# Patient Record
Sex: Male | Born: 1955 | Race: White | Hispanic: No | Smoking: Current every day smoker
Health system: Southern US, Community
[De-identification: ages and names within clinical notes are randomized; demographics above are authoritative.]

## PROBLEM LIST (undated history)

## (undated) DIAGNOSIS — E7800 Pure hypercholesterolemia, unspecified: Secondary | ICD-10-CM

## (undated) DIAGNOSIS — R768 Other specified abnormal immunological findings in serum: Secondary | ICD-10-CM

## (undated) DIAGNOSIS — I480 Paroxysmal atrial fibrillation: Secondary | ICD-10-CM

## (undated) DIAGNOSIS — I2510 Atherosclerotic heart disease of native coronary artery without angina pectoris: Secondary | ICD-10-CM

## (undated) DIAGNOSIS — F1010 Alcohol abuse, uncomplicated: Secondary | ICD-10-CM

## (undated) DIAGNOSIS — R0603 Acute respiratory distress: Secondary | ICD-10-CM

## (undated) DIAGNOSIS — F1410 Cocaine abuse, uncomplicated: Secondary | ICD-10-CM

## (undated) DIAGNOSIS — Z720 Tobacco use: Secondary | ICD-10-CM

## (undated) DIAGNOSIS — I4891 Unspecified atrial fibrillation: Secondary | ICD-10-CM

## (undated) DIAGNOSIS — I10 Essential (primary) hypertension: Secondary | ICD-10-CM

## (undated) DIAGNOSIS — E119 Type 2 diabetes mellitus without complications: Secondary | ICD-10-CM

## (undated) HISTORY — DX: Atherosclerotic heart disease of native coronary artery without angina pectoris: I25.10

## (undated) HISTORY — PX: APPENDECTOMY: SHX54

## (undated) HISTORY — DX: Pure hypercholesterolemia, unspecified: E78.00

## (undated) HISTORY — DX: Paroxysmal atrial fibrillation: I48.0

## (undated) HISTORY — DX: Type 2 diabetes mellitus without complications: E11.9

## (undated) HISTORY — DX: Essential (primary) hypertension: I10

## (undated) HISTORY — PX: FOOT SURGERY: SHX648

---

## 1998-11-18 ENCOUNTER — Emergency Department (HOSPITAL_COMMUNITY): Admission: EM | Admit: 1998-11-18 | Discharge: 1998-11-18 | Payer: Self-pay | Admitting: Emergency Medicine

## 1998-11-18 ENCOUNTER — Encounter: Payer: Self-pay | Admitting: Emergency Medicine

## 1998-11-28 ENCOUNTER — Emergency Department (HOSPITAL_COMMUNITY): Admission: EM | Admit: 1998-11-28 | Discharge: 1998-11-28 | Payer: Self-pay | Admitting: Emergency Medicine

## 2002-08-26 ENCOUNTER — Emergency Department (HOSPITAL_COMMUNITY): Admission: EM | Admit: 2002-08-26 | Discharge: 2002-08-26 | Payer: Self-pay | Admitting: Emergency Medicine

## 2002-08-26 ENCOUNTER — Encounter: Payer: Self-pay | Admitting: Emergency Medicine

## 2004-05-20 ENCOUNTER — Emergency Department (HOSPITAL_COMMUNITY): Admission: EM | Admit: 2004-05-20 | Discharge: 2004-05-20 | Payer: Self-pay | Admitting: Emergency Medicine

## 2004-11-29 ENCOUNTER — Emergency Department (HOSPITAL_COMMUNITY): Admission: EM | Admit: 2004-11-29 | Discharge: 2004-11-29 | Payer: Self-pay | Admitting: Emergency Medicine

## 2006-01-12 ENCOUNTER — Inpatient Hospital Stay (HOSPITAL_COMMUNITY): Admission: AC | Admit: 2006-01-12 | Discharge: 2006-01-15 | Payer: Self-pay

## 2006-11-09 ENCOUNTER — Emergency Department (HOSPITAL_COMMUNITY): Admission: EM | Admit: 2006-11-09 | Discharge: 2006-11-09 | Payer: Self-pay | Admitting: Emergency Medicine

## 2007-01-17 ENCOUNTER — Inpatient Hospital Stay (HOSPITAL_COMMUNITY): Admission: EM | Admit: 2007-01-17 | Discharge: 2007-01-19 | Payer: Self-pay | Admitting: Emergency Medicine

## 2007-01-17 ENCOUNTER — Ambulatory Visit: Payer: Self-pay | Admitting: *Deleted

## 2007-07-20 ENCOUNTER — Emergency Department (HOSPITAL_COMMUNITY): Admission: EM | Admit: 2007-07-20 | Discharge: 2007-07-20 | Payer: Self-pay | Admitting: Emergency Medicine

## 2007-08-28 ENCOUNTER — Emergency Department (HOSPITAL_COMMUNITY): Admission: EM | Admit: 2007-08-28 | Discharge: 2007-08-28 | Payer: Self-pay | Admitting: Emergency Medicine

## 2008-07-27 ENCOUNTER — Emergency Department (HOSPITAL_COMMUNITY): Admission: EM | Admit: 2008-07-27 | Discharge: 2008-07-27 | Payer: Self-pay | Admitting: Emergency Medicine

## 2008-08-29 ENCOUNTER — Encounter: Payer: Self-pay | Admitting: Cardiology

## 2008-10-12 ENCOUNTER — Emergency Department (HOSPITAL_COMMUNITY): Admission: EM | Admit: 2008-10-12 | Discharge: 2008-10-13 | Payer: Self-pay | Admitting: *Deleted

## 2008-10-14 ENCOUNTER — Ambulatory Visit (HOSPITAL_COMMUNITY): Admission: RE | Admit: 2008-10-14 | Discharge: 2008-10-14 | Payer: Self-pay | Admitting: Orthopedic Surgery

## 2008-12-02 ENCOUNTER — Observation Stay (HOSPITAL_COMMUNITY): Admission: EM | Admit: 2008-12-02 | Discharge: 2008-12-03 | Payer: Self-pay | Admitting: Emergency Medicine

## 2008-12-02 ENCOUNTER — Encounter (INDEPENDENT_AMBULATORY_CARE_PROVIDER_SITE_OTHER): Payer: Self-pay | Admitting: Internal Medicine

## 2010-07-07 ENCOUNTER — Emergency Department (HOSPITAL_COMMUNITY): Payer: Medicaid Other

## 2010-07-07 ENCOUNTER — Emergency Department (HOSPITAL_COMMUNITY)
Admission: EM | Admit: 2010-07-07 | Discharge: 2010-07-07 | Disposition: A | Discharge status: Home / Self Care | Payer: Medicaid Other | Attending: Emergency Medicine | Admitting: Emergency Medicine

## 2010-07-07 DIAGNOSIS — F1410 Cocaine abuse, uncomplicated: Secondary | ICD-10-CM | POA: Insufficient documentation

## 2010-07-07 DIAGNOSIS — R059 Cough, unspecified: Secondary | ICD-10-CM | POA: Insufficient documentation

## 2010-07-07 DIAGNOSIS — I10 Essential (primary) hypertension: Secondary | ICD-10-CM | POA: Insufficient documentation

## 2010-07-07 DIAGNOSIS — E7800 Pure hypercholesterolemia, unspecified: Secondary | ICD-10-CM | POA: Insufficient documentation

## 2010-07-07 DIAGNOSIS — I2510 Atherosclerotic heart disease of native coronary artery without angina pectoris: Secondary | ICD-10-CM | POA: Insufficient documentation

## 2010-07-07 DIAGNOSIS — R079 Chest pain, unspecified: Secondary | ICD-10-CM | POA: Insufficient documentation

## 2010-07-07 DIAGNOSIS — F1010 Alcohol abuse, uncomplicated: Secondary | ICD-10-CM | POA: Insufficient documentation

## 2010-07-07 DIAGNOSIS — R05 Cough: Secondary | ICD-10-CM | POA: Insufficient documentation

## 2010-07-07 DIAGNOSIS — F411 Generalized anxiety disorder: Secondary | ICD-10-CM | POA: Insufficient documentation

## 2010-07-07 DIAGNOSIS — F17200 Nicotine dependence, unspecified, uncomplicated: Secondary | ICD-10-CM | POA: Insufficient documentation

## 2010-07-07 LAB — CK TOTAL AND CKMB (NOT AT ARMC): Total CK: 78 U/L (ref 7–232)

## 2010-07-07 LAB — CBC
HCT: 44.8 % (ref 39.0–52.0)
MCH: 33.6 pg (ref 26.0–34.0)
MCHC: 34.2 g/dL (ref 30.0–36.0)
MCV: 98.2 fL (ref 78.0–100.0)
Platelets: 175 10*3/uL (ref 150–400)
RDW: 13.5 % (ref 11.5–15.5)

## 2010-07-07 LAB — COMPREHENSIVE METABOLIC PANEL
ALT: 216 U/L — ABNORMAL HIGH (ref 0–53)
AST: 166 U/L — ABNORMAL HIGH (ref 0–37)
CO2: 23 mEq/L (ref 19–32)
Calcium: 8.4 mg/dL (ref 8.4–10.5)
Creatinine, Ser: 0.9 mg/dL (ref 0.4–1.5)
GFR calc non Af Amer: >60 mL/min (ref >60)
Glucose, Bld: 91 mg/dL (ref 70–99)
Sodium: 133 mEq/L — ABNORMAL LOW (ref 135–145)
Total Protein: 7.6 g/dL (ref 6.0–8.3)

## 2010-07-07 LAB — RAPID URINE DRUG SCREEN, HOSP PERFORMED

## 2010-07-07 LAB — ETHANOL

## 2010-07-28 LAB — BASIC METABOLIC PANEL
BUN: 6 mg/dL (ref 6–23)
Calcium: 8.7 mg/dL (ref 8.4–10.5)
Chloride: 101 mEq/L (ref 96–112)
Chloride: 109 mEq/L (ref 96–112)
Creatinine, Ser: 0.74 mg/dL (ref 0.4–1.5)
GFR calc Af Amer: >60 mL/min (ref >60)
GFR calc non Af Amer: >60 mL/min (ref >60)
GFR calc non Af Amer: >60 mL/min (ref >60)
Potassium: 3.3 mEq/L — ABNORMAL LOW (ref 3.5–5.1)
Sodium: 133 mEq/L — ABNORMAL LOW (ref 135–145)

## 2010-07-28 LAB — DIFFERENTIAL
Eosinophils Relative: 2 % (ref 0–5)
Lymphocytes Relative: 55 % — ABNORMAL HIGH (ref 12–46)
Lymphs Abs: 3.7 10*3/uL (ref 0.7–4.0)
Monocytes Absolute: 0.7 10*3/uL (ref 0.1–1.0)
Monocytes Relative: 11 % (ref 3–12)
Neutro Abs: 2.2 10*3/uL (ref 1.7–7.7)

## 2010-07-28 LAB — HEPATIC FUNCTION PANEL
AST: 190 U/L — ABNORMAL HIGH (ref 0–37)
Albumin: 3.3 g/dL — ABNORMAL LOW (ref 3.5–5.2)
Alkaline Phosphatase: 61 U/L (ref 39–117)
Total Bilirubin: 0.5 mg/dL (ref 0.3–1.2)

## 2010-07-28 LAB — CARDIAC PANEL(CRET KIN+CKTOT+MB+TROPI)
CK, MB: 0.7 ng/mL (ref 0.3–4.0)
CK, MB: 1 ng/mL (ref 0.3–4.0)
Relative Index: INVALID (ref 0.0–2.5)
Total CK: 54 U/L (ref 7–232)
Total CK: 68 U/L (ref 7–232)
Troponin I: 0.01 ng/mL (ref 0.00–0.06)

## 2010-07-28 LAB — LIPID PANEL
HDL: 24 mg/dL — ABNORMAL LOW (ref >39)
Triglycerides: 70 mg/dL (ref <150)
VLDL: 14 mg/dL (ref 0–40)

## 2010-07-28 LAB — HEPATITIS A ANTIBODY, IGM: Hep A IgM: NEGATIVE

## 2010-07-28 LAB — POCT CARDIAC MARKERS: Troponin i, poc: <0.05 ng/mL (ref 0.00–0.09)

## 2010-07-28 LAB — CBC
HCT: 38.3 % — ABNORMAL LOW (ref 39.0–52.0)
Hemoglobin: 13 g/dL (ref 13.0–17.0)
RBC: 3.81 MIL/uL — ABNORMAL LOW (ref 4.22–5.81)

## 2010-07-28 LAB — TSH: TSH: 1.834 u[IU]/mL (ref 0.350–4.500)

## 2010-07-28 LAB — RAPID URINE DRUG SCREEN, HOSP PERFORMED: Cocaine: NOT DETECTED

## 2010-07-28 LAB — HEPATITIS B SURFACE ANTIBODY,QUALITATIVE: Hep B S Ab: NEGATIVE

## 2010-07-30 LAB — BASIC METABOLIC PANEL
BUN: 11 mg/dL (ref 6–23)
CO2: 23 mEq/L (ref 19–32)
Calcium: 9.2 mg/dL (ref 8.4–10.5)
Chloride: 110 mEq/L (ref 96–112)
Creatinine, Ser: 0.7 mg/dL (ref 0.4–1.5)
GFR calc Af Amer: >60 mL/min (ref >60)
GFR calc non Af Amer: >60 mL/min (ref >60)
Glucose, Bld: 110 mg/dL — ABNORMAL HIGH (ref 70–99)
Potassium: 4.2 mEq/L (ref 3.5–5.1)
Sodium: 139 mEq/L (ref 135–145)

## 2010-07-30 LAB — CBC
HCT: 40.1 % (ref 39.0–52.0)
Hemoglobin: 13.6 g/dL (ref 13.0–17.0)
MCHC: 34 g/dL (ref 30.0–36.0)
MCV: 99.5 fL (ref 78.0–100.0)
Platelets: 144 10*3/uL — ABNORMAL LOW (ref 150–400)
RBC: 4.03 MIL/uL — ABNORMAL LOW (ref 4.22–5.81)
RDW: 12.5 % (ref 11.5–15.5)
WBC: 6.1 10*3/uL (ref 4.0–10.5)

## 2010-08-01 LAB — CBC
Hemoglobin: 14.4 g/dL (ref 13.0–17.0)
MCHC: 35 g/dL (ref 30.0–36.0)
MCV: 99.9 fL (ref 78.0–100.0)
RBC: 4.13 MIL/uL — ABNORMAL LOW (ref 4.22–5.81)
WBC: 4.4 10*3/uL (ref 4.0–10.5)

## 2010-08-01 LAB — POCT CARDIAC MARKERS
CKMB, poc: <1 ng/mL — ABNORMAL LOW (ref 1.0–8.0)
CKMB, poc: <1 ng/mL — ABNORMAL LOW (ref 1.0–8.0)
Myoglobin, poc: 30.6 ng/mL (ref 12–200)

## 2010-08-01 LAB — DIFFERENTIAL
Basophils Relative: 1 % (ref 0–1)
Eosinophils Absolute: 0.1 10*3/uL (ref 0.0–0.7)
Lymphs Abs: 2.2 10*3/uL (ref 0.7–4.0)
Monocytes Absolute: 0.4 10*3/uL (ref 0.1–1.0)
Monocytes Relative: 10 % (ref 3–12)

## 2010-09-04 NOTE — H&P (Signed)
NAMEWILMAR, Kevin Orozco              ACCOUNT NO.:  [id number]   MEDICAL RECORD NO.:  [id number]          PATIENT TYPE:  INP   LOCATION:  3708                         FACILITY:  [facility]   PHYSICIAN:  Rod Holler, MD     DATE OF BIRTH:  [birth year]   DATE OF ADMISSION:  01/17/2007  DATE OF DISCHARGE:                              HISTORY & PHYSICAL   CHIEF COMPLAINT:  Chest pain.   HISTORY OF PRESENT ILLNESS:  [name] is a 55 year old male who does  not see a doctor on a regular basis, who presents to the emergency  department with chest pain.  Last night at approximately 2 a.m., the  patient had onset of left-sided chest pain and pressure.  He has had  intermittent discomfort since that time.  There is associated nausea,  diaphoresis, shortness of breath, and radiation to his jaw and left  upper extremity.  He has had no syncope or presyncope but does complain  of some palpitations.  He has no complaints of PND or orthopnea, no  lower extremity swelling.  He was seen in the emergency department a  couple of months ago with similar symptoms but left the hospital AMA.  Currently, he is chest pain-free.   PAST MEDICAL HISTORY:  None.   MEDICATIONS:  None.   ALLERGIES:  NONE.   SOCIAL HISTORY:  The patient smokes 2-3 packs per day and drinks a  couple of 40-ounce beers per day and works in [employer].   FAMILY HISTORY:  Father died in his 31s of an MI.  Mother died in her  86s from complications of diabetes mellitus.   REVIEW OF SYSTEMS:  All systems reviewed in detail and are negative  except as noted in the history of present illness.   PHYSICAL EXAMINATION:  VITAL SIGNS:  Temperature 97.9, heart rate 93,  respiratory rate 20, blood pressure 128/78, oxygen saturation 100%.  GENERAL:  Well-developed, well-nourished male, alert and oriented x3, no  apparent distress.  HEENT: Atraumatic, normocephalic.  Pupils equal, round, and reactive to  light.  Extraocular movements  intact.  Oropharynx clear.  NECK:  Supple.  No adenopathy, no JVD, no carotid bruits.  CHEST:  Lungs clear to auscultation bilaterally with equal breath  sounds.  CARDIAC:  Regular rhythm, normal rate, normal S1-S2.  No murmurs, rubs  or gallops.  Distant heart sounds.  ABDOMEN:  Soft, nontender, nondistended.  Active bowel sounds.  No  splenomegaly.  EXTREMITIES:  No cyanosis or edema, mild clubbing.  NEUROLOGIC:  No focal deficits.   LABORATORY DATA:  Sodium 139, potassium 3.9, chloride 107, bicarb 18,  BUN 8, creatinine 0.7, glucose 89.  White blood cell count 7.8,  hematocrit 40.8, platelet count 158.  Myoglobin 47, 40; troponin less  than 0.05, less than 0.05; CK-MB less than one, less than one; INR 152,  29;  magnesium 2.1.   EKG shows normal sinus rhythm, possible left atrial enlargement, lateral  T wave inversion that is old, no change from previous.  Chest x-ray  shows mild cardiomegaly.   IMPRESSION:  [name] is a 55 year old  male who does not see a  physician on a regular basis who presents with chest pain.   PLAN:  1. Cardiovascular.  Admit the patient to a telemetry bed, aspirin      daily, Lipitor daily, lisinopril daily, Lopressor b.i.d., continue      nitroglycerin drip, heparin bolus and drip, daily EKGs, rule out      with serial cardiac enzymes, BNP in the morning, lipid panel in the      morning.  2. Will place the patient on alcohol withdrawal prophylaxis protocol.  3. Fluids, electrolytes, nutrition:  Cardiac diet, CMP and magnesium      in the morning.  4. Endocrine.  Thyroid function tests in the morning.  5. Hematologic.  CBC in the morning, guaiac all stools.  6. GI:  Place the patient on a PPI.  7. Tobacco cessation consult.      Rod Holler, MD  Electronically Signed     TRK/MEDQ  D:  01/17/2007  T:  01/18/2007  Job:  [id number]
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
## 2010-09-04 NOTE — Discharge Summary (Signed)
Kevin Orozco, Kevin Orozco NO.:  [id number]   MEDICAL RECORD NO.:  [id number]          PATIENT TYPE:  OBV   LOCATION:  2028                         FACILITY:  [facility]   PHYSICIAN:  Isidor Holts, M.D.  DATE OF BIRTH:  05/01/1955   DATE OF ADMISSION:  12/02/2008  DATE OF DISCHARGE:  12/03/2008                               DISCHARGE SUMMARY   PRIMARY CARE PHYSICIAN:  Dr. Ardyth Harps, [place], [place].   PRIMARY CARDIOLOGIST:  Dr. Peter Swaziland.   DISCHARGE DIAGNOSES:  1. Chest pain, atypical.  2. History of nonobstructive coronary artery disease, per cardiac      catheterization 12/2006.  3. Status post negative stress Myoview 08/2008.  4. Hypertension.  5. Dyslipidemia.  6. Smoking history.  7. History of alcohol excess.   DISCHARGE MEDICATIONS:  1. Simvastatin 10 mg p.o. q.h.s.  2. Ambient 10 mg p.o. p.r.n. q.h.s. for insomnia.  3. Klonopine 0.5 mg p.o. p.r.n. b.i.d.  4. Nitroglycerin 0.4 mg sublingually p.r.n. q. 5 minutes for chest      pain.  5. Prilosec 20 mg p.o. daily.  6. Lopressor 12.5 mg p.o. t.i.d.  7. Thiamine 100 mg p.o. daily.   Note:  Lisinopril has been discontinued, until reevaluated by primary  MD.   PROCEDURES:  1. Chest x-ray done December 02, 2008.  This showed no acute      cardiopulmonary process.  2. 2-D echocardiogram done December 02, 2008.  This showed normal left      ventricular cavity size, wall thickness was increased in a pattern      of moderate left ventricular hypertrophy, systolic function was      normal.  Estimated ejection fraction was in the range of 60% to      65%.  Wall motion was normal.  There were no regional wall motion      abnormalities.  Left ventricular diastolic function parameters were      normal.  There was mild mitral valvular regurgitation.  The left      atrium was mildly dilated.   CONSULTATIONS:  Dr. Peter Swaziland, cardiologist.   /ADMISSION HISTORY/>  As in H and P notes of  December 02, 2008, dictated by Dr. Vania Rea.  However, in brief, this is a 55 year old male, with known  history of hypertension, dyslipidemia, nonobstructive coronary artery  disease documented on cardiac catheterization January 18, 2009 by Dr.  Arvilla Meres, which showed a 40-50% lesion in the ostial LAD, 30%  lesion, mid LAD as well as 30% lesion in second marginal branch, also  status post negative stress Myoview done 08/2008 by Dr. Peter Swaziland,  smoking history, and history of alcohol excess, presenting with at least  two episodes of chest pain, described as left-sided, also radiating to  the jaw, partially relieved by sublingual Nitroglycerin.  According to  him, these episodes were associated with shortness of breath.  He was  admitted for further evaluation, investigation and management.   CLINICAL COURSE:  1. Chest pain.  This had some atypical features, however, the      patient's description was  suspicious for coronary artery disease.      Cardiac enzymes were cycled and remained unelevated.  A 12-lead EKG      showed no acute ischemic changes.  2-D echocardiogram was done,      which showed no evidence of regional wall motion abnormalities.      Cardiology consultation was called, which was kindly provided by      Dr. Peter Swaziland.  For details of the consultation, refer to      consultation notes of December 02, 2008.  After reviewing the data,      he felt that there was no objective evidence of ischemia and that      the patient's chest pain was likely noncardiac.  He  recommended      utilization of a proton pump inhibitor.  This was instituted      accordingly, and throughout the rest of the course of his      hospitalization, the patient had no relapse of chest pain.   1. Dyslipidemia.  The patient's lipid profile was as follows:  Total      cholesterol 76, triglyceride 70, HDL 24, LDL 38 i.e. excellent      lipid profile.  He has been reassured  accordingly.   1. Hypertension.  The patient was normotensive throughout the course      of his hospitalization.  However, in view of presenting symptoms he      was placed on a low-dose beta blocker.  Lisinopril has been      discontinued, until reevaluated by his primary MD.   1. Smoking history.  The patient does smoke approximately 1 to 1-1/2      packets of cigarettes per day.  He had been counseled      appropriately.  He was managed during the course of this      hospitalization, with Nicoderm CQ patch.   1. Alcohol excess.  The patient states that he drinks only a couple of      beers per day, however, at the time of presentation, his alcohol      level was 42, suggestive of acute intoxication.  He was also noted      to have a red cell macrocytosis of 100.6 and a mild transaminitis      with alkaline phosphatase 61, AST 190, ALT 183.  These phenomena      were deemed likely secondary to alcohol abuse.  Viral hepatitis      serologies were done, and were negative.  The patient has been      counseled appropriately.  However, during the course of his      hospitalization, he showed no evidence of alcohol withdrawal      phenomena.   DISPOSITION:  The patient was on December 03, 2008 asymptomatic and very  keen to be discharged.  There were no new issues.  He was therefore  considered clinically stable for discharge, and discharged accordingly.   DIET:  Heart-healthy.   ACTIVITY:  As tolerated.   FOLLOW-UP INSTRUCTIONS:  The patient is to follow up routinely with his  primary MD, Dr. Ardyth Harps, in [place], [place],  telephone number [phone].  An appointment has been made for December 12, 2008 at 10:15 a.m.      Isidor Holts, M.D.  Electronically Signed     CO/MEDQ  D:  12/03/2008  T:  12/03/2008  Job:  [id number]   cc:   Peter M. Swaziland, M.D.  Ardyth Harps, M.D.

## 2010-09-04 NOTE — Discharge Summary (Signed)
NAMECAPRICE, Kevin Orozco              ACCOUNT NO.:  [id number]   MEDICAL RECORD NO.:  [id number]          PATIENT TYPE:  INP   LOCATION:  3708                         FACILITY:  [facility]   PHYSICIAN:  Bevelyn Buckles. Bensimhon, MDDATE OF BIRTH:  03-07-1956   DATE OF ADMISSION:  01/17/2007  DATE OF DISCHARGE:  01/19/2007                               DISCHARGE SUMMARY   PRIMARY CARDIOLOGIST:  Dr. Nona Dell   PRIMARY CARE Charvi Gammage:  Patient does not have one   DISCHARGE DIAGNOSIS:  Chest pain.   SECONDARY DIAGNOSES:  1. Nonobstructive coronary artery disease.  2. Ongoing tobacco abuse.  3. ETOH abuse.  4. Family history of coronary artery disease.  5. Elevated liver function enzymes.   ALLERGIES:  No known drug allergies.   PROCEDURES:  Left heart cardiac catheterization.   HISTORY OF PRESENT ILLNESS:  A 55 year old Caucasian male without prior  medical history who does not see a physician on a regular basis.  At  2:00 a.m. on September 27, he had sudden onset of left-sided chest pain  and pressure which remained intermittent the remainder of the morning  and following day.  Because of ongoing symptoms, he presented to the  [facility] ED where ECG showed no acute changes and cardiac markers were  negative.  Symptoms were felt to be concerning for unstable angina and  he was admitted for further evaluation.   HOSPITAL COURSE:  Patient ruled out for MI.  He was initially placed on  statin therapy, however this was discontinued after his LDL was noted to  be normal at 68 with an elevated AST and ALT of 179 and 203,  respectively.  This was felt to be most likely secondary to ETOH abuse.  He was counseled on the importance of tobacco and alcohol cessation and  was also given Nicotine patch and DT prophylaxis.  Decision was made to  pursue cardiac catheterization which took place this morning revealing a  40 to 50% stenosis in the proximal LAD and otherwise nonobstructive  disease.  EF  was 65%.  He will be discharged home this afternoon on low-  dose aspirin and proton pump inhibitor therapy.  We recommended that he  establish followup with primary care locally and GI followup as well.   DISCHARGE LABS:  Hemoglobin 13.3, hematocrit 38.6, WBC 6.3, platelets  134, MCV 100.7.  Sodium 137, potassium 3.6, chloride 107, CO2 26, BUN 9,  creatinine 0.83, glucose 95.  Total bilirubin 1.0, alkaline phosphatase  50, AST 121, ALT 167, albumin 3.2.  CK 67, MB 1.0, troponin I 0.02.  Calcium 8.5, magnesium 1.9.  BNP less than 30.  TSH 1.058.   DISPOSITION:  Patient is being discharged home today in good condition.   FOLLOWUP PLANS AND APPOINTMENTS:  He is asked to establish primary care  as well as GI followup.   DISCHARGE MEDICATIONS:  1. Aspirin 81 mg q.d.  2. Prilosec OTC 20 mg b.i.d.   Patient has been counseled on the importance of cessation of all tobacco  and alcohol.   OUTSTANDING LABS/STUDIES:  None.   Duration of discharge  encounter 35 minutes including physician time.      Nicolasa Ducking, ANP      Bevelyn Buckles. Bensimhon, MD  Electronically Signed    CB/MEDQ  D:  01/19/2007  T:  01/19/2007  Job:  [id number]

## 2010-09-04 NOTE — H&P (Signed)
NAMEMarland Kitchen  Kevin Orozco, Kevin Orozco NO.:  [id number]   MEDICAL RECORD NO.:  [id number]          PATIENT TYPE:  OBV   LOCATION:  2028                         FACILITY:  [facility]   PHYSICIAN:  Vania Rea, M.D. DATE OF BIRTH:  October 30, 1955   DATE OF ADMISSION:  12/02/2008  DATE OF DISCHARGE:                              HISTORY & PHYSICAL   PRIMARY CARE PHYSICIAN:  Dr. Tomi Bamberger in [place].   CARDIOLOGIST:  Dr. Diona Browner with [facility] cardiology.   CHIEF COMPLAINT:  Chest pain.   HISTORY OF PRESENT ILLNESS:  This is a 55 year old Caucasian gentleman  with a history of tobacco and alcohol abuse and apparently a history of  hyperlipidemia who comes in complaining of chest pain.  The patient says  he has been having a similar chest pain episodically and in fact was  seen at this facility in September 2008, for a similar chest pain and at  that time was subjected to a cardiac catheterization which revealed  nonobstructive coronary disease, about 40-50% calcific stenosis of the  ostium of LAD with normal ejection fraction.  It was felt that his heart  was not the cause of his chest pains.  Since then, the patient has been  noncompliant with follow-up with a cardiologist, but has been seeing a  doctor in [place].  Currently, he is maintained on lisinopril and  simvastatin, as well as p.r.n. nitroglycerin.  The patient says he  started having central chest pressure yesterday afternoon.  It radiated  up into his jaw and the left side of his neck which became numb and was  partially relieved by sublingual nitroglycerin.  He came to the  emergency room and so far has received aspirin and has been resting most  of the time and says the pain is not gone completely, but it is very  mild.  The pain was associated with sweating and nausea, but he did not  get dizzy.  He was not syncopal.  He was not short of breath.  He has  been having no fever, cough or cold.  No lower extremity  edema.   PAST MEDICAL HISTORY:  1. Hypertension.  2. Hyperlipidemia.  3. Recurrent chest pains.  4. Nonobstructive coronary disease.  5. Tobacco abuse.  6. Alcohol abuse.   MEDICATIONS:  1. Lisinopril 10 mg daily.  2. Clonazepam 0.5 mg twice daily when necessary.  3. Ambien 10 mg at bedtime when necessary.  4. Simvastatin 10 mg at bedtime.  5. Nitroglycerin 0.4 mg sublingually p.r.n.   ALLERGIES:  NO KNOWN DRUG ALLERGIES.   SOCIAL HISTORY:  He smokes 1-1-1/2 packs of cigarettes per day.  He  drinks approximately two beers per day, he says his last alcohol use was  the day before yesterday.  He denies illicit drug use.   FAMILY HISTORY:  Significant for a father who died in his 31s from acute  MI and mother who died in her 33s from complications of diabetes.   REVIEW OF SYSTEMS:  On a 10-point review of systems other than noted  above was unremarkable.   PHYSICAL EXAMINATION:  GENERAL:  A drowsy middle-aged Caucasian man  lying flat on the stretcher in no acute distress.  VITAL SIGNS:  His  temperature is 98.7, pulse 85, respirations 19, blood pressure 98/56.  He is saturating at 99% on room air.  HEENT:  His pupils are round and equal.  Mucous membranes pink and  anicteric.  NECK:  He has no cervical lymphadenopathy or thyromegaly.  He has marked  rhinophyma and his face is somewhat ruddy.  CHEST:  Clear to auscultation bilaterally.  CARDIOVASCULAR:  Regular rhythm without murmur.  ABDOMEN:  Mildly obese.  Soft and nontender.  There are no masses.  He  does have reproducible chest wall tenderness.  EXTREMITIES:  Without edema and no bony joint deformity.  SKIN:  Warm and dry.  He has no ulcerations.  CENTRAL NERVOUS SYSTEM:  Cranial nerves II-XII are grossly intact and he  has no focal neurologic deficit.  His gait was not tested, but the  patient has been able to ambulate to the restroom without difficulty.   LABORATORY DATA:  His CBC is reviewed.  His white count is  6.9,  hemoglobin 13.0, MCV 100.6, platelets 169.  His differential is  remarkable for 55% lymphocytes.  Serum chemistry; sodium is 133,  potassium 3.3, chloride 101, CO2 of 22, glucose 96, BUN 6, creatinine  0.69, calcium 9.1.  There are no liver functions at this time.  Cardiac  markers; he has undetectable troponins and the myoglobin is only 41.  A  two-view chest x-ray shows no acute cardiopulmonary process.  The EKG  shows sinus rhythm with a questionable ST depression.   ASSESSMENT:  1. Chest pain relieved by sublingual nitroglycerin.  2. History of hypertension with borderline hypotension.  3. History of hyperlipidemia.  4. History of nonobstructive coronary disease.  5. Tobacco abuse.  6. History of alcohol abuse.   PLAN:  We will bring this gentleman for serial cardiac enzymes and  serial EKGs.  We will check his lipid profile and thyroid function  studies.  We will also check his liver function tests and serum blood  alcohol level.  Other plans as per orders.      Vania Rea, M.D.  Electronically Signed     LC/MEDQ  D:  12/02/2008  T:  12/02/2008  Job:  [id number]   cc:   Tomi Bamberger, MD

## 2010-09-04 NOTE — Consult Note (Signed)
NAME:  Kevin Orozco, Kevin Orozco NO.:  [id number]   MEDICAL RECORD NO.:  [id number]          PATIENT TYPE:  OBV   LOCATION:  2028                         FACILITY:  [facility]   PHYSICIAN:  Peter M. Swaziland, M.D.  DATE OF BIRTH:  Sep 01, 1955   DATE OF CONSULTATION:  12/02/2008  DATE OF DISCHARGE:                                 CONSULTATION   REFERRING PHYSICIAN:  Isidor Holts, MD   HISTORY OF PRESENT ILLNESS:  Mr. Cull is a 55 year old white male  that I am seeing at the request of Dr. Brien Few for evaluation of chest pain.  The patient has a history of chronic intermittent chest pain for over 2  years.  He has had extensive evaluation including cardiac  catheterization in September 2008, which showed nonobstructive coronary  artery disease with a 40-50% ostial LAD stenosis.  He subsequently had a  stress nuclear study in May of this year, which was normal.  He does  have a history of tobacco and alcohol abuse.  He has a prior history of  cocaine use, but denies this currently.  He does have a history of  diabetes, hypertension, and hypercholesterolemia.  Last night, he was in  [place] after being [place] a warrant [place]assing [place].  He was upset.  He had been drinking yesterday.  He complained of  numbness under his chin radiating into his chest.  He developed a sharp  stabbing pain in his mid chest and radiating into his left shoulder.  It  was associated with nausea.  He had no vomiting.  There was no shortness  of breath.  He states the pain is similar to the pain he has had over  the past 2 years.   PAST MEDICAL HISTORY:  1. Diabetes mellitus type 2.  2. Nonobstructive coronary artery disease.  3. Hypertension.  4. Hypercholesterolemia.   PRIOR SURGERIES:  1. Appendectomy.  2. Left foot reconstruction surgery.   CURRENT MEDICATIONS:  1. Lisinopril 10 mg per day.  2. Simvastatin 10 mg per day.  3. Klonopin 0.5 mg b.i.d. p.r.n.  4. Ambien p.r.n.  5. Nitroglycerin p.r.n.   SOCIAL HISTORY:  The patient is divorced.  He has 1 daughter.  He has  been a smoker for 35 plus years, and currently smoking 1-2 packs per  day.  He does have a history of alcohol abuse.  He is unemployed and  lives in a hotel room.   FAMILY HISTORY:  Father died at age 101 with myocardial infarction.  Mother died at age 83 with complication of diabetes.  He has 3 siblings.  One sibling has diabetes.   REVIEW OF SYSTEMS:  He has no history of stroke or TIA.  He denies any  edema or cough.  He has had no fever or chills.  Bowel movements have  been normal.  His appetite has been good.  He has no history of bleeding  trouble.  All other systems are reviewed and are negative.   PHYSICAL EXAMINATION:  GENERAL:  He is middle-aged white male, in no  apparent distress.  VITAL  SIGNS:  Blood pressure is 99/64, pulse is 62, and afebrile.  His  sats are 98% on room air.  Respirations are normal.  HEENT:  Normocephalic and atraumatic.  His skin is somewhat flushed.  His pupils are equal, round, and reactive.  Sclerae clear.  Oropharynx  reveals poor dentition.  LUNGS:  Clear.  CARDIAC:  Regular rate and rhythm.  He has a soft 1/6 systolic murmur at  apex.  ABDOMEN:  Soft and nontender without hepatosplenomegaly, masses, or  bruits.  Bowel sounds are positive.  EXTREMITIES:  Without edema or cyanosis.  Pedal pulses were 2+ and  symmetric.  SKIN:  Warm and dry.  NEUROLOGIC:  He is oriented x3.  Cranial nerves II through XII are  intact.  MUSCULOSKELETAL:  Grossly normal.   LABORATORY DATA:  ECG shows normal sinus rhythm with T-wave inversion in  lateral leads.  This is old.  Chest x-ray shows no active disease.  Point-of-care cardiac enzymes were negative.  Subsequent cardiac enzymes  have been negative x2.  Alcohol level was 42, AST was elevated at 190  with an ALT of 183, cholesterol 76, triglycerides 70, LDL of 38, and HDL  of 24.  White count 6900, hemoglobin  13, hematocrit 38.3, and platelets  169,000.  Sodium is 133, potassium 3.3, chloride 101, CO2 of 22 BUN 6,  creatinine 0.69, and glucose of 96.  Thyroid studies were normal.   IMPRESSION:  1. Chest pain, noncardiac.  There is no objective evidence of      ischemia.  The patient has had chronic chest pain with negative      cardiac evaluation in the past including cardiac catheterization 2      years ago and more recent Cardiolite study in May.  His      echocardiogram today demonstrates moderate left ventricular      hypertrophy with normal systolic function, ejection fraction of      65%.  There was mild mitral and tricuspid insufficiency.  I feel      that his symptoms were probably related to increased stressors      recently along with alcohol abuse with resultant esophagitis and/or      gastritis.  2. Alcohol abuse.  3. Tobacco abuse.  4. Diabetes mellitus.  5. Hypertension.  6. Dyslipidemia.   PLAN:  I do not feel that any further cardiac workup is indicated at  this point, would recommend alcohol and tobacco cessation.  We will  continue with proton pump inhibitor and would consider GI evaluation if  his symptoms persist.           ______________________________  Peter M. Swaziland, M.D.     PMJ/MEDQ  D:  12/02/2008  T:  12/03/2008  Job:  [id number]   cc:   Isidor Holts, M.D.  Suezanne Jacquet Toni Arthurs, NP

## 2010-09-04 NOTE — Cardiovascular Report (Signed)
Kevin Orozco, Kevin Orozco              ACCOUNT NO.:  [id number]   MEDICAL RECORD NO.:  [id number]          PATIENT TYPE:  INP   LOCATION:  3708                         FACILITY:  [facility]   PHYSICIAN:  Bevelyn Buckles. Bensimhon, MDDATE OF BIRTH:  30-Aug-1955   DATE OF PROCEDURE:  01/19/2007  DATE OF DISCHARGE:                            CARDIAC CATHETERIZATION   PRIMARY CARE PHYSICIAN:  None.   PATIENT IDENTIFICATION:  [name] a 55 year old male with history of  heavy ongoing tobacco and alcohol use.  Denies any known coronary artery  disease.  He was admitted with progressive chest pain concerning for  unstable angina.  He is thus referred for diagnostic angiography.  EKG  had nonspecific changes, and cardiac enzymes were normal.   PROCEDURES PERFORMED:  1. Selective coronary angiography.  2. Left heart cath.  3. Left ventriculogram.  4. Aortic root shot.  5. Femoral artery Angio-Seal.   DESCRIPTION OF PROCEDURE:  The risks and indications of the  catheterization were explained.  Consent was signed and placed on the  chart.  A 6-French arterial sheath was placed in the right femoral  artery, using a modified Seldinger technique.  His aortic root was  mildly dilated.  We used a JL-5 catheter and to engage the left coronary  system.  A JR-4 was used for the right coronary system and angled  pigtail weight was used for the ventriculogram.  All catheter exchanges  were made over wire.  There no apparent complications.  At the end the  procedure, the right femoral arteriotomy site was closed with an Angio-  Seal closure device.  There was good hemostasis.   Central aortic pressure 116/80 with a mean of 97.  LV pressure 112/0  with EDP of 12.  There was no aortic stenosis.   Left main was normal.   LAD was a long vessel coursing to the apex, gave off three diagonal  branches.  In the ostial portion of the LAD, there was a tubular  calcified 40-50% lesion.  Initially, it appeared to be a  possible  staining.  However, this turned out to be a small branch vessel.  In the  mid-LAD, there is a 30% lesion.   Left circumflex was a moderate-sized system.  It gave off two large  branching marginal branches and a moderate-size posterolateral.  There  was a 30% lesion in the second marginal branch.   Right coronary artery was a dominant vessel, gave off a PDA and a  posterolateral branch.  There is a 20% lesion in the mid-section.   Left ventriculogram done in the RAO position showed an EF of 65%.  There  were no regional wall motion abnormalities or significant mitral  regurgitation.   Aortic root shot showed a mildly dilated aortic root, without any  evidence of proximal dissection.   ASSESSMENT:  1. Moderate nonobstructive coronary artery disease with a 40% to 50%      calcific calcified ostial LAD lesion, as described above.  2. Normal left ventricular function.  3. Mildly-dilated aortic root.   PLAN/DISCUSSION:  I suspect his chest pain is noncardiac,  possibly due  to peptic ulcer disease or gastritis, given his heavy alcohol use.  I  feel comfortable that we can discharge him home today, with an  outpatient GI followup, on high-dose proton pump inhibitor, specifically  Prilosec 20 b.i.d.  He will need aggressive risk factor management for  his existing coronary artery disease.  To prevent the progression of his  LAD disease in particular.  He will follow up with Dr. Diona Browner.  I did  have a discussion with him about smoking cessation.      Bevelyn Buckles. Bensimhon, MD  Electronically Signed     DRB/MEDQ  D:  01/19/2007  T:  01/19/2007  Job:  [id number]

## 2010-09-07 NOTE — Assessment & Plan Note (Signed)
[facility]                                 ON-CALL NOTE   TSUTOMU, BARFOOT                       MRN:          [id number]  DATE:06/23/2007                            DOB:          1956/01/28    It is noted that the patient has 2 medical record numbers.  They are as  follows [id number] and [id number].   DATE OF CALL RECEIVED:  June 22, 2007 at approximately 6:30.   PRIMARY CARDIOLOGIST:  Dr. Diona Browner.   Last seen in September 2008.   BRIEF HISTORY:  Mr. Doom is a 55 year old male who calls this evening  stating he is having chest discomfort.  He states that this is the same  discomfort that he had when he had his heart attack in September 2008.  The discomfort has been ongoing all day and he is wondering what to do.   On reviewing the chart, during his hospital admission on September 2008,  the patient did present with chest discomfort.  He did not have evidence  of myocardial infarction.  He did undergo cardiac catheterization at  that time that showed nonobstructive coronary artery disease with an EF  of 65%.  He was instructed at the time of discharge to follow up with  his primary care physician as well as recommended GI follow up.  He was  given a prescription for Prilosec.   According to Mr. Huckeby he did not follow up with a primary care  physician or a GI physician.  In fact he did not get his prescriptions  filled.  He has continued smoking and using alcohol against medical  advice since that time.   I informed Mr. Santoli, for evaluation of his chest discomfort, he  should  present to the emergency room for evaluation.  I would be happy  to notify the ER doctor of his pending arrival.  However, I explained  that, based on information in the computer, given his past medical  history, this was probably not be cardiac related chest discomfort.  However, I reinforced that I could not to be 100% sure without further  evaluation.  Once  again I recommended him to proceed to the emergency  room for evaluation.  He wished to see a GI physician if he thought that  his heart was okay.  I again explained to him on Monday evening after  hours, his only course of action would be to present to the emergency  room for further evaluation, which again I recommended.  I further  explained that the ER physician could begin evaluation and contact what  type of physician which was appropriate to further evaluate his chest  discomfort.  The patient was very reluctant to come to the emergency  room.  He then put the down and walked away.  I remained on the line for  at least 5 minutes attempting to get him to pick back up the phone.  Afterwards, I  hung up and I proceeded to attempt to call his house back intermittently  from approximately 7:00 to about 10:00  p.m.  I obtained busy signals the  entire time.      Joellyn Rued, PA-C  Electronically Signed      Jesse Sans. Daleen Squibb, MD, [facility]  Electronically Signed   EW/MedQ  DD: 06/23/2007  DT: 06/23/2007  Job #: [id number]

## 2010-09-07 NOTE — Op Note (Signed)
NAME:  Kevin Orozco, Kevin Orozco NO.:  [id number]   MEDICAL RECORD NO.:  [id number]          PATIENT TYPE:  EMS   LOCATION:  MAJO                         FACILITY:  [facility]   PHYSICIAN:  Nadara Mustard, MD     DATE OF BIRTH:  Jun 06, 1955   DATE OF PROCEDURE:  11/29/2004  DATE OF DISCHARGE:                                 OPERATIVE REPORT   ER CONSULT AND SURGICAL PROCEDURE NOTE:   HISTORY OF PRESENT ILLNESS:  The patient is a 55 year old gentleman who  states he was cutting some table top with a Skil-Saw.  He states that the  saw was off power.  He was reaching for the saw when the spinning blade  caught the tip of his right ring finger. The patient was brought to the  emergency room for evaluation and is seen today in consultation.   ALLERGIES:  No known drug allergies.   MEDICATIONS:  None.   PREVIOUS SURGERIES:  He is had multiple traumas involving both lower  extremities.  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   SOCIAL HISTORY:  Works in [employer].   PHYSICAL EXAMINATION:  Examination of his right upper extremity:  He has a  amputation of the tip of the right ring finger, which goes through the  distal phalanx.  Saw cut approximately through the middle aspect of the  nailbed.  The remainder of his finger is neurovascularly intact with good  bleeding.  The palmar aspect of the saw cut his bleeding and is oblique  pulley cut proximally to the volar side.   ASSESSMENT:  Fingertip amputation, right ring finger.   PLAN:  The patient underwent a digital block with 10 mL of 1% lidocaine  plain.  His finger was then sterilely prepped using Betadine and draped into  a sterile field.  The wound edges were freshened back to viable tissue.  The  segmental nailbed injury was also trimmed back to a viable nailbed and the  exposed bone was cut back with a rongeur.  The patient's volar flap was  advanced distally and the wound was closed using  4-0 nylon.  The wound was  covered with Adaptic orthopedic sponges, Kerlix and a Coban dressing.  The  patient received a tetanus booster as well as IM third generation  cephalosporin injection.  The patient will be discharged to home with  prescription for Vicodin and Keflex.  The patient was given instructions on  elevation and follow-up in the office in two to three days.      Nadara Mustard, MD  Electronically Signed    MVD/MEDQ  D:  11/29/2004  T:  11/30/2004  Job:  [id number]

## 2010-09-29 ENCOUNTER — Emergency Department (HOSPITAL_COMMUNITY)
Admission: EM | Admit: 2010-09-29 | Discharge: 2010-09-30 | Disposition: A | Discharge status: Home / Self Care | Payer: Medicaid Other | Attending: Emergency Medicine | Admitting: Emergency Medicine

## 2010-09-29 ENCOUNTER — Emergency Department (HOSPITAL_COMMUNITY): Payer: Medicaid Other

## 2010-09-29 DIAGNOSIS — IMO0002 Reserved for concepts with insufficient information to code with codable children: Secondary | ICD-10-CM | POA: Insufficient documentation

## 2010-09-29 DIAGNOSIS — S335XXA Sprain of ligaments of lumbar spine, initial encounter: Secondary | ICD-10-CM | POA: Insufficient documentation

## 2010-09-29 DIAGNOSIS — I252 Old myocardial infarction: Secondary | ICD-10-CM | POA: Insufficient documentation

## 2010-09-29 DIAGNOSIS — I2510 Atherosclerotic heart disease of native coronary artery without angina pectoris: Secondary | ICD-10-CM | POA: Insufficient documentation

## 2010-09-29 DIAGNOSIS — I10 Essential (primary) hypertension: Secondary | ICD-10-CM | POA: Insufficient documentation

## 2011-01-31 LAB — I-STAT 8, (EC8 V) (CONVERTED LAB)
Acid-base deficit: 5 — ABNORMAL HIGH
BUN: 8
Bicarbonate: 17.9 — ABNORMAL LOW
HCT: 46
Hemoglobin: 15.6
Operator id: 285491
Sodium: 139
TCO2: 19
pCO2, Ven: 29 — ABNORMAL LOW

## 2011-01-31 LAB — CBC
HCT: 38.6 — ABNORMAL LOW
HCT: 38.7 — ABNORMAL LOW
Hemoglobin: 13.3
Hemoglobin: 13.4
MCHC: 34.4
MCHC: 34.4
MCV: 100
MCV: 100.7 — ABNORMAL HIGH
MCV: 98.7
Platelets: 144 — ABNORMAL LOW
Platelets: 158
RBC: 4.08 — ABNORMAL LOW
RDW: 12.8
RDW: 12.9
WBC: 7.8

## 2011-01-31 LAB — MAGNESIUM
Magnesium: 1.9
Magnesium: 2.1

## 2011-01-31 LAB — COMPREHENSIVE METABOLIC PANEL
Albumin: 3.3 — ABNORMAL LOW
Alkaline Phosphatase: 52
BUN: 6
Creatinine, Ser: 0.84
Glucose, Bld: 94
Potassium: 4.3
Total Bilirubin: 0.8
Total Protein: 6.6

## 2011-01-31 LAB — BASIC METABOLIC PANEL
CO2: 26
Chloride: 107
Glucose, Bld: 95
Potassium: 3.6
Sodium: 137

## 2011-01-31 LAB — TSH: TSH: 1.058

## 2011-01-31 LAB — HEPARIN LEVEL (UNFRACTIONATED)
Heparin Unfractionated: 0.33
Heparin Unfractionated: 1.14 — ABNORMAL HIGH

## 2011-01-31 LAB — CK TOTAL AND CKMB (NOT AT ARMC)
CK, MB: 1.4
Relative Index: INVALID
Total CK: 79

## 2011-01-31 LAB — LIPID PANEL
Cholesterol: 121
HDL: 40
VLDL: 13

## 2011-01-31 LAB — POCT CARDIAC MARKERS
CKMB, poc: <1 — ABNORMAL LOW
Troponin i, poc: <0.05

## 2011-01-31 LAB — HEPATIC FUNCTION PANEL
Bilirubin, Direct: 0.3
Total Bilirubin: 1

## 2011-01-31 LAB — TROPONIN I: Troponin I: 0.02

## 2011-01-31 LAB — CARDIAC PANEL(CRET KIN+CKTOT+MB+TROPI)
Relative Index: INVALID
Troponin I: 0.02

## 2011-01-31 LAB — POCT I-STAT CREATININE
Creatinine, Ser: 0.7
Operator id: 285491

## 2011-01-31 LAB — APTT: aPTT: 29

## 2011-08-08 ENCOUNTER — Telehealth: Payer: Self-pay | Admitting: *Deleted

## 2011-08-08 ENCOUNTER — Telehealth: Payer: Self-pay | Admitting: Cardiology

## 2011-08-08 NOTE — Telephone Encounter (Signed)
Pt called stating he was out of his Klonopine 0.5 MG.  He informed me that his PCP Dr. Toni Arthurs was no longer in practice, and since he had seen Dr. Swaziland in the past could we fill the Rx.  I pulled his only visit -- that from 08/16/2008 along with his hospital discharge summary from 12/02/2008 and went to ask Dr. Swaziland about refill.  Dr. Swaziland said he does not fill Klonopine, plus the pt has not been seen in the office in 3 years.  I explained to the pt we was unable to fill that Rx here at cardiology, we couldn't even fill BP med refills because it has been 3 years.  The pt was very upset,

## 2011-08-08 NOTE — Telephone Encounter (Signed)
Error

## 2012-03-22 DIAGNOSIS — R768 Other specified abnormal immunological findings in serum: Secondary | ICD-10-CM

## 2012-03-22 HISTORY — DX: Other specified abnormal immunological findings in serum: R76.8

## 2012-04-19 ENCOUNTER — Emergency Department (HOSPITAL_COMMUNITY): Payer: Medicaid Other

## 2012-04-19 ENCOUNTER — Inpatient Hospital Stay (HOSPITAL_COMMUNITY)
Admission: EM | Admit: 2012-04-19 | Discharge: 2012-04-24 | DRG: 309 | Disposition: A | Discharge status: Home / Self Care | Payer: Medicaid Other | Attending: Cardiology | Admitting: Cardiology

## 2012-04-19 ENCOUNTER — Encounter (HOSPITAL_COMMUNITY): Payer: Self-pay

## 2012-04-19 DIAGNOSIS — I480 Paroxysmal atrial fibrillation: Secondary | ICD-10-CM | POA: Insufficient documentation

## 2012-04-19 DIAGNOSIS — I200 Unstable angina: Secondary | ICD-10-CM

## 2012-04-19 DIAGNOSIS — R079 Chest pain, unspecified: Secondary | ICD-10-CM

## 2012-04-19 DIAGNOSIS — R768 Other specified abnormal immunological findings in serum: Secondary | ICD-10-CM

## 2012-04-19 DIAGNOSIS — F1410 Cocaine abuse, uncomplicated: Secondary | ICD-10-CM | POA: Diagnosis present

## 2012-04-19 DIAGNOSIS — Z79899 Other long term (current) drug therapy: Secondary | ICD-10-CM

## 2012-04-19 DIAGNOSIS — R7402 Elevation of levels of lactic acid dehydrogenase (LDH): Secondary | ICD-10-CM | POA: Diagnosis present

## 2012-04-19 DIAGNOSIS — I4891 Unspecified atrial fibrillation: Secondary | ICD-10-CM

## 2012-04-19 DIAGNOSIS — E785 Hyperlipidemia, unspecified: Secondary | ICD-10-CM | POA: Diagnosis present

## 2012-04-19 DIAGNOSIS — I10 Essential (primary) hypertension: Secondary | ICD-10-CM | POA: Diagnosis present

## 2012-04-19 DIAGNOSIS — I2510 Atherosclerotic heart disease of native coronary artery without angina pectoris: Secondary | ICD-10-CM | POA: Diagnosis present

## 2012-04-19 DIAGNOSIS — E871 Hypo-osmolality and hyponatremia: Secondary | ICD-10-CM

## 2012-04-19 DIAGNOSIS — F17200 Nicotine dependence, unspecified, uncomplicated: Secondary | ICD-10-CM

## 2012-04-19 DIAGNOSIS — R7989 Other specified abnormal findings of blood chemistry: Secondary | ICD-10-CM | POA: Diagnosis present

## 2012-04-19 DIAGNOSIS — R7401 Elevation of levels of liver transaminase levels: Secondary | ICD-10-CM | POA: Diagnosis present

## 2012-04-19 DIAGNOSIS — F1010 Alcohol abuse, uncomplicated: Secondary | ICD-10-CM | POA: Diagnosis present

## 2012-04-19 DIAGNOSIS — E119 Type 2 diabetes mellitus without complications: Secondary | ICD-10-CM | POA: Diagnosis present

## 2012-04-19 DIAGNOSIS — E7800 Pure hypercholesterolemia, unspecified: Secondary | ICD-10-CM | POA: Diagnosis present

## 2012-04-19 DIAGNOSIS — I201 Angina pectoris with documented spasm: Secondary | ICD-10-CM

## 2012-04-19 HISTORY — DX: Unspecified atrial fibrillation: I48.91

## 2012-04-19 HISTORY — DX: Alcohol abuse, uncomplicated: F10.10

## 2012-04-19 HISTORY — DX: Tobacco use: Z72.0

## 2012-04-19 HISTORY — DX: Other specified abnormal immunological findings in serum: R76.8

## 2012-04-19 HISTORY — DX: Cocaine abuse, uncomplicated: F14.10

## 2012-04-19 LAB — CBC WITH DIFFERENTIAL/PLATELET
Basophils Absolute: 0 10*3/uL (ref 0.0–0.1)
HCT: 42 % (ref 39.0–52.0)
Hemoglobin: 14.8 g/dL (ref 13.0–17.0)
Lymphocytes Relative: 50 % — ABNORMAL HIGH (ref 12–46)
Lymphs Abs: 3.7 10*3/uL (ref 0.7–4.0)
Monocytes Absolute: 0.6 10*3/uL (ref 0.1–1.0)
Monocytes Relative: 8 % (ref 3–12)
Neutro Abs: 3 10*3/uL (ref 1.7–7.7)
RBC: 4.5 MIL/uL (ref 4.22–5.81)
WBC: 7.4 10*3/uL (ref 4.0–10.5)

## 2012-04-19 LAB — COMPREHENSIVE METABOLIC PANEL
AST: 251 U/L — ABNORMAL HIGH (ref 0–37)
CO2: 18 mEq/L — ABNORMAL LOW (ref 19–32)
Chloride: 90 mEq/L — ABNORMAL LOW (ref 96–112)
Creatinine, Ser: 0.71 mg/dL (ref 0.50–1.35)
GFR calc non Af Amer: >90 mL/min (ref >90)
Total Bilirubin: 0.5 mg/dL (ref 0.3–1.2)

## 2012-04-19 LAB — APTT: aPTT: 37 seconds (ref 24–37)

## 2012-04-19 LAB — POCT I-STAT TROPONIN I

## 2012-04-19 MED ORDER — HEPARIN (PORCINE) IN NACL 100-0.45 UNIT/ML-% IJ SOLN
1750.0000 [IU]/h | INTRAMUSCULAR | Status: DC
  Administered 2012-04-20: 1400 [IU]/h via INTRAVENOUS
  Filled 2012-04-19 (×2): qty 250

## 2012-04-19 MED ORDER — HEPARIN BOLUS VIA INFUSION: 4000.0000 [IU] | Freq: Once | INTRAVENOUS | Status: DC

## 2012-04-19 MED ORDER — DILTIAZEM HCL 50 MG/10ML IV SOLN
15.0000 mg | Freq: Once | INTRAVENOUS | Status: AC
  Administered 2012-04-19: 15 mg via INTRAVENOUS
  Filled 2012-04-19: qty 3

## 2012-04-19 MED ORDER — SODIUM CHLORIDE 0.9 % IJ SOLN
3.0000 mL | Freq: Two times a day (BID) | INTRAMUSCULAR | Status: DC
  Administered 2012-04-20 – 2012-04-21 (×3): 3 mL via INTRAVENOUS

## 2012-04-19 MED ORDER — DILTIAZEM HCL 100 MG IV SOLR
5.0000 mg/h | Freq: Once | INTRAVENOUS | Status: AC
  Administered 2012-04-19: 5 mg/h via INTRAVENOUS
  Filled 2012-04-19: qty 100

## 2012-04-19 MED ORDER — SODIUM CHLORIDE 0.9 % IV BOLUS (SEPSIS)
500.0000 mL | Freq: Once | INTRAVENOUS | Status: AC
  Administered 2012-04-19: 21:00:00 via INTRAVENOUS

## 2012-04-19 MED ORDER — NITROGLYCERIN 0.4 MG SL SUBL
0.4000 mg | SUBLINGUAL_TABLET | SUBLINGUAL | Status: DC | PRN
  Administered 2012-04-20 (×3): 0.4 mg via SUBLINGUAL
  Filled 2012-04-19 (×2): qty 25

## 2012-04-19 MED ORDER — SODIUM CHLORIDE 0.9 % IJ SOLN: 3.0000 mL | INTRAMUSCULAR | Status: DC | PRN

## 2012-04-19 MED ORDER — SODIUM CHLORIDE 0.9 % IV SOLN
Freq: Once | INTRAVENOUS | Status: AC
  Administered 2012-04-19: 21:00:00 via INTRAVENOUS

## 2012-04-19 MED ORDER — NICOTINE 21 MG/24HR TD PT24
21.0000 mg | MEDICATED_PATCH | Freq: Once | TRANSDERMAL | Status: AC
  Administered 2012-04-19: 21 mg via TRANSDERMAL
  Filled 2012-04-19: qty 1

## 2012-04-19 MED ORDER — ASPIRIN 81 MG PO CHEW: 324.0000 mg | CHEWABLE_TABLET | Freq: Once | ORAL | Status: DC

## 2012-04-19 MED ORDER — METOPROLOL SUCCINATE ER 50 MG PO TB24
50.0000 mg | ORAL_TABLET | Freq: Every day | ORAL | Status: DC
Start: 2012-04-20 — End: 2012-04-24
  Administered 2012-04-20 – 2012-04-24 (×5): 50 mg via ORAL
  Filled 2012-04-19 (×6): qty 1

## 2012-04-19 MED ORDER — SODIUM CHLORIDE 0.9 % IV SOLN
INTRAVENOUS | Status: DC
  Administered 2012-04-20: 75 mL/h via INTRAVENOUS

## 2012-04-19 MED ORDER — SODIUM CHLORIDE 0.9 % IV SOLN: 250.0000 mL | INTRAVENOUS | Status: DC | PRN

## 2012-04-19 MED ORDER — ONDANSETRON HCL 4 MG/2ML IJ SOLN: 4.0000 mg | Freq: Four times a day (QID) | INTRAMUSCULAR | Status: DC | PRN

## 2012-04-19 MED ORDER — ASPIRIN EC 81 MG PO TBEC
81.0000 mg | DELAYED_RELEASE_TABLET | Freq: Every day | ORAL | Status: DC
  Administered 2012-04-20: 81 mg via ORAL
  Filled 2012-04-19 (×2): qty 1

## 2012-04-19 MED ORDER — CLONAZEPAM 0.5 MG PO TABS
0.5000 mg | ORAL_TABLET | Freq: Three times a day (TID) | ORAL | Status: DC | PRN
  Administered 2012-04-20 – 2012-04-23 (×9): 0.5 mg via ORAL
  Filled 2012-04-19 (×10): qty 1

## 2012-04-19 MED ORDER — HEPARIN BOLUS VIA INFUSION
5000.0000 [IU] | Freq: Once | INTRAVENOUS | Status: AC
  Administered 2012-04-20: 5000 [IU] via INTRAVENOUS

## 2012-04-19 MED ORDER — ACETAMINOPHEN 325 MG PO TABS
650.0000 mg | ORAL_TABLET | ORAL | Status: DC | PRN
  Administered 2012-04-20 – 2012-04-23 (×4): 650 mg via ORAL
  Filled 2012-04-19 (×4): qty 2

## 2012-04-19 NOTE — ED Notes (Signed)
Pt from [facility] and started having CP.  Pt has cardiac hx and took 1 SL Nitro prior to EMS arrival.  EMS gave 324 mg ASA and 2 SL Nitro.  Pt now denies CP.

## 2012-04-19 NOTE — ED Notes (Signed)
Pt VERY irritable.  States smokes 3-4 packs per day and needs a cigarette.  Nicotine patch placed and klonopin ordered from pharmacy.  Await heparin before sending pt up.

## 2012-04-19 NOTE — Progress Notes (Signed)
ANTICOAGULATION CONSULT NOTE - Initial Consult  Pharmacy Consult for heparin Indication: chest pain/ACS, atrial fibrillation  No Known Allergies  Patient Measurements: Height: 5\' 11"  (180.3 cm) Weight: 200 lb (90.719 kg) IBW/kg (Calculated) : 75.3  Heparin Dosing Weight: 91 kg  Vital Signs: Temp: 99.3 F (37.4 C) (12/29 1903) Temp src: Oral (12/29 1903) BP: 132/84 mmHg (12/29 2015) Pulse Rate: 96  (12/29 2015)  Labs:  [facility] 04/19/12 1946  HGB 14.8  HCT 42.0  PLT 185  APTT --  LABPROT --  INR --  HEPARINUNFRC --  CREATININE 0.71  CKTOTAL --  CKMB --  TROPONINI --    Estimated Creatinine Clearance: 121.7 ml/min (by C-G formula based on Cr of 0.71).   Medical History: Past Medical History  Diagnosis Date  . DM type 2 (diabetes mellitus, type 2)   . CAD (coronary artery disease)   . HTN (hypertension)   . Hypercholesteremia   . Atrial fibrillation with RVR     Medications:  Scheduled:    . aspirin EC  81 mg Oral Daily  . metoprolol succinate  50 mg Oral Daily  . nicotine  21 mg Transdermal Once  . sodium chloride  3 mL Intravenous Q12H  . [DISCONTINUED] aspirin  324 mg Oral Once    Assessment: 56 yo male presented with chest pain and atrial fibrillation. Pharmacy to manage heparin. No anticoagulants prior to admission per med history.   Goal of Therapy:  Heparin level 0.3-0.7 units/ml Monitor platelets by anticoagulation protocol: Yes   Plan:  1. Heparin IV bolus of 5000 units x 1, then IV infusion to 1400 units/hr.  2. Heparin level in 6 hours.  3. Daily CBC, heparin level.  Emeline Gins 04/19/2012,11:25 PM

## 2012-04-19 NOTE — ED Notes (Signed)
Pt states CP actually started yesterday and he usually "beats his chest 3 or 4 times and it fixes it".  Pt states he tried it last night and today and it didn't work.

## 2012-04-19 NOTE — H&P (Signed)
History and Physical   Admit date: 04/19/2012 Name:  Kevin Orozco Medical record number: [id number] DOB/Age:  [birth year]  56 y.o. male  Referring Physician:   [facility]  Primary Cardiologist: Dr. Peter Swaziland  Primary Care Provider: Tomi Bamberger  Chief complaint/reason for admission:  Chest pain  HPI:  This 56 year old male has a history of chest pain and has had catheterization in 2008 that showed nonobstructive coronary disease with moderate disease in the LAD and mild disease in the circumflex and right coronary artery. He had a negative Cardiolite in 2010. He has a history of both tobacco and alcohol abuse and a remote history of some cocaine use. He had chest discomfort yesterday and was advised to come to the emergency room by a friend but did not. He had recurrence of chest discomfort today described as pressure and was brought to the emergency room and given nitroglycerin by EMS. He has been drinking and has had some alcohol does have alcohol on his breath today. He has a remote history of diabetes. He states he is on disability for his heart. He denies exertional angina normally. He has no PND, orthopnea or edema. He was found to be in atrial fibrillation on admission to the emergency room and was placed on intravenous diltiazem.    Past Medical History  Diagnosis Date  . DM type 2 (diabetes mellitus, type 2)   . CAD (coronary artery disease)   . HTN (hypertension)   . Hypercholesteremia   . Atrial fibrillation with RVR      Past Surgical History  Procedure Date  . Appendectomy   . Foot surgery    Allergies:  has no known allergies.   Medications: Prior to Admission medications   Medication Sig Start Date End Date Taking? Authorizing Provider  clonazePAM (KLONOPIN) 0.5 MG tablet Take 0.5 mg by mouth 3 (three) times daily as needed. For anxiety   Yes Historical Provider, MD  metoprolol succinate (TOPROL-XL) 50 MG 24 hr tablet Take 50 mg by mouth daily.  Take with or immediately following a meal.   Yes Historical Provider, MD  nitroGLYCERIN (NITROSTAT) 0.4 MG SL tablet Place 0.4 mg under the tongue every 5 (five) minutes as needed. For chest pain   Yes Historical Provider, MD   Family History:  Family Status  Relation Status Death Age  . Father Deceased 72    died of CAD/MI  . Mother Deceased 81    died of comps of Diabetes  . Sister Alive   . Brother Deceased 69    died of comps of diabetes  . Sister Deceased 34    died of comps of diabetes    Social History:   reports that he has been smoking.  He has never used smokeless tobacco. He reports that he drinks alcohol. He reports that he uses illicit drugs.   History   Social History Narrative   Unemployed.  Lives alone. Divorced.     Review of Systems:  He has had several injuries to his hand is previously from accidents. He has a moderate amount of arthritis. Has some dyspnea with exertion normally. No significant GI complaints. Other than as noted above, the remainder of the review of systems is normal  Physical Exam: BP 132/84  Pulse 96  Temp 99.3 F (37.4 C) (Oral)  Resp 18  SpO2 99% General appearance: Somewhat lethargic white male who is a poor historian, currently complaining of chest pain. Head: Normocephalic, without obvious abnormality, atraumatic  Neck: no adenopathy, no carotid bruit, no JVD and supple, symmetrical, trachea midline Lungs: clear to auscultation bilaterally Heart: Irregular heart rate, normal S1-S2 no S3 Abdomen: soft, non-tender; bowel sounds normal; no masses,  no organomegaly Rectal: deferred Extremities: extremities normal, atraumatic, no cyanosis or edema Pulses: 2+ and symmetric Skin: Skin color, texture, turgor normal. No rashes or lesions Neurologic: Grossly normal  Labs: CBC  [facility] 04/19/12 1946  WBC 7.4  RBC 4.50  HGB 14.8  HCT 42.0  PLT 185  MCV 93.3  MCH 32.9  MCHC 35.2  RDW 12.8  LYMPHSABS 3.7  MONOABS 0.6    EOSABS 0.1  BASOSABS 0.0   CMP   Basename 04/19/12 1946  NA 122*  K 4.6  CL 90*  CO2 18*  GLUCOSE 87  BUN 9  CREATININE 0.71  CALCIUM 8.3*  PROT 7.6  ALBUMIN 3.8  AST 251*  ALT 225*  ALKPHOS 79  BILITOT 0.5  GFRNONAA >90  GFRAA >90     EKG: Atrial fibrillation with rapid ventricular response  Radiology: No acute disease   IMPRESSIONS: 1. Chest discomfort consistent with unstable angina pectoris 2. Atrial fibrillation time of onset unknown 3. Acute alcohol abuse 4. Hyponatremia 5. Hypertension 6. Hyperlipidemia 7. Elevated transaminases  PLAN: Admit to step down, begin intravenous diltiazem come intravenous heparin, check serial enzymes, internal medicine consult for hyponatremia, normal saline. Keep n.p.o. after midnight for possible further testing. Urine drug screen because of history of cocaine use.  Signed: Darden Palmer MD [facility]  04/19/2012, 11:09 PM

## 2012-04-19 NOTE — ED Provider Notes (Signed)
History     CSN: [id number]  Arrival date & time 04/19/12  4098   First MD Initiated Contact with Patient 04/19/12 1859      Chief Complaint  Patient presents with  . Chest Pain    (Consider location/radiation/quality/duration/timing/severity/associated sxs/prior treatment) HPI Comments: Patient with known history of hypertension, dyslipidemia, nonobstructive coronary artery disease documented on cardiac catheterization January 19, 2007, which showed a 40-50% lesion in the ostial LAD, 30% lesion, mid LAD as well as 30% lesion in second marginal branch, also status post negative stress Myoview done 08/2008 by Dr. Peter [place], smoking history, and history of alcohol excess -- presents with several episodes of chest pain over the past 24 hours. Patient states he had chest pain at rest last evening, associated with jaw and left arm 'numbness' for which he took 2 nitroglycerin and hit himself in the chest -- which completely resolved his symptoms. This afternoon he had recurrence of left-sided chest pain and jaw/arm numbness. He took nitroglycerin again which resolved the symptoms however his friend called the ambulance and he was transported to the hospital for evaluation. Patient was found to be in new-onset A. fib with rapid ventricular response (heart rate 120-130). He is currently chest pain-free. Symptoms are not associated with worsening shortness of breath, palpitations, nausea. Patient denies other symptoms. He admits to smoking 3-4 packs of cigarettes a day and drinking alcohol daily. Onset acute. Course is resolved. Nothing makes symptoms worse.  The history is provided by the patient and medical records.    Past Medical History  Diagnosis Date  . DM type 2 (diabetes mellitus, type 2)   . CAD (coronary artery disease)   . HTN (hypertension)   . Hypercholesteremia     Past Surgical History  Procedure Date  . Appendectomy   . Foot surgery   . Coronary angioplasty with stent  placement     Family History  Problem Relation Age of Onset  . Heart attack Father 66  . Diabetes Mother 62    History  Substance Use Topics  . Smoking status: Current Every Day Smoker -- 1.0 packs/day  . Smokeless tobacco: Not on file  . Alcohol Use: Yes     Comment: socially      Review of Systems  Constitutional: Negative for fever and diaphoresis.  HENT: Negative for neck pain.   Eyes: Negative for redness.  Respiratory: Negative for cough and shortness of breath.   Cardiovascular: Positive for chest pain. Negative for palpitations and leg swelling.  Gastrointestinal: Negative for nausea, vomiting and abdominal pain.  Genitourinary: Negative for dysuria.  Musculoskeletal: Negative for back pain.  Skin: Negative for rash.  Neurological: Negative for syncope and light-headedness.    Allergies  Review of patient's allergies indicates no known allergies.  Home Medications   Current Outpatient Rx  Name  Route  Sig  Dispense  Refill  . CLONAZEPAM 0.5 MG PO TABS   Oral   Take 0.5 mg by mouth 3 (three) times daily as needed. For anxiety         . METOPROLOL SUCCINATE ER 50 MG PO TB24   Oral   Take 50 mg by mouth daily. Take with or immediately following a meal.         . NITROGLYCERIN 0.4 MG SL SUBL   Sublingual   Place 0.4 mg under the tongue every 5 (five) minutes as needed. For chest pain           BP 127/80  Pulse 123  Temp 99.3 F (37.4 C) (Oral)  Resp 18  SpO2 97%  Physical Exam  Nursing note and vitals reviewed. Constitutional: He appears well-developed and well-nourished.  HENT:  Head: Normocephalic and atraumatic.  Mouth/Throat: Mucous membranes are normal. Mucous membranes are not dry.  Eyes: Conjunctivae normal are normal.  Neck: Trachea normal and normal range of motion. Neck supple. Normal carotid pulses and no JVD present. No muscular tenderness present. Carotid bruit is not present. No tracheal deviation present.  Cardiovascular: S1  normal, S2 normal, normal heart sounds and intact distal pulses.  An irregularly irregular rhythm present. Tachycardia present.  Exam reveals no distant heart sounds and no decreased pulses.   No murmur heard. Pulses:      Radial pulses are 2+ on the right side, and 2+ on the left side.  Pulmonary/Chest: Effort normal and breath sounds normal. No respiratory distress. He has no wheezes. He exhibits no tenderness.  Abdominal: Soft. Normal aorta and bowel sounds are normal. There is no tenderness. There is no rebound and no guarding.  Musculoskeletal: He exhibits no edema.  Neurological: He is alert.  Skin: Skin is warm and dry. He is not diaphoretic. No cyanosis. No pallor.  Psychiatric: He has a normal mood and affect.    ED Course  Procedures (including critical care time)  Labs Reviewed  CBC WITH DIFFERENTIAL - Abnormal; Notable for the following:    Neutrophils Relative 40 (*)     Lymphocytes Relative 50 (*)     All other components within normal limits  COMPREHENSIVE METABOLIC PANEL - Abnormal; Notable for the following:    Sodium 122 (*)     Chloride 90 (*)     CO2 18 (*)     Calcium 8.3 (*)     AST 251 (*)     ALT 225 (*)     All other components within normal limits  POCT I-STAT TROPONIN I  POCT I-STAT TROPONIN I  PROTIME-INR  APTT  TSH  HEMOGLOBIN A1C  LIPID PANEL  TROPONIN I  TROPONIN I  TROPONIN I  BASIC METABOLIC PANEL  PRO B NATRIURETIC PEPTIDE  URINE RAPID DRUG SCREEN (HOSP PERFORMED)  ETHANOL   Dg Chest Portable 1 View  04/19/2012  *RADIOLOGY REPORT*  Clinical Data: Shortness of breath, chest pain and left arm numbness.  PORTABLE CHEST - 1 VIEW  Comparison: 07/07/2010.  Findings: Trachea is midline.  Heart is accentuated by AP technique.  Lungs are clear.  No pleural fluid.  IMPRESSION: No acute findings.   Original Report Authenticated By: Leanna Battles, M.D.      No diagnosis found.  7:28 PM Patient seen and examined. Work-up initiated. ASA given by  EMS. Medications ordered. D/w Dr. Lorenso Courier. Currently asymptomatic and BP 127/80. Will give trial of cardizem for afib. Will need cardiology consult.   Vital signs reviewed and are as follows: Filed Vitals:   04/19/12 1903  BP: 127/80  Pulse: 123  Temp: 99.3 F (37.4 C)  Resp: 18    Date: 04/19/2012  Rate: 116  Rhythm: atrial fibrillation  QRS Axis: normal  Intervals: normal  ST/T Wave abnormalities: nonspecific ST/T changes  Conduction Disutrbances:none  Narrative Interpretation:   Old EKG Reviewed: none available  9:01 PM After cardizem, HR 85-100. Afib continues. BP 110 systolic. Patient states he has some additional tightness in his chest.   9:51 PM Re-page cardiology. Patient stable. Cardizem drip stopped prior.   9:55 PM Spoke with Dr. Donnie Aho who will  see.   11:25 PM Patient admitted by Dr. Donnie Aho.    Date: 04/19/2012  Rate: 83  Rhythm: atrial fibrillation  QRS Axis: normal  Intervals: prolonged Qtc ( )  ST/T Wave abnormalities: normal  Conduction Disutrbances:none  Narrative Interpretation:   Old EKG Reviewed: unchanged from previous, rate controlled      MDM  Admit for ?unstable angina and new onset atrial fibrillation.         Renne Crigler, [place] 04/19/12 2326  Renne Crigler, [place] 04/19/12 2328

## 2012-04-20 ENCOUNTER — Encounter (HOSPITAL_COMMUNITY): Payer: Self-pay

## 2012-04-20 DIAGNOSIS — I4891 Unspecified atrial fibrillation: Principal | ICD-10-CM

## 2012-04-20 DIAGNOSIS — R7401 Elevation of levels of liver transaminase levels: Secondary | ICD-10-CM | POA: Diagnosis present

## 2012-04-20 LAB — HEPATIC FUNCTION PANEL
ALT: 244 U/L — ABNORMAL HIGH (ref 0–53)
AST: 287 U/L — ABNORMAL HIGH (ref 0–37)
Alkaline Phosphatase: 76 U/L (ref 39–117)
Bilirubin, Direct: 0.3 mg/dL (ref 0.0–0.3)
Total Bilirubin: 1.1 mg/dL (ref 0.3–1.2)

## 2012-04-20 LAB — TROPONIN I
Troponin I: <0.3 ng/mL (ref <0.30)
Troponin I: <0.3 ng/mL (ref <0.30)
Troponin I: <0.3 ng/mL (ref <0.30)

## 2012-04-20 LAB — CBC
HCT: 42.1 % (ref 39.0–52.0)
MCH: 32.4 pg (ref 26.0–34.0)
MCHC: 34 g/dL (ref 30.0–36.0)
RDW: 13 % (ref 11.5–15.5)

## 2012-04-20 LAB — ETHANOL: Alcohol, Ethyl (B): 171 mg/dL — ABNORMAL HIGH (ref 0–11)

## 2012-04-20 LAB — LIPID PANEL
HDL: 36 mg/dL — ABNORMAL LOW (ref >39)
LDL Cholesterol: 69 mg/dL (ref 0–99)
VLDL: 20 mg/dL (ref 0–40)

## 2012-04-20 LAB — PRO B NATRIURETIC PEPTIDE: Pro B Natriuretic peptide (BNP): 1586 pg/mL — ABNORMAL HIGH (ref 0–125)

## 2012-04-20 LAB — BASIC METABOLIC PANEL
CO2: 21 mEq/L (ref 19–32)
Calcium: 8.3 mg/dL — ABNORMAL LOW (ref 8.4–10.5)
Calcium: 8.9 mg/dL (ref 8.4–10.5)
Creatinine, Ser: 0.77 mg/dL (ref 0.50–1.35)
Creatinine, Ser: 0.66 mg/dL (ref 0.50–1.35)
GFR calc non Af Amer: >90 mL/min (ref >90)
Glucose, Bld: 75 mg/dL (ref 70–99)
Sodium: 135 mEq/L (ref 135–145)
Sodium: 136 mEq/L (ref 135–145)

## 2012-04-20 LAB — GLUCOSE, CAPILLARY: Glucose-Capillary: 118 mg/dL — ABNORMAL HIGH (ref 70–99)

## 2012-04-20 LAB — RAPID URINE DRUG SCREEN, HOSP PERFORMED: Barbiturates: NOT DETECTED

## 2012-04-20 LAB — HEMOGLOBIN A1C: Hgb A1c MFr Bld: 5.1 % (ref <5.7)

## 2012-04-20 LAB — TSH: TSH: 0.696 u[IU]/mL (ref 0.350–4.500)

## 2012-04-20 LAB — MRSA PCR SCREENING: MRSA by PCR: NEGATIVE

## 2012-04-20 MED ORDER — DILTIAZEM HCL 100 MG IV SOLR
5.0000 mg/h | INTRAVENOUS | Status: AC
  Administered 2012-04-20 – 2012-04-21 (×3): 5 mg/h via INTRAVENOUS

## 2012-04-20 MED ORDER — DABIGATRAN ETEXILATE MESYLATE 150 MG PO CAPS
150.0000 mg | ORAL_CAPSULE | Freq: Two times a day (BID) | ORAL | Status: DC
  Administered 2012-04-20 – 2012-04-24 (×8): 150 mg via ORAL
  Filled 2012-04-20 (×12): qty 1

## 2012-04-20 MED ORDER — FAMOTIDINE 20 MG PO TABS
20.0000 mg | ORAL_TABLET | Freq: Two times a day (BID) | ORAL | Status: DC
  Administered 2012-04-20 – 2012-04-24 (×9): 20 mg via ORAL
  Filled 2012-04-20 (×10): qty 1

## 2012-04-20 MED ORDER — BIOTENE DRY MOUTH MT LIQD
15.0000 mL | Freq: Two times a day (BID) | OROMUCOSAL | Status: DC
  Administered 2012-04-21 – 2012-04-22 (×4): 15 mL via OROMUCOSAL

## 2012-04-20 MED ORDER — NICOTINE 21 MG/24HR TD PT24
21.0000 mg | MEDICATED_PATCH | Freq: Every day | TRANSDERMAL | Status: DC
  Administered 2012-04-21 – 2012-04-24 (×4): 21 mg via TRANSDERMAL
  Filled 2012-04-20 (×5): qty 1

## 2012-04-20 MED ORDER — PNEUMOCOCCAL VAC POLYVALENT 25 MCG/0.5ML IJ INJ
0.5000 mL | INJECTION | INTRAMUSCULAR | Status: AC
  Administered 2012-04-21: 0.5 mL via INTRAMUSCULAR
  Filled 2012-04-20: qty 0.5

## 2012-04-20 MED ORDER — HEPARIN BOLUS VIA INFUSION
2000.0000 [IU] | Freq: Once | INTRAVENOUS | Status: AC
  Administered 2012-04-20: 2000 [IU] via INTRAVENOUS
  Filled 2012-04-20: qty 2000

## 2012-04-20 MED ORDER — CHLORHEXIDINE GLUCONATE 0.12 % MT SOLN
15.0000 mL | Freq: Two times a day (BID) | OROMUCOSAL | Status: DC
  Administered 2012-04-20 – 2012-04-22 (×5): 15 mL via OROMUCOSAL
  Filled 2012-04-20 (×6): qty 15

## 2012-04-20 MED ORDER — SODIUM CHLORIDE 0.9 % IV SOLN
INTRAVENOUS | Status: DC
  Administered 2012-04-20: 07:00:00 via INTRAVENOUS

## 2012-04-20 NOTE — Progress Notes (Signed)
   Subjective:  Intermittent chest pain (increased with cough and inspiration); DOE; palpitations   Objective:  Filed Vitals:   04/20/12 0800 04/20/12 0842 04/20/12 0902 04/20/12 0905  BP: 134/69   161/97  Pulse: 104  125 103  Temp:  98.1 F (36.7 C)    TempSrc:  Oral    Resp: 20   11  Height:      Weight:      SpO2: 96%   98%    Intake/Output from previous day:  Intake/Output Summary (Last 24 hours) at 04/20/12 1109 Last data filed at 04/20/12 1000  Gross per 24 hour  Intake  95.85 ml  Output   4425 ml  Net -4329.15 ml    Physical Exam: Physical exam: Well-developed well-nourished in no acute distress.  Skin is warm and dry.  HEENT is normal.  Neck is supple.  Chest is clear to auscultation with normal expansion.  Cardiovascular exam is irregular and tachycardic Abdominal exam nontender or distended. No masses palpated. Extremities show no edema. neuro grossly intact    Lab Results: Basic Metabolic Panel:  Basename 04/20/12 0853 04/20/12 0500  NA 136 135  K 4.9 4.7  CL 101 102  CO2 21 22  GLUCOSE 81 75  BUN 8 8  CREATININE 0.66 0.77  CALCIUM 8.9 8.3*  MG -- --  PHOS -- --   CBC:  Basename 04/20/12 0500 04/19/12 1946  WBC 5.9 7.4  NEUTROABS -- 3.0  HGB 14.3 14.8  HCT 42.1 42.0  MCV 95.5 93.3  PLT 157 185   Cardiac Enzymes:  Basename 04/20/12 0500 04/19/12 2323  CKTOTAL -- --  CKMB -- --  CKMBINDEX -- --  TROPONINI <0.30 <0.30     Assessment/Plan:  1 Atrial fibrillation - Patient complains of symptoms for approximately 4 days; plan continue cardizem and lopressor; titrate for rate control; DC heparin; begin pradaxa; proceed with TEE DCCV on Thursday after 5 full doses of pradaxa if atrial fibrillation persists. I do not think he would be a good long term anticoagulation candidate given history of ETOH abuse (may have contributed to atrial fibrillation). Check echo and TSH. 2 Chest pain - symptoms atypical and enzymes neg; myoview in AM 3  Elevated LFTs - being evaluated by primary care; possibly related to ETOH. 4 HTN - continue present meds 5 Tobacco abuse 6 ETOH   Olga Millers 04/20/2012, 11:09 AM

## 2012-04-20 NOTE — Consult Note (Addendum)
[facility] Medical Consultation  Kevin Orozco ZOX:[id number] DOB: Jul 08, 1955 DOA: 04/19/2012 PCP: Tomi Bamberger, NP   Requesting physician: Dr. Donnie Aho Date of consultation: 04/20/12 Reason for consultation: Hyponatremia, Transaminitis,   Impression/Recommendations Principal Problem:  *Atrial fibrillation Active Problems:  CAD (coronary artery disease)  Unstable angina  Hyponatremia    1. Elevated LFTs - rechecking viral hepatitis panel on patient, I see where hepatitis A and B have been checked in 2010 and were negative in EPIC but do not see a result for HCV.  His LFTs appear to be chronically elevated around 200 or so, all previous values have been elevated in this range that I can find in epic dating back to 2008.  Given the history of cocaine will order hepatitis panel for initial evaluation.  Chronic EtOH use can also cause this as well and in the event of a negative HCV is probably the most likely cause of his transaminitis.  Other conditions that can cause this hepatocellular pattern of injury include: Autoimmune hepatitis (ordering antinuclear, anti-smooth muscle, and antimitochondrial antibodies), Toxin exposure (absent from history) and Wilson Disease (hepatitis usually more acute, associated with neurologic symptoms, and occular findings that are absent in this patient). 2. Hyponatremia - most likely due to mild water intoxication from the patients binge drinking during the day prior to labs being drawn in ED (admits to 12 beers).  Patient is noted to have significant urine output throughout the course of the night (at least 1.5 L since 1:30am, despite no diuretics) rechecking BMP with AM labs, as well as urine sodium and potassium, I suspect given the UOP, that this may be resolving on its own and with NS treatment. 3. EtOH abuse - per patient no prior history of withdrawals, seizures, or DTs.  Will monitor patient and put on CIWA if he demonstrates symptoms of withdrawals  during his stay. 4. A. Fib: rate control with cardizem, on heparin, serial enzymes being checked and negative thus far. 5. HTN - currently controlled with BPs 110/78 on the cardizem gtt, Dr. Donnie Aho has ordered the patients home meds to be continued. 6. Questionable h/o DM2 - seen during review of patients PMH in chart records, despite this I dont see where his BGLs have ever been found to be significantly elevated including thus far during this admission, A1C is already pending on the patient. 7. CAD with Unstable angina - Cardiology is primary on patient, possible further testing today.  I will followup again tomorrow. Please contact me if I can be of assistance in the meanwhile. Thank you for this consultation.  Chief Complaint: Chest pain  HPI:  56 yo M presented to the ED with c/o chest pain, had cath in 2008 that showed nonobstructive CAD with moderate disease in LAD, mild disease in circumflex and RCA.  Neg Cardiolite in 2010.  Heavy tobacco use, ongoing EtOH abuse which he describes primarily as binge drinking beer on the weekends, last drink was during the day prior to admission last night.  Drank about a 12 pack of beer during the day he says.  The patient began to develop chest pain on Sat but didn't go to the ER even though a friend advised him to do so.  He finally went on Sun when his chest pain recurred.  In the ED the patient was found to be in A.Fib with RVR (new onset), and he was admitted to cardiology service on IV cardizem gtt.  He was also hyponatremic, have a positive blood EtOH, and had  elevated LFTs, as a result hospitalist service has been asked to consult.  Review of Systems:  12 systems reviewed and otherwise negative  Past Medical History  Diagnosis Date  . DM type 2 (diabetes mellitus, type 2)   . CAD (coronary artery disease)   . HTN (hypertension)   . Hypercholesteremia   . Atrial fibrillation with RVR    Past Surgical History  Procedure Date  .  Appendectomy   . Foot surgery    Social History:  reports that he has been smoking.  He has never used smokeless tobacco. He reports that he drinks about 6 ounces of alcohol per week. He reports that he uses illicit drugs.  No Known Allergies Family History  Problem Relation Age of Onset  . Heart attack Father 13  . Diabetes Mother 50    Prior to Admission medications   Medication Sig Start Date End Date Taking? Authorizing Provider  clonazePAM (KLONOPIN) 0.5 MG tablet Take 0.5 mg by mouth 3 (three) times daily as needed. For anxiety   Yes Historical Provider, MD  metoprolol succinate (TOPROL-XL) 50 MG 24 hr tablet Take 50 mg by mouth daily. Take with or immediately following a meal.   Yes Historical Provider, MD  nitroGLYCERIN (NITROSTAT) 0.4 MG SL tablet Place 0.4 mg under the tongue every 5 (five) minutes as needed. For chest pain   Yes Historical Provider, MD   Physical Exam: Blood pressure 131/67, pulse 121, temperature 97.7 F (36.5 C), temperature source Oral, resp. rate 20, height 5\' 11"  (1.803 m), weight 98 kg (216 lb 0.8 oz), SpO2 94.00%. Filed Vitals:   04/19/12 2306 04/19/12 2320 04/20/12 0026 04/20/12 0124  BP:   93/63 131/67  Pulse:   97 121  Temp:    97.7 F (36.5 C)  TempSrc:    Oral  Resp:   20   Height:  5\' 11"  (1.803 m)  5\' 11"  (1.803 m)  Weight:  90.719 kg (200 lb)  98 kg (216 lb 0.8 oz)  SpO2: 99%  94%     General:  NAD, resting comfortably in bed Eyes: PEERLA EOMI ENT: mucous membranes moist Neck: supple w/o JVD Cardiovascular: irr, irr, tachycardic, w/o MRG Respiratory: CTA B Abdomen: soft, nt, nd, bs+ Skin: no rash nor lesion Musculoskeletal: MAE, full ROM all 4 extremities Psychiatric: normal tone and affect Neurologic: AAOx3, grossly non-focal  Labs on Admission:  Basic Metabolic Panel:  Lab 04/19/12 8295  NA 122*  K 4.6  CL 90*  CO2 18*  GLUCOSE 87  BUN 9  CREATININE 0.71  CALCIUM 8.3*  MG --  PHOS --   Liver Function  Tests:  Lab 04/19/12 1946  AST 251*  ALT 225*  ALKPHOS 79  BILITOT 0.5  PROT 7.6  ALBUMIN 3.8   No results found for this basename: LIPASE:5,AMYLASE:5 in the last 168 hours No results found for this basename: AMMONIA:5 in the last 168 hours CBC:  Lab 04/19/12 1946  WBC 7.4  NEUTROABS 3.0  HGB 14.8  HCT 42.0  MCV 93.3  PLT 185   Cardiac Enzymes:  Lab 04/19/12 2323  CKTOTAL --  CKMB --  CKMBINDEX --  TROPONINI <0.30   BNP: No components found with this basename: POCBNP:5 CBG: No results found for this basename: GLUCAP:5 in the last 168 hours  Radiological Exams on Admission: Dg Chest Portable 1 View  04/19/2012  *RADIOLOGY REPORT*  Clinical Data: Shortness of breath, chest pain and left arm numbness.  PORTABLE CHEST -  1 VIEW  Comparison: 07/07/2010.  Findings: Trachea is midline.  Heart is accentuated by AP technique.  Lungs are clear.  No pleural fluid.  IMPRESSION: No acute findings.   Original Report Authenticated By: Leanna Battles, M.D.     EKG: Independently reviewed. A.Fib with RVR  Time spent: 80 min  GARDNER, JARED M. [facility] Pager [phone]  If 7PM-7AM, please contact night-coverage [email or web address] Password [facility] 04/20/2012, 5:38 AM

## 2012-04-20 NOTE — ED Provider Notes (Signed)
Medical screening examination/treatment/procedure(s) were performed by non-physician practitioner and as supervising physician I was immediately available for consultation/collaboration.  Tobin Chad, MD 04/20/12 [id number]

## 2012-04-20 NOTE — ED Notes (Signed)
3rd nitro not given d/t hypotension

## 2012-04-20 NOTE — Progress Notes (Signed)
Pt. States he's feeling pressure in his chest.  0.4mg  sublingual nitro given at this time.  Will continue to monitor.

## 2012-04-20 NOTE — Progress Notes (Signed)
   Seen earlier today by my colleague Dr. Julian Reil in consultation.  Patient seen and examined, and data base reviewed.  Internal medicine was asked to see patient in consultation for transaminitis and hyponatremia.  Hyponatremia resolved likely secondary to dehydration.  Transaminitis seems to chronic with slight worssening. Check RUQ ultrasound, hepatitis panel and UDS.  Clint Lipps Pager: [phone] 04/20/2012, 12:04 PM

## 2012-04-20 NOTE — Progress Notes (Signed)
UR completed 

## 2012-04-20 NOTE — Progress Notes (Signed)
ANTICOAGULATION CONSULT NOTE - Initial Consult  Pharmacy Consult for heparin Indication: chest pain/ACS, atrial fibrillation  No Known Allergies  Patient Measurements: Height: 5\' 11"  (180.3 cm) Weight: 216 lb 0.8 oz (98 kg) IBW/kg (Calculated) : 75.3  Heparin Dosing Weight: 91 kg  Vital Signs: Temp: 97.7 F (36.5 C) (12/30 0124) Temp src: Oral (12/30 0124) BP: 131/67 mmHg (12/30 0124) Pulse Rate: 121  (12/30 0124)  Labs:  Basename 04/20/12 0500 04/19/12 2323 04/19/12 1946  HGB 14.3 -- 14.8  HCT 42.1 -- 42.0  PLT 157 -- 185  APTT -- 37 --  LABPROT -- 13.8 --  INR -- 1.07 --  HEPARINUNFRC 0.11* -- --  CREATININE 0.77 -- 0.71  CKTOTAL -- -- --  CKMB -- -- --  TROPONINI <0.30 <0.30 --    Estimated Creatinine Clearance: 126 ml/min (by C-G formula based on Cr of 0.77).  Assessment: 56 yo male with chest pain and atrial fibrillation for Heparin  Goal of Therapy:  Heparin level 0.3-0.7 units/ml Monitor platelets by anticoagulation protocol: Yes   Plan:  Heparin 2000 units IV bolus, then increase heparin 1750 units/hr Check heparin level in 6 hours.  Ben Habermann, Gary Fleet 04/20/2012,6:31 AM

## 2012-04-21 ENCOUNTER — Inpatient Hospital Stay (HOSPITAL_COMMUNITY): Payer: Medicaid Other

## 2012-04-21 DIAGNOSIS — I517 Cardiomegaly: Secondary | ICD-10-CM

## 2012-04-21 DIAGNOSIS — I10 Essential (primary) hypertension: Secondary | ICD-10-CM

## 2012-04-21 DIAGNOSIS — R079 Chest pain, unspecified: Secondary | ICD-10-CM

## 2012-04-21 DIAGNOSIS — R7401 Elevation of levels of liver transaminase levels: Secondary | ICD-10-CM

## 2012-04-21 DIAGNOSIS — E871 Hypo-osmolality and hyponatremia: Secondary | ICD-10-CM

## 2012-04-21 LAB — COMPREHENSIVE METABOLIC PANEL
Alkaline Phosphatase: 80 U/L (ref 39–117)
BUN: 16 mg/dL (ref 6–23)
CO2: 27 mEq/L (ref 19–32)
Chloride: 101 mEq/L (ref 96–112)
Creatinine, Ser: 1.08 mg/dL (ref 0.50–1.35)
GFR calc Af Amer: 88 mL/min — ABNORMAL LOW (ref >90)
GFR calc non Af Amer: 76 mL/min — ABNORMAL LOW (ref >90)
Glucose, Bld: 109 mg/dL — ABNORMAL HIGH (ref 70–99)
Potassium: 4.6 mEq/L (ref 3.5–5.1)
Total Bilirubin: 0.7 mg/dL (ref 0.3–1.2)

## 2012-04-21 LAB — TSH: TSH: 1.866 u[IU]/mL (ref 0.350–4.500)

## 2012-04-21 LAB — CBC
HCT: 41 % (ref 39.0–52.0)
Hemoglobin: 13.4 g/dL (ref 13.0–17.0)
MCV: 96.2 fL (ref 78.0–100.0)
RBC: 4.26 MIL/uL (ref 4.22–5.81)
RDW: 13.3 % (ref 11.5–15.5)
WBC: 4.9 10*3/uL (ref 4.0–10.5)

## 2012-04-21 LAB — FERRITIN: Ferritin: 1244 ng/mL — ABNORMAL HIGH (ref 22–322)

## 2012-04-21 LAB — ANA: Anti Nuclear Antibody(ANA): NEGATIVE

## 2012-04-21 LAB — HEPATITIS PANEL, ACUTE
Hep B C IgM: NEGATIVE
Hepatitis B Surface Ag: NEGATIVE

## 2012-04-21 MED ORDER — SODIUM CHLORIDE 0.9 % IV SOLN
1.0000 mL/kg/h | INTRAVENOUS | Status: DC
  Administered 2012-04-23: 1 mL/kg/h via INTRAVENOUS

## 2012-04-21 MED ORDER — ASPIRIN 81 MG PO CHEW: 324.0000 mg | CHEWABLE_TABLET | ORAL | Status: DC

## 2012-04-21 MED ORDER — TECHNETIUM TC 99M SESTAMIBI GENERIC - CARDIOLITE
30.0000 | Freq: Once | INTRAVENOUS | Status: AC | PRN
  Administered 2012-04-21: 30 via INTRAVENOUS

## 2012-04-21 MED ORDER — SODIUM CHLORIDE 0.9 % IV SOLN: 250.0000 mL | INTRAVENOUS | Status: DC | PRN

## 2012-04-21 MED ORDER — SODIUM CHLORIDE 0.9 % IJ SOLN: 3.0000 mL | INTRAMUSCULAR | Status: DC | PRN

## 2012-04-21 MED ORDER — SODIUM CHLORIDE 0.9 % IJ SOLN: 3.0000 mL | Freq: Two times a day (BID) | INTRAMUSCULAR | Status: DC

## 2012-04-21 MED ORDER — TECHNETIUM TC 99M SESTAMIBI GENERIC - CARDIOLITE
10.0000 | Freq: Once | INTRAVENOUS | Status: AC | PRN
  Administered 2012-04-21: 10 via INTRAVENOUS

## 2012-04-21 MED ORDER — ASPIRIN 81 MG PO CHEW
324.0000 mg | CHEWABLE_TABLET | Freq: Once | ORAL | Status: AC
  Administered 2012-04-23: 324 mg via ORAL
  Filled 2012-04-21: qty 4

## 2012-04-21 MED ORDER — REGADENOSON 0.4 MG/5ML IV SOLN
INTRAVENOUS | Status: AC
  Administered 2012-04-21: 0.4 mg
  Filled 2012-04-21: qty 5

## 2012-04-21 NOTE — Progress Notes (Signed)
   Subjective:  Intermittent chest pain (increased with cough and inspiration); DOE; palpitations   Objective:  Filed Vitals:   04/21/12 0400 04/21/12 0500 04/21/12 0600 04/21/12 0700  BP: 94/50 95/70 105/68 97/56  Pulse: 65 70 82 63  Temp: 97.7 F (36.5 C)     TempSrc:      Resp: 20 24 18 17   Height:      Weight: 209 lb 1.6 oz (94.847 kg)     SpO2: 96% 93% 95% 97%    Intake/Output from previous day:  Intake/Output Summary (Last 24 hours) at 04/21/12 0745 Last data filed at 04/21/12 0700  Gross per 24 hour  Intake 282.33 ml  Output   3000 ml  Net -2717.67 ml    Physical Exam: Physical exam: Well-developed well-nourished in no acute distress.  Skin is warm and dry.  HEENT is normal.  Neck is supple.  Chest is clear to auscultation with normal expansion.  Cardiovascular exam is irregular  Abdominal exam nontender or distended. No masses palpated. Extremities show no edema. neuro grossly intact    Lab Results: Basic Metabolic Panel:  Basename 04/21/12 0448 04/20/12 0853  NA 136 136  K 4.6 4.9  CL 101 101  CO2 27 21  GLUCOSE 109* 81  BUN 16 8  CREATININE 1.08 0.66  CALCIUM 9.1 8.9  MG -- --  PHOS -- --   CBC:  Basename 04/21/12 0448 04/20/12 0500 04/19/12 1946  WBC 4.9 5.9 --  NEUTROABS -- -- 3.0  HGB 13.4 14.3 --  HCT 41.0 42.1 --  MCV 96.2 95.5 --  PLT 141* 157 --   Cardiac Enzymes:  Basename 04/20/12 1241 04/20/12 0500 04/19/12 2323  CKTOTAL -- -- --  CKMB -- -- --  CKMBINDEX -- -- --  TROPONINI <0.30 <0.30 <0.30     Assessment/Plan:  1 Atrial fibrillation - Patient complains of symptoms for approximately 4 days; plan continue cardizem and lopressor; titrate for rate control; continue Pradaxa and  proceed with TEE DCCV on Thursday after 5 full doses of pradaxa if atrial fibrillation persists. I do not think he would be a good long term anticoagulation candidate given history of ETOH abuse (may have contributed to atrial fibrillation).  Check echo and TSH. 2 Chest pain - symptoms atypical and enzymes neg; for Myoivew today. 3 Elevated LFTs - being evaluated by primary care; possibly related to ETOH. 4 HTN - continue present meds 5 Tobacco abuse 6 ETOH abuse   Elyn Aquas. 04/21/2012, 7:45 AM

## 2012-04-21 NOTE — Progress Notes (Signed)
[facility] PROGRESS NOTE  Kevin Orozco ZOX:[id number] DOB: [birth year] DOA: 04/19/2012 PCP: Tomi Bamberger, NP  [facility] will sign off, please call if needed.  HPI/Subjective: Had chest pain last night responded to nitroglycerin, denies any chest pain this morning. Still on heparin drip.   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 -Abdominal ultrasound showed no abnormalities. -Can be referred to [facility] as outpatient. -Chronic elevation of AST/ALT.  Hyponatremia -Likely water intoxication from beer potomania -This is resolved.  Chest pain -Patient for my review today, hasn't negative cardiac enzymes.  Atrial fibrillation -Per primary team, patient will have TEE and direct current cardioversion.  Alcohol abuse -Patient said he drinks 8-12 beers on weekend and infrequently. -Never had withdrawal symptoms before, if he developed withdrawal symptoms CIWA protocol.  Code Status: Full code Family Communication:  Disposition Plan: Remain as inpatient, stepdown  Procedures:  None  Antibiotics:  None.  Objective: Filed Vitals:   04/21/12 1047 04/21/12 1049 04/21/12 1304 04/21/12 1400  BP: 123/66 126/71 110/85 105/70  Pulse: 103 98 119   Temp:      TempSrc:      Resp:    20  Height:      Weight:      SpO2:        Intake/Output Summary (Last 24 hours) at 04/21/12 1425 Last data filed at 04/21/12 0752  Gross per 24 hour  Intake    175 ml  Output   1225 ml  Net  -1050 ml   Filed Weights   04/19/12 2320 04/20/12 0124 04/21/12 0400  Weight: 90.719 kg (200 lb) 98 kg (216 lb 0.8 oz) 94.847 kg (209 lb 1.6 oz)    Exam:  General: Alert and awake, oriented x3, not in any acute distress. HEENT: anicteric sclera, pupils reactive to light and accommodation, EOMI CVS: S1-S2 clear, no murmur rubs or gallops Chest: clear to auscultation bilaterally, no  wheezing, rales or rhonchi Abdomen: soft nontender, nondistended, normal bowel sounds, no organomegaly Extremities: no cyanosis, clubbing or edema noted bilaterally  Neuro: Cranial nerves II-XII intact, no focal neurological deficits*  Data Reviewed: Basic Metabolic Panel:  Lab 04/21/12 8119 04/20/12 0853 04/20/12 0500 04/19/12 1946  NA 136 136 135 122*  K 4.6 4.9 4.7 4.6  CL 101 101 102 90*  CO2 27 21 22  18*  GLUCOSE 109* 81 75 87  BUN 16 8 8 9   CREATININE 1.08 0.66 0.77 0.71  CALCIUM 9.1 8.9 8.3* 8.3*  MG -- -- -- --  PHOS -- -- -- --   Liver Function Tests:  Lab 04/21/12 0448 04/20/12 0853 04/19/12 1946  AST 175* 287* 251*  ALT 186* 244* 225*  ALKPHOS 80 76 79  BILITOT 0.7 1.1 0.5  PROT 7.4 7.7 7.6  ALBUMIN 3.4* 3.8 3.8   No results found for this basename: LIPASE:5,AMYLASE:5 in the last 168 hours No results found for this basename: AMMONIA:5 in the last 168 hours CBC:  Lab 04/21/12 0448 04/20/12 0500 04/19/12 1946  WBC 4.9 5.9 7.4  NEUTROABS -- -- 3.0  HGB 13.4 14.3 14.8  HCT 41.0 42.1 42.0  MCV 96.2 95.5 93.3  PLT 141* 157 185   Cardiac Enzymes:  Lab 04/20/12 1241 04/20/12 0500 04/19/12 2323  CKTOTAL -- -- --  CKMB -- -- --  CKMBINDEX -- -- --  TROPONINI <0.30 <0.30 <0.30   BNP (last 3 results)  [facility] 04/19/12 2323  PROBNP 1586.0*  CBG:  Lab 04/20/12 1549 04/20/12 1152 04/20/12 0841  GLUCAP 118* 88 85    Recent Results (from the past 240 hour(s))  MRSA PCR SCREENING     Status: Normal   Collection Time   04/20/12  1:04 AM      Component Value Range Status Comment   MRSA by PCR NEGATIVE  NEGATIVE Final      Studies: US Abdomen Complete  04/21/2012  *RADIOLOGY REPORT*  Clinical Data:  Transaminitis.  COMPLETE ABDOMINAL ULTRASOUND  Comparison:  None.  Findings:  Gallbladder:  No gallstones, gallbladder wall thickening, or pericholecystic fluid.  Common bile duct:   Normal caliber, 4 mm.  Liver:  No focal lesion identified.  Within normal  limits in parenchymal echogenicity.  IVC:  Appears normal.  Pancreas:  No focal abnormality seen.  Spleen:  Within normal limits in size and echotexture.  Right Kidney:   10 mm benign-appearing cyst in the upper pole of the right kidney.  No hydronephrosis.  Normal echotexture.  Left Kidney:  Multiple small benign cysts, the largest measuring up to 2.5 cm in the upper pole.  No hydronephrosis.  Normal echotexture.  Abdominal aorta:  No aneurysm identified.  IMPRESSION: Small bilateral renal cysts.  No acute findings.   Original Report Authenticated By: Charlett Nose, M.D.    Dg Chest Portable 1 View  04/19/2012  *RADIOLOGY REPORT*  Clinical Data: Shortness of breath, chest pain and left arm numbness.  PORTABLE CHEST - 1 VIEW  Comparison: 07/07/2010.  Findings: Trachea is midline.  Heart is accentuated by AP technique.  Lungs are clear.  No pleural fluid.  IMPRESSION: No acute findings.   Original Report Authenticated By: Leanna Battles, M.D.     Scheduled Meds:   . antiseptic oral rinse  15 mL Mouth Rinse q12n4p  . chlorhexidine  15 mL Mouth Rinse BID  . dabigatran  150 mg Oral Q12H  . famotidine  20 mg Oral BID  . metoprolol succinate  50 mg Oral Daily  . nicotine  21 mg Transdermal Daily  . sodium chloride  3 mL Intravenous Q12H   Continuous Infusions:   . sodium chloride 10 mL/hr at 04/20/12 2000  . diltiazem (CARDIZEM) infusion 5 mg/hr (04/20/12 2049)    Principal Problem:  *Atrial fibrillation Active Problems:  CAD (coronary artery disease)  Alcohol abuse  Unstable angina  Hyponatremia  Transaminitis    Time spent: 35 minutes    [facility] A  [facility] Pager [phone]. If 8PM-8AM, please contact night-coverage at [email or web address], password [facility] 04/21/2012, 2:25 PM  LOS: 2 days

## 2012-04-21 NOTE — Progress Notes (Signed)
Echocardiogram 2D Echocardiogram has been performed.  Kevin Orozco A 04/21/2012, 3:17 PM

## 2012-04-21 NOTE — Progress Notes (Signed)
The myoview was intrepreted as an intermediate risk study.  There is a small ,mild mid septal defect that appears to be reversible by the computer calculations ( SDS =7).  Visually, the rest and stress scans were fairly similar.   He has continued to have some chest pain. He has a long hx of cocaine use and he may be having coronary spasm as an explanation of this unusual "isolated septal" defect.     Will schedule him for cath on Thursday.   Vesta Mixer, Montez Hageman., MD, [facility] 04/21/2012, 2:59 PM Office - [phone] Pager [phone]

## 2012-04-22 LAB — MITOCHONDRIAL ANTIBODIES: Mitochondrial M2 Ab, IgG: 0.79 (ref <0.91)

## 2012-04-22 MED ORDER — SODIUM CHLORIDE 0.9 % IV SOLN: 250.0000 mL | INTRAVENOUS | Status: DC

## 2012-04-22 MED ORDER — DILTIAZEM HCL ER COATED BEADS 240 MG PO CP24
240.0000 mg | ORAL_CAPSULE | Freq: Every day | ORAL | Status: DC
  Administered 2012-04-22 – 2012-04-24 (×3): 240 mg via ORAL
  Filled 2012-04-22 (×3): qty 1

## 2012-04-22 MED ORDER — SODIUM CHLORIDE 0.9 % IJ SOLN
3.0000 mL | Freq: Two times a day (BID) | INTRAMUSCULAR | Status: DC
  Administered 2012-04-23: 3 mL via INTRAVENOUS

## 2012-04-22 MED ORDER — SODIUM CHLORIDE 0.9 % IJ SOLN: 3.0000 mL | INTRAMUSCULAR | Status: DC | PRN

## 2012-04-22 MED ORDER — SODIUM CHLORIDE 0.9 % IJ SOLN
3.0000 mL | Freq: Two times a day (BID) | INTRAMUSCULAR | Status: DC
  Administered 2012-04-22: 3 mL via INTRAVENOUS

## 2012-04-22 MED ORDER — ZOLPIDEM TARTRATE 5 MG PO TABS
5.0000 mg | ORAL_TABLET | Freq: Every evening | ORAL | Status: DC | PRN
  Administered 2012-04-22 – 2012-04-23 (×2): 5 mg via ORAL
  Filled 2012-04-22 (×2): qty 1

## 2012-04-22 NOTE — Progress Notes (Signed)
Patient ID: Kevin Orozco, male   DOB: 05-02-1955, 57 y.o.   MRN: [id number] [facility] PROGRESS NOTE  Kevin Orozco WUJ:[id number] DOB: Oct 15, 1955 DOA: 04/19/2012 PCP: Tomi Bamberger, NP  Brief narrative: Pt is 57 yo patient of Dr. Elvis Coil who presented with CP and atrial fibrillation. He has a long hx of cocaine abuse - his UDS was + for cocaine this admission and an admission last year. Echo showed normal LV function - EF of 65%. Myoview showed an unusual pattern of mid septal attenuation. This could be explained by coronary spasm ( possibly due to cocaine). I have tentatively scheduled him for cath tomorrow.   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  -Abdominal ultrasound showed no abnormalities.  -Can be referred to [facility] infection [facility] as outpatient.  -Chronic elevation of AST/ALT but now trending down, will obtain CMET in AM Hyponatremia  -Likely water intoxication from beer potomania  -This is resolved.  Chest pain  -Patient denies chest pain this AM - Myoview results noted below - cardiology following  Atrial fibrillation  -Per primary team, patient will have TEE and direct current cardioversion, plan for both tomorrow  Alcohol abuse  -Patient said he drinks 8-12 beers on weekend and infrequently.  -Never had withdrawal symptoms before, if he developed withdrawal symptoms CIWA protocol.   Consultants:  [facility] - consulting   Procedures/Studies: US Abdomen Complete 04/21/2012  Small bilateral renal cysts.  No acute findings.    Nm Myocar Multi W/spect W/wall Motion / Ef 04/21/2012    This is interpreted as an intermediate risk Myoview study.  He has some mild ischemia in the mid septal wall which improved slightly.  The SDS is 7. He has known moderate coronary artery disease by cardiac catheterization several years ago.  He has continued to use cocaine.  We were discussed these  results with the patient.   Antibiotics:  None  Code Status: Full Family Communication: Pt at bedside Disposition Plan: Home when medically stable  HPI/Subjective: No events overnight.   Objective: Filed Vitals:   04/22/12 0556 04/22/12 0745 04/22/12 0748 04/22/12 1145  BP:  110/64 110/64 107/70  Pulse: 85 84 71 88  Temp:  98.7 F (37.1 C)  98.5 F (36.9 C)  TempSrc:  Oral  Oral  Resp: 23 16 22 20   Height:      Weight: 92.6 kg (204 lb 2.3 oz)     SpO2: 97% 98% 95% 96%    Intake/Output Summary (Last 24 hours) at 04/22/12 1443 Last data filed at 04/22/12 1300  Gross per 24 hour  Intake 1028.25 ml  Output   1075 ml  Net -46.75 ml    Exam:   General:  Pt is alert, follows commands appropriately, not in acute distress  Cardiovascular: Irregular rate and rhythm, S1/S2, no murmurs, no rubs, no gallops  Respiratory: Clear to auscultation bilaterally, no wheezing, no crackles, no rhonchi  Abdomen: Soft, non tender, non distended, bowel sounds present, no guarding  Extremities: No edema, pulses DP and PT palpable bilaterally  Neuro: Grossly nonfocal  Data Reviewed: Basic Metabolic Panel:  Lab 04/21/12 9562 04/20/12 0853 04/20/12 0500 04/19/12 1946  NA 136 136 135 122*  K 4.6 4.9 4.7 4.6  CL 101 101 102 90*  CO2 27 21 22  18*  GLUCOSE 109* 81 75 87  BUN 16 8 8 9   CREATININE 1.08 0.66 0.77 0.71  CALCIUM 9.1 8.9 8.3* 8.3*  MG -- -- -- --  PHOS -- -- -- --   Liver Function Tests:  Lab 04/21/12 0448 04/20/12 0853 04/19/12 1946  AST 175* 287* 251*  ALT 186* 244* 225*  ALKPHOS 80 76 79  BILITOT 0.7 1.1 0.5  PROT 7.4 7.7 7.6  ALBUMIN 3.4* 3.8 3.8   No results found for this basename: LIPASE:5,AMYLASE:5 in the last 168 hours No results found for this basename: AMMONIA:5 in the last 168 hours CBC:  Lab 04/21/12 0448 04/20/12 0500 04/19/12 1946  WBC 4.9 5.9 7.4  NEUTROABS -- -- 3.0  HGB 13.4 14.3 14.8  HCT 41.0 42.1 42.0  MCV 96.2 95.5 93.3  PLT 141*  157 185   Cardiac Enzymes:  Lab 04/20/12 1241 04/20/12 0500 04/19/12 2323  CKTOTAL -- -- --  CKMB -- -- --  CKMBINDEX -- -- --  TROPONINI <0.30 <0.30 <0.30   BNP: No components found with this basename: POCBNP:5 CBG:  Lab 04/20/12 1549 04/20/12 1152 04/20/12 0841  GLUCAP 118* 88 85    Recent Results (from the past 240 hour(s))  MRSA PCR SCREENING     Status: Normal   Collection Time   04/20/12  1:04 AM      Component Value Range Status Comment   MRSA by PCR NEGATIVE  NEGATIVE Final      Scheduled Meds:   . antiseptic oral rinse  15 mL Mouth Rinse q12n4p  . aspirin  324 mg Oral Once  . chlorhexidine  15 mL Mouth Rinse BID  . dabigatran  150 mg Oral Q12H  . diltiazem  240 mg Oral Daily  . famotidine  20 mg Oral BID  . metoprolol succinate  50 mg Oral Daily  . nicotine  21 mg Transdermal Daily  . sodium chloride  3 mL Intravenous Q12H   Continuous Infusions:   . sodium chloride    . sodium chloride       Kevin Presto, MD  [facility] Pager [phone]  If 7PM-7AM, please contact night-coverage [email or web address] Password [email or web address] 04/22/2012, 2:43 PM   LOS: 3 days

## 2012-04-22 NOTE — Progress Notes (Addendum)
Subjective:  [name] is a 57 yo patient of Dr. Elvis Coil who presented with CP and atrial fibrillation.  He has a long hx of cocaine abuse - his UDS was + for cocaine this admission and an admission last year.   Echo showed normal LV function - EF of 65%.  Myoview showed an unusual pattern of mid septal attenuation.  This could be explained by coronary spasm ( possibly due to cocaine).  I have tentatively scheduled him for cath tomorrow.   He has been on Pradaxa and is scheduled for TEE guided cardioversion tomorrow Objective:  Filed Vitals:   04/22/12 0000 04/22/12 0400 04/22/12 0556 04/22/12 0745  BP: 98/57 102/58  110/64  Pulse: 66 72 85 84  Temp: 98.4 F (36.9 C) 98.2 F (36.8 C)  98.7 F (37.1 C)  TempSrc: Oral Oral  Oral  Resp: 22 23 23 16   Height:      Weight:   204 lb 2.3 oz (92.6 kg)   SpO2: 98% 98% 97% 98%    Intake/Output from previous day:  Intake/Output Summary (Last 24 hours) at 04/22/12 4098 Last data filed at 04/22/12 0400  Gross per 24 hour  Intake   1295 ml  Output   1175 ml  Net    120 ml    Physical Exam: Physical exam: Well-developed well-nourished in no acute distress.  Skin is warm and dry.  HEENT is normal.  Neck is supple.  Chest is clear to auscultation with normal expansion.  Cardiovascular exam is irregular  Abdominal exam nontender or distended. No masses palpated. Extremities show no edema. neuro grossly intact    Lab Results: Basic Metabolic Panel:  Basename 04/21/12 0448 04/20/12 0853  NA 136 136  K 4.6 4.9  CL 101 101  CO2 27 21  GLUCOSE 109* 81  BUN 16 8  CREATININE 1.08 0.66  CALCIUM 9.1 8.9  MG -- --  PHOS -- --   CBC:  Basename 04/21/12 0448 04/20/12 0500 04/19/12 1946  WBC 4.9 5.9 --  NEUTROABS -- -- 3.0  HGB 13.4 14.3 --  HCT 41.0 42.1 --  MCV 96.2 95.5 --  PLT 141* 157 --   Cardiac Enzymes:  Basename 04/20/12 1241 04/20/12 0500 04/19/12 2323  CKTOTAL -- -- --  CKMB -- -- --  CKMBINDEX -- -- --    TROPONINI <0.30 <0.30 <0.30     Assessment/Plan:  1 Atrial fibrillation - Patient complains of symptoms for approximately 4 days; plan continue cardizem and lopressor; titrate for rate control; continue Pradaxa and  proceed with TEE DCCV on Thursday after 5 full doses of pradaxa if atrial fibrillation persists. I do not think he would be a good long term anticoagulation candidate given history of ETOH abuse (may have contributed to atrial fibrillation).   Change IV dilt to PO  I told him that if he continues to use cocaine that he would likely go back into A-fib.  He claims that he has stopped using cocaine at this point.    2 Chest pain - he has continued to have episodes of CP.  The myoview showed a mid septal defect (identified by computer analysis, visually there was minimal attenuation).  He has known moderate CAD.  He agrees to have cath tomorrow.  Discussed risks, benefits, options, and limitations .  I informed him that continued cocaine use would likely cause continued coronary problems.  3 Elevated LFTs - being evaluated by primary care; possibly related to ETOH. 4  HTN - continue present meds 5 Tobacco abuse 6 ETOH abuse   Elyn Aquas. 04/22/2012, 8:23 AM

## 2012-04-23 ENCOUNTER — Encounter (HOSPITAL_COMMUNITY)
Admission: EM | Disposition: A | Discharge status: Home / Self Care | Payer: Self-pay | Source: Home / Self Care | Attending: Cardiology

## 2012-04-23 DIAGNOSIS — F1010 Alcohol abuse, uncomplicated: Secondary | ICD-10-CM

## 2012-04-23 DIAGNOSIS — I2510 Atherosclerotic heart disease of native coronary artery without angina pectoris: Secondary | ICD-10-CM

## 2012-04-23 LAB — COMPREHENSIVE METABOLIC PANEL
ALT: 145 U/L — ABNORMAL HIGH (ref 0–53)
Albumin: 3.5 g/dL (ref 3.5–5.2)
Alkaline Phosphatase: 77 U/L (ref 39–117)
BUN: 13 mg/dL (ref 6–23)
Chloride: 101 mEq/L (ref 96–112)
Potassium: 4.3 mEq/L (ref 3.5–5.1)
Sodium: 139 mEq/L (ref 135–145)
Total Bilirubin: 0.8 mg/dL (ref 0.3–1.2)

## 2012-04-23 LAB — CBC
MCH: 32.6 pg (ref 26.0–34.0)
MCHC: 33.6 g/dL (ref 30.0–36.0)
MCV: 96.9 fL (ref 78.0–100.0)
Platelets: 141 10*3/uL — ABNORMAL LOW (ref 150–400)
RDW: 13.4 % (ref 11.5–15.5)
WBC: 6.1 10*3/uL (ref 4.0–10.5)

## 2012-04-23 LAB — ANTI-SMOOTH MUSCLE ANTIBODY, IGG: F-Actin IgG: 34 U — ABNORMAL HIGH (ref <20)

## 2012-04-23 SURGERY — LEFT HEART CATHETERIZATION WITH CORONARY ANGIOGRAM
Anesthesia: LOCAL

## 2012-04-23 MED ORDER — SODIUM CHLORIDE 0.9 % IV SOLN: INTRAVENOUS | Status: DC

## 2012-04-23 MED ORDER — CLONAZEPAM 0.5 MG PO TABS: 0.5000 mg | ORAL_TABLET | Freq: Three times a day (TID) | ORAL | Status: DC

## 2012-04-23 MED ORDER — CLONAZEPAM 0.5 MG PO TABS
0.5000 mg | ORAL_TABLET | Freq: Three times a day (TID) | ORAL | Status: DC | PRN
  Administered 2012-04-23 – 2012-04-24 (×3): 0.5 mg via ORAL
  Filled 2012-04-23 (×3): qty 1

## 2012-04-23 NOTE — Progress Notes (Signed)
Pt getting in shower , I reminded pt that he was on the monitor and that he did not have order but he shower anyway against staffing advice  He took himself off of the monitor. No injury noted he place himself back on the monitor  Heart rate in the 90's

## 2012-04-23 NOTE — Progress Notes (Signed)
Subjective:  Kevin Orozco is a 57 yo patient of Dr. Elvis Coil who presented with CP and atrial fibrillation.  He has a long hx of cocaine abuse - his UDS was + for cocaine this admission and an admission last year.   Echo showed normal LV function - EF of 65%.  Myoview showed an unusual pattern of mid septal attenuation.  This could be explained by coronary spasm (possibly due to cocaine).   He has been on Pradaxa and is scheduled for TEE guided cardioversion tomorrow.  We had originally scheduled him for a cath but have cancelled this because he will need to stay on Pradaxa uninterrupted for his cardioversion.  His myoview is low-intermediate probability.  The defect was very atypical and may have been due to artifact vs. Coronary spasm.    He was upset that he could not get both done this admission but understood once I explained the details of each procedure.  Objective:  Filed Vitals:   04/23/12 0403 04/23/12 0500 04/23/12 0800 04/23/12 0852  BP:   131/97   Pulse:      Temp:    97.8 F (36.6 C)  TempSrc:    Oral  Resp:      Height:      Weight: 203 lb 14.8 oz (92.5 kg) 204 lb 9.4 oz (92.8 kg)    SpO2:        Intake/Output from previous day:  Intake/Output Summary (Last 24 hours) at 04/23/12 0905 Last data filed at 04/23/12 1191  Gross per 24 hour  Intake 773.25 ml  Output    500 ml  Net 273.25 ml    Physical Exam: Physical exam: Well-developed well-nourished in no acute distress.  Skin is warm and dry.  HEENT is normal.  Neck is supple.  Chest is clear to auscultation with normal expansion.  Cardiovascular exam is irregular  Abdominal exam nontender or distended. No masses palpated. Extremities show no edema. neuro grossly intact  Lab Results: Basic Metabolic Panel:  Basename 04/23/12 0505 04/21/12 0448  NA 139 136  K 4.3 4.6  CL 101 101  CO2 26 27  GLUCOSE 104* 109*  BUN 13 16  CREATININE 0.94 1.08  CALCIUM 9.5 9.1  MG -- --  PHOS -- --    CBC:  Basename 04/23/12 0505 04/21/12 0448  WBC 6.1 4.9  NEUTROABS -- --  HGB 14.5 13.4  HCT 43.1 41.0  MCV 96.9 96.2  PLT 141* 141*   Cardiac Enzymes:  Basename 04/20/12 1241  CKTOTAL --  CKMB --  CKMBINDEX --  TROPONINI <0.30     Assessment/Plan:  1 Atrial fibrillation - we were unable to schedule the TEE cardioversion for today.   Have scheduled it for tomorrow.    I told him that if he continues to use cocaine that he would likely go back into A-fib.  He claims that he has stopped using cocaine at this point.    2 Chest pain - his CP is atypical and his cardiac enzymes are negative.  I thought the myoview looked fairly normal visually but the computer interpretation identified a mid septal abnormality.   I gave him the choice of doing the cath or doing the TEE cardioversion and he chose to keep the cardioversion scheduled.  There is a good chance that the cath will not reveal any significant changes from his cath in 2008.   3 Elevated LFTs - being evaluated by primary care; possibly related to ETOH. 4 HTN -  continue present meds 5 Tobacco abuse 6 ETOH abuse   Elyn Aquas. 04/23/2012, 9:05 AM

## 2012-04-23 NOTE — Progress Notes (Signed)
PHARMACIST - PHYSICIAN COMMUNICATION DR:   Elease Hashimoto  CONCERNING:  Dabigatran (Pradaxa)  DESCRIPTION:  57yo male admitted with CP and atrial fibrillation.  He was started on oral Dabigatran for stroke risk reduction.  I discussed in-depth this new medication with him including the following information: 1.  Why it was prescribed. 2.  Bleeding risk and what to look for and when to call the physician. 3.  How to take this medication - skipped doses and the need to be consistent.  RECOMMENDATION: 1.  Please include the Pradaxa discharge instruction sheet that has been placed on his hard chart at discharge if you plan to continue this medication therapy.  Nadara Mustard, PharmD., MS Clinical Pharmacist Pager:  [phone] Thank you for allowing pharmacy to be part of this patients care team.

## 2012-04-23 NOTE — Progress Notes (Signed)
Pt not happy he wants his klonopin back to prn 3 x day  texted Dr. Izola Price pt request

## 2012-04-23 NOTE — Progress Notes (Signed)
Patient ID: Kevin Orozco, male   DOB: 1955/06/25, 57 y.o.   MRN: [id number]  [facility] PROGRESS NOTE  Chancy Smigiel WUJ:[id number] DOB: 12-May-1955 DOA: 04/19/2012 PCP: Tomi Bamberger, NP  Brief narrative:  Pt is 57 yo patient of Dr. Elvis Coil who presented with CP and atrial fibrillation. He has a long hx of cocaine abuse - his UDS was + for cocaine this admission and an admission last year. Echo showed normal LV function - EF of 65%. Myoview showed an unusual pattern of mid septal attenuation. This could be explained by coronary spasm ( possibly due to cocaine). I have tentatively scheduled him for cath tomorrow.   Assessment/Plan:  Transaminitis  -Patient has history of chronic alcohol abuse, he also tested positive for hepatitis C antibodies.  -Abdominal ultrasound showed no abnormalities.  -[facility] be [facility] to [facility] as outpatient.  - LFT's continue to trend down  Hyponatremia  -Likely water intoxication from beer potomania  -This is resolved and within normal limits this AM Chest pain  -Patient denies chest pain this AM  - Myoview results noted below  - cardiology following and will continue to follow up on recommendations Atrial fibrillation  -Per primary team, patient will have TEE and direct current cardioversion, unsure when is all scheduled, will discuss with cardio Alcohol abuse  -Patient said he drinks 8-12 beers on weekend and infrequently.  -Never had withdrawal symptoms before, if he developed withdrawal symptoms CIWA protocol.   Consultants:  [facility] - consulting, cardio is primary team    Procedures/Studies:  US Abdomen Complete  04/21/2012  Small bilateral renal cysts. No acute findings.  Nm Myocar Multi W/spect W/wall Motion / Ef  04/21/2012  This is interpreted as an intermediate risk Myoview study. He has some mild ischemia in the mid septal wall which improved slightly. The SDS is 7. He has known moderate coronary artery disease by  cardiac catheterization several years ago. He has continued to use cocaine. We were discussed these results with the patient.   Antibiotics:  None  Code Status: Full  Family Communication: Pt at bedside  Disposition Plan: Home when medically stable   HPI/Subjective: No events overnight. Pt denies chest pain or shortness of breath this AM.  Objective: Filed Vitals:   04/23/12 0500 04/23/12 0800 04/23/12 0852 04/23/12 1013  BP:  131/97  130/69  Pulse:      Temp:   97.8 F (36.6 C)   TempSrc:   Oral   Resp:      Height:      Weight: 92.8 kg (204 lb 9.4 oz)     SpO2:        Intake/Output Summary (Last 24 hours) at 04/23/12 1140 Last data filed at 04/23/12 1000  Gross per 24 hour  Intake 1053.25 ml  Output    500 ml  Net 553.25 ml    Exam:   General:  Pt is alert, follows commands appropriately, not in acute distress  Cardiovascular: Irregular rate and rhythm, S1/S2, no murmurs, no rubs, no gallops  Respiratory: Clear to auscultation bilaterally, no wheezing, no crackles, no rhonchi  Abdomen: Soft, non tender, non distended, bowel sounds present, no guarding  Extremities: No edema, pulses DP and PT palpable bilaterally  Neuro: Grossly nonfocal  Data Reviewed: Basic Metabolic Panel:  Lab 04/23/12 9562 04/21/12 0448 04/20/12 0853 04/20/12 0500 04/19/12 1946  NA 139 136 136 135 122*  K 4.3 4.6 4.9 4.7 4.6  CL 101 101 101 102 90*  CO2 26  27 21 22  18*  GLUCOSE 104* 109* 81 75 87  BUN 13 16 8 8 9   CREATININE 0.94 1.08 0.66 0.77 0.71  CALCIUM 9.5 9.1 8.9 8.3* 8.3*  MG -- -- -- -- --  PHOS -- -- -- -- --   Liver Function Tests:  Lab 04/23/12 0505 04/21/12 0448 04/20/12 0853 04/19/12 1946  AST 105* 175* 287* 251*  ALT 145* 186* 244* 225*  ALKPHOS 77 80 76 79  BILITOT 0.8 0.7 1.1 0.5  PROT 7.6 7.4 7.7 7.6  ALBUMIN 3.5 3.4* 3.8 3.8   CBC:  Lab 04/23/12 0505 04/21/12 0448 04/20/12 0500 04/19/12 1946  WBC 6.1 4.9 5.9 7.4  NEUTROABS -- -- -- 3.0  HGB 14.5  13.4 14.3 14.8  HCT 43.1 41.0 42.1 42.0  MCV 96.9 96.2 95.5 93.3  PLT 141* 141* 157 185   Cardiac Enzymes:  Lab 04/20/12 1241 04/20/12 0500 04/19/12 2323  CKTOTAL -- -- --  CKMB -- -- --  CKMBINDEX -- -- --  TROPONINI <0.30 <0.30 <0.30   CBG:  Lab 04/22/12 2106 04/20/12 1549 04/20/12 1152 04/20/12 0841  GLUCAP 98 118* 88 85    Recent Results (from the past 240 hour(s))  MRSA PCR SCREENING     Status: Normal   Collection Time   04/20/12  1:04 AM      Component Value Range Status Comment   MRSA by PCR NEGATIVE  NEGATIVE Final      Scheduled Meds:   . dabigatran  150 mg Oral Q12H  . diltiazem  240 mg Oral Daily  . famotidine  20 mg Oral BID  . metoprolol succinate  50 mg Oral Daily  . nicotine  21 mg Transdermal Daily   Continuous Infusions:   . sodium chloride       Debbora Presto, MD  [facility] Pager [phone]  If 7PM-7AM, please contact night-coverage [email or web address] Password [email or web address] 04/23/2012, 11:40 AM   LOS: 4 days

## 2012-04-24 ENCOUNTER — Encounter (HOSPITAL_COMMUNITY)
Admission: EM | Disposition: A | Discharge status: Home / Self Care | Payer: Self-pay | Source: Home / Self Care | Attending: Cardiology

## 2012-04-24 ENCOUNTER — Encounter (HOSPITAL_COMMUNITY): Payer: Self-pay | Admitting: Gastroenterology

## 2012-04-24 DIAGNOSIS — R768 Other specified abnormal immunological findings in serum: Secondary | ICD-10-CM

## 2012-04-24 DIAGNOSIS — F1410 Cocaine abuse, uncomplicated: Secondary | ICD-10-CM

## 2012-04-24 DIAGNOSIS — I201 Angina pectoris with documented spasm: Secondary | ICD-10-CM

## 2012-04-24 DIAGNOSIS — I480 Paroxysmal atrial fibrillation: Secondary | ICD-10-CM

## 2012-04-24 DIAGNOSIS — R079 Chest pain, unspecified: Secondary | ICD-10-CM

## 2012-04-24 LAB — BASIC METABOLIC PANEL
BUN: 12 mg/dL (ref 6–23)
Calcium: 9.4 mg/dL (ref 8.4–10.5)
GFR calc Af Amer: >90 mL/min (ref >90)
GFR calc non Af Amer: >90 mL/min (ref >90)
Glucose, Bld: 101 mg/dL — ABNORMAL HIGH (ref 70–99)
Potassium: 4.4 mEq/L (ref 3.5–5.1)
Sodium: 138 mEq/L (ref 135–145)

## 2012-04-24 LAB — CBC
Hemoglobin: 14.3 g/dL (ref 13.0–17.0)
MCH: 32.4 pg (ref 26.0–34.0)
MCHC: 33.4 g/dL (ref 30.0–36.0)
RDW: 13.3 % (ref 11.5–15.5)

## 2012-04-24 SURGERY — CANCELLED PROCEDURE

## 2012-04-24 SURGERY — Surgical Case
Anesthesia: *Unknown

## 2012-04-24 MED ORDER — NICOTINE 14 MG/24HR TD PT24: 1.0000 | MEDICATED_PATCH | TRANSDERMAL | Status: AC

## 2012-04-24 MED ORDER — FAMOTIDINE 20 MG PO TABS: 20.0000 mg | ORAL_TABLET | Freq: Two times a day (BID) | ORAL | Status: DC

## 2012-04-24 MED ORDER — DABIGATRAN ETEXILATE MESYLATE 150 MG PO CAPS: 150.0000 mg | ORAL_CAPSULE | Freq: Two times a day (BID) | ORAL | Status: DC

## 2012-04-24 MED ORDER — NICOTINE 21 MG/24HR TD PT24: 1.0000 | MEDICATED_PATCH | Freq: Every day | TRANSDERMAL | Status: AC

## 2012-04-24 MED ORDER — DILTIAZEM HCL ER COATED BEADS 240 MG PO CP24: 240.0000 mg | ORAL_CAPSULE | Freq: Every day | ORAL | Status: DC

## 2012-04-24 MED ORDER — NICOTINE 7 MG/24HR TD PT24: 1.0000 | MEDICATED_PATCH | TRANSDERMAL | Status: AC

## 2012-04-24 NOTE — Progress Notes (Addendum)
Subjective:  Mr. Holtsclaw is a 57 yo patient of Dr. Elvis Coil who presented with CP and atrial fibrillation.  He has a long hx of cocaine abuse - his UDS was + for cocaine this admission and an admission last year.   Echo showed normal LV function - EF of 65%.  Myoview showed an unusual pattern of mid septal attenuation.  This could be explained by coronary spasm (possibly due to cocaine).   He has been on Pradaxa and is scheduled for TEE guided cardioversion tomorrow.  We had originally scheduled him for a cath but have cancelled this because he will need to stay on Pradaxa uninterrupted for his cardioversion.  His myoview is low-intermediate probability.  The defect was very atypical and may have been due to artifact vs. Coronary spasm.    He is scheduled for TEE / Cardioversion today at 9 AM  Objective:  Filed Vitals:   04/23/12 2344 04/23/12 2345 04/24/12 0355 04/24/12 0500  BP:  111/80 104/60   Pulse:      Temp: 98.8 F (37.1 C)  97.9 F (36.6 C)   TempSrc: Oral  Oral   Resp:      Height:      Weight:    205 lb 4 oz (93.1 kg)  SpO2: 97%  94%     Intake/Output from previous day:  Intake/Output Summary (Last 24 hours) at 04/24/12 0745 Last data filed at 04/23/12 1000  Gross per 24 hour  Intake    550 ml  Output    125 ml  Net    425 ml    Physical Exam: Physical exam: Well-developed well-nourished in no acute distress.  Skin is warm and dry.  HEENT is normal.  Neck is supple.  Chest is clear to auscultation with normal expansion.  Cardiovascular exam is irregular  Abdominal exam nontender or distended. No masses palpated. Extremities show no edema. neuro grossly intact  Lab Results: Basic Metabolic Panel:  Basename 04/24/12 0551 04/23/12 0505  NA 138 139  K 4.4 4.3  CL 103 101  CO2 25 26  GLUCOSE 101* 104*  BUN 12 13  CREATININE 0.90 0.94  CALCIUM 9.4 9.5  MG -- --  PHOS -- --   CBC:  Basename 04/24/12 0551 04/23/12 0505  WBC 5.8 6.1  NEUTROABS  -- --  HGB 14.3 14.5  HCT 42.8 43.1  MCV 96.8 96.9  PLT 144* 141*   Cardiac Enzymes: No results found for this basename: CKTOTAL:3,CKMB:3,CKMBINDEX:3,TROPONINI:3 in the last 72 hours   Assessment/Plan:  1 Atrial fibrillation - for TEE/Cardioversion today  I told him that if he continues to use cocaine that he would likely go back into A-fib.  He claims that he has stopped using cocaine at this point.    2 Chest pain - his CP is atypical and his cardiac enzymes are negative.  I thought the myoview looked fairly normal visually but the computer interpretation identified a mid septal abnormality.   I gave him the choice of doing the cath or doing the TEE cardioversion and he chose to keep the cardioversion scheduled.  There is a good chance that the cath will not reveal any significant changes from his cath in 2008.   3 Elevated LFTs - being evaluated by primary care; possibly related to ETOH. 4 HTN - continue present meds 5 Tobacco abuse 6 ETOH abuse   Elyn Aquas. 04/24/2012, 7:45 AM   Addendum:  Pt converted to NSR on his way  to the endoscopy suite.  TEE and cardioversion were cancelled.  He may be DC to home today.  He already feels much better.  Continue current meds.   He may not even need a cath if he does well   To follow up with Dr. Swaziland in a month or so.    Vesta Mixer, Montez Hageman., MD, [facility] 04/24/2012, 9:24 AM Office - [phone] Pager [phone]

## 2012-04-24 NOTE — Progress Notes (Signed)
Reviewed discharge instructions with patient. Patient verbally understood and signed the form. Patient is awaiting transportation at this time. Will continue to monitor until patient is discharged.   Kevin Orozco, [name]

## 2012-04-24 NOTE — Discharge Summary (Signed)
Discharge Summary   Patient ID: Kevin Orozco,  MRN: [id number], DOB/AGE: 10/12/1955 57 y.o.  Admit date: 04/19/2012 Discharge date: 04/24/2012  Primary Physician: Tomi Bamberger, NP Primary Cardiologist: Swaziland, Peter, MD  Discharge Diagnoses Principal Problem:  *Paroxysmal atrial fibrillation  - cocaine/alcohol-induced, moderate LA dilatation echo, EF 55-60%, moderate LVH  - started on Pradaxa, rate-controlled on metoprolol and diltiazem  - TEE/DCCV planned, but cancelled due to spontaneous conversion to SR Active Problems:  CAD (coronary artery disease), nonobstructive  Cocaine abuse  Coronary vasospasm   - UDS +  - Lexiscan Myoview 04/21/2012: intermediate risk, some mild ischemic in the mid septal wall, known moderate coronary artery disease, focal slight attenuation of mid septal regions  - TnI WNL x 5  - cath planned, but then cancelled given suspicion for cocaine-induced vasospasm  Hyperlipidemia  Hypertension  Acute alcohol abuse  - on CIWA protocol  - no evidence of DTs  Transaminitis   - secondary to acute EtOH abuse with evidence for hepatitis C   - abdominal ultrasound unremarkable  - ANA, mitochondrial Ab negative  - Hep B C Igm, Hep B surface and Hep A IgM negative  - HCV antibody positive Hyponatremia  - suspected water intoxication from beer potomania   Allergies No Known Allergies  Diagnostic Studies/Procedures  PORTABLE CHEST X-RAY - 04/19/12  IMPRESSION:  No acute findings.  ABDOMINAL ULTRASOUND - 04/21/12  IMPRESSION:  Small bilateral renal cysts. No acute findings.  TRANSTHORACIC ECHOCARDIOGRAM - 04/21/12  - Left ventricle: The cavity size was normal. Wall thickness was increased in a pattern of moderate LVH. Systolic function was normal. The estimated ejection fraction was in the range of 60% to 65%. - Left atrium: The atrium was moderately dilated.  LEXISCAN MYOVIEW - 04/21/12  Impression: This is interpreted as an intermediate risk  Myoview study. He has some mild ischemia in the mid septal wall which improved slightly. The SDS is 7. He has known moderate coronary artery disease by cardiac catheterization several years ago. He has continued to use cocaine. We were discussed these results with the patient.  History of Present Illness  Kevin Orozco is a 57 yo who was admitted to [facility] on 04/19/12 with the above problem list. He has a history of CAD s/p cath in 2008 revealing nonobstructive CAD with moderate LAD stenosis, mild LCx and RCA stenoses. Negative Cardiolite in 2010. He also has a history of tobacco, EtOH and cocaine abuse. He reported consuming 8-12 beers the weekend of admission. He reported intermittent chest pressure the day prior and day of admission. He presented to the ED at the advice of his friend. There, EKG revealed no acute ischemia, but did indicate new onset atrial fibrillation with RVR. POC TnI returned WNL. Rate was controlled on diltiazem, and he was heparinized. CMET indicated hyponatremia and transaminitis. UDS was positive for cocaine. Given new onset of a-fib with RVR and chest discomfort concerning for unstable angina with his cardiac history, he was subsequently admitted for further evaluation and management.   Hospital Course   The medicine service was consulted regarding his chronic medical issues, transaminitis and hyponatremia. 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 Autoimmune etiologies were ruled out with negative ANA and mitochondrial Ab. Abdominal ultrasound was unremarkable. Acute alcoholic hepatitis was primarily suspected. Hyponatremia was attributed to mild water intoxication and beer potomania. This improved with free water and fluid restriction. There was a question of prior history of type 2 DM. Hgb A1C this  admission returned at 5.1%.  From a cardiac standpoint, a total of five sets of troponin-I were obtained and returned WNL. As above, TTE was  performed revealing LVEF 55-60%, moderate LVH and moderate LA dilatation. He underwent Lexiscan Myoview revealing a localized septal defect. Initially, cardiac catheterization was planned given his cardiac history. This was reviewed by Dr. Elease Hashimoto, and the plan was made to cancel cath as cocaine-induced vasospasm +/- a-fib was believed to cause his chest discomfort. Indeed, this improved through the admission. Atrial fibrillation was rate-controlled on outpatient Toprol-XL (started several days after last cocaine use) and Cardizem.  Pradaxa was initiated for anticoagulation. He remained in atrial fibrillation and the plan was made to pursue TEE/DCCV, however this too was cancelled after he spontaneously converted to SR the morning of the procedure.   Through the admission, he was fairly impatient and expressed his frustration on the nursing staff. He demanded to be discharged this morning or he would leave AMA, and Dr. Elease Hashimoto felt he was stable for discharge. The medications below will be continued. He will follow-up with Dr. Swaziland in ~ 2-4 weeks. He have been advised to follow-up with his PCP for further evaluation/management of positive HCV this admission. Abstinence from EtOH and cocaine abuse has been stressed. He will be discharged with nicotine replacement therapy in an effort to assist with tobacco cessation. This information, including supplemental atrial fibrillation material, has been clearly outlined in the discharge AVS.   Discharge Vitals:  Blood pressure 120/74, pulse 78, temperature 98 F (36.7 C), temperature source Oral, resp. rate 15, height 5\' 11"  (1.803 m), weight 93.1 kg (205 lb 4 oz), SpO2 97.00%.   Weight change: 0.6 kg (1 lb 5.2 oz)  Labs: Recent Labs  [facility] 04/24/12 0551 04/23/12 0505   WBC 5.8 6.1   HGB 14.3 14.5   HCT 42.8 43.1   MCV 96.8 96.9   PLT 144* 141*    Lab 04/24/12 0551 04/23/12 0505 04/21/12 0448  NA 138 139 136  K 4.4 4.3 4.6  CL 103 101 101  CO2 25  26 27   BUN 12 13 16   CREATININE 0.90 0.94 1.08  CALCIUM 9.4 9.5 9.1  PROT -- 7.6 --  BILITOT -- 0.8 --  ALKPHOS -- 77 --  ALT -- 145* --  AST -- 105* --  AMYLASE -- -- --  LIPASE -- -- --  GLUCOSE 101* 104* 109*   Disposition:  Discharge Orders    Future Appointments: Provider: Department: Dept Phone: Center:   05/27/2012 11:30 AM Peter M Swaziland, MD [place] [facility] Main Office [place]) [phone] [facility]     Future Orders Please Complete By Expires   Diet - low sodium heart healthy      Increase activity slowly        Follow-up Information    Follow up with Peter Swaziland, MD. On 05/27/2012. (At 11:30 AM for follow-up. )    Contact information:   [address] [place] [address] [phone]       Follow up with Tomi Bamberger, NP. (For follow-up after this hospitalization. )    Contact information:   [address] [place] [address] [phone]         Discharge Medications:    Medication List     As of 04/24/2012 10:10 AM    START taking these medications         dabigatran 150 MG Caps   Commonly known as: PRADAXA   Take 1  capsule (150 mg total) by mouth every 12 (twelve) hours.      diltiazem 240 MG 24 hr capsule   Commonly known as: CARDIZEM CD   Take 1 capsule (240 mg total) by mouth daily.      * nicotine 21 mg/24hr patch   Commonly known as: NICODERM CQ - dosed in mg/24 hours   Place 1 patch onto the skin daily.      * nicotine 14 mg/24hr patch   Commonly known as: NICODERM CQ - dosed in mg/24 hours   Place 1 patch onto the skin daily.   Start taking on: 05/23/2012      * nicotine 7 mg/24hr patch   Commonly known as: NICODERM CQ - dosed in mg/24 hr   Place 1 patch onto the skin daily.   Start taking on: 06/21/2012     * Notice: This list has 3 medication(s) that are the same as other medications prescribed for you. Read the directions carefully, and ask your doctor or other care provider to review them with you.      CONTINUE taking these medications         clonazePAM 0.5 MG tablet   Commonly known as: KLONOPIN      metoprolol succinate 50 MG 24 hr tablet   Commonly known as: TOPROL-XL      nitroGLYCERIN 0.4 MG SL tablet   Commonly known as: NITROSTAT          Where to get your medications    These are the prescriptions that you need to pick up. We sent them to a specific pharmacy, so you will need to go there to get them.   [facility] [place], [place] - [address]    [address] [place]  [id number]    Phone: [phone]        dabigatran 150 MG Caps   diltiazem 240 MG 24 hr capsule   nicotine 14 mg/24hr patch   nicotine 21 mg/24hr patch   nicotine 7 mg/24hr patch           Outstanding Labs/Studies: further hepatitis C work-up/GI referral per PCP  Duration of Discharge Encounter: Greater than 30 minutes including physician time.  Signed, R. Hurman Horn, PA-C 04/24/2012, 10:10 AM   Attending Note:  Please see note from earlier today.   Kristeen Miss, MD

## 2012-04-24 NOTE — Progress Notes (Signed)
Patient returned from TEE at 9 am. Didn't need procedure bc spontaneously converted into SR. HR 78. Patient took a shower and refused to place the leads back on. MD saw patient has ordered discharge but we are awaiting discharge instructions. Will continue to monitor.  Samai Corea, [name] RN

## 2012-04-27 ENCOUNTER — Telehealth: Payer: Self-pay | Admitting: Cardiology

## 2012-04-27 NOTE — Telephone Encounter (Signed)
Please don't leave a message on VM because she can not retrieve her messages. Pt has not had any of his meds filled since his hospital stay and Case management will start tomorrow. She wanted to make you aware and was checking to see we wanted her to so anything special or any other orders for her.

## 2012-04-27 NOTE — Telephone Encounter (Signed)
Returned call to [employer] from [employer] for community care no answer.LMTC.

## 2012-04-30 NOTE — Telephone Encounter (Signed)
Patient called no answer.LMTC. 

## 2012-04-30 NOTE — Telephone Encounter (Signed)
Returned call to Shelvia Battle from [facility] for community care no answer.LMTC. 

## 2012-05-05 NOTE — Telephone Encounter (Signed)
Spoke to [employer] from [employer] for [facility] she stated when patient was discharged from hospital he did not fill medication that was prescribed.Stated patient said he could not afford.She stated he also refused her services.

## 2012-05-27 ENCOUNTER — Encounter: Payer: Medicaid Other | Admitting: Cardiology

## 2012-06-16 ENCOUNTER — Encounter: Payer: Self-pay | Admitting: Cardiology

## 2012-10-17 ENCOUNTER — Emergency Department (HOSPITAL_COMMUNITY): Payer: Medicaid Other

## 2012-10-17 ENCOUNTER — Emergency Department (HOSPITAL_COMMUNITY)
Admission: EM | Admit: 2012-10-17 | Discharge: 2012-10-17 | Disposition: A | Discharge status: Home / Self Care | Payer: Medicaid Other | Attending: Emergency Medicine | Admitting: Emergency Medicine

## 2012-10-17 ENCOUNTER — Encounter (HOSPITAL_COMMUNITY): Payer: Self-pay | Admitting: Emergency Medicine

## 2012-10-17 DIAGNOSIS — I10 Essential (primary) hypertension: Secondary | ICD-10-CM | POA: Insufficient documentation

## 2012-10-17 DIAGNOSIS — Y92410 Unspecified street and highway as the place of occurrence of the external cause: Secondary | ICD-10-CM | POA: Insufficient documentation

## 2012-10-17 DIAGNOSIS — S42101A Fracture of unspecified part of scapula, right shoulder, initial encounter for closed fracture: Secondary | ICD-10-CM

## 2012-10-17 DIAGNOSIS — I4891 Unspecified atrial fibrillation: Secondary | ICD-10-CM | POA: Insufficient documentation

## 2012-10-17 DIAGNOSIS — Z8639 Personal history of other endocrine, nutritional and metabolic disease: Secondary | ICD-10-CM | POA: Insufficient documentation

## 2012-10-17 DIAGNOSIS — Z862 Personal history of diseases of the blood and blood-forming organs and certain disorders involving the immune mechanism: Secondary | ICD-10-CM | POA: Insufficient documentation

## 2012-10-17 DIAGNOSIS — Y9389 Activity, other specified: Secondary | ICD-10-CM | POA: Insufficient documentation

## 2012-10-17 DIAGNOSIS — Z9861 Coronary angioplasty status: Secondary | ICD-10-CM | POA: Insufficient documentation

## 2012-10-17 DIAGNOSIS — F17200 Nicotine dependence, unspecified, uncomplicated: Secondary | ICD-10-CM | POA: Insufficient documentation

## 2012-10-17 DIAGNOSIS — E119 Type 2 diabetes mellitus without complications: Secondary | ICD-10-CM | POA: Insufficient documentation

## 2012-10-17 DIAGNOSIS — F1410 Cocaine abuse, uncomplicated: Secondary | ICD-10-CM | POA: Insufficient documentation

## 2012-10-17 DIAGNOSIS — S42109A Fracture of unspecified part of scapula, unspecified shoulder, initial encounter for closed fracture: Secondary | ICD-10-CM | POA: Insufficient documentation

## 2012-10-17 DIAGNOSIS — Z79899 Other long term (current) drug therapy: Secondary | ICD-10-CM | POA: Insufficient documentation

## 2012-10-17 DIAGNOSIS — I2510 Atherosclerotic heart disease of native coronary artery without angina pectoris: Secondary | ICD-10-CM | POA: Insufficient documentation

## 2012-10-17 MED ORDER — HYDROMORPHONE HCL PF 1 MG/ML IJ SOLN
1.0000 mg | Freq: Once | INTRAMUSCULAR | Status: AC
  Administered 2012-10-17: 1 mg via INTRAMUSCULAR
  Filled 2012-10-17: qty 1

## 2012-10-17 MED ORDER — OXYCODONE-ACETAMINOPHEN 5-325 MG PO TABS: 1.0000 | ORAL_TABLET | ORAL | Status: DC | PRN
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
## 2012-10-17 NOTE — ED Provider Notes (Signed)
Medical screening examination/treatment/procedure(s) were performed by non-physician practitioner and as supervising physician I was immediately available for consultation/collaboration.   Rolan Bucco, MD 10/17/12 2326

## 2012-10-17 NOTE — ED Notes (Signed)
Patient transported to X-ray 

## 2014-01-18 ENCOUNTER — Emergency Department (HOSPITAL_COMMUNITY)
Admission: EM | Admit: 2014-01-18 | Discharge: 2014-01-19 | Discharge status: Against Medical Advice | Payer: Medicaid Other | Attending: Emergency Medicine | Admitting: Emergency Medicine

## 2014-01-18 ENCOUNTER — Emergency Department (HOSPITAL_COMMUNITY): Payer: Medicaid Other

## 2014-01-18 ENCOUNTER — Encounter (HOSPITAL_COMMUNITY): Payer: Self-pay | Admitting: Emergency Medicine

## 2014-01-18 DIAGNOSIS — R61 Generalized hyperhidrosis: Secondary | ICD-10-CM | POA: Insufficient documentation

## 2014-01-18 DIAGNOSIS — M549 Dorsalgia, unspecified: Secondary | ICD-10-CM | POA: Diagnosis not present

## 2014-01-18 DIAGNOSIS — D689 Coagulation defect, unspecified: Secondary | ICD-10-CM | POA: Insufficient documentation

## 2014-01-18 DIAGNOSIS — R079 Chest pain, unspecified: Secondary | ICD-10-CM | POA: Diagnosis not present

## 2014-01-18 DIAGNOSIS — J029 Acute pharyngitis, unspecified: Secondary | ICD-10-CM | POA: Diagnosis not present

## 2014-01-18 DIAGNOSIS — R51 Headache: Secondary | ICD-10-CM | POA: Insufficient documentation

## 2014-01-18 DIAGNOSIS — E119 Type 2 diabetes mellitus without complications: Secondary | ICD-10-CM | POA: Diagnosis not present

## 2014-01-18 DIAGNOSIS — H539 Unspecified visual disturbance: Secondary | ICD-10-CM | POA: Diagnosis not present

## 2014-01-18 DIAGNOSIS — I2510 Atherosclerotic heart disease of native coronary artery without angina pectoris: Secondary | ICD-10-CM | POA: Insufficient documentation

## 2014-01-18 DIAGNOSIS — F1010 Alcohol abuse, uncomplicated: Secondary | ICD-10-CM | POA: Diagnosis not present

## 2014-01-18 DIAGNOSIS — I10 Essential (primary) hypertension: Secondary | ICD-10-CM | POA: Insufficient documentation

## 2014-01-18 DIAGNOSIS — F10920 Alcohol use, unspecified with intoxication, uncomplicated: Secondary | ICD-10-CM

## 2014-01-18 DIAGNOSIS — F17200 Nicotine dependence, unspecified, uncomplicated: Secondary | ICD-10-CM | POA: Diagnosis not present

## 2014-01-18 LAB — CBC WITH DIFFERENTIAL/PLATELET
BASOS PCT: 1 % (ref 0–1)
Basophils Absolute: 0 10*3/uL (ref 0.0–0.1)
EOS ABS: 0.1 10*3/uL (ref 0.0–0.7)
Eosinophils Relative: 1 % (ref 0–5)
HCT: 42.7 % (ref 39.0–52.0)
Hemoglobin: 15 g/dL (ref 13.0–17.0)
Lymphocytes Relative: 56 % — ABNORMAL HIGH (ref 12–46)
Lymphs Abs: 3.5 10*3/uL (ref 0.7–4.0)
MCH: 34.4 pg — AB (ref 26.0–34.0)
MCHC: 35.1 g/dL (ref 30.0–36.0)
MCV: 97.9 fL (ref 78.0–100.0)
MONOS PCT: 13 % — AB (ref 3–12)
Monocytes Absolute: 0.8 10*3/uL (ref 0.1–1.0)
Neutro Abs: 1.8 10*3/uL (ref 1.7–7.7)
Neutrophils Relative %: 29 % — ABNORMAL LOW (ref 43–77)
PLATELETS: 139 10*3/uL — AB (ref 150–400)
RBC: 4.36 MIL/uL (ref 4.22–5.81)
RDW: 12.8 % (ref 11.5–15.5)
WBC: 6.2 10*3/uL (ref 4.0–10.5)

## 2014-01-18 LAB — COMPREHENSIVE METABOLIC PANEL
ALT: 186 U/L — ABNORMAL HIGH (ref 0–53)
ANION GAP: 12 (ref 5–15)
AST: 278 U/L — ABNORMAL HIGH (ref 0–37)
Albumin: 3.7 g/dL (ref 3.5–5.2)
Alkaline Phosphatase: 90 U/L (ref 39–117)
BUN: <3 mg/dL — ABNORMAL LOW (ref 6–23)
CALCIUM: 8.4 mg/dL (ref 8.4–10.5)
CO2: 24 mEq/L (ref 19–32)
Chloride: 99 mEq/L (ref 96–112)
Creatinine, Ser: 0.59 mg/dL (ref 0.50–1.35)
GFR calc non Af Amer: >90 mL/min (ref >90)
GLUCOSE: 104 mg/dL — AB (ref 70–99)
Potassium: 3.7 mEq/L (ref 3.7–5.3)
SODIUM: 135 meq/L — AB (ref 137–147)
TOTAL PROTEIN: 8.2 g/dL (ref 6.0–8.3)
Total Bilirubin: 0.7 mg/dL (ref 0.3–1.2)

## 2014-01-18 LAB — ETHANOL: Alcohol, Ethyl (B): 297 mg/dL — ABNORMAL HIGH (ref 0–11)

## 2014-01-18 LAB — TROPONIN I
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
## 2014-01-18 NOTE — ED Notes (Signed)
Dr. Zackowski at bedside  

## 2014-01-18 NOTE — ED Notes (Signed)
Pt. C/o left face numbness and left shoulder numbness. Pt. Refusing back x-ray. Explained risks to pt. Pt. Stating "I do not want that, just take me home." Dr. Deretha EmoryZackowski notified.

## 2014-01-18 NOTE — ED Provider Notes (Signed)
CSN: [id number]     Arrival date & time 01/18/14  2007 History   This chart was scribed for Vanetta Mulders, MD, by Yevette Edwards, ED Scribe. This patient was seen in room APA02/APA02 and the patient's care was started at 8:44 PM.  First MD Initiated Contact with Patient 01/18/14 2027     Chief Complaint  Patient presents with  . Chest Pain    Patient is a 58 y.o. male presenting with chest pain. The history is provided by the patient and the police. No language interpreter was used.  Chest Pain Pain location:  L chest Pain radiates to:  L jaw Pain severity:  Severe (10/10) Onset quality:  Sudden Duration:  2 hours Progression:  Improving Chronicity:  Recurrent Context comment:  Emotional distress Relieved by:  Rest Worsened by:  Nothing tried Ineffective treatments:  None tried Associated symptoms: back pain, cough, diaphoresis, headache and shortness of breath   Associated symptoms: no abdominal pain, no fever, no nausea and not vomiting   Risk factors: coronary artery disease, diabetes mellitus, hypertension, male sex and smoking    HPI Comments:  Kevin Orozco is a 58 y.o. male, with a h/o CAD, HTN, and atrial fibrillation, who presents to the Emergency Department complaining of acute-onset left lower chest pain which began after he was arrested by the [facility] Department approximately two hours ago and was 10/10 at its most severe. The pt states the chest pain initially radiated to his jaw; he endorses numbness to his jaw with the pain. He also endorses diaphoresis and SOB increased from baseline with the chest pain. Additionally, he voices visual changes associated with the chest pain. He denies nausea or emesis.At bedside, the pt reports the chest pain is improving; he rates the chest pain as 2/10. He reports a h/o similar chest pain with emotional exertion or illness.  The pt states he had a cardiac stent placed approximately nine months ago at [facility]. He is a  current smoker. He has a h/o alcohol and cocaine abuse.   Per [employer], the pt was arrested due to a warrant for an assault on a male.   Dr. Tomi Bamberger in [place] is his PCP.   Past Medical History  Diagnosis Date  . DM type 2 (diabetes mellitus, type 2)   . CAD (coronary artery disease)     a. Cath 2008- nonobstructive, mod LAD, mild LCx & RCA disease b. Cardiolite 2010 normal c. 03/2012 abnormal Lexiscan Myoview attributed to cocaine d. 03/2012 echo: EF 55-60%, moderate LVH, moderate LA dilatation  . HTN (hypertension)   . Hypercholesteremia   . Atrial fibrillation with RVR 04/19/12    New onset; spontaneous conversion to NSR; Pradaxa anticoagulation  . Cocaine abuse   . 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 ETOH abuse   . Tobacco abuse    Past Surgical History  Procedure Laterality Date  . Appendectomy    . Foot surgery     Family History  Problem Relation Age of Onset  . Heart attack Father 41  . Diabetes Mother 38   History  Substance Use Topics  . Smoking status: Current Every Day Smoker -- 3.00 packs/day for 40 years  . Smokeless tobacco: Never Used  . Alcohol Use: 6.0 oz/week    12 drink(s) per week     Comment: socially  Review of Systems  Constitutional: Positive for diaphoresis. Negative for fever and chills.  HENT: Positive for congestion, rhinorrhea and sore throat.   Eyes: Positive for visual disturbance.  Respiratory: Positive for cough and shortness of breath.   Cardiovascular: Positive for chest pain and leg swelling (Baseline).  Gastrointestinal: Negative for nausea, vomiting, abdominal pain and diarrhea.  Genitourinary: Negative for dysuria.  Musculoskeletal: Positive for back pain.  Skin: Negative for rash.  Neurological: Positive for headaches.  Hematological: Bruises/bleeds easily.     Allergies  Review of  patient's allergies indicates no known allergies.  Home Medications   Prior to Admission medications   Medication Sig Start Date End Date Taking? Authorizing Provider  nitroGLYCERIN (NITROSTAT) 0.4 MG SL tablet Place 0.4 mg under the tongue every 5 (five) minutes as needed for chest pain.    Yes Historical Provider, MD   Triage Vitals: BP 175/110  Pulse 105  Temp(Src) 98.3 F (36.8 C) (Oral)  Resp 20  Ht 5\' 11"  (1.803 m)  Wt 210 lb (95.255 kg)  BMI 29.30 kg/m2  SpO2 97%  Physical Exam  Constitutional: No distress.  HENT:  Head: Normocephalic and atraumatic.  Eyes: EOM are normal.  Neck: Normal range of motion.  Cardiovascular: Regular rhythm and normal heart sounds.   Slightly tachycardic. Rate of 113.   Pulmonary/Chest: Effort normal and breath sounds normal. No respiratory distress. He has no wheezes. He has no rales.  Abdominal: Bowel sounds are normal. There is no tenderness.  Musculoskeletal: He exhibits no edema.  Neurological: He is alert. No cranial nerve deficit.  Skin: Skin is warm and dry.    ED Course  Procedures (including critical care time)  DIAGNOSTIC STUDIES: Oxygen Saturation is 97% on room air, normal by my interpretation.    COORDINATION OF CARE:  8:52 PM- Discussed treatment plan with patient, and the patient agreed to the plan which includes lab work. The pt refuses any medication.   Results for orders placed during the hospital encounter of 01/18/14  ETHANOL      Result Value Ref Range   Alcohol, Ethyl (B) 297 (*) 0 - 11 mg/dL  CBC WITH DIFFERENTIAL      Result Value Ref Range   WBC 6.2  4.0 - 10.5 K/uL   RBC 4.36  4.22 - 5.81 MIL/uL   Hemoglobin 15.0  13.0 - 17.0 g/dL   HCT 16.1  09.6 - 04.5 %   MCV 97.9  78.0 - 100.0 fL   MCH 34.4 (*) 26.0 - 34.0 pg   MCHC 35.1  30.0 - 36.0 g/dL   RDW 40.9  81.1 - 91.4 %   Platelets 139 (*) 150 - 400 K/uL   Neutrophils Relative % 29 (*) 43 - 77 %   Neutro Abs 1.8  1.7 - 7.7 K/uL   Lymphocytes  Relative 56 (*) 12 - 46 %   Lymphs Abs 3.5  0.7 - 4.0 K/uL   Monocytes Relative 13 (*) 3 - 12 %   Monocytes Absolute 0.8  0.1 - 1.0 K/uL   Eosinophils Relative 1  0 - 5 %   Eosinophils Absolute 0.1  0.0 - 0.7 K/uL   Basophils Relative 1  0 - 1 %   Basophils Absolute 0.0  0.0 - 0.1 K/uL  COMPREHENSIVE METABOLIC PANEL      Result Value Ref Range   Sodium 135 (*) 137 - 147 mEq/L   Potassium 3.7  3.7 - 5.3 mEq/L   Chloride 99  96 -  112 mEq/L   CO2 24  19 - 32 mEq/L   Glucose, Bld 104 (*) 70 - 99 mg/dL   BUN <3 (*) 6 - 23 mg/dL   Creatinine, Ser 3.320.59  0.50 - 1.35 mg/dL   Calcium 8.4  8.4 - 95.110.5 mg/dL   Total Protein 8.2  6.0 - 8.3 g/dL   Albumin 3.7  3.5 - 5.2 g/dL   AST 884278 (*) 0 - 37 U/L   ALT 186 (*) 0 - 53 U/L   Alkaline Phosphatase 90  39 - 117 U/L   Total Bilirubin 0.7  0.3 - 1.2 mg/dL   GFR calc non Af Amer >90  >90 mL/min   GFR calc Af Amer >90  >90 mL/min   Anion gap 12  5 - 15  TROPONIN I      Result Value Ref Range   Troponin I <0.30  <0.30 ng/mL   No results found.   Imaging Review Dg Chest Port 1 View  01/18/2014   CLINICAL DATA:  Left-sided chest pain.  EXAM: PORTABLE CHEST - 1 VIEW  COMPARISON:  10/17/2012.  FINDINGS: Mediastinum and hilar structures are unremarkable. Stable cardiomegaly. Pulmonary vascularity normal. No focal infiltrate. No pleural effusion or pneumothorax. No acute bony abnormality .  IMPRESSION: Stable cardiomegaly.  No CHF.  No acute pulmonary disease.   Electronically Signed   By: Maisie Fushomas  Register   On: 01/18/2014 21:12     EKG Interpretation None       Date: 01/18/2014  Rate: 105  Rhythm: sinus tachycardia and premature atrial contractions (PAC)  QRS Axis: normal  Intervals: normal  ST/T Wave abnormalities: nonspecific T wave changes  Conduction Disutrbances:none  Narrative Interpretation:   Old EKG Reviewed: none available      MDM   Final diagnoses:  Chest pain, unspecified chest pain type  Alcohol intoxication,  uncomplicated    Patient brought in by police he was being arrested for abuse. Domestic abuse. Patient does have alcohol intoxication. Patient states that he's had chest pain on and off throughout the past few days but got worse when he got arrested. Troponins negative EKG without any acute changes. Patient's liver function tests are consistent with the alcohol effect. No significant elevation in the bilirubin. Patient was also complaining of lumbar back pain he said the police injured him. Patient now refusing x-ray of the lumbar back. Patient's functional has been cleared from the chest pain component. But not able to clear the back. Patient understands the risk there could be an injury could result in paralysis or other permanent sequelae. Patient insists on leaving.  I personally performed the services described in this documentation, which was scribed in my presence. The recorded information has been reviewed and is accurate.     Vanetta MuldersScott Alvena Kiernan, MD 01/18/14 2222

## 2014-01-19 ENCOUNTER — Emergency Department (HOSPITAL_COMMUNITY)
Admission: EM | Admit: 2014-01-19 | Discharge: 2014-01-19 | Disposition: A | Discharge status: Home / Self Care | Payer: Medicaid Other | Attending: Emergency Medicine | Admitting: Emergency Medicine

## 2014-01-19 ENCOUNTER — Encounter (HOSPITAL_COMMUNITY): Payer: Self-pay | Admitting: Emergency Medicine

## 2014-01-19 DIAGNOSIS — I10 Essential (primary) hypertension: Secondary | ICD-10-CM | POA: Insufficient documentation

## 2014-01-19 DIAGNOSIS — I2510 Atherosclerotic heart disease of native coronary artery without angina pectoris: Secondary | ICD-10-CM | POA: Diagnosis not present

## 2014-01-19 DIAGNOSIS — R079 Chest pain, unspecified: Secondary | ICD-10-CM | POA: Diagnosis present

## 2014-01-19 DIAGNOSIS — F17200 Nicotine dependence, unspecified, uncomplicated: Secondary | ICD-10-CM | POA: Diagnosis not present

## 2014-01-19 DIAGNOSIS — E119 Type 2 diabetes mellitus without complications: Secondary | ICD-10-CM | POA: Insufficient documentation

## 2014-01-19 LAB — I-STAT TROPONIN, ED: Troponin i, poc: 0 ng/mL (ref 0.00–0.08)

## 2014-01-19 NOTE — ED Provider Notes (Signed)
CSN: [id number]     Arrival date & time 01/19/14  0146 History   First MD Initiated Contact with Patient 01/19/14 0202     Chief Complaint  Patient presents with  . Chest Pain      HPI Pt presents with chest pain, hx of CAD, hx of cocaine abuse and hx of coronary spasm in past. Presents with chest pain since approx 5pm. Seen in ER earlier for same. Labs, troponin and cxr normal at that time. Intoxicated at that time. In police custody. Decided to come back because chest pain persisted. Reports cocaine use 2 days ago. Intoxicated earlier today with ETOH of 290s.    Past Medical History  Diagnosis Date  . DM type 2 (diabetes mellitus, type 2)   . CAD (coronary artery disease)     a. Cath 2008- nonobstructive, mod LAD, mild LCx & RCA disease b. Cardiolite 2010 normal c. 03/2012 abnormal Lexiscan Myoview attributed to cocaine d. 03/2012 echo: EF 55-60%, moderate LVH, moderate LA dilatation  . HTN (hypertension)   . Hypercholesteremia   . Atrial fibrillation with RVR 04/19/12    New onset; spontaneous conversion to NSR; Pradaxa anticoagulation  . Cocaine abuse   . 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 ETOH abuse   . Tobacco abuse    Past Surgical History  Procedure Laterality Date  . Appendectomy    . Foot surgery     Family History  Problem Relation Age of Onset  . Heart attack Father 2465  . Diabetes Mother 2256   History  Substance Use Topics  . Smoking status: Current Every Day Smoker -- 3.00 packs/day for 40 years  . Smokeless tobacco: Never Used  . Alcohol Use: 6.0 oz/week    12 drink(s) per week     Comment: socially    Review of Systems  All other systems reviewed and are negative.     Allergies  Review of patient's allergies indicates no known allergies.  Home Medications   Prior to Admission medications   Medication Sig Start Date  End Date Taking? Authorizing Provider  nitroGLYCERIN (NITROSTAT) 0.4 MG SL tablet Place 0.4 mg under the tongue every 5 (five) minutes as needed for chest pain.     Historical Provider, MD   BP 174/105  Pulse 108  Temp(Src) 98.3 F (36.8 C) (Oral)  Resp 22  Ht 5\' 11"  (1.803 m)  Wt 210 lb (95.255 kg)  BMI 29.30 kg/m2  SpO2 99% Physical Exam  Nursing note and vitals reviewed. Constitutional: He is oriented to person, place, and time. He appears well-developed and well-nourished.  HENT:  Head: Normocephalic and atraumatic.  Eyes: EOM are normal.  Neck: Normal range of motion.  Cardiovascular: Normal rate, regular rhythm, normal heart sounds and intact distal pulses.   Pulmonary/Chest: Effort normal and breath sounds normal. No respiratory distress.  Abdominal: Soft. He exhibits no distension.  Musculoskeletal: Normal range of motion.  Neurological: He is alert and oriented to person, place, and time.  Skin: Skin is warm and dry.  Psychiatric: He has a normal mood and affect. Judgment normal.    ED Course  Procedures (including critical care time) Labs Review Labs Reviewed  Rosezena Sensor-STAT TROPOININ, ED    [facility]  [facility]   CLINICAL DATA:  Left-sided chest pain.  EXAM: PORTABLE CHEST - 1 VIEW  COMPARISON:  10/17/2012.  FINDINGS: Mediastinum and hilar structures are unremarkable. Stable cardiomegaly. Pulmonary vascularity normal. No focal infiltrate. No pleural effusion or pneumothorax. No acute bony abnormality .  IMPRESSION: Stable cardiomegaly.  No CHF.  No acute pulmonary disease.   Electronically Signed   By: Maisie Fus  Register   On: 01/18/2014 21:12  I personally reviewed the imaging tests through PACS system I reviewed available ER/hospitalization records through the EMR    EKG Interpretation   Date/Time:  Wednesday January 19 2014 02:01:31 EDT Ventricular Rate:  99 PR Interval:  194 QRS Duration: 90 QT Interval:  389 QTC Calculation: 499 R  Axis:   65 Text Interpretation:  Sinus tachycardia Atrial premature complexes  Abnormal R-wave progression, early transition Abnormal T, consider  ischemia, lateral leads No significant change was found Confirmed by  Ovidio Steele  MD, Davone Shinault ([id number]) on 01/19/2014 2:46:40 AM      MDM   Final diagnoses:  Chest pain, unspecified chest pain type    Medically clear. ecg normal. Troponin normal   Lyanne Co, MD 01/19/14 [phone]

## 2014-01-19 NOTE — ED Notes (Signed)
Pt seen here last night for chest pain as he was being transported in [employer] vehicle.  After being here for a period of time, pt signed out AMA and refused further treatement.  Pt statesthe pain has returned.

## 2014-01-19 NOTE — ED Notes (Signed)
Explained to pt. Risks of leaving. Pt. Continuing to refuse X-ray. Pt. Requesting to sign out AMA.

## 2014-05-05 ENCOUNTER — Encounter (HOSPITAL_COMMUNITY): Payer: Self-pay | Admitting: Cardiovascular Disease

## 2015-02-22 ENCOUNTER — Encounter (HOSPITAL_COMMUNITY): Payer: Self-pay | Admitting: Emergency Medicine

## 2015-02-22 ENCOUNTER — Inpatient Hospital Stay (HOSPITAL_COMMUNITY)
Admission: EM | Admit: 2015-02-22 | Discharge: 2015-02-26 | DRG: 189 | Disposition: A | Discharge status: Home / Self Care | Payer: Medicaid Other | Attending: Internal Medicine | Admitting: Internal Medicine

## 2015-02-22 ENCOUNTER — Ambulatory Visit (HOSPITAL_COMMUNITY): Payer: Medicaid Other

## 2015-02-22 ENCOUNTER — Emergency Department (HOSPITAL_COMMUNITY): Payer: Medicaid Other

## 2015-02-22 DIAGNOSIS — Z9119 Patient's noncompliance with other medical treatment and regimen: Secondary | ICD-10-CM | POA: Diagnosis not present

## 2015-02-22 DIAGNOSIS — I4891 Unspecified atrial fibrillation: Secondary | ICD-10-CM | POA: Diagnosis not present

## 2015-02-22 DIAGNOSIS — J9601 Acute respiratory failure with hypoxia: Secondary | ICD-10-CM | POA: Diagnosis present

## 2015-02-22 DIAGNOSIS — E1122 Type 2 diabetes mellitus with diabetic chronic kidney disease: Secondary | ICD-10-CM | POA: Diagnosis present

## 2015-02-22 DIAGNOSIS — F1910 Other psychoactive substance abuse, uncomplicated: Secondary | ICD-10-CM | POA: Diagnosis present

## 2015-02-22 DIAGNOSIS — I110 Hypertensive heart disease with heart failure: Secondary | ICD-10-CM | POA: Diagnosis present

## 2015-02-22 DIAGNOSIS — Z66 Do not resuscitate: Secondary | ICD-10-CM | POA: Diagnosis not present

## 2015-02-22 DIAGNOSIS — Z7982 Long term (current) use of aspirin: Secondary | ICD-10-CM | POA: Diagnosis not present

## 2015-02-22 DIAGNOSIS — N182 Chronic kidney disease, stage 2 (mild): Secondary | ICD-10-CM

## 2015-02-22 DIAGNOSIS — I2510 Atherosclerotic heart disease of native coronary artery without angina pectoris: Secondary | ICD-10-CM | POA: Diagnosis present

## 2015-02-22 DIAGNOSIS — F1410 Cocaine abuse, uncomplicated: Secondary | ICD-10-CM | POA: Diagnosis present

## 2015-02-22 DIAGNOSIS — E7800 Pure hypercholesterolemia, unspecified: Secondary | ICD-10-CM | POA: Diagnosis present

## 2015-02-22 DIAGNOSIS — R079 Chest pain, unspecified: Secondary | ICD-10-CM | POA: Diagnosis not present

## 2015-02-22 DIAGNOSIS — I10 Essential (primary) hypertension: Secondary | ICD-10-CM

## 2015-02-22 DIAGNOSIS — E119 Type 2 diabetes mellitus without complications: Secondary | ICD-10-CM | POA: Diagnosis present

## 2015-02-22 DIAGNOSIS — E1121 Type 2 diabetes mellitus with diabetic nephropathy: Secondary | ICD-10-CM | POA: Insufficient documentation

## 2015-02-22 DIAGNOSIS — B1710 Acute hepatitis C without hepatic coma: Secondary | ICD-10-CM | POA: Diagnosis present

## 2015-02-22 DIAGNOSIS — F1210 Cannabis abuse, uncomplicated: Secondary | ICD-10-CM | POA: Diagnosis present

## 2015-02-22 DIAGNOSIS — J449 Chronic obstructive pulmonary disease, unspecified: Secondary | ICD-10-CM | POA: Diagnosis present

## 2015-02-22 DIAGNOSIS — F1010 Alcohol abuse, uncomplicated: Secondary | ICD-10-CM | POA: Diagnosis present

## 2015-02-22 DIAGNOSIS — I248 Other forms of acute ischemic heart disease: Secondary | ICD-10-CM | POA: Diagnosis present

## 2015-02-22 DIAGNOSIS — R768 Other specified abnormal immunological findings in serum: Secondary | ICD-10-CM | POA: Diagnosis present

## 2015-02-22 DIAGNOSIS — B199 Unspecified viral hepatitis without hepatic coma: Secondary | ICD-10-CM | POA: Diagnosis present

## 2015-02-22 DIAGNOSIS — F17200 Nicotine dependence, unspecified, uncomplicated: Secondary | ICD-10-CM | POA: Diagnosis present

## 2015-02-22 DIAGNOSIS — Z720 Tobacco use: Secondary | ICD-10-CM | POA: Diagnosis present

## 2015-02-22 DIAGNOSIS — I5031 Acute diastolic (congestive) heart failure: Secondary | ICD-10-CM | POA: Diagnosis present

## 2015-02-22 DIAGNOSIS — R0600 Dyspnea, unspecified: Secondary | ICD-10-CM

## 2015-02-22 DIAGNOSIS — I5032 Chronic diastolic (congestive) heart failure: Secondary | ICD-10-CM | POA: Diagnosis present

## 2015-02-22 DIAGNOSIS — I509 Heart failure, unspecified: Secondary | ICD-10-CM | POA: Diagnosis not present

## 2015-02-22 DIAGNOSIS — I5033 Acute on chronic diastolic (congestive) heart failure: Secondary | ICD-10-CM | POA: Diagnosis present

## 2015-02-22 DIAGNOSIS — I480 Paroxysmal atrial fibrillation: Secondary | ICD-10-CM | POA: Diagnosis present

## 2015-02-22 DIAGNOSIS — J9602 Acute respiratory failure with hypercapnia: Secondary | ICD-10-CM | POA: Diagnosis present

## 2015-02-22 DIAGNOSIS — R7989 Other specified abnormal findings of blood chemistry: Secondary | ICD-10-CM | POA: Diagnosis not present

## 2015-02-22 DIAGNOSIS — R0603 Acute respiratory distress: Secondary | ICD-10-CM | POA: Diagnosis present

## 2015-02-22 DIAGNOSIS — R778 Other specified abnormalities of plasma proteins: Secondary | ICD-10-CM | POA: Diagnosis present

## 2015-02-22 DIAGNOSIS — J9690 Respiratory failure, unspecified, unspecified whether with hypoxia or hypercapnia: Secondary | ICD-10-CM | POA: Diagnosis present

## 2015-02-22 DIAGNOSIS — J441 Chronic obstructive pulmonary disease with (acute) exacerbation: Secondary | ICD-10-CM | POA: Diagnosis present

## 2015-02-22 DIAGNOSIS — I5021 Acute systolic (congestive) heart failure: Secondary | ICD-10-CM | POA: Diagnosis not present

## 2015-02-22 HISTORY — DX: Paroxysmal atrial fibrillation: I48.0

## 2015-02-22 HISTORY — DX: Acute respiratory distress: R06.03

## 2015-02-22 LAB — CBC WITH DIFFERENTIAL/PLATELET
BASOS PCT: 0 %
Basophils Absolute: 0 10*3/uL (ref 0.0–0.1)
Eosinophils Absolute: 0 10*3/uL (ref 0.0–0.7)
Eosinophils Relative: 0 %
HEMATOCRIT: 39.1 % (ref 39.0–52.0)
HEMOGLOBIN: 13.2 g/dL (ref 13.0–17.0)
Lymphocytes Relative: 26 %
Lymphs Abs: 2.1 10*3/uL (ref 0.7–4.0)
MCH: 34.6 pg — ABNORMAL HIGH (ref 26.0–34.0)
MCHC: 33.8 g/dL (ref 30.0–36.0)
MCV: 102.4 fL — ABNORMAL HIGH (ref 78.0–100.0)
MONOS PCT: 5 %
Monocytes Absolute: 0.4 10*3/uL (ref 0.1–1.0)
NEUTROS ABS: 5.5 10*3/uL (ref 1.7–7.7)
Neutrophils Relative %: 69 %
Platelets: 103 10*3/uL — ABNORMAL LOW (ref 150–400)
RBC: 3.82 MIL/uL — AB (ref 4.22–5.81)
RDW: 13.2 % (ref 11.5–15.5)
WBC: 8 10*3/uL (ref 4.0–10.5)

## 2015-02-22 LAB — RAPID URINE DRUG SCREEN, HOSP PERFORMED
Amphetamines: NOT DETECTED
Barbiturates: NOT DETECTED
Benzodiazepines: NOT DETECTED
Cocaine: POSITIVE — AB
OPIATES: NOT DETECTED
Tetrahydrocannabinol: POSITIVE — AB

## 2015-02-22 LAB — I-STAT ARTERIAL BLOOD GAS, ED
ACID-BASE DEFICIT: 1 mmol/L (ref 0.0–2.0)
BICARBONATE: 21.5 meq/L (ref 20.0–24.0)
O2 SAT: 93 %
PH ART: 7.465 — AB (ref 7.350–7.450)
PO2 ART: 63 mmHg — AB (ref 80.0–100.0)
Patient temperature: 98.6
TCO2: 22 mmol/L (ref 0–100)
pCO2 arterial: 29.9 mmHg — ABNORMAL LOW (ref 35.0–45.0)

## 2015-02-22 LAB — MAGNESIUM: Magnesium: 1.4 mg/dL — ABNORMAL LOW (ref 1.7–2.4)

## 2015-02-22 LAB — TROPONIN I
TROPONIN I: 0.34 ng/mL — AB (ref <0.031)
Troponin I: 0.39 ng/mL — ABNORMAL HIGH (ref <0.031)
Troponin I: 0.44 ng/mL — ABNORMAL HIGH (ref <0.031)

## 2015-02-22 LAB — BASIC METABOLIC PANEL
Anion gap: 6 (ref 5–15)
BUN: 7 mg/dL (ref 6–20)
CO2: 24 mmol/L (ref 22–32)
Calcium: 8.6 mg/dL — ABNORMAL LOW (ref 8.9–10.3)
Chloride: 110 mmol/L (ref 101–111)
Creatinine, Ser: 0.72 mg/dL (ref 0.61–1.24)
GFR calc Af Amer: >60 mL/min (ref >60)
GLUCOSE: 121 mg/dL — AB (ref 65–99)
Potassium: 4.2 mmol/L (ref 3.5–5.1)
SODIUM: 140 mmol/L (ref 135–145)

## 2015-02-22 LAB — HEPATIC FUNCTION PANEL
ALT: 99 U/L — AB (ref 17–63)
AST: 115 U/L — ABNORMAL HIGH (ref 15–41)
Albumin: 3.1 g/dL — ABNORMAL LOW (ref 3.5–5.0)
Alkaline Phosphatase: 62 U/L (ref 38–126)
BILIRUBIN DIRECT: 0.5 mg/dL (ref 0.1–0.5)
Indirect Bilirubin: 1.1 mg/dL — ABNORMAL HIGH (ref 0.3–0.9)
TOTAL PROTEIN: 6.8 g/dL (ref 6.5–8.1)
Total Bilirubin: 1.6 mg/dL — ABNORMAL HIGH (ref 0.3–1.2)

## 2015-02-22 LAB — MRSA PCR SCREENING: MRSA by PCR: NEGATIVE

## 2015-02-22 LAB — BRAIN NATRIURETIC PEPTIDE: B Natriuretic Peptide: 808.2 pg/mL — ABNORMAL HIGH (ref 0.0–100.0)

## 2015-02-22 LAB — HEPARIN LEVEL (UNFRACTIONATED): Heparin Unfractionated: 0.17 IU/mL — ABNORMAL LOW (ref 0.30–0.70)

## 2015-02-22 LAB — GLUCOSE, CAPILLARY: GLUCOSE-CAPILLARY: 126 mg/dL — AB (ref 65–99)

## 2015-02-22 MED ORDER — DEXTROSE 5 % IV SOLN
5.0000 mg/h | Freq: Once | INTRAVENOUS | Status: AC
  Administered 2015-02-22: 5 mg/h via INTRAVENOUS
  Filled 2015-02-22: qty 100

## 2015-02-22 MED ORDER — LORAZEPAM 2 MG/ML IJ SOLN: 2.0000 mg | INTRAMUSCULAR | Status: DC | PRN

## 2015-02-22 MED ORDER — PANTOPRAZOLE SODIUM 40 MG IV SOLR
40.0000 mg | Freq: Every day | INTRAVENOUS | Status: DC
  Administered 2015-02-22: 40 mg via INTRAVENOUS
  Filled 2015-02-22 (×2): qty 40

## 2015-02-22 MED ORDER — HEPARIN (PORCINE) IN NACL 100-0.45 UNIT/ML-% IJ SOLN
1700.0000 [IU]/h | INTRAMUSCULAR | Status: DC
  Administered 2015-02-22: 1450 [IU]/h via INTRAVENOUS
  Administered 2015-02-23: 1700 [IU]/h via INTRAVENOUS
  Filled 2015-02-22 (×4): qty 250

## 2015-02-22 MED ORDER — IPRATROPIUM BROMIDE 0.02 % IN SOLN
0.5000 mg | Freq: Four times a day (QID) | RESPIRATORY_TRACT | Status: DC
  Administered 2015-02-22 – 2015-02-23 (×2): 0.5 mg via RESPIRATORY_TRACT
  Filled 2015-02-22 (×3): qty 2.5

## 2015-02-22 MED ORDER — VITAMIN B-1 100 MG PO TABS
100.0000 mg | ORAL_TABLET | Freq: Every day | ORAL | Status: DC
  Administered 2015-02-22 – 2015-02-26 (×5): 100 mg via ORAL
  Filled 2015-02-22 (×6): qty 1

## 2015-02-22 MED ORDER — SODIUM CHLORIDE 0.9 % IV SOLN: 250.0000 mL | INTRAVENOUS | Status: DC | PRN

## 2015-02-22 MED ORDER — IPRATROPIUM BROMIDE 0.02 % IN SOLN: 0.5000 mg | Freq: Four times a day (QID) | RESPIRATORY_TRACT | Status: DC

## 2015-02-22 MED ORDER — SODIUM CHLORIDE 0.9 % IV SOLN
INTRAVENOUS | Status: DC
  Administered 2015-02-22: 16:00:00 via INTRAVENOUS

## 2015-02-22 MED ORDER — HEPARIN BOLUS VIA INFUSION
4500.0000 [IU] | Freq: Once | INTRAVENOUS | Status: AC
  Administered 2015-02-22: 4500 [IU] via INTRAVENOUS
  Filled 2015-02-22: qty 4500

## 2015-02-22 MED ORDER — DILTIAZEM HCL 100 MG IV SOLR
5.0000 mg/h | INTRAVENOUS | Status: DC
  Administered 2015-02-22: 10 mg/h via INTRAVENOUS
  Administered 2015-02-22: 15 mg/h via INTRAVENOUS
  Filled 2015-02-22 (×3): qty 100

## 2015-02-22 MED ORDER — CETYLPYRIDINIUM CHLORIDE 0.05 % MT LIQD
7.0000 mL | Freq: Two times a day (BID) | OROMUCOSAL | Status: DC
  Administered 2015-02-22 – 2015-02-26 (×7): 7 mL via OROMUCOSAL

## 2015-02-22 MED ORDER — METOPROLOL TARTRATE 1 MG/ML IV SOLN: 5.0000 mg | INTRAVENOUS | Status: DC

## 2015-02-22 MED ORDER — MAGNESIUM SULFATE 2 GM/50ML IV SOLN
2.0000 g | Freq: Once | INTRAVENOUS | Status: AC
  Administered 2015-02-22: 2 g via INTRAVENOUS
  Filled 2015-02-22: qty 50

## 2015-02-22 MED ORDER — INSULIN ASPART 100 UNIT/ML ~~LOC~~ SOLN
0.0000 [IU] | Freq: Three times a day (TID) | SUBCUTANEOUS | Status: DC
  Administered 2015-02-22 – 2015-02-25 (×4): 2 [IU] via SUBCUTANEOUS
  Administered 2015-02-25: 3 [IU] via SUBCUTANEOUS
  Administered 2015-02-25 – 2015-02-26 (×2): 2 [IU] via SUBCUTANEOUS

## 2015-02-22 MED ORDER — FOLIC ACID 1 MG PO TABS
1.0000 mg | ORAL_TABLET | Freq: Every day | ORAL | Status: DC
  Administered 2015-02-22 – 2015-02-26 (×5): 1 mg via ORAL
  Filled 2015-02-22 (×6): qty 1

## 2015-02-22 MED ORDER — DIGOXIN 0.25 MG/ML IJ SOLN
0.5000 mg | Freq: Once | INTRAMUSCULAR | Status: AC
  Administered 2015-02-22: 0.5 mg via INTRAVENOUS
  Filled 2015-02-22: qty 2

## 2015-02-22 MED ORDER — ONDANSETRON HCL 4 MG/2ML IJ SOLN: 4.0000 mg | Freq: Four times a day (QID) | INTRAMUSCULAR | Status: DC | PRN

## 2015-02-22 MED ORDER — METHYLPREDNISOLONE SODIUM SUCC 125 MG IJ SOLR
125.0000 mg | Freq: Once | INTRAMUSCULAR | Status: AC
  Administered 2015-02-22: 125 mg via INTRAVENOUS
  Filled 2015-02-22: qty 2

## 2015-02-22 MED ORDER — ADULT MULTIVITAMIN W/MINERALS CH
1.0000 | ORAL_TABLET | Freq: Every day | ORAL | Status: DC
  Administered 2015-02-22 – 2015-02-26 (×5): 1 via ORAL
  Filled 2015-02-22 (×6): qty 1

## 2015-02-22 MED ORDER — HEPARIN BOLUS VIA INFUSION
2000.0000 [IU] | Freq: Once | INTRAVENOUS | Status: AC
  Administered 2015-02-22: 2000 [IU] via INTRAVENOUS
  Filled 2015-02-22: qty 2000

## 2015-02-22 MED ORDER — LORAZEPAM 1 MG PO TABS
2.0000 mg | ORAL_TABLET | ORAL | Status: DC | PRN
  Administered 2015-02-22: 2 mg via ORAL
  Filled 2015-02-22: qty 2

## 2015-02-22 MED ORDER — FUROSEMIDE 10 MG/ML IJ SOLN
40.0000 mg | Freq: Two times a day (BID) | INTRAMUSCULAR | Status: AC
  Administered 2015-02-22 – 2015-02-24 (×4): 40 mg via INTRAVENOUS
  Filled 2015-02-22 (×4): qty 4

## 2015-02-22 MED ORDER — LEVALBUTEROL HCL 0.63 MG/3ML IN NEBU
0.6300 mg | INHALATION_SOLUTION | Freq: Four times a day (QID) | RESPIRATORY_TRACT | Status: DC
  Administered 2015-02-22 – 2015-02-23 (×2): 0.63 mg via RESPIRATORY_TRACT
  Filled 2015-02-22 (×7): qty 3

## 2015-02-22 MED ORDER — LEVALBUTEROL HCL 0.63 MG/3ML IN NEBU
0.6300 mg | INHALATION_SOLUTION | Freq: Four times a day (QID) | RESPIRATORY_TRACT | Status: DC
  Filled 2015-02-22 (×4): qty 3

## 2015-02-22 MED ORDER — FUROSEMIDE 10 MG/ML IJ SOLN
40.0000 mg | Freq: Once | INTRAMUSCULAR | Status: AC
  Administered 2015-02-22: 40 mg via INTRAVENOUS
  Filled 2015-02-22: qty 4

## 2015-02-22 MED ORDER — ASPIRIN 81 MG PO CHEW
81.0000 mg | CHEWABLE_TABLET | Freq: Every day | ORAL | Status: DC
  Administered 2015-02-22 – 2015-02-23 (×2): 81 mg via ORAL
  Filled 2015-02-22 (×2): qty 1

## 2015-02-22 MED ORDER — DIGOXIN 125 MCG PO TABS
0.1250 mg | ORAL_TABLET | Freq: Every day | ORAL | Status: DC
  Administered 2015-02-23 – 2015-02-24 (×2): 0.125 mg via ORAL
  Filled 2015-02-22 (×3): qty 1

## 2015-02-22 NOTE — ED Notes (Signed)
Pt received 324 asa 2 nitro and 30mg  of Cardizem by ems

## 2015-02-22 NOTE — ED Provider Notes (Signed)
CSN: [id number]     Arrival date & time 02/22/15  [id number] History   First MD Initiated Contact with Patient 02/22/15 [phone]     Chief Complaint  Patient presents with  . Atrial Fibrillation     (Consider location/radiation/quality/duration/timing/severity/associated sxs/prior Treatment) Patient is a 59 y.o. male presenting with atrial fibrillation. The history is provided by the patient.  Atrial Fibrillation This is a recurrent problem. Associated symptoms include chest pain and shortness of breath. Pertinent negatives include no abdominal pain.   patient presents with shortness of breath. History of feeling bad for last couple of days. Found by EMS to be in atrial fibrillation with RVR. Has some dull chest tightness to peers had a cough with minimal production. Has previous history of A. fib. Was previously on anticoagulation but is not currently. No swelling in his legs. No fevers. History of cocaine abuse but denies it now.  Past Medical History  Diagnosis Date  . DM type 2 (diabetes mellitus, type 2) (HCC)   . CAD (coronary artery disease)     a. Cath 2008- nonobstructive, mod LAD, mild LCx & RCA disease b. Cardiolite 2010 normal c. 03/2012 abnormal Lexiscan Myoview attributed to cocaine d. 03/2012 echo: EF 55-60%, moderate LVH, moderate LA dilatation  . HTN (hypertension)   . Hypercholesteremia   . Atrial fibrillation with RVR (HCC) 04/19/12    New onset; spontaneous conversion to NSR; Pradaxa anticoagulation  . Cocaine abuse   . 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 ETOH abuse   . Tobacco abuse   . Atrial fibrillation with rapid ventricular response (HCC) 02/22/2015  . Acute respiratory distress (HCC) 02/22/2015   Past Surgical History  Procedure Laterality Date  . Appendectomy    . Foot surgery     Family History  Problem Relation Age of Onset  . Heart  attack Father 3765  . Diabetes Mother 6856   Social History  Substance Use Topics  . Smoking status: Current Every Day Smoker -- 3.00 packs/day for 40 years  . Smokeless tobacco: Never Used  . Alcohol Use: 6.0 oz/week    12 drink(s) per week     Comment: socially    Review of Systems  Constitutional: Positive for fatigue. Negative for appetite change.  HENT: Negative for trouble swallowing.   Respiratory: Positive for cough and shortness of breath.   Cardiovascular: Positive for chest pain.  Gastrointestinal: Negative for abdominal pain.  Genitourinary: Negative for flank pain.  Musculoskeletal: Negative for back pain.  Skin: Negative for color change.  Neurological: Negative for syncope.      Allergies  Review of patient's allergies indicates no known allergies.  Home Medications   Prior to Admission medications   Medication Sig Start Date End Date Taking? Authorizing Provider  aspirin 81 MG tablet Take 81 mg by mouth daily.   Yes Historical Provider, MD  clonazePAM (KLONOPIN) 0.5 MG tablet Take 0.5 mg by mouth 3 (three) times daily.   Yes Historical Provider, MD  diclofenac sodium (VOLTAREN) 1 % GEL Apply 2 g topically 4 (four) times daily.   Yes Historical Provider, MD  HYDROcodone-acetaminophen (NORCO/VICODIN) 5-325 MG tablet Take 1 tablet by mouth every 6 (six) hours as needed for moderate pain.   Yes Historical Provider, MD  metoprolol succinate (TOPROL-XL) 50 MG 24 hr tablet Take 50 mg by mouth daily. Take with or immediately  following a meal.   Yes Historical Provider, MD  nitroGLYCERIN (NITROSTAT) 0.4 MG SL tablet Place under the tongue every 5 (five) minutes as needed for chest pain.    Yes Historical Provider, MD   BP 157/86 mmHg  Pulse 121  Temp(Src) 98.5 F (36.9 C) (Oral)  Resp 31  Ht  (1.803 m)  Wt 220 lb (99.791 kg)  BMI 30.70 kg/m2  SpO2 92% Physical Exam  Constitutional: He appears well-developed.  HENT:  Head: Atraumatic.  Neck: Neck supple.   Cardiovascular:  Irregular tachycardia  Pulmonary/Chest:  KB Michelle respirations. Some scattered wheezes but also some rales.  Abdominal: Soft. There is no tenderness.  Musculoskeletal: He exhibits no edema.  Neurological: He is alert.  Skin: Skin is warm.    ED Course  Procedures (including critical care time) Labs Review Labs Reviewed  BASIC METABOLIC PANEL - Abnormal; Notable for the following:    Glucose, Bld 121 (*)    Calcium 8.6 (*)    All other components within normal limits  URINE RAPID DRUG SCREEN, HOSP PERFORMED - Abnormal; Notable for the following:    Cocaine POSITIVE (*)    Tetrahydrocannabinol POSITIVE (*)    All other components within normal limits  CBC WITH DIFFERENTIAL/PLATELET - Abnormal; Notable for the following:    RBC 3.82 (*)    MCV 102.4 (*)    MCH 34.6 (*)    Platelets 103 (*)    All other components within normal limits  MAGNESIUM - Abnormal; Notable for the following:    Magnesium 1.4 (*)    All other components within normal limits  TROPONIN I - Abnormal; Notable for the following:    Troponin I 0.39 (*)    All other components within normal limits  HEPATIC FUNCTION PANEL - Abnormal; Notable for the following:    Albumin 3.1 (*)    AST 115 (*)    ALT 99 (*)    Total Bilirubin 1.6 (*)    Indirect Bilirubin 1.1 (*)    All other components within normal limits  BRAIN NATRIURETIC PEPTIDE  BLOOD GAS, ARTERIAL  I-STAT ARTERIAL BLOOD GAS, ED    Imaging Review Dg Chest Portable 1 View  02/22/2015  CLINICAL DATA:  Shortness of breath and cough for 1 day; chest pain EXAM: PORTABLE CHEST 1 VIEW COMPARISON:  January 18, 2014 FINDINGS: There is interstitial edema, more on the left than on the right. Mild patchy alveolar edema is noted in the left mid and lower lung zones. There is atelectasis in the medial right base. There is cardiomegaly with pulmonary venous hypertension. No adenopathy appreciable. IMPRESSION: Evidence of a degree of  congestive heart failure with more edema on the left than on the right. Medial right base atelectasis. Electronically Signed   By: Bretta Bang III M.D.   On: 02/22/2015 10:10   I have personally reviewed and evaluated these images and lab results as part of my medical decision-making.   EKG Interpretation   Date/Time:  Wednesday February 22 2015 09:22:02 EDT Ventricular Rate:  127 PR Interval:  118 QRS Duration: 86 QT Interval:  323 QTC Calculation: 469 R Axis:   70 Text Interpretation:  Atrial fibrillation with rapid ventricular response  Left ventricular hypertrophy Repol abnrm suggests ischemia, lateral leads  Confirmed by Patryck Kilgore  MD, Hannalee Castor [phone]) on 02/22/2015 9:33:21 AM      MDM   Final diagnoses:  Atrial fibrillation with rapid ventricular response (HCC)  Acute respiratory distress (HCC)  Acute  congestive heart failure, unspecified congestive heart failure type (HCC)  Cocaine abuse    Patient did atrial fibrillation with RVR. Could be a component of COPD also. Initial Cardizem given by EMS with nitroglycerin. Became hypotensive. Cardizem drip started here. Continue to have difficulty with rate control. Unknown onset of the A. fib. Not chronic anticoagulation. Would get hypoxic on room air with any effort. Seen by cardiology and a request critical care admission. Will give trial of BiPAP. Will also give steroids  CRITICAL CARE Performed by: Billee Cashing Total critical care time: 30 minutes Critical care time was exclusive of separately billable procedures and treating other patients. Critical care was necessary to treat or prevent imminent or life-threatening deterioration. Critical care was time spent personally by me on the following activities: development of treatment plan with patient and/or surrogate as well as nursing, discussions with consultants, evaluation of patient's response to treatment, examination of patient, obtaining history from patient or  surrogate, ordering and performing treatments and interventions, ordering and review of laboratory studies, ordering and review of radiographic studies, pulse oximetry and re-evaluation of patient's condition.    Benjiman Core, MD 02/22/15 [phone]

## 2015-02-22 NOTE — Progress Notes (Signed)
    Trouble controlling HR with dilt IV at 15mg /hr.  Will add digoxin .5 IV load with 0.125mg  PO QD Air movement has improved since ER.   As lung status improved, hopefully HR will improve as well.   Amiodarone would be next step.  Avoiding beta blocker with cocaine use.  Donato SchultzSKAINS, MARK, MD

## 2015-02-22 NOTE — ED Notes (Signed)
Pt here from home with c/o chest and sob along with afib which he has a history of

## 2015-02-22 NOTE — Progress Notes (Signed)
ANTICOAGULATION CONSULT NOTE - Initial Consult  Pharmacy Consult for Heparin Indication: atrial fibrillation  No Known Allergies  Patient Measurements: Height: 5\' 11"  (180.3 cm) Weight: 220 lb (99.791 kg) IBW/kg (Calculated) : 75.3 Heparin Dosing Weight: 96 kg  Vital Signs: Temp: 98.5 F (36.9 C) (11/02 0921) Temp Source: Oral (11/02 0921) BP: 147/89 mmHg (11/02 1015) Pulse Rate: 105 (11/02 1000)  Labs:  Recent Labs  02/22/15 0959  HGB 13.2  HCT 39.1  PLT 103*  CREATININE 0.72  TROPONINI 0.39*     Assessment: 8yoM admitted with CP and Afib. On no anti-coagulation PTA.  PMHx: cocaine/alcohol induced afib, coronary vasospasm, HLD, HLD, transaminitis secondary to alcohol and HCV.  Goal of Therapy:  Heparin level 0.3-0.7 units/ml Monitor platelets by anticoagulation protocol: Yes   Plan:  1. Give 4500 units bolus x 1 2. Start heparin infusion at 1450 units/hr 3. Continue to monitor H&H and platelets  4. HL in 6 hours followed by daily HL  Pollyann SamplesAndy Luceal Hollibaugh, PharmD, BCPS 02/22/2015, 10:59 AM Pager: [phone]

## 2015-02-22 NOTE — Progress Notes (Signed)
Pt refused foley catheter, voiding in urinal without difficulty.

## 2015-02-22 NOTE — H&P (Signed)
History and Physical   Patient ID: [name] MRN: [id number], DOB/AGE: Aug 27, 1955 59 y.o. Date of Encounter: 02/22/2015  Primary Physician: Tomi Bamberger, NP Primary Cardiologist: Dr Elease Hashimoto  Chief Complaint:  Chest pain, rapid afib and SOB  HPI: Kevin Orozco is a 59 y.o. male with a history of non-obs CAD at cath '08, HTN, HL, PAF no longer on Pradaxa, cocaine use, tob use, ETOH abuse, HCV.   This past weekend he drank a fifth of liquor and 2 - 12-packs beer. Also did THC and continues to smoke. Denies cocaine use (but UDS +). Pt woke yesterday with chest pain at 8/10. He was SOB. Took NSAIDS with no help. SL NTG totaling 3 helped a little. The pain is in the center of his chest and also between shoulder blades. Worse with cough. No fever, but cough productive of yellow sputum.   This am, was feeling so bad he called 911. EMS gave him ASA x 4, SL NTG and IV Cardizem 30 mg. Meds helped some, but still with 6/10 pain and severe SOB. No palpitations, not aware of rapid or irregular HR. Seems very SOB and really struggles to breathe, even though sats 90% at rest on room air.  Uses no inhalers at home. Occasionally hears himself wheeze. Coughs daily. Could afford Pradaxa on Medicaid but quit taking it because he was bleeding too much from minor cuts (works with tools at home).  Past Medical History  Diagnosis Date  . DM type 2 (diabetes mellitus, type 2) (HCC)   . CAD (coronary artery disease)     a. Cath 2008- nonobstructive, mod LAD, mild LCx & RCA disease b. Cardiolite 2010 normal c. 03/2012 abnormal Lexiscan Myoview attributed to cocaine d. 03/2012 echo: EF 55-60%, moderate LVH, moderate LA dilatation  . HTN (hypertension)   . Hypercholesteremia   . Atrial fibrillation with RVR (HCC) 04/19/12    New onset; spontaneous conversion to NSR; Pradaxa anticoagulation  . Cocaine abuse   . HCV antibody positive 03/2012    Elevated LFTs in the setting of acute EtOH abuse, no acute  findings on abdominal u/s, HCV+, suspected acute alcohilic heptatitis  . ETOH abuse   . Tobacco abuse   . Atrial fibrillation with rapid ventricular response (HCC) 02/22/2015  . Acute respiratory distress (HCC) 02/22/2015    Surgical History:  Past Surgical History  Procedure Laterality Date  . Appendectomy    . Foot surgery       I have reviewed the patient's current medications. Prior to Admission medications   Medication Sig Start Date End Date Taking? Authorizing Provider  aspirin 81 MG tablet Take 81 mg by mouth daily.   Yes Historical Provider, MD  clonazePAM (KLONOPIN) 0.5 MG tablet Take 0.5 mg by mouth 3 (three) times daily.   Yes Historical Provider, MD  diclofenac sodium (VOLTAREN) 1 % GEL Apply 2 g topically 4 (four) times daily.   Yes Historical Provider, MD  HYDROcodone-acetaminophen (NORCO/VICODIN) 5-325 MG tablet Take 1 tablet by mouth every 6 (six) hours as needed for moderate pain.   Yes Historical Provider, MD  metoprolol succinate (TOPROL-XL) 50 MG 24 hr tablet Take 50 mg by mouth daily. Take with or immediately following a meal.   Yes Historical Provider, MD  nitroGLYCERIN (NITROSTAT) 0.4 MG SL tablet Place under the tongue every 5 (five) minutes as needed for chest pain.    Yes Historical Provider, MD   Scheduled Meds:   Continuous Infusions: . heparin 1,450 Units/hr (  02/22/15 1219)    Allergies: No Known Allergies  Social History   Social History  . Marital Status: Divorced    Spouse Name: N/A  . Number of Children: N/A  . Years of Education: N/A   Occupational History  . unemployed    Social History Main Topics  . Smoking status: Current Every Day Smoker -- 3.00 packs/day for 40 years  . Smokeless tobacco: Never Used  . Alcohol Use: 6.0 oz/week    12 drink(s) per week     Comment: socially  . Drug Use: Yes    Special: Cocaine  . Sexual Activity: Not Currently   Other Topics Concern  . Not on file   Social History Narrative   Unemployed.   Lives alone. Divorced.    Family History  Problem Relation Age of Onset  . Heart attack Father 12  . Diabetes Mother 36   Family Status  Relation Status Death Age  . Father Deceased 11    died of CAD/MI  . Mother Deceased 29    died of comps of Diabetes  . Sister Alive   . Brother Deceased 25    died of comps of diabetes  . Sister Deceased 17    died of comps of diabetes    Review of Systems:   Full 14-point review of systems otherwise negative except as noted above.  Physical Exam: Blood pressure 157/86, pulse 121, temperature 98.5 F (36.9 C), temperature source Oral, resp. rate 31, height  (1.803 m), weight 220 lb (99.791 kg), SpO2 92 %. General: Well developed, well nourished,male in respiratory distress. Head: Normocephalic, atraumatic, sclera non-icteric, no xanthomas, nares are without discharge. Dentition: poor Neck: No carotid bruits. JVD elevated at 9 cm. No thyromegally. Not able to check hepatojugular reflux due to abd pain Lungs: Good expansion bilaterally. With exp wheezes no rhonchi. +rales Heart: IRRegular rate and rhythm with S1 S2.  No S3 or S4.  + murmur but hard to hear with rapid rate, no rubs, or gallops appreciated. Abdomen: Soft, tender R>L, distended with + bowel sounds. + hepatomegaly. No rebound, +guarding. No obvious abdominal masses. Msk:  Strength and tone appear normal for age. No joint deformities or effusions, no spine or costo-vertebral angle tenderness. Extremities: + clubbing, no cyanosis. No edema.  Distal pedal pulses are 2+ in 4 extrem Neuro: Alert and oriented X 3. Moves all extremities spontaneously. No focal deficits noted. Skin: No rashes or lesions noted  Labs:   ABG    Component Value Date/Time   PHART 7.465* 02/22/2015 1213   PCO2ART 29.9* 02/22/2015 1213   PO2ART 63.0* 02/22/2015 1213   HCO3 21.5 02/22/2015 1213   TCO2 22 02/22/2015 1213   ACIDBASEDEF 1.0 02/22/2015 1213   O2SAT 93.0 02/22/2015 1213   Lab Results    Component Value Date   WBC 8.0 02/22/2015   HGB 13.2 02/22/2015   HCT 39.1 02/22/2015   MCV 102.4* 02/22/2015   PLT 103* 02/22/2015    Recent Labs Lab 02/22/15 0959  NA 140  K 4.2  CL 110  CO2 24  BUN 7  CREATININE 0.72  CALCIUM 8.6*  PROT 6.8  BILITOT 1.6*  ALKPHOS 62  ALT 99*  AST 115*  GLUCOSE 121*    Recent Labs  02/22/15 0959  TROPONINI 0.39*   No results found for: BNP  Drugs of Abuse     Component Value Date/Time   LABOPIA NONE DETECTED 02/22/2015 1039   COCAINSCRNUR POSITIVE* 02/22/2015 1039  LABBENZ NONE DETECTED 02/22/2015 1039   AMPHETMU NONE DETECTED 02/22/2015 1039   THCU POSITIVE* 02/22/2015 1039   LABBARB NONE DETECTED 02/22/2015 1039    Radiology/Studies: Dg Chest Portable 1 View 02/22/2015  CLINICAL DATA:  Shortness of breath and cough for 1 day; chest pain EXAM: PORTABLE CHEST 1 VIEW COMPARISON:  January 18, 2014 FINDINGS: There is interstitial edema, more on the left than on the right. Mild patchy alveolar edema is noted in the left mid and lower lung zones. There is atelectasis in the medial right base. There is cardiomegaly with pulmonary venous hypertension. No adenopathy appreciable. IMPRESSION: Evidence of a degree of congestive heart failure with more edema on the left than on the right. Medial right base atelectasis. Electronically Signed   By: Bretta BangWilliam  Woodruff III M.D.   On: 02/22/2015 10:10   Cardiac Cath: 01/19/2007 ASSESSMENT: 1. Moderate nonobstructive coronary artery disease with a 40% to 50%  calcific calcified ostial LAD lesion, as described above. 2. Normal left ventricular function. 3. Mildly-dilated aortic root.  Echo & MV: 04/21/2012 Echo showed normal LV function - EF of 65%.  Myoview showed an unusual pattern of mid septal attenuation. This could be explained by coronary spasm (possibly due to cocaine).   ECG: 02/22/2015 Atrial fib, RVR HR 127 Mild ST changes lateral leads - different from  12/2013  ASSESSMENT AND PLAN:  Principal Problem:   Acute respiratory distress (HCC) - CCM being consulted by ER MD.  - he is being given steroids, nebs - we will diurese also - based on ABG results, COPD is not that bad - may turn around quickly, but for now plan ICU admission by CCM/IM  Active Problems:   Atrial fibrillation with rapid ventricular response (HCC) CHADSVASC = 2 - elevated rate at least partly due to resp issues - continue Cardizem for rate control - he is not aware of rapid or irreg rhythm - duration unknown - even if we get him back in SR, recurrence is likely - plan rate control and anticoagulation - This patients CHA2DS2-VASc Score and unadjusted Ischemic Stroke Rate (% per year) is equal to 2.2 % stroke rate/year from a score of 2 Above score calculated as 1 point each if present [CHF, HTN, DM, Vascular=MI/PAD/Aortic Plaque, Age if 65-74, or Male], 2 points each if present [Age > 75, or Stroke/TIA/TE] - heparin for now, consider resuming Pradaxa once need for invasive workup is over.    Acute CHF (congestive heart failure) (HCC) - Lasix 40 mg IV x 1 now, then start 40 mg IV BID - follow K+, renal function, weights - ck Echo    Elevated troponin - both CP and elevated troponin may be due to afib - ck echo, add heparin, continue ASA - no statin w/ abnl LFTs - decide on cath once pt more medically stable and ez trend established     Hypertension -  Improving with rx       Alcohol abuse   Cocaine abuse - cessation advised   Melida QuitterSigned, Kevin Orozco, Rhonda, PA-C 02/22/2015 12:21 PM Beeper [phone]    Personally seen and examined. Agree with above.  59 year old male smoker, cocaine use, here with increasing shortness of breath, minimally elevated troponin, history of heavy alcohol use with elevated AST ALT, chest x-ray with interstitial edema and physical exam with poor air movement bilaterally, hypoxia, mild hypercapnia consistent with a mixed picture of  acute diastolic heart failure as well as acute respiratory failure, bronchoconstriction.  Acute diastolic heart failure  -  Checking echocardiogram  - IV Lasix administered. 40 mg twice a day  - BiPAP if necessary per critical care team  - Rate control atrial fibrillation  Atrial fibrillation with rapid ventricular response  - IV Cardizem drip  - Hopefully as fluid status improves and respiratory status improves, his atrial fibrillation will improve as well.  - Maintaining blood pressure well. Would not suggest urgent cardioversion as he would likely revert back to atrial fibrillation.  - Noncompliant with anticoagulation in the past. Previously on Pradaxa. - We will utilize heparin for now and transition to Plavix once again after we make sure he does not need an invasive workup.  Elevated troponin  - Minimally elevated, could be consistent with acute heart failure/respiratory failure. We will continue to trend. If it increases significantly this would be more consistent with non-ST elevation myocardial infarction in the setting of cocaine use.  - Prior cardiac catheterization in 2008 nonobstructive moderate LAD disease, mild circumflex and RCA disease. Prior stress test in 2013 abnormal, attributed to cocaine use. Previous echocardiogram showed ejection fraction of 55-60% with moderate LVH and moderate left atrial dilatation.  Tobacco use/cocaine use/alcohol  - Notices wheezing at home occasionally, smoker's cough.  - Cessation.  - CIWA  Hepatitis C positive/ alcoholic liver enzymes  Acute respiratory failure with hypoxia and hypercapnia  - Appreciate critical care medicine  - Nebulizer/steroids  Donato Schultz, MD

## 2015-02-22 NOTE — ED Notes (Signed)
Report called  

## 2015-02-22 NOTE — ED Notes (Signed)
Pt becomes sob with movement in bed

## 2015-02-22 NOTE — Consult Note (Signed)
Name: Kevin Orozco MRN: [id number] DOB: Dec 06, 1955    ADMISSION DATE:  02/22/2015 CONSULTATION DATE:  11/2  REFERRING MD :  Anne Fu   CHIEF COMPLAINT:  Respiratory failure, HTN   BRIEF PATIENT DESCRIPTION:  59yo male smoker with hx DM, CAD, HTN, AFib, polysubstance abuse presented 11/2 with SOB, chest pain, cough.  Found to be in AFib with RVR, UDS + for cocaine.  In ER required bipap briefly and PCCM asked to admit to ICU.    SIGNIFICANT EVENTS    STUDIES:  2D echo 11/2>>>   HISTORY OF PRESENT ILLNESS: 59yo male smoker with hx DM, CAD, HTN, AFib, polysubstance abuse presented 11/2 with SOB, chest pain, cough.  Found to be in AFib with RVR, UDS + for cocaine.  In ER required bipap briefly and PCCM asked to admit to ICU.   Denies cocaine abuse (but UDS POS).  Coughs regularly, currently has intermittent sputum production (yellow).  Still c/o dyspnea, chest pain although both somewhat improved.  Had asthma as a child.  Has never used daily inhalers. Denies syncope, lightheadedness, hemoptysis, diaphoresis.    PAST MEDICAL HISTORY :   has a past medical history of DM type 2 (diabetes mellitus, type 2) (HCC); CAD (coronary artery disease); HTN (hypertension); Hypercholesteremia; Atrial fibrillation with RVR (HCC) (04/19/12); Cocaine abuse; HCV antibody positive (03/2012); ETOH abuse; Tobacco abuse; Atrial fibrillation with rapid ventricular response (HCC) (02/22/2015); and Acute respiratory distress (HCC) (02/22/2015).  has past surgical history that includes Appendectomy and Foot surgery. Prior to Admission medications   Medication Sig Start Date End Date Taking? Authorizing Provider  aspirin 81 MG tablet Take 81 mg by mouth daily.   Yes Historical Provider, MD  clonazePAM (KLONOPIN) 0.5 MG tablet Take 0.5 mg by mouth 3 (three) times daily.   Yes Historical Provider, MD  diclofenac sodium (VOLTAREN) 1 % GEL Apply 2 g topically 4 (four) times daily.   Yes Historical Provider, MD    HYDROcodone-acetaminophen (NORCO/VICODIN) 5-325 MG tablet Take 1 tablet by mouth every 6 (six) hours as needed for moderate pain.   Yes Historical Provider, MD  metoprolol succinate (TOPROL-XL) 50 MG 24 hr tablet Take 50 mg by mouth daily. Take with or immediately following a meal.   Yes Historical Provider, MD  nitroGLYCERIN (NITROSTAT) 0.4 MG SL tablet Place under the tongue every 5 (five) minutes as needed for chest pain.    Yes Historical Provider, MD   No Known Allergies  FAMILY HISTORY:  family history includes Diabetes (age of onset: 55) in his mother; Heart attack (age of onset: 52) in his father. SOCIAL HISTORY:  reports that he has been smoking.  He has never used smokeless tobacco. He reports that he drinks about 6.0 oz of alcohol per week. He reports that he uses illicit drugs (Cocaine).  REVIEW OF SYSTEMS:   As per HPI - All other systems reviewed and were neg.    SUBJECTIVE:  Feels ok when still, but dyspnea with any movement or exertion  VITAL SIGNS: Temp:  [98.5 F (36.9 C)] 98.5 F (36.9 C) (11/02 0921) Pulse Rate:  [88-155] 127 (11/02 1222) Resp:  [21-41] 28 (11/02 1222) BP: (114-159)/(79-137) 146/98 mmHg (11/02 1222) SpO2:  [92 %-98 %] 93 % (11/02 1222) Weight:  [220 lb (99.791 kg)] 220 lb (99.791 kg) (11/02 0921)  PHYSICAL EXAMINATION:  General:  Chronically ill appearing male, appears older than stated age, NAD  Neuro:  Awake, alert, appropriate, MAE  HEENT:  Mm moist, no JVD Cardiovascular:  s1s2 irreg, AFib 130's  Lungs:  resps even, mildly labored on [place], diminished, bibasilar crackles, exp wheeze  Abdomen:  Round, soft, NT Musculoskeletal:  Warm and dry, scant BLE edema    Recent Labs Lab 02/22/15 0959  NA 140  K 4.2  CL 110  CO2 24  BUN 7  CREATININE 0.72  GLUCOSE 121*    Recent Labs Lab 02/22/15 0959  HGB 13.2  HCT 39.1  WBC 8.0  PLT 103*   Dg Chest Portable 1 View  02/22/2015  CLINICAL DATA:  Shortness of breath and cough for 1  day; chest pain EXAM: PORTABLE CHEST 1 VIEW COMPARISON:  January 18, 2014 FINDINGS: There is interstitial edema, more on the left than on the right. Mild patchy alveolar edema is noted in the left mid and lower lung zones. There is atelectasis in the medial right base. There is cardiomegaly with pulmonary venous hypertension. No adenopathy appreciable. IMPRESSION: Evidence of a degree of congestive heart failure with more edema on the left than on the right. Medial right base atelectasis. Electronically Signed   By: Bretta BangWilliam  Woodruff III M.D.   On: 02/22/2015 10:10    ASSESSMENT / PLAN:  Acute respiratory failure - r/t pulmonary edema in setting AFib with RVR, cocaine abuse, HTN. Tobacco abuse  Probable COPD   PLAN -  bipap PRN  Diuresis as below  BD's  F/u CXR  Will need outpt pulm f/u with PFT's, probably scheduled BD's Hold further systemic steroids for now in absence of wheeze or bronchospasm Tobacco cessation counseling   Acute systolic and diastolic CHF  Pulmonary edema  HTN with probable chronic diastolic dysfxn  Atrial fibrillation with RVR Hx CAD  Mildly elevated troponin  PLAN -  Cards following  Echo pending  Trend troponin  cardizem gtt per cards  Aggressive diuresis as BP and Scr tol  No statin with abnormal LFT's  Heparin gtt  Hold home metoprolol   Hx ETOH  Abnormal LFT's  Polysubstance abuse  PLAN -  CIWA protocol  Thiamine, folate  Heart healthy diet  F/u LFT's  Cessation counseling    DM2  PLAN -  SSI  HgbA1c    Dirk DressKaty Whiteheart, NP 02/22/2015  1:24 PM Pager: [phone] or [phone]) [phone]  Attending Note:  I have examined patient, reviewed labs, studies and notes. I have discussed the case with Jasper RilingK Whiteheart, and I agree with the data and plans as amended above. Patient has CAD, HTN, A fib, EtOH and cocaine abuse. Presents with acute hypoxemic respiratory failure, bilateral pulmonary infiltrates and A fib + RVR. Cocaine positive. Briefly  tried BiPAP in the ED, but on my eval it had been removed due to inability to tolerate. He had stable respiratory status on face mask O2, no wheeze on exam but crackles midway up posteriorly better heard on the L. Tachycardic to the 130-140's on diltiazem gtt. Will admit to ICU for further support and management, titration of diltiazem. I suspect that cocaine was the precipitating factor and am hopeful that hemodynamics will improve as it clears. Will diurese as per cardiology recommendations. Initiate CIWA scoring given his high risk to withdraw from EtOH.  Independent critical care time is 45 minutes.   Levy Pupaobert Leonard Feigel, MD, PhD 02/22/2015, 2:19 PM [facility] [phone] or if no answer [phone]

## 2015-02-22 NOTE — Progress Notes (Signed)
Patient placed on BIPAP. Patient did not tolerate well. Patient requested to remove BIPAP mask after a total of 10 minutes. Patient placed back on 4lnc. Patient stable at this time. RN aware. RT will continue to monitor.

## 2015-02-22 NOTE — Progress Notes (Signed)
ANTICOAGULATION CONSULT NOTE - Initial Consult  Pharmacy Consult for Heparin Indication: atrial fibrillation  No Known Allergies  Patient Measurements: Height: 5\' 11"  (180.3 cm) Weight: 220 lb (99.791 kg) IBW/kg (Calculated) : 75.3 Heparin Dosing Weight: 96 kg  Vital Signs: Temp: 98.2 F (36.8 C) (11/02 2004) Temp Source: Oral (11/02 2004) BP: 114/98 mmHg (11/02 2000) Pulse Rate: 116 (11/02 2000)  Labs:  Recent Labs  02/22/15 0959 02/22/15 1328 02/22/15 1858  HGB 13.2  --   --   HCT 39.1  --   --   PLT 103*  --   --   HEPARINUNFRC  --   --  0.17*  CREATININE 0.72  --   --   TROPONINI 0.39* 0.44*  --      Assessment: 8yoM admitted with CP and Afib, on IV heparin. First heparin level 0.17, subtherapeutic on 1450 units/hr. No bleeding noted per chart.    Goal of Therapy:  Heparin level 0.3-0.7 units/ml Monitor platelets by anticoagulation protocol: Yes   Plan:  - Rebolus 2000 units  - Increase heparin rate to 1700 units/hr - F/u AM heparin level  Bayard HuggerMei Rashon Westrup, PharmD, BCPS  Clinical Pharmacist  Pager: [phone]

## 2015-02-22 NOTE — Progress Notes (Signed)
Echocardiogram 2D Echocardiogram has been performed.  Kevin Orozco, Kevin Orozco 02/22/2015, 4:01 PM

## 2015-02-23 ENCOUNTER — Inpatient Hospital Stay (HOSPITAL_COMMUNITY): Payer: Medicaid Other

## 2015-02-23 DIAGNOSIS — I248 Other forms of acute ischemic heart disease: Secondary | ICD-10-CM

## 2015-02-23 DIAGNOSIS — I509 Heart failure, unspecified: Secondary | ICD-10-CM | POA: Diagnosis present

## 2015-02-23 DIAGNOSIS — J9600 Acute respiratory failure, unspecified whether with hypoxia or hypercapnia: Secondary | ICD-10-CM

## 2015-02-23 DIAGNOSIS — I5021 Acute systolic (congestive) heart failure: Secondary | ICD-10-CM

## 2015-02-23 LAB — GLUCOSE, CAPILLARY
GLUCOSE-CAPILLARY: 230 mg/dL — AB (ref 65–99)
GLUCOSE-CAPILLARY: 131 mg/dL — AB (ref 65–99)
GLUCOSE-CAPILLARY: 115 mg/dL — AB (ref 65–99)
Glucose-Capillary: 136 mg/dL — ABNORMAL HIGH (ref 65–99)
Glucose-Capillary: 141 mg/dL — ABNORMAL HIGH (ref 65–99)
Glucose-Capillary: 131 mg/dL — ABNORMAL HIGH (ref 65–99)
Glucose-Capillary: 122 mg/dL — ABNORMAL HIGH (ref 65–99)

## 2015-02-23 LAB — CBC
HCT: 38 % — ABNORMAL LOW (ref 39.0–52.0)
HEMOGLOBIN: 12.6 g/dL — AB (ref 13.0–17.0)
MCH: 33.6 pg (ref 26.0–34.0)
MCHC: 33.2 g/dL (ref 30.0–36.0)
MCV: 101.3 fL — ABNORMAL HIGH (ref 78.0–100.0)
Platelets: 105 10*3/uL — ABNORMAL LOW (ref 150–400)
RBC: 3.75 MIL/uL — ABNORMAL LOW (ref 4.22–5.81)
RDW: 13 % (ref 11.5–15.5)
WBC: 7.4 10*3/uL (ref 4.0–10.5)

## 2015-02-23 LAB — BASIC METABOLIC PANEL
Anion gap: 6 (ref 5–15)
BUN: 13 mg/dL (ref 6–20)
CHLORIDE: 104 mmol/L (ref 101–111)
CO2: 25 mmol/L (ref 22–32)
CREATININE: 0.9 mg/dL (ref 0.61–1.24)
Calcium: 8.6 mg/dL — ABNORMAL LOW (ref 8.9–10.3)
GFR calc Af Amer: >60 mL/min (ref >60)
GFR calc non Af Amer: >60 mL/min (ref >60)
Glucose, Bld: 201 mg/dL — ABNORMAL HIGH (ref 65–99)
Potassium: 3.3 mmol/L — ABNORMAL LOW (ref 3.5–5.1)
SODIUM: 135 mmol/L (ref 135–145)

## 2015-02-23 LAB — TROPONIN I: Troponin I: 0.25 ng/mL — ABNORMAL HIGH (ref <0.031)

## 2015-02-23 LAB — PHOSPHORUS: Phosphorus: 1.4 mg/dL — ABNORMAL LOW (ref 2.5–4.6)

## 2015-02-23 LAB — HEMOGLOBIN A1C
HEMOGLOBIN A1C: 5 % (ref 4.8–5.6)
MEAN PLASMA GLUCOSE: 97 mg/dL

## 2015-02-23 LAB — HEPARIN LEVEL (UNFRACTIONATED): Heparin Unfractionated: 0.39 IU/mL (ref 0.30–0.70)

## 2015-02-23 LAB — MAGNESIUM: MAGNESIUM: 2.1 mg/dL (ref 1.7–2.4)

## 2015-02-23 MED ORDER — DABIGATRAN ETEXILATE MESYLATE 150 MG PO CAPS
150.0000 mg | ORAL_CAPSULE | Freq: Two times a day (BID) | ORAL | Status: DC
  Administered 2015-02-23 – 2015-02-26 (×7): 150 mg via ORAL
  Filled 2015-02-23 (×9): qty 1

## 2015-02-23 MED ORDER — DILTIAZEM HCL 30 MG PO TABS
45.0000 mg | ORAL_TABLET | Freq: Four times a day (QID) | ORAL | Status: DC
  Administered 2015-02-23 – 2015-02-24 (×2): 45 mg via ORAL
  Filled 2015-02-23 (×7): qty 0.5

## 2015-02-23 MED ORDER — LEVALBUTEROL HCL 0.63 MG/3ML IN NEBU: 0.6300 mg | INHALATION_SOLUTION | Freq: Three times a day (TID) | RESPIRATORY_TRACT | Status: DC

## 2015-02-23 MED ORDER — POTASSIUM CHLORIDE 10 MEQ/100ML IV SOLN
10.0000 meq | INTRAVENOUS | Status: AC
  Administered 2015-02-23 (×4): 10 meq via INTRAVENOUS
  Filled 2015-02-23 (×4): qty 100

## 2015-02-23 MED ORDER — SODIUM PHOSPHATE 3 MMOLE/ML IV SOLN
30.0000 mmol | Freq: Once | INTRAVENOUS | Status: AC
  Administered 2015-02-23: 30 mmol via INTRAVENOUS
  Filled 2015-02-23: qty 10

## 2015-02-23 MED ORDER — IPRATROPIUM BROMIDE 0.02 % IN SOLN
0.5000 mg | Freq: Three times a day (TID) | RESPIRATORY_TRACT | Status: DC | PRN
  Administered 2015-02-23: 0.5 mg via RESPIRATORY_TRACT
  Filled 2015-02-23: qty 2.5

## 2015-02-23 MED ORDER — PANTOPRAZOLE SODIUM 40 MG PO TBEC
40.0000 mg | DELAYED_RELEASE_TABLET | Freq: Every day | ORAL | Status: DC
  Administered 2015-02-23 – 2015-02-25 (×3): 40 mg via ORAL
  Filled 2015-02-23 (×3): qty 1

## 2015-02-23 MED ORDER — ISOSORBIDE MONONITRATE ER 30 MG PO TB24
30.0000 mg | ORAL_TABLET | Freq: Every day | ORAL | Status: DC
  Administered 2015-02-23 – 2015-02-26 (×4): 30 mg via ORAL
  Filled 2015-02-23 (×5): qty 1

## 2015-02-23 MED ORDER — DILTIAZEM HCL 90 MG PO TABS
45.0000 mg | ORAL_TABLET | Freq: Four times a day (QID) | ORAL | Status: DC
  Administered 2015-02-23: 45 mg via ORAL
  Filled 2015-02-23 (×4): qty 0.5

## 2015-02-23 MED ORDER — MORPHINE SULFATE (PF) 2 MG/ML IV SOLN
2.0000 mg | INTRAVENOUS | Status: DC | PRN
  Administered 2015-02-23: 2 mg via INTRAVENOUS
  Filled 2015-02-23: qty 1

## 2015-02-23 MED ORDER — CLONAZEPAM 0.5 MG PO TABS
0.5000 mg | ORAL_TABLET | Freq: Three times a day (TID) | ORAL | Status: DC
  Administered 2015-02-23 – 2015-02-26 (×9): 0.5 mg via ORAL
  Filled 2015-02-23 (×9): qty 1

## 2015-02-23 MED ORDER — POTASSIUM CHLORIDE 10 MEQ/50ML IV SOLN: 10.0000 meq | INTRAVENOUS | Status: DC

## 2015-02-23 MED ORDER — IPRATROPIUM BROMIDE 0.02 % IN SOLN: 0.5000 mg | Freq: Three times a day (TID) | RESPIRATORY_TRACT | Status: DC

## 2015-02-23 MED ORDER — LEVALBUTEROL HCL 0.63 MG/3ML IN NEBU
0.6300 mg | INHALATION_SOLUTION | Freq: Three times a day (TID) | RESPIRATORY_TRACT | Status: DC | PRN
  Administered 2015-02-23: 0.63 mg via RESPIRATORY_TRACT
  Filled 2015-02-23: qty 3

## 2015-02-23 NOTE — Progress Notes (Signed)
Subjective:  Feels better. Breathing better. CP 3am resolved.   Objective:  Vital Signs in the last 24 hours: Temp:  [97.9 F (36.6 C)-98.7 F (37.1 C)] 97.9 F (36.6 C) (11/03 0400) Pulse Rate:  [32-155] 79 (11/03 0700) Resp:  [21-42] 27 (11/03 0700) BP: (83-159)/(61-137) 108/68 mmHg (11/03 0700) SpO2:  [88 %-99 %] 99 % (11/03 0816) Weight:  [209 lb 10.5 oz (95.1 kg)-220 lb (99.791 kg)] 209 lb 10.5 oz (95.1 kg) (11/03 0500)  Intake/Output from previous day: 11/02 0701 - 11/03 0700 In: 1253 [P.O.:360; I.V.:650; IV Piggyback:243] Out: 1985 [Urine:1985]   Physical Exam: General: Well developed, well nourished, in no acute distress. Head:  Normocephalic and atraumatic. Lungs: Clear to auscultation and percussion. Heart: Irreg irreg improved rate.  No murmur, rubs or gallops.  Abdomen: soft, non-tender, positive bowel sounds. Extremities: No clubbing or cyanosis. No edema. Neurologic: Alert and oriented x 3.    Lab Results:  Recent Labs  02/22/15 0959 02/23/15 0102  WBC 8.0 7.4  HGB 13.2 12.6*  PLT 103* 105*    Recent Labs  02/22/15 0959 02/23/15 0102  NA 140 135  K 4.2 3.3*  CL 110 104  CO2 24 25  GLUCOSE 121* 201*  BUN 7 13  CREATININE 0.72 0.90    Recent Labs  02/22/15 1858 02/23/15 0102  TROPONINI 0.34* 0.25*   Hepatic Function Panel  Recent Labs  02/22/15 0959  PROT 6.8  ALBUMIN 3.1*  AST 115*  ALT 99*  ALKPHOS 62  BILITOT 1.6*  BILIDIR 0.5  IBILI 1.1*   No results for input(s): CHOL in the last 72 hours. No results for input(s): PROTIME in the last 72 hours.  Imaging: Dg Chest Port 1 View  02/23/2015  CLINICAL DATA:  Pulmonary edema.  Shortness of breath. EXAM: PORTABLE CHEST 1 VIEW COMPARISON:  02/22/2015. FINDINGS: Cardiomegaly with bilateral pulmonary alveolar infiltrates consistent with pulmonary edema. Pulmonary edema has progressed from prior exam. No pleural effusion or pneumothorax. IMPRESSION: Cardiomegaly with  progressive bilateral pulmonary edema. Bilateral pneumonia cannot be excluded. Electronically Signed   By: Maisie Fushomas  Register   On: 02/23/2015 07:01   Dg Chest Portable 1 View  02/22/2015  CLINICAL DATA:  Shortness of breath and cough for 1 day; chest pain EXAM: PORTABLE CHEST 1 VIEW COMPARISON:  January 18, 2014 FINDINGS: There is interstitial edema, more on the left than on the right. Mild patchy alveolar edema is noted in the left mid and lower lung zones. There is atelectasis in the medial right base. There is cardiomegaly with pulmonary venous hypertension. No adenopathy appreciable. IMPRESSION: Evidence of a degree of congestive heart failure with more edema on the left than on the right. Medial right base atelectasis. Electronically Signed   By: Bretta BangWilliam  Woodruff III M.D.   On: 02/22/2015 10:10   Personally viewed.   Telemetry: AFIB improved rate 90-100 currently.  Personally viewed.     Cardiac Studies:  ECHO normal EF 70%, dilated LA  Scheduled Meds: . antiseptic oral rinse  7 mL Mouth Rinse BID  . aspirin  81 mg Oral Daily  . digoxin  0.125 mg Oral Daily  . folic acid  1 mg Oral Daily  . furosemide  40 mg Intravenous BID  . insulin aspart  0-15 Units Subcutaneous TID WC  . ipratropium  0.5 mg Nebulization QID  . levalbuterol  0.63 mg Nebulization QID  . multivitamin with minerals  1 tablet Oral Daily  . pantoprazole (PROTONIX) IV  40 mg Intravenous QHS  . sodium phosphate  Dextrose 5% IVPB  30 mmol Intravenous Once  . thiamine  100 mg Oral Daily   Continuous Infusions: . sodium chloride 10 mL/hr at 02/22/15 1610  . diltiazem (CARDIZEM) infusion 5 mg/hr (02/23/15 0003)  . heparin 1,700 Units/hr (02/23/15 0003)   PRN Meds:.LORazepam, morphine injection, ondansetron (ZOFRAN) IV  Assessment/Plan:  Principal Problem:   Acute respiratory distress (HCC) Active Problems:   Hypertension   Alcohol abuse   Cocaine abuse   Atrial fibrillation with rapid ventricular response  (HCC) - CHADSVASC = 2   Acute CHF (congestive heart failure) (HCC)   Elevated troponin   Respiratory failure (HCC)   59 year old parox AFIB in setting of resp failure/pulm edema/diastolic HF/cocaine  AFIB  - improved rate as lungs improve  - CXR edema still present, continue lasix 40 BID IV  - Dilt drip now 5. Loaded Dig yesterday with difficult to control rate.   - Will likely be able to stop digoxin soon.   - Could convert to PO diltiazem later today if HR remains stable.   - Restart Pradaxa. Stop heparin IV. Stop low dose ASA since we are using Pradaxa  Trop mildly elevated, flat, 0.44 peak  - likely demand ischemia in setting of RVR/resp distress  - has moderate CAD on prior Cath  - continues to use cocaine based on UTOX although he denies knowingly using (thinks X girlfriend may have laced his mariajuana.)  - Continue to promote cessation. No further cardiac invasive workup at this time given ongoing cocaine use. No ST elevation on ECG, however ST depression with TWI noted precordial leads.    - Will start Imdur 30. Had brief episode of CP.   - Avoiding metoprolol because of cocaine use.   Respiratory failure/COPD  - improving  ETOH and cocaine cessation      SKAINS, MARK 02/23/2015, 9:20 AM

## 2015-02-23 NOTE — Consult Note (Addendum)
Name: Kevin Orozco MRN: [id number] DOB: 07/05/1955    ADMISSION DATE:  02/22/2015 CONSULTATION DATE:  11/2  REFERRING MD :  Anne Fu   CHIEF COMPLAINT:  Respiratory failure, HTN   BRIEF PATIENT DESCRIPTION:  59yo male smoker with hx DM, CAD, HTN, AFib, polysubstance abuse presented 11/2 with SOB, chest pain, cough.  Found to be in AFib with RVR, UDS + for cocaine.  In ER required bipap briefly and PCCM asked to admit to ICU.    SIGNIFICANT EVENTS    STUDIES:  2D echo 11/2>>>70%, difficult study  SUBJECTIVE:  Remains on cardizem drip, resp statu simproved  VITAL SIGNS: Temp:  [97.9 F (36.6 C)-98.7 F (37.1 C)] 98.6 F (37 C) (11/03 1129) Pulse Rate:  [32-137] 95 (11/03 1000) Resp:  [22-42] 22 (11/03 1000) BP: (83-157)/(59-100) 102/59 mmHg (11/03 1000) SpO2:  [88 %-99 %] 95 % (11/03 1023) Weight:  [95.1 kg (209 lb 10.5 oz)] 95.1 kg (209 lb 10.5 oz) (11/03 0500)  PHYSICAL EXAMINATION:  General:  Chronically ill appearing male, appears older than stated age, mild distress in chair Neuro:  Awake, alert, appropriate, MAE  HEENT:  Mm moist, no JVD Cardiovascular:  s1s2 irreg, AFib rate now wnl Lungs:  Less wheezing Abdomen:  Round, soft, NT Musculoskeletal:  Warm and dry, scant BLE edema    Recent Labs Lab 02/22/15 0959 02/23/15 0102  NA 140 135  K 4.2 3.3*  CL 110 104  CO2 24 25  BUN 7 13  CREATININE 0.72 0.90  GLUCOSE 121* 201*    Recent Labs Lab 02/22/15 0959 02/23/15 0102  HGB 13.2 12.6*  HCT 39.1 38.0*  WBC 8.0 7.4  PLT 103* 105*   Dg Chest Port 1 View  02/23/2015  CLINICAL DATA:  Pulmonary edema.  Shortness of breath. EXAM: PORTABLE CHEST 1 VIEW COMPARISON:  02/22/2015. FINDINGS: Cardiomegaly with bilateral pulmonary alveolar infiltrates consistent with pulmonary edema. Pulmonary edema has progressed from prior exam. No pleural effusion or pneumothorax. IMPRESSION: Cardiomegaly with progressive bilateral pulmonary edema. Bilateral pneumonia cannot be  excluded. Electronically Signed   By: Maisie Fus  Register   On: 02/23/2015 07:01   Dg Chest Portable 1 View  02/22/2015  CLINICAL DATA:  Shortness of breath and cough for 1 day; chest pain EXAM: PORTABLE CHEST 1 VIEW COMPARISON:  January 18, 2014 FINDINGS: There is interstitial edema, more on the left than on the right. Mild patchy alveolar edema is noted in the left mid and lower lung zones. There is atelectasis in the medial right base. There is cardiomegaly with pulmonary venous hypertension. No adenopathy appreciable. IMPRESSION: Evidence of a degree of congestive heart failure with more edema on the left than on the right. Medial right base atelectasis. Electronically Signed   By: Bretta Bang III M.D.   On: 02/22/2015 10:10    ASSESSMENT / PLAN:  Acute respiratory failure - r/t pulmonary edema in setting AFib with RVR, cocaine abuse, HTN. Tobacco abuse  Probable COPD   PLAN -  BDers to reduce to prn pcx rin am  Neg balance goals remain Refused NIMV, will dc as slight better No role roids Avoiding albuterol Avoid cocaine abuse  Acute systolic and diastolic CHF  Pulmonary edema  HTN with probable chronic diastolic dysfxn  Atrial fibrillation with RVR Hx CAD  Mildly elevated troponin  PLAN -  Cards following  Echo reasuring Trend troponin _NOT, dc cardizem gtt per cards, to 65-105, goal to dc this with dig load, follow needs further as we  re reduce May need load oral cardzizem No statin with abnormal LFT's  Heparin gtt off per cards Hold home metoprolol unopposed alpha risk supp k ,phos  Hx ETOH  Abnormal LFT's  Polysubstance abuse  PLAN -  CIWA protocol NOT in icu Add oral home klonopin Thiamine, folate  Heart healthy diet  F/u LFT's  Cessation counseling    DM2  PLAN -  SSI   Ccm time 30 min   Mcarthur Rossettianiel J. Tyson AliasFeinstein, MD, FACP Pgr: [phone] [facility]

## 2015-02-23 NOTE — Progress Notes (Signed)
ANTICOAGULATION CONSULT NOTE - Follow-up Consult  Pharmacy Consult for Heparin Indication: atrial fibrillation  No Known Allergies  Patient Measurements: Height: 5\' 11"  (180.3 cm) Weight: 220 lb (99.791 kg) IBW/kg (Calculated) : 75.3 Heparin Dosing Weight: 96 kg  Vital Signs: Temp: 98.4 F (36.9 C) (11/03 0000) Temp Source: Oral (11/03 0000) BP: 98/71 mmHg (11/03 0100) Pulse Rate: 83 (11/03 0100)  Labs:  Recent Labs  02/22/15 0959 02/22/15 1328 02/22/15 1858 02/23/15 0102  HGB 13.2  --   --  12.6*  HCT 39.1  --   --  38.0*  PLT 103*  --   --  105*  HEPARINUNFRC  --   --  0.17* 0.39  CREATININE 0.72  --   --   --   TROPONINI 0.39* 0.44* 0.34*  --     Assessment: 59 yo M on heparin for afib. Heparin level therapeutic (0.39) on 1700 units/hr. Level drawn about 5 hours post bolus and increased gtt rate. No bleeding noted. Plt low but stable.  Goal of Therapy:  Heparin level 0.3-0.7 units/ml Monitor platelets by anticoagulation protocol: Yes   Plan:  - Continue heparin at 1700 units/hr - F/u 6 hr level to confirm therapeutic  Christoper Fabianaron Abagael Kramm, PharmD, BCPS Clinical pharmacist, pager [phone] 02/23/2015 2:11 AM

## 2015-02-23 NOTE — Progress Notes (Signed)
Pt with acute onset L sided chest pain, non radiating described as pressure and rated 3/10.  VS stable on 5mg  cardizem gtt and 1700 U/hr Heparin gtt.   Elink MD notified, order received for 12 lead EKG and Morphine for pain control.  EKG conducted and images uploaded in Epic.

## 2015-02-23 NOTE — Progress Notes (Signed)
eLink Physician-Brief Progress Note Patient Name: Kevin PilgrimJoseph Orozco DOB: 06/15/1955 MRN: [id number]   Date of Service  02/23/2015  HPI/Events of Note  Patient with acute Chest pain Cocaine+, on heparin infusion already   eICU Interventions  Will get EKG, morphine as needed        Erin FullingKASA, [name] 02/23/2015, 3:20 AM

## 2015-02-24 DIAGNOSIS — E1121 Type 2 diabetes mellitus with diabetic nephropathy: Secondary | ICD-10-CM

## 2015-02-24 LAB — GLUCOSE, CAPILLARY
GLUCOSE-CAPILLARY: 107 mg/dL — AB (ref 65–99)
GLUCOSE-CAPILLARY: 83 mg/dL (ref 65–99)
Glucose-Capillary: 100 mg/dL — ABNORMAL HIGH (ref 65–99)
Glucose-Capillary: 86 mg/dL (ref 65–99)
Glucose-Capillary: 101 mg/dL — ABNORMAL HIGH (ref 65–99)

## 2015-02-24 LAB — CBC WITH DIFFERENTIAL/PLATELET
Basophils Absolute: 0 10*3/uL (ref 0.0–0.1)
Basophils Relative: 0 %
EOS ABS: 0 10*3/uL (ref 0.0–0.7)
EOS PCT: 0 %
HCT: 35.8 % — ABNORMAL LOW (ref 39.0–52.0)
Hemoglobin: 12.1 g/dL — ABNORMAL LOW (ref 13.0–17.0)
LYMPHS ABS: 2.7 10*3/uL (ref 0.7–4.0)
Lymphocytes Relative: 29 %
MCH: 35 pg — AB (ref 26.0–34.0)
MCHC: 33.8 g/dL (ref 30.0–36.0)
MCV: 103.5 fL — ABNORMAL HIGH (ref 78.0–100.0)
MONO ABS: 0.4 10*3/uL (ref 0.1–1.0)
MONOS PCT: 5 %
Neutro Abs: 6.1 10*3/uL (ref 1.7–7.7)
Neutrophils Relative %: 66 %
PLATELETS: 107 10*3/uL — AB (ref 150–400)
RBC: 3.46 MIL/uL — AB (ref 4.22–5.81)
RDW: 13.1 % (ref 11.5–15.5)
WBC: 9.2 10*3/uL (ref 4.0–10.5)

## 2015-02-24 LAB — COMPREHENSIVE METABOLIC PANEL
ALT: 60 U/L (ref 17–63)
AST: 52 U/L — AB (ref 15–41)
Albumin: 2.6 g/dL — ABNORMAL LOW (ref 3.5–5.0)
Alkaline Phosphatase: 39 U/L (ref 38–126)
Anion gap: 6 (ref 5–15)
BUN: 17 mg/dL (ref 6–20)
CHLORIDE: 106 mmol/L (ref 101–111)
CO2: 26 mmol/L (ref 22–32)
CREATININE: 0.84 mg/dL (ref 0.61–1.24)
Calcium: 8.1 mg/dL — ABNORMAL LOW (ref 8.9–10.3)
GFR calc Af Amer: >60 mL/min (ref >60)
GLUCOSE: 111 mg/dL — AB (ref 65–99)
Potassium: 3.5 mmol/L (ref 3.5–5.1)
Sodium: 138 mmol/L (ref 135–145)
Total Bilirubin: 1.1 mg/dL (ref 0.3–1.2)
Total Protein: 6.1 g/dL — ABNORMAL LOW (ref 6.5–8.1)

## 2015-02-24 LAB — TROPONIN I: TROPONIN I: 0.2 ng/mL — AB (ref <0.031)

## 2015-02-24 LAB — MAGNESIUM: MAGNESIUM: 2.1 mg/dL (ref 1.7–2.4)

## 2015-02-24 MED ORDER — DILTIAZEM HCL ER COATED BEADS 180 MG PO CP24
180.0000 mg | ORAL_CAPSULE | Freq: Every day | ORAL | Status: DC
  Administered 2015-02-24 – 2015-02-25 (×2): 180 mg via ORAL
  Filled 2015-02-24 (×2): qty 1

## 2015-02-24 MED ORDER — METHYLPREDNISOLONE SODIUM SUCC 125 MG IJ SOLR
60.0000 mg | INTRAMUSCULAR | Status: DC
  Administered 2015-02-24 – 2015-02-25 (×2): 60 mg via INTRAVENOUS
  Filled 2015-02-24 (×2): qty 2

## 2015-02-24 MED ORDER — DM-GUAIFENESIN ER 30-600 MG PO TB12
1.0000 | ORAL_TABLET | Freq: Two times a day (BID) | ORAL | Status: DC
  Administered 2015-02-24 – 2015-02-26 (×4): 1 via ORAL
  Filled 2015-02-24 (×4): qty 1

## 2015-02-24 MED ORDER — LEVALBUTEROL HCL 0.63 MG/3ML IN NEBU
0.6300 mg | INHALATION_SOLUTION | Freq: Three times a day (TID) | RESPIRATORY_TRACT | Status: DC
  Administered 2015-02-24 – 2015-02-25 (×2): 0.63 mg via RESPIRATORY_TRACT
  Filled 2015-02-24 (×3): qty 3

## 2015-02-24 NOTE — Progress Notes (Addendum)
[facility] TEAM 1 - Stepdown/ICU TEAM Progress Note  Kevin Orozco ZOX:[id number] DOB: 1955/08/04 DOA: 02/22/2015 PCP: Tomi Bamberger, NP  Admit HPI / Brief Narrative: 59 y.o. WM PMHx Substance Abuse (Cocaine, Marijuana, ETOH), Nonobstructive CAD native artery at cath '08, HTN, HLD, PAF with RVR no longer on Pradaxa, DM Type 2 controlled, Tobacco Abuse , positive HCV.   This past weekend he drank a fifth of liquor and 2 - 12-packs beer. Also did THC and continues to smoke. Denies cocaine use (but UDS +). Pt woke yesterday with chest pain at 8/10. He was SOB. Took NSAIDS with no help. SL NTG totaling 3 helped a little. The pain is in the center of his chest and also between shoulder blades. Worse with cough. No fever, but cough productive of yellow sputum.  This am, was feeling so bad he called 911. EMS gave him ASA x 4, SL NTG and IV Cardizem 30 mg. Meds helped some, but still with 6/10 pain and severe SOB. No palpitations, not aware of rapid or irregular HR. Seems very SOB and really struggles to breathe, even though sats 90% at rest on room air.  Uses no inhalers at home. Occasionally hears himself wheeze. Coughs daily. Could afford Pradaxa on Medicaid but quit taking it because he was bleeding too much from minor cuts (works with tools at home).   HPI/Subjective: 11/4 A/O 4, positive productive cough (yellow), positive SOB (improved from admission), negative N/V  Assessment/Plan:  Acute diastolic heart failure - Checking echocardiogram - IV Lasix administered. 40 mg twice a day - BiPAP if necessary per critical care team - Rate control atrial fibrillation  Atrial fibrillation with rapid ventricular response - Cardizem XR 180 mg daily - Maintaining blood pressure well. Would not suggest urgent cardioversion as he would likely revert back to atrial fibrillation. -Restart Pradaxa 150 mg BID  Elevated troponin - Minimally elevated, could be consistent with acute heart  failure/respiratory failure. We will continue to trend. If it increases significantly this would be more consistent with non-ST elevation myocardial infarction in the setting of cocaine use. - Prior cardiac catheterization in 2008 nonobstructive moderate LAD disease, mild circumflex and RCA disease. Prior stress test in 2013 abnormal, attributed to cocaine use. Previous echocardiogram showed ejection fraction of 55-60% with moderate LVH and moderate left atrial dilatation.  Tobacco use/Cocaine /ETOH/Marijuana (polysubstance abuse) - Notices wheezing at home occasionally, smoker's cough. - Patient counseled extensively on sequela of continuing to polysubstance abuse and smoke to include death . -Continue folate 1 mg daily -Continue thiamine 100 mg daily  COPD Exacerbation -Xopenex TID -Solu-Medrol 60 mg daily for short course -Flutter valve -Ambulate patient q shift -Physiotherapy vest BID -Mucinex DM BID -Hold on starting antibiotics patient afebrile, negative leukocytosis  Acute respiratory failure with hypoxia and hypercapnia - Appreciate critical care medicine - Nebulizer/steroids  Hepatitis C positive/ alcoholic liver enzymes -Hepatitis C viral load pending  DM Type 2 controlled -11/2  Hemoglobin A1c= 5  Goals of care -Had long discussion with patient concerning his short-term and long-term goals of care. -Patient stated he would like to be made DO NOT RESUSCITATE, understanding we would continue to treat him aggressively, and only cease treatment if his heart were to stop or he was to stop breathing.   Code Status: DO NOT RESUSCITATE Family Communication: no family present at time of exam Disposition Plan: Resolution COPD exacerbation    Consultants:   Procedure/Significant Events: 2D echo 11/2>>>70%, difficult study   Culture 11/2 MRSA by  PCR negative 11/4 HIV pending    Antibiotics: NA  DVT prophylaxis: Pradaxa   Devices    LINES / TUBES:       Continuous Infusions:   Objective: VITAL SIGNS:   SPO2; FIO2:  No intake or output data in the 24 hours ending 03/23/15 0733   Exam: General: A/O 4, positive acute respiratory distress Eyes: Negative headache, negative scleral hemorrhage ENT: Negative Runny nose, negative ear pain, negative gingival bleeding, poor dentation Neck:  Negative scars, masses, torticollis, lymphadenopathy, JVD Lungs: diffuse inspiratory and expiratory wheezing, negative crackles  Cardiovascular: Irregular irregular rhythm and rate, without murmur gallop or rub normal S1 and S2 Abdomen:negative abdominal pain, nondistended, positive soft, bowel sounds, no rebound, no ascites, no appreciable mass Extremities: No significant cyanosis, clubbing, or edema bilateral lower extremities Psychiatric:  Negative depression, negative anxiety, negative fatigue, negative mania  Neurologic:  Cranial nerves II through XII intact, tongue/uvula midline, all extremities muscle strength 5/5, sensation intact throughout, negative dysarthria, negative expressive aphasia, negative receptive aphasia.   Data Reviewed: Basic Metabolic Panel: No results for input(s): NA, K, CL, CO2, GLUCOSE, BUN, CREATININE, CALCIUM, MG, PHOS in the last 168 hours. Liver Function Tests: No results for input(s): AST, ALT, ALKPHOS, BILITOT, PROT, ALBUMIN in the last 168 hours. No results for input(s): LIPASE, AMYLASE in the last 168 hours. No results for input(s): AMMONIA in the last 168 hours. CBC: No results for input(s): WBC, NEUTROABS, HGB, HCT, MCV, PLT in the last 168 hours. Cardiac Enzymes: No results for input(s): CKTOTAL, CKMB, CKMBINDEX, TROPONINI in the last 168 hours. BNP (last 3 results)  Recent Labs  02/22/15 0959  BNP 808.2*    ProBNP (last 3 results) No results for input(s): PROBNP in the last 8760 hours.  CBG: No results for input(s): GLUCAP in the last 168 hours.  No results found for this or any previous  visit (from the past 240 hour(s)).   Studies:  Recent x-ray studies have been reviewed in detail by the Attending Physician  Scheduled Meds:  Scheduled Meds:   Time spent on care of this patient: 40 mins   WOODS, Roselind MessierURTIS J , MD  [facility] Office  [phone] Pager [phone]  On-Call/Text Page:      [email or web address]      password [email or web address]  If 7PM-7AM, please contact night-coverage [email or web address] Password [email or web address] 03/23/2015, 7:33 AM   LOS: 4 days   Care during the described time interval was provided by me .  I have reviewed this patient's available data, including medical history, events of note, physical examination, and all test results as part of my evaluation. I have personally reviewed and interpreted all radiology studies.   Carolyne Littlesurtis Woods, MD [phone] Pager

## 2015-02-24 NOTE — Clinical Documentation Improvement (Signed)
Cardiology  (please document your query response in the progress notes and discharge summary, not on the query form itself.)  For greater specificity, please document the type of atrial fibrillation monitored and treated this admission:  - Chronic  - Paroxsymal  - Persistent  - Other type  - Unable to clinically determine  Please exercise your independent, professional judgment when responding. A specific answer is not anticipated or expected.   Thank You,  Jerral Ralphathy R Latanza Pfefferkorn  RN BSN CCDS [phone] Health Information Management [place]

## 2015-02-24 NOTE — Progress Notes (Signed)
Patient has arrived to room 3E02 from 66M. Patient alert and oriented x4 with no complaints at this time. Patient placed on telemetry, CCMD notified. Patient oriented to unit. Call bell in reach. Will continue to monitor.

## 2015-02-24 NOTE — Progress Notes (Signed)
Pt refuses NIV. Pt states he does not like it blowing in his face and its uncomfortable.

## 2015-02-24 NOTE — Progress Notes (Signed)
Flutter done and education given to pt. Flutter at bedside.

## 2015-02-24 NOTE — Progress Notes (Addendum)
Subjective:  Feels better. Breathing better. Still with cough, mild wheeze. No further CP. AFIB now on PO dilt. Reasonable control.    Objective:  Vital Signs in the last 24 hours: Temp:  [97.6 F (36.4 C)-98.9 F (37.2 C)] 97.6 F (36.4 C) (11/04 1026) Pulse Rate:  [28-112] 101 (11/04 1026) Resp:  [18-31] 18 (11/04 1026) BP: (80-129)/(57-116) 121/60 mmHg (11/04 1026) SpO2:  [92 %-99 %] 96 % (11/04 1026) Weight:  [205 lb 8 oz (93.214 kg)-208 lb 8.9 oz (94.6 kg)] 205 lb 8 oz (93.214 kg) (11/04 1026)  Intake/Output from previous day: 11/03 0701 - 11/04 0700 In: 743 [P.O.:360; I.V.:211; IV Piggyback:172] Out: 1050 [Urine:1050]   Physical Exam: General: Well developed, well nourished, in no acute distress. Head:  Normocephalic and atraumatic. Lungs: Mild wheeze bilat. Heart: Irreg irreg improved rate.  No murmur, rubs or gallops.  Abdomen: soft, non-tender, positive bowel sounds. Extremities: No clubbing or cyanosis. No edema. Neurologic: Alert and oriented x 3.    Lab Results:  Recent Labs  02/23/15 0102 02/24/15 0944  WBC 7.4 9.2  HGB 12.6* 12.1*  PLT 105* 107*    Recent Labs  02/23/15 0102 02/24/15 0944  NA 135 138  K 3.3* 3.5  CL 104 106  CO2 25 26  GLUCOSE 201* 111*  BUN 13 17  CREATININE 0.90 0.84    Recent Labs  02/22/15 1858 02/23/15 0102  TROPONINI 0.34* 0.25*   Hepatic Function Panel  Recent Labs  02/22/15 0959 02/24/15 0944  PROT 6.8 6.1*  ALBUMIN 3.1* 2.6*  AST 115* 52*  ALT 99* 60  ALKPHOS 62 39  BILITOT 1.6* 1.1  BILIDIR 0.5  --   IBILI 1.1*  --    No results for input(s): CHOL in the last 72 hours. No results for input(s): PROTIME in the last 72 hours.  Imaging: Dg Chest Port 1 View  02/23/2015  CLINICAL DATA:  Pulmonary edema.  Shortness of breath. EXAM: PORTABLE CHEST 1 VIEW COMPARISON:  02/22/2015. FINDINGS: Cardiomegaly with bilateral pulmonary alveolar infiltrates consistent with pulmonary edema. Pulmonary edema  has progressed from prior exam. No pleural effusion or pneumothorax. IMPRESSION: Cardiomegaly with progressive bilateral pulmonary edema. Bilateral pneumonia cannot be excluded. Electronically Signed   By: Maisie Fus  Register   On: 02/23/2015 07:01   Personally viewed.   Telemetry: AFIB improved rate 90-100 currently.  Personally viewed.     Cardiac Studies:  ECHO normal EF 70%, dilated LA  Scheduled Meds: . antiseptic oral rinse  7 mL Mouth Rinse BID  . clonazePAM  0.5 mg Oral TID  . dabigatran  150 mg Oral Q12H  . digoxin  0.125 mg Oral Daily  . diltiazem  45 mg Oral 4 times per day  . folic acid  1 mg Oral Daily  . insulin aspart  0-15 Units Subcutaneous TID WC  . isosorbide mononitrate  30 mg Oral Daily  . multivitamin with minerals  1 tablet Oral Daily  . pantoprazole  40 mg Oral Q2000  . thiamine  100 mg Oral Daily   Continuous Infusions: . sodium chloride Stopped (02/23/15 1900)   PRN Meds:.ipratropium, levalbuterol, morphine injection, ondansetron (ZOFRAN) IV  Assessment/Plan:  Principal Problem:   Acute respiratory distress (HCC) Active Problems:   Hypertension   Alcohol abuse   Cocaine abuse   Atrial fibrillation with rapid ventricular response (HCC) - CHADSVASC = 2   Acute CHF (congestive heart failure) (HCC)   Elevated troponin   Respiratory failure (HCC)  Acute congestive heart failure (HCC)   59 year old parox AFIB in setting of resp failure/pulm edema/diastolic HF/cocaine  AFIB  - improved rate as lungs improve  - CXR edema still present, continue lasix 40 BID IV  - Change diltiazem to 180 QD   - DC digoxin   - Restarted Pradaxa. Stopped low dose ASA since we are using Pradaxa     Trop mildly elevated, flat, 0.44 peak  - likely demand ischemia in setting of RVR/resp distress  - has moderate CAD on prior Cath  - continues to use cocaine based on UTOX although he denies knowingly using (thinks X girlfriend may have laced his mariajuana.)  - Continue  to promote cessation. No further cardiac invasive workup at this time given ongoing cocaine use. No ST elevation on ECG, however ST depression with TWI noted precordial leads.    - Will start Imdur 30. Had brief episode of CP.   - Avoiding metoprolol because of cocaine use.   Respiratory failure/COPD/diastolic HF  - improving but still mild wheeze B.   - one more day of IV lasix  - EF normal  - Creat stable.   ETOH and cocaine cessation      Anjoli Diemer 02/24/2015, 11:06 AM

## 2015-02-24 NOTE — Progress Notes (Signed)
Pt refuse CPT chest vest but will comply and cooperative with Flutter valve.

## 2015-02-24 NOTE — Discharge Instructions (Signed)
Atrial Fibrillation °Atrial fibrillation is a type of irregular or rapid heartbeat (arrhythmia). In atrial fibrillation, the heart quivers continuously in a chaotic pattern. This occurs when parts of the heart receive disorganized signals that make the heart unable to pump blood normally. This can increase the risk for stroke, heart failure, and other heart-related conditions. There are different types of atrial fibrillation, including: °· Paroxysmal atrial fibrillation. This type starts suddenly, and it usually stops on its own shortly after it starts. °· Persistent atrial fibrillation. This type often lasts longer than a week. It may stop on its own or with treatment. °· Long-lasting persistent atrial fibrillation. This type lasts longer than 12 months. °· Permanent atrial fibrillation. This type does not go away. °Talk with your health care provider to learn about the type of atrial fibrillation that you have. °CAUSES °This condition is caused by some heart-related conditions or procedures, including: °· A heart attack. °· Coronary artery disease. °· Heart failure. °· Heart valve conditions. °· High blood pressure. °· Inflammation of the sac that surrounds the heart (pericarditis). °· Heart surgery. °· Certain heart rhythm disorders, such as Wolf-Parkinson-White syndrome. °Other causes include: °· Pneumonia. °· Obstructive sleep apnea. °· Blockage of an artery in the lungs (pulmonary embolism, or PE). °· Lung cancer. °· Chronic lung disease. °· Thyroid problems, especially if the thyroid is overactive (hyperthyroidism). °· Caffeine. °· Excessive alcohol use or illegal drug use. °· Use of some medicines, including certain decongestants and diet pills. °Sometimes, the cause cannot be found. °RISK FACTORS °This condition is more likely to develop in: °· People who are older in age. °· People who smoke. °· People who have diabetes mellitus. °· People who are overweight (obese). °· Athletes who exercise  vigorously. °SYMPTOMS °Symptoms of this condition include: °· A feeling that your heart is beating rapidly or irregularly. °· A feeling of discomfort or pain in your chest. °· Shortness of breath. °· Sudden light-headedness or weakness. °· Getting tired easily during exercise. °In some cases, there are no symptoms. °DIAGNOSIS °Your health care provider may be able to detect atrial fibrillation when taking your pulse. If detected, this condition may be diagnosed with: °· An electrocardiogram (ECG). °· A Holter monitor test that records your heartbeat patterns over a 24-hour period. °· Transthoracic echocardiogram (TTE) to evaluate how blood flows through your heart. °· Transesophageal echocardiogram (TEE) to view more detailed images of your heart. °· A stress test. °· Imaging tests, such as a CT scan or chest X-ray. °· Blood tests. °TREATMENT °The main goals of treatment are to prevent blood clots from forming and to keep your heart beating at a normal rate and rhythm. The type of treatment that you receive depends on many factors, such as your underlying medical conditions and how you feel when you are experiencing atrial fibrillation. °This condition may be treated with: °· Medicine to slow down the heart rate, bring the heart's rhythm back to normal, or prevent clots from forming. °· Electrical cardioversion. This is a procedure that resets your heart's rhythm by delivering a controlled, low-energy shock to the heart through your skin. °· Different types of ablation, such as catheter ablation, catheter ablation with pacemaker, or surgical ablation. These procedures destroy the heart tissues that send abnormal signals. When the pacemaker is used, it is placed under your skin to help your heart beat in a regular rhythm. °HOME CARE INSTRUCTIONS °· Take over-the counter and prescription medicines only as told by your health care provider. °·   If your health care provider prescribed a blood-thinning medicine  (anticoagulant), take it exactly as told. Taking too much blood-thinning medicine can cause bleeding. If you do not take enough blood-thinning medicine, you will not have the protection that you need against stroke and other problems.  Do not use tobacco products, including cigarettes, chewing tobacco, and e-cigarettes. If you need help quitting, ask your health care provider.  If you have obstructive sleep apnea, manage your condition as told by your health care provider.  Do not drink alcohol.  Do not drink beverages that contain caffeine, such as coffee, soda, and tea.  Maintain a healthy weight. Do not use diet pills unless your health care provider approves. Diet pills may make heart problems worse.  Follow diet instructions as told by your health care provider.  Exercise regularly as told by your health care provider.  Keep all follow-up visits as told by your health care provider. This is important. PREVENTION  Avoid drinking beverages that contain caffeine or alcohol.  Avoid certain medicines, especially medicines that are used for breathing problems.  Avoid certain herbs and herbal medicines, such as those that contain ephedra or ginseng.  Do not use illegal drugs, such as cocaine and amphetamines.  Do not smoke.  Manage your high blood pressure. SEEK MEDICAL CARE IF:  You notice a change in the rate, rhythm, or strength of your heartbeat.  You are taking an anticoagulant and you notice increased bruising.  You tire more easily when you exercise or exert yourself. SEEK IMMEDIATE MEDICAL CARE IF:  You have chest pain, abdominal pain, sweating, or weakness.  You feel nauseous.  You notice blood in your vomit, bowel movement, or urine.  You have shortness of breath.  You suddenly have swollen feet and ankles.  You feel dizzy.  You have sudden weakness or numbness of the face, arm, or leg, especially on one side of the body.  You have trouble speaking,  trouble understanding, or both (aphasia).  Your face or your eyelid droops on one side. These symptoms may represent a serious problem that is an emergency. Do not wait to see if the symptoms will go away. Get medical help right away. Call your local emergency services (911 in the [place] Do not drive yourself to the hospital.   This information is not intended to replace advice given to you by your health care provider. Make sure you discuss any questions you have with your health care provider.   Document Released: 04/08/2005 Document Revised: 12/28/2014 Document Reviewed: 08/03/2014 Elsevier Interactive Patient Education 2016 [employer]. Information on my medicine - Pradaxa (dabigatran)  This medication education was reviewed with me or my healthcare representative as part of my discharge preparation.  The pharmacist that spoke with me during my hospital stay was:  Elwin Sleight, [facility]  Why was Pradaxa prescribed for you? Pradaxa was prescribed for you to reduce the risk of forming blood clots that cause a stroke if you have a medical condition called atrial fibrillation (a type of irregular heartbeat).    What do you Need to know about PradAXa? Take your Pradaxa TWICE DAILY - one capsule in the morning and one tablet in the evening with or without food.  It would be best to take the doses about the same time each day.  The capsules should not be broken, chewed or opened - they must be swallowed whole.  Do not store Pradaxa in other medication containers - once the bottle is opened  the Pradaxa should be used within FOUR months; throw away any capsules that havent been by that time.  Take Pradaxa exactly as prescribed by your doctor.  DO NOT stop taking Pradaxa without talking to the doctor who prescribed the medication.  Stopping without other stroke prevention medication to take the place of Pradaxa may increase your risk of developing a clot that causes a stroke.  Refill your  prescription before you run out.  After discharge, you should have regular check-up appointments with your healthcare provider that is prescribing your Pradaxa.  In the future your dose may need to be changed if your kidney function or weight changes by a significant amount.  What do you do if you miss a dose? If you miss a dose, take it as soon as you remember on the same day.  If your next dose is less than 6 hours away, skip the missed dose.  Do not take two doses of PRADAXA at the same time.  Important Safety Information A possible side effect of Pradaxa is bleeding. You should call your healthcare provider right away if you experience any of the following: ? Bleeding from an injury or your nose that does not stop. ? Unusual colored urine (red or dark brown) or unusual colored stools (red or black). ? Unusual bruising for unknown reasons. ? A serious fall or if you hit your head (even if there is no bleeding).  Some medicines may interact with Pradaxa and might increase your risk of bleeding or clotting while on Pradaxa. To help avoid this, consult your healthcare provider or pharmacist prior to using any new prescription or non-prescription medications, including herbals, vitamins, non-steroidal anti-inflammatory drugs (NSAIDs) and supplements.  This website has more information on Pradaxa (dabigatran): [email or web address].[email or web address]

## 2015-02-25 DIAGNOSIS — B199 Unspecified viral hepatitis without hepatic coma: Secondary | ICD-10-CM | POA: Diagnosis present

## 2015-02-25 DIAGNOSIS — I5033 Acute on chronic diastolic (congestive) heart failure: Secondary | ICD-10-CM | POA: Diagnosis present

## 2015-02-25 DIAGNOSIS — Z720 Tobacco use: Secondary | ICD-10-CM | POA: Diagnosis present

## 2015-02-25 DIAGNOSIS — J9602 Acute respiratory failure with hypercapnia: Secondary | ICD-10-CM

## 2015-02-25 DIAGNOSIS — I5031 Acute diastolic (congestive) heart failure: Secondary | ICD-10-CM

## 2015-02-25 DIAGNOSIS — F1210 Cannabis abuse, uncomplicated: Secondary | ICD-10-CM | POA: Diagnosis present

## 2015-02-25 DIAGNOSIS — I4891 Unspecified atrial fibrillation: Secondary | ICD-10-CM | POA: Diagnosis present

## 2015-02-25 DIAGNOSIS — I5032 Chronic diastolic (congestive) heart failure: Secondary | ICD-10-CM | POA: Diagnosis present

## 2015-02-25 DIAGNOSIS — B1920 Unspecified viral hepatitis C without hepatic coma: Secondary | ICD-10-CM

## 2015-02-25 DIAGNOSIS — F1910 Other psychoactive substance abuse, uncomplicated: Secondary | ICD-10-CM | POA: Diagnosis present

## 2015-02-25 DIAGNOSIS — J441 Chronic obstructive pulmonary disease with (acute) exacerbation: Secondary | ICD-10-CM

## 2015-02-25 DIAGNOSIS — R7989 Other specified abnormal findings of blood chemistry: Secondary | ICD-10-CM

## 2015-02-25 DIAGNOSIS — J449 Chronic obstructive pulmonary disease, unspecified: Secondary | ICD-10-CM | POA: Diagnosis present

## 2015-02-25 DIAGNOSIS — J9601 Acute respiratory failure with hypoxia: Secondary | ICD-10-CM | POA: Diagnosis present

## 2015-02-25 LAB — TROPONIN I
TROPONIN I: 0.14 ng/mL — AB (ref <0.031)
TROPONIN I: 0.09 ng/mL — AB (ref <0.031)
Troponin I: 0.1 ng/mL — ABNORMAL HIGH (ref <0.031)

## 2015-02-25 LAB — GLUCOSE, CAPILLARY
GLUCOSE-CAPILLARY: 168 mg/dL — AB (ref 65–99)
Glucose-Capillary: 147 mg/dL — ABNORMAL HIGH (ref 65–99)
Glucose-Capillary: 131 mg/dL — ABNORMAL HIGH (ref 65–99)
Glucose-Capillary: 124 mg/dL — ABNORMAL HIGH (ref 65–99)

## 2015-02-25 LAB — COMPREHENSIVE METABOLIC PANEL
ALK PHOS: 54 U/L (ref 38–126)
ALT: 63 U/L (ref 17–63)
AST: 47 U/L — AB (ref 15–41)
Albumin: 2.7 g/dL — ABNORMAL LOW (ref 3.5–5.0)
Anion gap: 7 (ref 5–15)
BUN: 13 mg/dL (ref 6–20)
CALCIUM: 8.5 mg/dL — AB (ref 8.9–10.3)
CO2: 25 mmol/L (ref 22–32)
CREATININE: 0.76 mg/dL (ref 0.61–1.24)
Chloride: 106 mmol/L (ref 101–111)
Glucose, Bld: 140 mg/dL — ABNORMAL HIGH (ref 65–99)
Potassium: 4.1 mmol/L (ref 3.5–5.1)
SODIUM: 138 mmol/L (ref 135–145)
Total Bilirubin: 1.3 mg/dL — ABNORMAL HIGH (ref 0.3–1.2)
Total Protein: 6.3 g/dL — ABNORMAL LOW (ref 6.5–8.1)

## 2015-02-25 LAB — CBC WITH DIFFERENTIAL/PLATELET
Basophils Absolute: 0 10*3/uL (ref 0.0–0.1)
Basophils Relative: 0 %
Eosinophils Absolute: 0 10*3/uL (ref 0.0–0.7)
Eosinophils Relative: 0 %
HCT: 39 % (ref 39.0–52.0)
HEMOGLOBIN: 12.8 g/dL — AB (ref 13.0–17.0)
LYMPHS ABS: 1 10*3/uL (ref 0.7–4.0)
LYMPHS PCT: 25 %
MCH: 33.8 pg (ref 26.0–34.0)
MCHC: 32.8 g/dL (ref 30.0–36.0)
MCV: 102.9 fL — AB (ref 78.0–100.0)
Monocytes Absolute: 0.2 10*3/uL (ref 0.1–1.0)
Monocytes Relative: 5 %
NEUTROS PCT: 70 %
Neutro Abs: 2.9 10*3/uL (ref 1.7–7.7)
Platelets: 129 10*3/uL — ABNORMAL LOW (ref 150–400)
RBC: 3.79 MIL/uL — AB (ref 4.22–5.81)
RDW: 13.1 % (ref 11.5–15.5)
WBC: 4.1 10*3/uL (ref 4.0–10.5)

## 2015-02-25 LAB — RAPID HIV SCREEN (HIV 1/2 AB+AG)
HIV 1/2 ANTIBODIES: NONREACTIVE
HIV-1 P24 ANTIGEN - HIV24: NONREACTIVE

## 2015-02-25 LAB — HCV RNA QUANT
HCV Quantitative Log: 6.049 log10 IU/mL (ref >1.70)
HCV Quantitative: 1120000 IU/mL (ref >50)

## 2015-02-25 LAB — MAGNESIUM: Magnesium: 2.1 mg/dL (ref 1.7–2.4)

## 2015-02-25 MED ORDER — LEVALBUTEROL HCL 0.63 MG/3ML IN NEBU
0.6300 mg | INHALATION_SOLUTION | Freq: Two times a day (BID) | RESPIRATORY_TRACT | Status: DC
  Administered 2015-02-25 – 2015-02-26 (×2): 0.63 mg via RESPIRATORY_TRACT
  Filled 2015-02-25 (×2): qty 3

## 2015-02-25 MED ORDER — FUROSEMIDE 10 MG/ML IJ SOLN
40.0000 mg | Freq: Two times a day (BID) | INTRAMUSCULAR | Status: DC
  Filled 2015-02-25: qty 4

## 2015-02-25 MED ORDER — NITROGLYCERIN 0.4 MG SL SUBL
SUBLINGUAL_TABLET | SUBLINGUAL | Status: DC
Start: 2015-02-25 — End: 2015-02-25
  Filled 2015-02-25: qty 1

## 2015-02-25 MED ORDER — IPRATROPIUM BROMIDE 0.02 % IN SOLN
0.5000 mg | Freq: Two times a day (BID) | RESPIRATORY_TRACT | Status: DC
  Administered 2015-02-25 – 2015-02-26 (×2): 0.5 mg via RESPIRATORY_TRACT
  Filled 2015-02-25 (×2): qty 2.5

## 2015-02-25 MED ORDER — DILTIAZEM HCL 60 MG PO TABS
60.0000 mg | ORAL_TABLET | Freq: Four times a day (QID) | ORAL | Status: DC
  Administered 2015-02-25 – 2015-02-26 (×3): 60 mg via ORAL
  Filled 2015-02-25 (×3): qty 1

## 2015-02-25 MED ORDER — LABETALOL HCL 200 MG PO TABS
300.0000 mg | ORAL_TABLET | Freq: Two times a day (BID) | ORAL | Status: DC
  Administered 2015-02-25 – 2015-02-26 (×3): 300 mg via ORAL
  Filled 2015-02-25 (×6): qty 1

## 2015-02-25 NOTE — Procedures (Signed)
Pt does not wear BiPAP at home and refuses to wear.

## 2015-02-25 NOTE — Progress Notes (Signed)
Pt was refusing his Lasix tonight, RN advised pt on the importance of the medicine, but pt stated still would not take medicine, pt also wanted to get nebulizer breathing tx set up at home, Thanks Lavonda JumboMike F RN

## 2015-02-25 NOTE — Progress Notes (Signed)
At 0907 pt called with c/o "10" chest pain with radiation to jaw. Bp 110/84  Hr a-fib fluctuating 110's to 140's. Ekg shows a-fib, ekg done. Md paged. 1 SL ntg given at0921.

## 2015-02-25 NOTE — Progress Notes (Signed)
[facility] TEAM 1 - Stepdown/ICU TEAM Progress Note  Kevin Orozco ZOX:[id number] DOB: 03/02/1956 DOA: 02/22/2015 PCP: Tomi Bamberger, NP  Admit HPI / Brief Narrative: 59 y.o. WM PMHx Substance Abuse (Cocaine, Marijuana, ETOH), Nonobstructive CAD native artery at cath '08, HTN, HLD, PAF with RVR no longer on Pradaxa, DM Type 2 controlled, Tobacco Abuse , positive HCV.   This past weekend he drank a fifth of liquor and 2 - 12-packs beer. Also did THC and continues to smoke. Denies cocaine use (but UDS +). Pt woke yesterday with chest pain at 8/10. He was SOB. Took NSAIDS with no help. SL NTG totaling 3 helped a little. The pain is in the center of his chest and also between shoulder blades. Worse with cough. No fever, but cough productive of yellow sputum.  This am, was feeling so bad he called 911. EMS gave him ASA x 4, SL NTG and IV Cardizem 30 mg. Meds helped some, but still with 6/10 pain and severe SOB. No palpitations, not aware of rapid or irregular HR. Seems very SOB and really struggles to breathe, even though sats 90% at rest on room air.  Uses no inhalers at home. Occasionally hears himself wheeze. Coughs daily. Could afford Pradaxa on Medicaid but quit taking it because he was bleeding too much from minor cuts (works with tools at home).   HPI/Subjective: 11/5 A/O 4, positive productive cough (yellow), positive SOB (improved from admission), positive CP especially this a.m. better with sitting up and leaning forward.   Assessment/Plan:  Acute diastolic heart failure - Echocardiogram; see results below -Lasix IV 40 mg BID -Diltiazem 60 mg QID -Imdur 30 mg daily -Labetalol 300 mg BID -In and out; since admission -2.1 L -Daily weight  Atrial fibrillation with rapid ventricular response - See acute diastolic CHF -Continue Pradaxa 150 mg BID  Elevated troponin - Trending down, continue to monitor especially with patient's recent cocaine use  - Prior cardiac  catheterization in 2008 nonobstructive moderate LAD disease, mild circumflex and RCA disease. Prior stress test in 2013 abnormal, attributed to cocaine use. Previous echocardiogram showed ejection fraction of 55-60% with moderate LVH and moderate left atrial dilatation. -Per cardiology No further cardiac invasive workup at this time given ongoing cocaine use. No ST elevation on ECG, however ST depression with TWI noted precordial leads.   Tobacco abuse/Cocaine /ETOH/Marijuana (polysubstance abuse) - Notices wheezing at home occasionally, smoker's cough. - Patient counseled extensively on sequela of continuing to polysubstance abuse and smoke to include death . -Continue folate 1 mg daily -Continue thiamine 100 mg daily  COPD Exacerbation -Xopenex TID -Solu-Medrol 60 mg daily for short course -Flutter valve -Ambulate patient q shift -Physiotherapy vest BID -Mucinex DM BID -Hold on starting antibiotics patient afebrile, negative leukocytosis  Acute respiratory failure with hypoxia and hypercapnia - Nebulizer/steroids  Hepatitis C positive/ alcoholic liver enzymes -Hepatitis C viral load 1.12 million   DM Type 2 controlled -57/2  Hemoglobin A1c= 5  Goals of care -Had long discussion with patient concerning his short-term and long-term goals of care. -Patient stated he would like to be made DO NOT RESUSCITATE, understanding we would continue to treat him aggressively, and only cease treatment if his heart were to stop or he was to stop breathing.   Code Status: DO NOT RESUSCITATE Family Communication: no family present at time of exam Disposition Plan: Resolution COPD exacerbation    Consultants:   Procedure/Significant Events: 2D echo 11/2>>> LVEF=70%, difficult study- Left atrium:  moderately dilated.  Culture 11/2 MRSA by PCR negative 11/4 HIV pending    Antibiotics: NA  DVT prophylaxis: Pradaxa   Devices    LINES / TUBES:      Continuous  Infusions: . sodium chloride Stopped (02/23/15 1900)    Objective: VITAL SIGNS: Temp: 98.1 F (36.7 C) (11/05 1156) Temp Source: Oral (11/05 1156) BP: 114/67 mmHg (11/05 1156) Pulse Rate: 93 (11/05 1156) SPO2; FIO2:   Intake/Output Summary (Last 24 hours) at 02/25/15 1926 Last data filed at 02/25/15 1813  Gross per 24 hour  Intake    720 ml  Output   1425 ml  Net   -705 ml     Exam: General: A/O 4, negative acute respiratory distress Eyes: Negative headache, negative scleral hemorrhage ENT: Negative Runny nose, negative ear pain, negative gingival bleeding, poor dentation Neck:  Negative scars, masses, torticollis, lymphadenopathy, JVD Lungs: diffuse inspiratory and expiratory wheezing, negative crackles  Cardiovascular: Irregular irregular rhythm and rate, without murmur gallop or rub normal S1 and S2. Pain to palpation sternum and left lateral chest wall Abdomen:negative abdominal pain, nondistended, positive soft, bowel sounds, no rebound, no ascites, no appreciable mass Extremities: No significant cyanosis, clubbing, or edema bilateral lower extremities Psychiatric:  Negative depression, negative anxiety, negative fatigue, negative mania  Neurologic:  Cranial nerves II through XII intact, tongue/uvula midline, all extremities muscle strength 5/5, sensation intact throughout, negative dysarthria, negative expressive aphasia, negative receptive aphasia.   Data Reviewed: Basic Metabolic Panel:  Recent Labs Lab 02/22/15 0959 02/23/15 0102 02/24/15 0944 02/25/15 0745  NA 140 135 138 138  K 4.2 3.3* 3.5 4.1  CL 110 104 106 106  CO2 24 25 26 25   GLUCOSE 121* 201* 111* 140*  BUN 7 13 17 13   CREATININE 0.72 0.90 0.84 0.76  CALCIUM 8.6* 8.6* 8.1* 8.5*  MG 1.4* 2.1 2.1 2.1  PHOS  --  1.4*  --   --    Liver Function Tests:  Recent Labs Lab 02/22/15 0959 02/24/15 0944 02/25/15 0745  AST 115* 52* 47*  ALT 99* 60 63  ALKPHOS 62 39 54  BILITOT 1.6* 1.1 1.3*    PROT 6.8 6.1* 6.3*  ALBUMIN 3.1* 2.6* 2.7*   No results for input(s): LIPASE, AMYLASE in the last 168 hours. No results for input(s): AMMONIA in the last 168 hours. CBC:  Recent Labs Lab 02/22/15 0959 02/23/15 0102 02/24/15 0944 02/25/15 0745  WBC 8.0 7.4 9.2 4.1  NEUTROABS 5.5  --  6.1 2.9  HGB 13.2 12.6* 12.1* 12.8*  HCT 39.1 38.0* 35.8* 39.0  MCV 102.4* 101.3* 103.5* 102.9*  PLT 103* 105* 107* 129*   Cardiac Enzymes:  Recent Labs Lab 02/22/15 1858 02/23/15 0102 02/24/15 1954 02/25/15 0136 02/25/15 0745  TROPONINI 0.34* 0.25* 0.20* 0.14* 0.10*   BNP (last 3 results)  Recent Labs  02/22/15 0959  BNP 808.2*    ProBNP (last 3 results) No results for input(s): PROBNP in the last 8760 hours.  CBG:  Recent Labs Lab 02/24/15 1631 02/24/15 2111 02/25/15 0547 02/25/15 1113 02/25/15 1601  GLUCAP 101* 83 168* 147* 131*    Recent Results (from the past 240 hour(s))  MRSA PCR Screening     Status: None   Collection Time: 02/22/15  2:11 PM  Result Value Ref Range Status   MRSA by PCR NEGATIVE NEGATIVE Final    Comment:        The GeneXpert MRSA Assay (FDA approved for NASAL specimens only), is one component of a comprehensive MRSA  colonization surveillance program. It is not intended to diagnose MRSA infection nor to guide or monitor treatment for MRSA infections.      Studies:  Recent x-ray studies have been reviewed in detail by the Attending Physician  Scheduled Meds:  Scheduled Meds: . antiseptic oral rinse  7 mL Mouth Rinse BID  . clonazePAM  0.5 mg Oral TID  . dabigatran  150 mg Oral Q12H  . dextromethorphan-guaiFENesin  1 tablet Oral BID  . diltiazem  60 mg Oral QID  . folic acid  1 mg Oral Daily  . furosemide  40 mg Intravenous BID  . insulin aspart  0-15 Units Subcutaneous TID WC  . ipratropium  0.5 mg Nebulization BID  . isosorbide mononitrate  30 mg Oral Daily  . labetalol  300 mg Oral BID  . levalbuterol  0.63 mg Nebulization  BID  . methylPREDNISolone (SOLU-MEDROL) injection  60 mg Intravenous Q24H  . multivitamin with minerals  1 tablet Oral Daily  . pantoprazole  40 mg Oral Q2000  . thiamine  100 mg Oral Daily    Time spent on care of this patient: 40 mins   Ivy Puryear, Roselind Messier , MD  [facility] Office  [phone] Pager [phone]  On-Call/Text Page:      [email or web address]      password [email or web address]  If 7PM-7AM, please contact night-coverage [email or web address] Password [email or web address] 02/25/2015, 7:26 PM   LOS: 3 days   Care during the described time interval was provided by me .  I have reviewed this patient's available data, including medical history, events of note, physical examination, and all test results as part of my evaluation. I have personally reviewed and interpreted all radiology studies.   Carolyne Littles, MD [phone] Pager

## 2015-02-25 NOTE — Progress Notes (Signed)
Patient ID: Kevin Orozco, male   DOB: 01/22/1956, 59 y.o.   MRN: [id number]    Subjective:  Positional chest pain  Afib rate not controlled 140-156  Objective:  Vital Signs in the last 24 hours: Temp:  [97.3 F (36.3 C)-98.1 F (36.7 C)] 98.1 F (36.7 C) (11/05 0752) Pulse Rate:  [84-129] 129 (11/05 0921) Resp:  [18-29] 18 (11/05 0752) BP: (109-126)/(54-84) 109/82 mmHg (11/05 0921) SpO2:  [93 %-98 %] 98 % (11/05 0752) Weight:  [91.899 kg (202 lb 9.6 oz)-93.214 kg (205 lb 8 oz)] 91.899 kg (202 lb 9.6 oz) (11/05 0503)  Intake/Output from previous day: 11/04 0701 - 11/05 0700 In: 920 [P.O.:920] Out: 2300 [Urine:2300]   Physical Exam: General: Well developed, well nourished, in no acute distress. Head:  Normocephalic and atraumatic. Lungs: Mild wheeze bilat. Heart: Irreg irreg improved rate.  No murmur, rubs or gallops.  Abdomen: soft, non-tender, positive bowel sounds. Extremities: No clubbing or cyanosis. No edema. Neurologic: Alert and oriented x 3.    Lab Results:  Recent Labs  02/24/15 0944 02/25/15 0745  WBC 9.2 4.1  HGB 12.1* 12.8*  PLT 107* 129*    Recent Labs  02/24/15 0944 02/25/15 0745  NA 138 138  K 3.5 4.1  CL 106 106  CO2 26 25  GLUCOSE 111* 140*  BUN 17 13  CREATININE 0.84 0.76    Recent Labs  02/25/15 0136 02/25/15 0745  TROPONINI 0.14* 0.10*   Hepatic Function Panel  Recent Labs  02/22/15 0959  02/25/15 0745  PROT 6.8  < > 6.3*  ALBUMIN 3.1*  < > 2.7*  AST 115*  < > 47*  ALT 99*  < > 63  ALKPHOS 62  < > 54  BILITOT 1.6*  < > 1.3*  BILIDIR 0.5  --   --   IBILI 1.1*  --   --   < > = values in this interval not displayed. No results for input(s): CHOL in the last 72 hours. No results for input(s): PROTIME in the last 72 hours.  Imaging: No results found. Personally viewed.   Telemetry: AFIB improved rate 140-156 currently.  Personally viewed.     Cardiac Studies:  ECHO normal EF 70%, dilated LA  Scheduled Meds: .  antiseptic oral rinse  7 mL Mouth Rinse BID  . clonazePAM  0.5 mg Oral TID  . dabigatran  150 mg Oral Q12H  . dextromethorphan-guaiFENesin  1 tablet Oral BID  . diltiazem  60 mg Oral QID  . folic acid  1 mg Oral Daily  . insulin aspart  0-15 Units Subcutaneous TID WC  . isosorbide mononitrate  30 mg Oral Daily  . labetalol  300 mg Oral BID  . levalbuterol  0.63 mg Nebulization 3 times per day  . methylPREDNISolone (SOLU-MEDROL) injection  60 mg Intravenous Q24H  . multivitamin with minerals  1 tablet Oral Daily  . nitroGLYCERIN      . pantoprazole  40 mg Oral Q2000  . thiamine  100 mg Oral Daily   Continuous Infusions: . sodium chloride Stopped (02/23/15 1900)   PRN Meds:.ipratropium, morphine injection, ondansetron (ZOFRAN) IV  Assessment/Plan:  Principal Problem:   Acute respiratory distress (HCC) Active Problems:   Hypertension   Alcohol abuse   Cocaine abuse   Atrial fibrillation with rapid ventricular response (HCC) - CHADSVASC = 2   Acute CHF (congestive heart failure) (HCC)   Elevated troponin   Respiratory failure (HCC)   Acute congestive heart failure (HCC)  59 year old parox AFIB in setting of resp failure/pulm edema/diastolic HF/cocaine  AFIB  On Pradaxa.  Stop LA cardizem  STart q6 and labatolol ( beta blocker with alpha activity ok with cocaine and he has not had it in his system for 6 days)      Trop mildly elevated, flat, 0.44 peak  - likely demand ischemia in setting of RVR/resp distress  Primary service can consider using toradol for positional chest pain   - has moderate CAD on prior Cath  - continues to use cocaine based on UTOX although he denies knowingly using (thinks X girlfriend may have laced his mariajuana.)  - Continue to promote cessation. No further cardiac invasive workup at this time given ongoing cocaine use. No ST elevation on ECG, however ST depression with TWI noted precordial leads.    - Will start Imdur 30. Had brief episode of CP.      Respiratory failure/COPD/diastolic HF  - improving but still mild wheeze B.   - one more day of IV lasix  - EF normal  - Creat stable.   ETOH and cocaine cessation      Charlton Haws 02/25/2015, 9:38 AM

## 2015-02-26 DIAGNOSIS — N182 Chronic kidney disease, stage 2 (mild): Secondary | ICD-10-CM

## 2015-02-26 DIAGNOSIS — R894 Abnormal immunological findings in specimens from other organs, systems and tissues: Secondary | ICD-10-CM

## 2015-02-26 DIAGNOSIS — E1122 Type 2 diabetes mellitus with diabetic chronic kidney disease: Secondary | ICD-10-CM | POA: Diagnosis present

## 2015-02-26 DIAGNOSIS — B1710 Acute hepatitis C without hepatic coma: Secondary | ICD-10-CM | POA: Diagnosis present

## 2015-02-26 LAB — COMPREHENSIVE METABOLIC PANEL
ALT: 56 U/L (ref 17–63)
ANION GAP: 6 (ref 5–15)
AST: 46 U/L — ABNORMAL HIGH (ref 15–41)
Albumin: 2.5 g/dL — ABNORMAL LOW (ref 3.5–5.0)
Alkaline Phosphatase: 60 U/L (ref 38–126)
BUN: 19 mg/dL (ref 6–20)
CHLORIDE: 106 mmol/L (ref 101–111)
CO2: 24 mmol/L (ref 22–32)
Calcium: 8 mg/dL — ABNORMAL LOW (ref 8.9–10.3)
Creatinine, Ser: 0.87 mg/dL (ref 0.61–1.24)
GFR calc non Af Amer: >60 mL/min (ref >60)
Glucose, Bld: 163 mg/dL — ABNORMAL HIGH (ref 65–99)
POTASSIUM: 4 mmol/L (ref 3.5–5.1)
SODIUM: 136 mmol/L (ref 135–145)
Total Bilirubin: 0.8 mg/dL (ref 0.3–1.2)
Total Protein: 5.8 g/dL — ABNORMAL LOW (ref 6.5–8.1)

## 2015-02-26 LAB — GLUCOSE, CAPILLARY
GLUCOSE-CAPILLARY: 131 mg/dL — AB (ref 65–99)
GLUCOSE-CAPILLARY: 123 mg/dL — AB (ref 65–99)

## 2015-02-26 LAB — CBC WITH DIFFERENTIAL/PLATELET
Basophils Absolute: 0 10*3/uL (ref 0.0–0.1)
Basophils Relative: 0 %
EOS ABS: 0 10*3/uL (ref 0.0–0.7)
EOS PCT: 0 %
HCT: 33.8 % — ABNORMAL LOW (ref 39.0–52.0)
Hemoglobin: 11.3 g/dL — ABNORMAL LOW (ref 13.0–17.0)
LYMPHS ABS: 1.1 10*3/uL (ref 0.7–4.0)
Lymphocytes Relative: 16 %
MCH: 34.5 pg — AB (ref 26.0–34.0)
MCHC: 33.4 g/dL (ref 30.0–36.0)
MCV: 103 fL — ABNORMAL HIGH (ref 78.0–100.0)
MONO ABS: 0.2 10*3/uL (ref 0.1–1.0)
Monocytes Relative: 4 %
Neutro Abs: 5.5 10*3/uL (ref 1.7–7.7)
Neutrophils Relative %: 80 %
PLATELETS: 122 10*3/uL — AB (ref 150–400)
RBC: 3.28 MIL/uL — AB (ref 4.22–5.81)
RDW: 13.2 % (ref 11.5–15.5)
WBC: 6.9 10*3/uL (ref 4.0–10.5)

## 2015-02-26 LAB — MAGNESIUM: MAGNESIUM: 2.2 mg/dL (ref 1.7–2.4)

## 2015-02-26 LAB — TROPONIN I
TROPONIN I: 0.07 ng/mL — AB (ref <0.031)
TROPONIN I: 0.08 ng/mL — AB (ref <0.031)

## 2015-02-26 MED ORDER — LABETALOL HCL 300 MG PO TABS
300.0000 mg | ORAL_TABLET | Freq: Two times a day (BID) | ORAL | Status: DC
Start: 2015-02-26 — End: 2016-09-10

## 2015-02-26 MED ORDER — FOLIC ACID 1 MG PO TABS: 1.0000 mg | ORAL_TABLET | Freq: Every day | ORAL | Status: DC

## 2015-02-26 MED ORDER — DILTIAZEM HCL 60 MG PO TABS
60.0000 mg | ORAL_TABLET | Freq: Four times a day (QID) | ORAL | Status: DC
Start: 2015-02-26 — End: 2017-09-10

## 2015-02-26 MED ORDER — THIAMINE HCL 100 MG PO TABS
100.0000 mg | ORAL_TABLET | Freq: Every day | ORAL | Status: DC
Start: 2015-02-26 — End: 2016-09-10

## 2015-02-26 MED ORDER — ISOSORBIDE MONONITRATE ER 30 MG PO TB24
30.0000 mg | ORAL_TABLET | Freq: Every day | ORAL | Status: DC
Start: 2015-02-26 — End: 2021-06-26

## 2015-02-26 MED ORDER — DABIGATRAN ETEXILATE MESYLATE 150 MG PO CAPS: 150.0000 mg | ORAL_CAPSULE | Freq: Two times a day (BID) | ORAL | Status: DC

## 2015-02-26 NOTE — Discharge Summary (Addendum)
Physician Discharge Summary  Kevin Orozco WUJ:[id number] DOB: 1955/11/10 DOA: 02/22/2015  PCP: Kevin Bamberger, NP  Admit date: 02/22/2015 Discharge date: 02/26/2015  Time spent: 40 minutes  Recommendations for Outpatient Follow-up:  Acute diastolic heart failure - Echocardiogram; see results below -Diltiazem 60 mg QID -Imdur 30 mg daily -Labetalol 300 mg BID -In and out; since admission - 2.4 L -Daily weight; 11/6 standing weight= 92.1 kg -Follow-up with Dr. Leodis Sias (cardiology) in one month  Atrial fibrillation with rapid ventricular response - See acute diastolic CHF -Continue Pradaxa 150 mg BID  Elevated troponin - Trending down, continue to monitor especially with patient's recent cocaine use  - Prior cardiac catheterization in 2008 nonobstructive moderate LAD disease, mild circumflex and RCA disease. Prior stress test in 2013 abnormal, attributed to cocaine use. Previous echocardiogram showed ejection fraction of 55-60% with moderate LVH and moderate left atrial dilatation. -Per cardiology No further cardiac invasive workup at this time given ongoing cocaine use. No ST elevation on ECG, however ST depression with TWI noted precordial leads.   Tobacco abuse/Cocaine /ETOH/Marijuana (polysubstance abuse) - Patient counseled extensively on sequela of continuing to polysubstance abuse and smoke to include death . -Continue folate 1 mg daily -Continue thiamine 100 mg daily  COPD Exacerbation -Stable -Establish care with [facility] in 6 weeks for spirometry pre-/post bronchodilator, DLCO  Acute respiratory failure with hypoxia and hypercapnia - Nebulizer/steroids  Hepatitis C positive/ alcoholic liver enzymes -Hepatitis C viral load 1.12 million  -Follow-up with NP Kevin Orozco (PCP) acute diastolic heart failure, A. fib with RVR, polysubstance abuse, will need to be referred to ID physician for hepatitis C treatment  DM Type 2 controlled -11/2  Hemoglobin A1c= 5     Discharge Diagnoses:  Principal Problem:   Acute respiratory distress (HCC) Active Problems:   Hypertension   Alcohol abuse   Cocaine abuse   Atrial fibrillation with rapid ventricular response (HCC) - CHADSVASC = 2   Acute CHF (congestive heart failure) (HCC)   Elevated troponin   Respiratory failure (HCC)   Acute congestive heart failure (HCC)   Acute diastolic CHF (congestive heart failure) (HCC)   Atrial fibrillation with RVR (HCC)   Tobacco abuse   Polysubstance abuse   Marijuana abuse   COPD exacerbation (HCC)   Acute respiratory failure with hypoxia and hypercapnia (HCC)   Hepatitis, viral   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 WM PMHx Substance Abuse (Cocaine, Marijuana, ETOH), Nonobstructive CAD native artery at cath '08, HTN, HLD, PAF with RVR no longer on Pradaxa, DM Type 2 controlled, Tobacco Abuse , positive HCV.   This past weekend he drank a fifth of liquor and 2 - 12-packs beer. Also did THC and continues to smoke. Denies cocaine use (but UDS +). Pt woke yesterday with chest pain at 8/10. He was SOB. Took NSAIDS with no help. SL NTG totaling 3 helped a little. The pain is in the center of his chest and also between shoulder blades. Worse with cough. No fever, but cough productive of yellow sputum.  This am, was feeling so bad he called 911. EMS gave him  ASA x 4, SL NTG and IV Cardizem 30 mg. Meds helped some, but still with 6/10 pain and severe SOB. No palpitations, not aware of rapid or irregular HR. Seems very SOB and really struggles to breathe, even though sats 90% at rest on room  air.  Uses no inhalers at home. Occasionally hears himself wheeze. Coughs daily. Could afford Pradaxa on Medicaid but quit taking it because he was bleeding too much from minor cuts (works with tools at home).  Hospital Course:  59 y.o. WM PMHx Substance Abuse (Cocaine, Marijuana, ETOH), Nonobstructive CAD native artery at cath '08, HTN, HLD, PAF with RVR no longer on Pradaxa, DM Type 2 controlled, Tobacco Abuse , positive HCV.   This past weekend he drank a fifth of liquor and 2 - 12-packs beer. Also did THC and continues to smoke. Denies cocaine use (but UDS +). Pt woke yesterday with chest pain at 8/10. He was SOB. Took NSAIDS with no help. SL NTG totaling 3 helped a little. The pain is in the center of his chest and also between shoulder blades. Worse with cough. No fever, but cough productive of yellow sputum.  This am, was feeling so bad he called 911. EMS gave him ASA x 4, SL NTG and IV Cardizem 30 mg. Meds helped some, but still with 6/10 pain and severe SOB. No palpitations, not aware of rapid or irregular HR. Seems very SOB and really struggles to breathe, even though sats 90% at rest on room air.  Uses no inhalers at home. Occasionally hears himself wheeze. Coughs daily. Could afford Pradaxa on Medicaid but quit taking it because he was bleeding too much from minor cuts (works with tools at home). During his hospitalization patient was treated for acute diastolic heart failure, atrial fibrillation with RVR most likely brought on by polysubstance abuse (marijuana, cocaine, alcohol). Patient responded well to diuresis, and was counseled on sequela of continuing to use marijuana, cocaine, alcohol to include death. Patient was also counseled to follow-up with his PCP in order to be referred to infectious disease physician for Hepatitis C.   Procedure/Significant Events: 2D echo 11/2>>> LVEF=70%, difficult study- Left atrium: moderately dilated.   Culture 11/2 MRSA by PCR negative 11/4  HIV pending     Discharge Exam: Filed Vitals:   02/25/15 2032 02/25/15 2215 02/26/15 0500 02/26/15 0925  BP:  104/65 105/62   Pulse:  69 81   Temp:   97.9 F (36.6 C)   TempSrc:   Oral   Resp:   18   Height:      Weight:   92.171 kg (203 lb 3.2 oz)   SpO2: 99%  97% 96%    General: A/O 4, negative acute respiratory distress Eyes: Negative headache, negative scleral hemorrhage ENT: Negative Runny nose, negative ear pain, negative gingival bleeding, poor dentation Neck: Negative scars, masses, torticollis, lymphadenopathy, JVD Lungs: clear to auscultation bilateral   Cardiovascular: NSR, without murmur gallop or rub normal S1 and S2. Pain to palpation sternum and left lateral chest wall Discharge Instructions     Medication List    STOP taking these medications        metoprolol succinate 50 MG 24 hr tablet  Commonly known as:  TOPROL-XL      TAKE these medications        aspirin 81 MG tablet  Take 81 mg by mouth daily.     clonazePAM 0.5 MG tablet  Commonly known as:  KLONOPIN  Take 0.5 mg  by mouth 3 (three) times daily.     dabigatran 150 MG Caps capsule  Commonly known as:  PRADAXA  Take 1 capsule (150 mg total) by mouth every 12 (twelve) hours.     diclofenac sodium 1 % Gel  Commonly known as:  VOLTAREN  Apply 2 g topically 4 (four) times daily.     diltiazem 60 MG tablet  Commonly known as:  CARDIZEM  Take 1 tablet (60 mg total) by mouth 4 (four) times daily.     folic acid 1 MG tablet  Commonly known as:  FOLVITE  Take 1 tablet (1 mg total) by mouth daily.     HYDROcodone-acetaminophen 5-325 MG tablet  Commonly known as:  NORCO/VICODIN  Take 1 tablet by mouth every 6 (six) hours as needed for moderate pain.     isosorbide mononitrate 30 MG 24 hr tablet  Commonly known as:  IMDUR  Take 1 tablet (30 mg total) by mouth daily.     labetalol 300 MG tablet  Commonly known as:  NORMODYNE  Take 1 tablet (300 mg total) by mouth 2 (two) times daily.      nitroGLYCERIN 0.4 MG SL tablet  Commonly known as:  NITROSTAT  Place under the tongue every 5 (five) minutes as needed for chest pain.     thiamine 100 MG tablet  Take 1 tablet (100 mg total) by mouth daily.       No Known Allergies Follow-up Information    Follow up with [facility]. Schedule an appointment as soon as possible for a visit in 6 weeks.   Specialty:  Pulmonology   Why:  Establish care with [facility] in 6 weeks for spirometry pre-/post bronchodilator, DLCO   Contact information:   [address]  [place] [id number] [phone]      Follow up with Nahser, Deloris PingPhilip J, MD. Schedule an appointment as soon as possible for a visit in 1 month.   Specialty:  Cardiology   Why:  Follow-up in one month with Dr. Leodis SiasPhillip Nahser for acute diastolic heart failure, atrial fibrillation with RVR   Contact information:   [address] [place] [address] [phone]       Follow up with Kevin BambergerFULLER,SUSAN, NP. Schedule an appointment as soon as possible for a visit in 2 weeks.   Specialty:  Nurse Practitioner   Why:  Follow-up with NP Kevin BambergerSusan Fuller acute diastolic heart failure, A. fib with RVR, polysubstance abuse, will need to be referred to ID physician for hepatitis C treatment   Contact information:   [place] [place] [address] [phone]        The results of significant diagnostics from this hospitalization (including imaging, microbiology, ancillary and laboratory) are listed below for reference.    Significant Diagnostic Studies: Dg Chest Port 1 View  02/23/2015  CLINICAL DATA:  Pulmonary edema.  Shortness of breath. EXAM: PORTABLE CHEST 1 VIEW COMPARISON:  02/22/2015. FINDINGS: Cardiomegaly with bilateral pulmonary alveolar infiltrates consistent with pulmonary edema. Pulmonary edema has progressed from prior exam. No pleural effusion or pneumothorax. IMPRESSION: Cardiomegaly with progressive  bilateral pulmonary edema. Bilateral pneumonia cannot be excluded. Electronically Signed   By: Maisie Fushomas  Register   On: 02/23/2015 07:01   Dg Chest Portable 1 View  02/22/2015  CLINICAL DATA:  Shortness of breath and cough for 1 day; chest pain EXAM: PORTABLE CHEST 1 VIEW COMPARISON:  January 18, 2014 FINDINGS: There is interstitial edema, more on the  left than on the right. Mild patchy alveolar edema is noted in the left mid and lower lung zones. There is atelectasis in the medial right base. There is cardiomegaly with pulmonary venous hypertension. No adenopathy appreciable. IMPRESSION: Evidence of a degree of congestive heart failure with more edema on the left than on the right. Medial right base atelectasis. Electronically Signed   By: Bretta Bang III M.D.   On: 02/22/2015 10:10    Microbiology: Recent Results (from the past 240 hour(s))  MRSA PCR Screening     Status: None   Collection Time: 02/22/15  2:11 PM  Result Value Ref Range Status   MRSA by PCR NEGATIVE NEGATIVE Final    Comment:        The GeneXpert MRSA Assay (FDA approved for NASAL specimens only), is one component of a comprehensive MRSA colonization surveillance program. It is not intended to diagnose MRSA infection nor to guide or monitor treatment for MRSA infections.      Labs: Basic Metabolic Panel:  Recent Labs Lab 02/22/15 0959 02/23/15 0102 02/24/15 0944 02/25/15 0745 02/26/15 0054  NA 140 135 138 138 136  K 4.2 3.3* 3.5 4.1 4.0  CL 110 104 106 106 106  CO2 GLUCOSE 121* 201* 111* 140* 163*  BUN CREATININE 0.72 0.90 0.84 0.76 0.87  CALCIUM 8.6* 8.6* 8.1* 8.5* 8.0*  MG 1.4* 2.1 2.1 2.1 2.2  PHOS  --  1.4*  --   --   --    Liver Function Tests:  Recent Labs Lab 02/22/15 0959 02/24/15 0944 02/25/15 0745 02/26/15 0054  AST 115* 52* 47* 46*  ALT 99* 60 63 56  ALKPHOS 62 39 54 60  BILITOT 1.6* 1.1 1.3* 0.8  PROT 6.8 6.1* 6.3* 5.8*  ALBUMIN 3.1* 2.6*  2.7* 2.5*   No results for input(s): LIPASE, AMYLASE in the last 168 hours. No results for input(s): AMMONIA in the last 168 hours. CBC:  Recent Labs Lab 02/22/15 0959 02/23/15 0102 02/24/15 0944 02/25/15 0745 02/26/15 0054  WBC 8.0 7.4 9.2 4.1 6.9  NEUTROABS 5.5  --  6.1 2.9 5.5  HGB 13.2 12.6* 12.1* 12.8* 11.3*  HCT 39.1 38.0* 35.8* 39.0 33.8*  MCV 102.4* 101.3* 103.5* 102.9* 103.0*  PLT 103* 105* 107* 129* 122*   Cardiac Enzymes:  Recent Labs Lab 02/25/15 0136 02/25/15 0745 02/25/15 2000 02/26/15 0054 02/26/15 0743  TROPONINI 0.14* 0.10* 0.09* 0.08* 0.07*   BNP: BNP (last 3 results)  Recent Labs  02/22/15 0959  BNP 808.2*    ProBNP (last 3 results) No results for input(s): PROBNP in the last 8760 hours.  CBG:  Recent Labs Lab 02/25/15 1113 02/25/15 1601 02/25/15 2127 02/26/15 0535 02/26/15 1125  GLUCAP 147* 131* 124* 131* 123*       Signed:  Carolyne Littles, MD [facility] [phone] pager

## 2015-02-26 NOTE — Progress Notes (Signed)
Patient ID: Ananias PilgrimJoseph Chuba, male   DOB: 04/05/1956, 59 y.o.   MRN: [id number] Patient ID: Ananias PilgrimJoseph Cranor, male   DOB: 10/26/1955, 59 y.o.   MRN: [id number]    Subjective:  Positional chest pain  Afib rate not controlled 140-156  Objective:  Vital Signs in the last 24 hours: Temp:  [97.9 F (36.6 C)-98.1 F (36.7 C)] 97.9 F (36.6 C) (11/06 0500) Pulse Rate:  [69-129] 81 (11/06 0500) Resp:  [18] 18 (11/06 0500) BP: (96-114)/(52-84) 105/62 mmHg (11/06 0500) SpO2:  [97 %-99 %] 97 % (11/06 0500) Weight:  [92.171 kg (203 lb 3.2 oz)] 92.171 kg (203 lb 3.2 oz) (11/06 0500)  Intake/Output from previous day: 11/05 0701 - 11/06 0700 In: 600 [P.O.:600] Out: 650 [Urine:650]   Physical Exam: General: Well developed, well nourished, in no acute distress. Head:  Normocephalic and atraumatic. Lungs: Mild wheeze bilat. Heart: Irreg irreg improved rate.  No murmur, rubs or gallops.  Abdomen: soft, non-tender, positive bowel sounds. Extremities: No clubbing or cyanosis. No edema. Neurologic: Alert and oriented x 3.    Lab Results:  Recent Labs  02/25/15 0745 02/26/15 0054  WBC 4.1 6.9  HGB 12.8* 11.3*  PLT 129* 122*    Recent Labs  02/25/15 0745 02/26/15 0054  NA 138 136  K 4.1 4.0  CL 106 106  CO2 25 24  GLUCOSE 140* 163*  BUN 13 19  CREATININE 0.76 0.87    Recent Labs  02/26/15 0054 02/26/15 0743  TROPONINI 0.08* 0.07*   Hepatic Function Panel  Recent Labs  02/26/15 0054  PROT 5.8*  ALBUMIN 2.5*  AST 46*  ALT 56  ALKPHOS 60  BILITOT 0.8   No results for input(s): CHOL in the last 72 hours. No results for input(s): PROTIME in the last 72 hours.  Imaging: No results found. Personally viewed.   Telemetry: AFIB improved rate 140-156 currently.  Personally viewed.     Cardiac Studies:  ECHO normal EF 70%, dilated LA  Scheduled Meds: . antiseptic oral rinse  7 mL Mouth Rinse BID  . clonazePAM  0.5 mg Oral TID  . dabigatran  150 mg Oral Q12H  .  dextromethorphan-guaiFENesin  1 tablet Oral BID  . diltiazem  60 mg Oral QID  . folic acid  1 mg Oral Daily  . furosemide  40 mg Intravenous BID  . insulin aspart  0-15 Units Subcutaneous TID WC  . ipratropium  0.5 mg Nebulization BID  . isosorbide mononitrate  30 mg Oral Daily  . labetalol  300 mg Oral BID  . levalbuterol  0.63 mg Nebulization BID  . methylPREDNISolone (SOLU-MEDROL) injection  60 mg Intravenous Q24H  . multivitamin with minerals  1 tablet Oral Daily  . pantoprazole  40 mg Oral Q2000  . thiamine  100 mg Oral Daily   Continuous Infusions: . sodium chloride Stopped (02/23/15 1900)   PRN Meds:.morphine injection, ondansetron (ZOFRAN) IV  Assessment/Plan:  Principal Problem:   Acute respiratory distress (HCC) Active Problems:   Hypertension   Alcohol abuse   Cocaine abuse   Atrial fibrillation with rapid ventricular response (HCC) - CHADSVASC = 2   Acute CHF (congestive heart failure) (HCC)   Elevated troponin   Respiratory failure (HCC)   Acute congestive heart failure (HCC)   Acute diastolic CHF (congestive heart failure) (HCC)   Atrial fibrillation with RVR (HCC)   Tobacco abuse   Polysubstance abuse   Marijuana abuse   COPD exacerbation (HCC)   Acute respiratory failure  with hypoxia and hypercapnia (HCC)   Hepatitis, viral   59 year old parox AFIB in setting of resp failure/pulm edema/diastolic HF/cocaine  AFIB  On Pradaxa.  Stop LA cardizem  STart q6 and labatolol ( beta blocker with alpha activity ok with cocaine and he has not had it in his system for 6 days) BP is better      Trop mildly elevated, flat, 0.44 peak  - likely demand ischemia in setting of RVR/resp distress  Primary service can consider using toradol for positional chest pain   - has moderate CAD on prior Cath  - continues to use cocaine based on UTOX although he denies knowingly using (thinks X girlfriend may have laced his mariajuana.)  - Continue to promote cessation. No  further cardiac invasive workup at this time given ongoing cocaine use. No ST elevation on ECG, however ST depression with TWI noted precordial leads.    - Will start Imdur 30. Had brief episode of CP.    Respiratory failure/COPD/diastolic HF  - improving but still mild wheeze B.   - one more day of IV lasix  - EF normal  - Creat stable.   ETOH and cocaine cessation  Ok to d/c from our standpoint     Charlton Haws 02/26/2015, 8:51 AM

## 2015-02-26 NOTE — Progress Notes (Signed)
Pt refused 10 am IV Lasix

## 2015-03-02 DIAGNOSIS — R768 Other specified abnormal immunological findings in serum: Secondary | ICD-10-CM | POA: Diagnosis present

## 2015-03-03 ENCOUNTER — Ambulatory Visit: Payer: Medicaid Other | Admitting: Cardiovascular Disease

## 2015-03-03 DIAGNOSIS — R0989 Other specified symptoms and signs involving the circulatory and respiratory systems: Secondary | ICD-10-CM

## 2015-03-07 ENCOUNTER — Inpatient Hospital Stay: Payer: Medicaid Other | Admitting: Internal Medicine

## 2015-03-09 ENCOUNTER — Encounter: Payer: Self-pay | Admitting: Cardiovascular Disease

## 2015-03-23 DIAGNOSIS — E1121 Type 2 diabetes mellitus with diabetic nephropathy: Secondary | ICD-10-CM | POA: Insufficient documentation

## 2015-06-08 ENCOUNTER — Ambulatory Visit (INDEPENDENT_AMBULATORY_CARE_PROVIDER_SITE_OTHER): Payer: Medicaid Other | Admitting: Cardiovascular Disease

## 2015-06-08 ENCOUNTER — Encounter: Payer: Self-pay | Admitting: Cardiovascular Disease

## 2015-06-08 VITALS — BP 160/100 | HR 76 | Ht 71.0 in | Wt 208.0 lb

## 2015-06-08 DIAGNOSIS — I10 Essential (primary) hypertension: Secondary | ICD-10-CM

## 2015-06-08 DIAGNOSIS — I480 Paroxysmal atrial fibrillation: Secondary | ICD-10-CM

## 2015-06-08 MED ORDER — HYDROCHLOROTHIAZIDE 25 MG PO TABS: 25.0000 mg | ORAL_TABLET | Freq: Every day | ORAL | Status: DC

## 2015-06-08 MED ORDER — POTASSIUM CHLORIDE CRYS ER 20 MEQ PO TBCR: 20.0000 meq | EXTENDED_RELEASE_TABLET | Freq: Every day | ORAL | Status: DC

## 2015-06-08 NOTE — Progress Notes (Signed)
Cardiology Office Note   Date:  06/08/2015   ID:  Kevin Orozco, DOB 09/27/1955, MRN [id number]  PCP:  Tomi Bamberger, NP  Cardiologist:   Vesta Mixer, MD   Chief Complaint  Patient presents with  . Atrial Fibrillation  . Hypertension   Problem List 1. Atrial fib with RVR 2. Polysubstance abuse - ETOH, cocaine,  3. Essential HTN 4. Chronic CHF ( likely diastolic ) normal EF.  Mild MR 5.  CAD  6. Hematochezia 7. COPD     History of Present Illness: Feb. 16, 2107:  Kevin Orozco is a 60 y.o. male who presents for evaluation of his atrial fib.   I saw him 2-3 years ago in the hospital.   Since that time, he has been seen by several others on our office.    He is a former [place] patient.  He was recently hospitalized with atrial fibrillation with a rapid ventricular response. His UDS was positive for cocaine. Echocardiogram revealed normal left ventricle systolic function. We were not able to comment on the diastolic dysfunction because of his atrial fibrillation.  He's been having some blood in his stool and also some nosebleeds and so his Pradaxa has been on hold for several days. We discussed colonoscopy - he does not want one .  I told him that he will need one.     Has chronic dyspnea - has known COPD.  Still smoking   Past Medical History  Diagnosis Date  . DM type 2 (diabetes mellitus, type 2) (HCC)   . CAD (coronary artery disease)     a. Cath 2008- nonobstructive, mod LAD, mild LCx & RCA disease b. Cardiolite 2010 normal c. 03/2012 abnormal Lexiscan Myoview attributed to cocaine d. 03/2012 echo: EF 55-60%, moderate LVH, moderate LA dilatation  . HTN (hypertension)   . Hypercholesteremia   . Atrial fibrillation with RVR (HCC) 04/19/12    New onset; spontaneous conversion to NSR; Pradaxa anticoagulation  . Cocaine abuse   . 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 ETOH abuse   . Tobacco abuse   . Atrial fibrillation with rapid ventricular response (HCC) 02/22/2015  . Acute respiratory distress (HCC) 02/22/2015    Past Surgical History  Procedure Laterality Date  . Appendectomy    . Foot surgery       Current Outpatient Prescriptions  Medication Sig Dispense Refill  . aspirin 81 MG tablet Take 81 mg by mouth daily.    . clonazePAM (KLONOPIN) 0.5 MG tablet Take 0.5 mg by mouth 3 (three) times daily.    . dabigatran (PRADAXA) 150 MG CAPS capsule Take 1 capsule (150 mg total) by mouth every 12 (twelve) hours. 60 capsule 0  . diclofenac sodium (VOLTAREN) 1 % GEL Apply 2 g topically 4 (four) times daily.    Marland Kitchen diltiazem (CARDIZEM) 60 MG tablet Take 1 tablet (60 mg total) by mouth 4 (four) times daily. 120 tablet 0  . folic acid (FOLVITE) 1 MG tablet Take 1 tablet (1 mg total) by mouth daily. 30 tablet 0  . HYDROcodone-acetaminophen (NORCO/VICODIN) 5-325 MG tablet Take 1 tablet by mouth every 6 (six) hours as needed for moderate pain.    . isosorbide mononitrate (IMDUR) 30 MG 24 hr tablet Take 1 tablet (30 mg total) by mouth daily. 30 tablet 0  . labetalol (NORMODYNE) 300 MG tablet Take 1  tablet (300 mg total) by mouth 2 (two) times daily. 60 tablet 0  . nitroGLYCERIN (NITROSTAT) 0.4 MG SL tablet Place under the tongue every 5 (five) minutes as needed for chest pain.     Marland Kitchen thiamine 100 MG tablet Take 1 tablet (100 mg total) by mouth daily. 30 tablet 0   No current facility-administered medications for this visit.    Allergies:   Review of patient's allergies indicates no known allergies.    Social History:  The patient  reports that he has been smoking.  He has never used smokeless tobacco. He reports that he drinks about 6.0 oz of alcohol per week. He reports that he uses illicit drugs (Cocaine).   Family History:  The patient's family history includes Diabetes (age of onset: 61) in his mother; Heart attack (age of  onset: 78) in his father.    ROS:  Please see the history of present illness.    Review of Systems: Constitutional:  denies fever, chills, diaphoresis, appetite change and fatigue.  HEENT: denies photophobia, eye pain, redness, hearing loss, ear pain, congestion, sore throat, rhinorrhea, sneezing, neck pain, neck stiffness and tinnitus.  Respiratory: denies SOB, DOE, cough, chest tightness, and wheezing.  Cardiovascular: denies chest pain, palpitations and leg swelling.  Gastrointestinal: denies nausea, vomiting, abdominal pain, diarrhea, constipation, blood in stool.  Genitourinary: denies dysuria, urgency, frequency, hematuria, flank pain and difficulty urinating.  Musculoskeletal: denies  myalgias, back pain, joint swelling, arthralgias and gait problem.   Skin: denies pallor, rash and wound.  Neurological: denies dizziness, seizures, syncope, weakness, light-headedness, numbness and headaches.   Hematological: denies adenopathy, easy bruising, personal or family bleeding history.  Psychiatric/ Behavioral: denies suicidal ideation, mood changes, confusion, nervousness, sleep disturbance and agitation.       All other systems are reviewed and negative.    PHYSICAL EXAM: VS:  There were no vitals taken for this visit. , BMI There is no weight on file to calculate BMI. GEN: Well nourished, well developed, in no acute distress HEENT: normal Neck: no JVD, carotid bruits, or masses Cardiac: RRR; no murmurs, rubs, or gallops,no edema  Respiratory:  clear to auscultation bilaterally, normal work of breathing GI: soft, nontender, nondistended, + BS MS: no deformity or atrophy Skin: warm and dry, no rash Neuro:  Strength and sensation are intact Psych: normal   EKG:  EKG is ordered today. The ekg ordered today demonstrates  NSR at 69.   Occasional PACs    Recent Labs: 02/22/2015: B Natriuretic Peptide 808.2* 02/26/2015: ALT 56; BUN 19; Creatinine, Ser 0.87; Hemoglobin 11.3*;  Magnesium 2.2; Platelets 122*; Potassium 4.0; Sodium 136    Lipid Panel    Component Value Date/Time   CHOL 125 04/20/2012 0500   TRIG 100 04/20/2012 0500   HDL 36* 04/20/2012 0500   CHOLHDL 3.5 04/20/2012 0500   VLDL 20 04/20/2012 0500   LDLCALC 69 04/20/2012 0500      Wt Readings from Last 3 Encounters:  02/26/15 203 lb 3.2 oz (92.171 kg)  01/19/14 210 lb (95.255 kg)  01/18/14 210 lb (95.255 kg)      Other studies Reviewed: Additional studies/ records that were reviewed today include: . Review of the above records demonstrates:    ASSESSMENT AND PLAN:  1.  Paroxysmal atrial fibrillation -  Is now back in NSR. I suspect his PAF was due to his polysubstance abuse. He has been having some GI bleeding.   I will stop the Pradaxa for now. He needs a  GI work up .  2.  Acute congestive heart failure - was likely diastolic but may have been due to the rapid atrial fib.    He is breathing better now that he is in NSR.  3. Polysubstance abuse  4. Essential HTN:     BP is still increased ,  Will start HCTZ 25 a day and kdur 20 meq a day . Check BMP in 3 weeks. Will see him again in 3 months .   5. COPD:  Still smoking ,     Current medicines are reviewed at length with the patient today.  The patient does not have concerns regarding medicines.  The following changes have been made:  no change  Labs/ tests ordered today include:  No orders of the defined types were placed in this encounter.     Disposition:   FU with me in 3 months      Nahser, Deloris Ping, MD  06/08/2015 9:11 AM    [facility] [address] [place], [place], [place]  [address] Phone: [phone]; Fax: [phone]   [facility]  [address] [place], [place]  [address] [phone]   Fax [phone]

## 2015-06-08 NOTE — Patient Instructions (Addendum)
Medication Instructions:  STOP Pradaxa START Kdur (potassium supplement) 20 meq once daily - take with your HCTZ START HCTZ (hydrocholorothiazide) 25 mg once daily - this is a diuretic, take in the morning  Labwork: Your physician recommends that you return for lab work in: 3 weeks for basic metabolic panel   Testing/Procedures: None Ordered   Follow-Up: Your physician recommends that you schedule a follow-up appointment in: 3 months with Dr. Elease Hashimoto.    If you need a refill on your cardiac medications before your next appointment, please call your pharmacy.   Thank you for choosing [facility]! Eligha Bridegroom, RN [phone]

## 2015-06-29 ENCOUNTER — Other Ambulatory Visit (INDEPENDENT_AMBULATORY_CARE_PROVIDER_SITE_OTHER): Payer: Medicaid Other | Admitting: *Deleted

## 2015-06-29 DIAGNOSIS — I10 Essential (primary) hypertension: Secondary | ICD-10-CM | POA: Diagnosis not present

## 2015-06-29 LAB — BASIC METABOLIC PANEL
BUN: 22 mg/dL (ref 7–25)
CHLORIDE: 102 mmol/L (ref 98–110)
CO2: 22 mmol/L (ref 20–31)
CREATININE: 0.82 mg/dL (ref 0.70–1.33)
Calcium: 9.1 mg/dL (ref 8.6–10.3)
GLUCOSE: 93 mg/dL (ref 65–99)
Potassium: 4.3 mmol/L (ref 3.5–5.3)
Sodium: 132 mmol/L — ABNORMAL LOW (ref 135–146)

## 2015-06-29 NOTE — Addendum Note (Signed)
Addended by: Tonita PhoenixBOWDEN, ROBIN K on: 06/29/2015 10:07 AM   Modules accepted: Orders

## 2015-09-07 ENCOUNTER — Ambulatory Visit (INDEPENDENT_AMBULATORY_CARE_PROVIDER_SITE_OTHER): Payer: Medicaid Other | Admitting: Cardiovascular Disease

## 2015-09-07 ENCOUNTER — Encounter: Payer: Self-pay | Admitting: Cardiovascular Disease

## 2015-09-07 VITALS — BP 110/60 | HR 68 | Ht 71.0 in | Wt 220.8 lb

## 2015-09-07 DIAGNOSIS — I480 Paroxysmal atrial fibrillation: Secondary | ICD-10-CM

## 2015-09-07 DIAGNOSIS — I739 Peripheral vascular disease, unspecified: Secondary | ICD-10-CM

## 2015-09-07 DIAGNOSIS — J441 Chronic obstructive pulmonary disease with (acute) exacerbation: Secondary | ICD-10-CM

## 2015-09-07 DIAGNOSIS — I25119 Atherosclerotic heart disease of native coronary artery with unspecified angina pectoris: Secondary | ICD-10-CM | POA: Diagnosis not present

## 2015-09-07 LAB — BASIC METABOLIC PANEL
BUN: 13 mg/dL (ref 7–25)
CALCIUM: 9.3 mg/dL (ref 8.6–10.3)
CHLORIDE: 102 mmol/L (ref 98–110)
CO2: 24 mmol/L (ref 20–31)
CREATININE: 1.14 mg/dL (ref 0.70–1.33)
Glucose, Bld: 93 mg/dL (ref 65–99)
Potassium: 4.7 mmol/L (ref 3.5–5.3)
Sodium: 134 mmol/L — ABNORMAL LOW (ref 135–146)

## 2015-09-07 NOTE — Patient Instructions (Addendum)
Medication Instructions:  Your physician recommends that you continue on your current medications as directed. Please refer to the Current Medication list given to you today.   Labwork: TODAY:  BMET  Testing/Procedures: Your physician has requested that you have a lower extremity arterial exercise duplex. During this test, exercise and ultrasound are used to evaluate arterial blood flow in the legs. Allow one hour for this exam. There are no restrictions or special instructions.   Follow-Up: Your physician wants you to follow-up in: 1 YEAR  WITH Dr. Elease HashimotoNahser. You will receive a reminder letter in the mail two months in advance. If you don't receive a letter, please call our office to schedule the follow-up appointment.   Any Other Special Instructions Will Be Listed Below (If Applicable). Vascular Ultrasound An ultrasound, also called sonography or ultrasonography, uses harmless sound waves to take pictures of the inside of your body. The pictures are taken with a device called a transducer that is held up against your body. The continually changing pictures can be recorded on videotape or film. A vascular ultrasound is a painless test to see if you have blood flow problems or clots in your blood vessels. It may be done to look at blood vessels almost anywhere in the body. There are several types of ultrasounds that can be done to look at the blood vessels. They include:  Continuous wave Doppler ultrasound. This type of ultrasound uses the change in pitch of sound waves to provide information about blood flow through a blood vessel. During the test, a health care provider listens to the sounds produced by the transducer.  Duplex ultrasound. This type of ultrasound uses standard ultrasound methods to produce a picture of a blood vessel and surrounding organs. In addition, a computer provides information about the speed and direction of blood flow through the blood vessel. With this type of  ultrasound it is possible to see the structures inside the body and to evaluate blood flow within those structures at the same time.  Color Doppler ultrasound. This type of ultrasound uses standard ultrasound methods to produce a picture of a blood vessel. In addition, a computer converts the Doppler sounds into colors that are overlaid on the picture of the blood vessel. These colors represent the speed and direction of blood flow through the vessel.  Power Doppler ultrasound. This type of ultrasound is up to five times more sensitive than color Doppler ultrasound. Power Doppler ultrasound can also get pictures that are difficult or impossible to get using standard color Doppler ultrasound. Power Doppler ultrasound is most commonly used to evaluate blood flow through vessels within organs, such as the liver or kidneys.  Transcranial Doppler ultrasound. This type of ultrasound looks at blood flow in blood vessels throughout the brain. It can reveal the presence of narrow arteries, clots blocking the vessels, or malformed blood vessels. RISKS AND COMPLICATIONS There are no known risks or complications of having an ultrasound. BEFORE THE PROCEDURE  If the ultrasound scan involves your upper abdomen, you may be directed not to eat, smoke, or chew gum the morning of your exam. Follow your health care provider's instructions.  During the test, a gel will be applied to your skin. Wear clothing that is easily washable in case the gel gets on your clothes. PROCEDURE  A gel will be applied to your skin. It may feel cool.  The transducer will be placed on the area to be examined.  Pictures will be taken. They will be displayed  on one or more monitors that look like small television screens. AFTER THE PROCEDURE  You can safely drive home and return to regular activities immediately after your exam.  Keep follow-up visits as directed by your health care provider.  Ask when your test results will be  ready. It is your responsibility to get your test results.   This information is not intended to replace advice given to you by your health care provider. Make sure you discuss any questions you have with your health care provider.   Document Released: 04/19/2004 Document Revised: 04/29/2014 Document Reviewed: 07/01/2013 Elsevier Interactive Patient Education [employer]     If you need a refill on your cardiac medications before your next appointment, please call your pharmacy.

## 2015-09-07 NOTE — Progress Notes (Signed)
Cardiology Office Note   Date:  09/07/2015   ID:  Nandan Willems, DOB 1955/07/17, MRN [id number]  PCP:  Tomi Bamberger, NP  Cardiologist:   Kristeen Miss, MD   Chief Complaint  Patient presents with  . Follow-up    Paroxysmal atrial fib   Problem List 1. Atrial fib with RVR 2. Polysubstance abuse - ETOH, cocaine,  3. Essential HTN 4. Chronic CHF ( likely diastolic ) normal EF.  Mild MR 5.  CAD  6. Hematochezia 7. COPD     History of Present Illness: Feb. 16, 2107:  Kevin Orozco is a 60 y.o. male who presents for evaluation of his atrial fib.   I saw him 2-3 years ago in the hospital.   Since that time, he has been seen by several others on our office.    He is a former [place] patient.  He was recently hospitalized with atrial fibrillation with a rapid ventricular response. His UDS was positive for cocaine. Echocardiogram revealed normal left ventricle systolic function. We were not able to comment on the diastolic dysfunction because of his atrial fibrillation.  He's been having some blood in his stool and also some nosebleeds and so his Pradaxa has been on hold for several days. We discussed colonoscopy - he does not want one .  I told him that he will need one.     Has chronic dyspnea - has known COPD.  Still smoking   Sep 06, 2015:  Kevin Orozco is seen today for follow up of his PAF .  Has COPD,  Still smokes. Complains of DOE . Has claudication with walking     Past Medical History  Diagnosis Date  . DM type 2 (diabetes mellitus, type 2) (HCC)   . CAD (coronary artery disease)     a. Cath 2008- nonobstructive, mod LAD, mild LCx & RCA disease b. Cardiolite 2010 normal c. 03/2012 abnormal Lexiscan Myoview attributed to cocaine d. 03/2012 echo: EF 55-60%, moderate LVH, moderate LA dilatation  . HTN (hypertension)   . Hypercholesteremia   . Atrial fibrillation with RVR (HCC) 04/19/12    New onset; spontaneous conversion to NSR; Pradaxa anticoagulation  . Cocaine  abuse   . 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 ETOH abuse   . Tobacco abuse   . Atrial fibrillation with rapid ventricular response (HCC) 02/22/2015  . Acute respiratory distress (HCC) 02/22/2015    Past Surgical History  Procedure Laterality Date  . Appendectomy    . Foot surgery       Current Outpatient Prescriptions  Medication Sig Dispense Refill  . aspirin 81 MG tablet Take 81 mg by mouth daily.    . clonazePAM (KLONOPIN) 0.5 MG tablet Take 0.5 mg by mouth 3 (three) times daily as needed.     . diclofenac sodium (VOLTAREN) 1 % GEL Apply 2 g topically 4 (four) times daily.    Marland Kitchen diltiazem (CARDIZEM) 60 MG tablet Take 1 tablet (60 mg total) by mouth 4 (four) times daily. 120 tablet 0  . hydrochlorothiazide (HYDRODIURIL) 25 MG tablet Take 1 tablet (25 mg total) by mouth daily. 30 tablet 11  . HYDROcodone-acetaminophen (NORCO/VICODIN) 5-325 MG tablet Take 1 tablet by mouth every 6 (six) hours as needed for moderate pain.    . isosorbide mononitrate (IMDUR) 30 MG 24 hr tablet Take 1 tablet (30 mg total) by mouth  daily. 30 tablet 0  . labetalol (NORMODYNE) 300 MG tablet Take 1 tablet (300 mg total) by mouth 2 (two) times daily. 60 tablet 0  . nitroGLYCERIN (NITROSTAT) 0.4 MG SL tablet Place under the tongue every 5 (five) minutes as needed for chest pain.     . potassium chloride SA (K-DUR,KLOR-CON) 20 MEQ tablet Take 1 tablet (20 mEq total) by mouth daily. 30 tablet 11  . thiamine 100 MG tablet Take 1 tablet (100 mg total) by mouth daily. 30 tablet 0  . vitamin B-12 (CYANOCOBALAMIN) 500 MCG tablet Take 500 mcg by mouth daily.     No current facility-administered medications for this visit.    Allergies:   Review of patient's allergies indicates no known allergies.    Social History:  The patient  reports that he has been smoking.  He has never used  smokeless tobacco. He reports that he drinks about 6.0 oz of alcohol per week. He reports that he uses illicit drugs (Cocaine).   Family History:  The patient's family history includes Diabetes (age of onset: 3) in his mother; Heart attack (age of onset: 87) in his father.    ROS:  Please see the history of present illness.    Review of Systems: Constitutional:  denies fever, chills, diaphoresis, appetite change and fatigue.  HEENT: denies photophobia, eye pain, redness, hearing loss, ear pain, congestion, sore throat, rhinorrhea, sneezing, neck pain, neck stiffness and tinnitus.  Respiratory: denies SOB, DOE, cough, chest tightness, and wheezing.  Cardiovascular: denies chest pain, palpitations and leg swelling.  Gastrointestinal: denies nausea, vomiting, abdominal pain, diarrhea, constipation, blood in stool.  Genitourinary: denies dysuria, urgency, frequency, hematuria, flank pain and difficulty urinating.  Musculoskeletal: denies  myalgias, back pain, joint swelling, arthralgias and gait problem.   Skin: denies pallor, rash and wound.  Neurological: denies dizziness, seizures, syncope, weakness, light-headedness, numbness and headaches.   Hematological: denies adenopathy, easy bruising, personal or family bleeding history.  Psychiatric/ Behavioral: denies suicidal ideation, mood changes, confusion, nervousness, sleep disturbance and agitation.       All other systems are reviewed and negative.    PHYSICAL EXAM: VS:  BP 110/60 mmHg  Pulse 68  Ht 5\' 11"  (1.803 m)  Wt 220 lb 12.8 oz (100.154 kg)  BMI 30.81 kg/m2 , BMI Body mass index is 30.81 kg/(m^2). GEN: Well nourished, well developed, in no acute distress HEENT: normal Neck: no JVD, carotid bruits, or masses Cardiac: RRR; no murmurs, rubs, or gallops,no edema  Respiratory:  clear to auscultation bilaterally, normal work of breathing GI: soft, nontender, nondistended, + BS MS: no deformity or atrophy Skin: warm and dry,  no rash Neuro:  Strength and sensation are intact Psych: normal   EKG:  EKG is ordered today. The ekg ordered today demonstrates  NSR at 71.   Occasional PACs    Recent Labs: 02/22/2015: B Natriuretic Peptide 808.2* 02/26/2015: ALT 56; Hemoglobin 11.3*; Magnesium 2.2; Platelets 122* 06/29/2015: BUN 22; Creat 0.82; Potassium 4.3; Sodium 132*    Lipid Panel    Component Value Date/Time   CHOL 125 04/20/2012 0500   TRIG 100 04/20/2012 0500   HDL 36* 04/20/2012 0500   CHOLHDL 3.5 04/20/2012 0500   VLDL 20 04/20/2012 0500   LDLCALC 69 04/20/2012 0500      Wt Readings from Last 3 Encounters:  09/07/15 220 lb 12.8 oz (100.154 kg)  06/08/15 208 lb (94.348 kg)  02/26/15 203 lb 3.2 oz (92.171 kg)      Other  studies Reviewed: Additional studies/ records that were reviewed today include: . Review of the above records demonstrates:    ASSESSMENT AND PLAN:  1.  Paroxysmal atrial fibrillation -  Is now back in NSR. I suspect his PAF was due to his polysubstance abuse. He has been having some GI bleeding.   He needs a GI work up .  2.  Acute congestive heart failure - was likely diastolic but may have been due to the rapid atrial fib.    He is breathing better now that he is in NSR.  3. Polysubstance abuse  4. Essential HTN:     BP is still increased ,  Will start HCTZ 25 a day and kdur 20 meq a day . Check BMP in 3 weeks. Will see him again in 3 months .   5. COPD:  Still smoking ,    6.  Claudication:   Will get ABIs / arterial duplex of lower extremies .  Advised smoking cessation.    Current medicines are reviewed at length with the patient today.  The patient does not have concerns regarding medicines.  The following changes have been made:  no change  Labs/ tests ordered today include:  No orders of the defined types were placed in this encounter.     Disposition:   FU with me in 1 year      Kristeen MissPhilip Nahser, MD  09/07/2015 9:30 AM    [facility] [address], [place], [place]  [address] Phone: [phone]; Fax: [phone]   [facility]  [address] [place], [place]  [address] [phone]   Fax [phone]

## 2015-09-11 ENCOUNTER — Ambulatory Visit (HOSPITAL_COMMUNITY)
Admission: RE | Admit: 2015-09-11 | Discharge: 2015-09-11 | Disposition: A | Discharge status: Home / Self Care | Payer: Medicaid Other | Source: Ambulatory Visit | Attending: Cardiovascular Disease | Admitting: Cardiovascular Disease

## 2015-09-11 DIAGNOSIS — E785 Hyperlipidemia, unspecified: Secondary | ICD-10-CM | POA: Diagnosis not present

## 2015-09-11 DIAGNOSIS — I10 Essential (primary) hypertension: Secondary | ICD-10-CM | POA: Insufficient documentation

## 2015-09-11 DIAGNOSIS — I739 Peripheral vascular disease, unspecified: Secondary | ICD-10-CM

## 2015-09-11 DIAGNOSIS — E119 Type 2 diabetes mellitus without complications: Secondary | ICD-10-CM | POA: Diagnosis not present

## 2015-09-11 DIAGNOSIS — I2510 Atherosclerotic heart disease of native coronary artery without angina pectoris: Secondary | ICD-10-CM | POA: Diagnosis not present

## 2016-07-08 ENCOUNTER — Other Ambulatory Visit: Payer: Self-pay | Admitting: Adult Health

## 2016-07-08 ENCOUNTER — Ambulatory Visit
Admission: RE | Admit: 2016-07-08 | Discharge: 2016-07-08 | Disposition: A | Discharge status: Home / Self Care | Payer: Medicaid Other | Source: Ambulatory Visit | Attending: Adult Health | Admitting: Adult Health

## 2016-07-08 DIAGNOSIS — M545 Low back pain: Secondary | ICD-10-CM

## 2016-07-10 ENCOUNTER — Other Ambulatory Visit (HOSPITAL_COMMUNITY): Payer: Self-pay | Admitting: Nurse Practitioner

## 2016-07-10 DIAGNOSIS — B182 Chronic viral hepatitis C: Secondary | ICD-10-CM

## 2016-07-16 ENCOUNTER — Ambulatory Visit (HOSPITAL_COMMUNITY)
Admission: RE | Admit: 2016-07-16 | Discharge: 2016-07-16 | Disposition: A | Discharge status: Home / Self Care | Payer: Medicaid Other | Source: Ambulatory Visit | Attending: Nurse Practitioner | Admitting: Nurse Practitioner

## 2016-07-16 DIAGNOSIS — N281 Cyst of kidney, acquired: Secondary | ICD-10-CM | POA: Diagnosis not present

## 2016-07-16 DIAGNOSIS — B182 Chronic viral hepatitis C: Secondary | ICD-10-CM | POA: Diagnosis not present

## 2016-08-06 ENCOUNTER — Other Ambulatory Visit: Payer: Self-pay | Admitting: Adult Health

## 2016-08-06 DIAGNOSIS — M545 Low back pain: Secondary | ICD-10-CM

## 2016-08-14 ENCOUNTER — Ambulatory Visit
Admission: RE | Admit: 2016-08-14 | Discharge: 2016-08-14 | Disposition: A | Discharge status: Home / Self Care | Payer: Medicaid Other | Source: Ambulatory Visit | Attending: Adult Health | Admitting: Adult Health

## 2016-08-14 DIAGNOSIS — M545 Low back pain: Secondary | ICD-10-CM

## 2016-09-10 ENCOUNTER — Encounter: Payer: Self-pay | Admitting: Physician Assistant

## 2016-09-10 ENCOUNTER — Ambulatory Visit (INDEPENDENT_AMBULATORY_CARE_PROVIDER_SITE_OTHER): Payer: Medicaid Other | Admitting: Physician Assistant

## 2016-09-10 VITALS — BP 122/60 | HR 52 | Ht 70.5 in | Wt 224.0 lb

## 2016-09-10 DIAGNOSIS — J449 Chronic obstructive pulmonary disease, unspecified: Secondary | ICD-10-CM

## 2016-09-10 DIAGNOSIS — I480 Paroxysmal atrial fibrillation: Secondary | ICD-10-CM

## 2016-09-10 DIAGNOSIS — I5032 Chronic diastolic (congestive) heart failure: Secondary | ICD-10-CM

## 2016-09-10 DIAGNOSIS — R55 Syncope and collapse: Secondary | ICD-10-CM

## 2016-09-10 DIAGNOSIS — I10 Essential (primary) hypertension: Secondary | ICD-10-CM

## 2016-09-10 DIAGNOSIS — I2510 Atherosclerotic heart disease of native coronary artery without angina pectoris: Secondary | ICD-10-CM | POA: Diagnosis not present

## 2016-09-10 DIAGNOSIS — R072 Precordial pain: Secondary | ICD-10-CM

## 2016-09-10 NOTE — Progress Notes (Signed)
Cardiology Office Note:    Date:  09/10/2016   ID:  Kevin Orozco, DOB [birth year], MRN [id number]  PCP:  Marletta Lor, NP  Cardiologist:  Dr. Delane Ginger    Referring MD: Marletta Lor, NP   Chief Complaint  Patient presents with  . Follow-up    AFib, CHF, CAD    History of Present Illness:    Kevin Orozco is a 61 y.o. male with a hx of PAF, nonobstructive CAD by cardiac catheterization in 2008, diastolic CHF, HTN, DM, HL, HCV positive, substance abuse without call and cocaine, COPD. He complained of rectal bleeding and epistaxis in 2/17 and his Pradaxa was placed on hold.  He was last seen by Dr. Elease Hashimoto 5/17. He complained of lower extremity claudication symptoms. ABIs were obtained and these were normal.  CHADS2-VASc=4 (HTN, DM, CAD, CHF).    [name] returns for routine 1 year Cardiology follow up.  He has chronic chest pain and chronic dyspnea on exertion.  Overall he feels it is getting worse.  He uses a motorized wheelchair to get around.  He notes significant degenerative lumbar spine issues.  He tells me that he has chest pain that prompts him to use NTG.  It makes him feel bad.  He states that he will "pass out" while having pain.  This usually happens while sitting in bed.  He denies any recurrent bleeding.  He is dissatisfied with all of his doctors and did complain about the care he has received.    Prior CV studies:   The following studies were reviewed today:  Arterial US/ABIs 09/11/15 No evidence of segmental lower extremity arterial disease at rest, bilaterally. Normal ABI's, bilaterally. Normal great toe-brachial indices, bilaterally.  Echo 02/22/15 Moderate LVH, EF 65-70, normal wall motion, mildly dilated aortic root, mild MR, moderate LAE, LVOT gradient 2.7 m/s likely related to vigorous LVF  Echo 04/21/12 Moderate LVH, EF 60-65, moderate LAE  Myoview 03/2012 Intermediate study; mild ischemia mid septal wall Symptoms occurred in setting of cocaine -  suspected vasospasm; med Rx recommended  LHC 12/2006 LAD ostial 40-50, mid 30 LCx moderate-sized, OM2 30 RCA mid 20 EF 65, mildly dilated aortic root  Past Medical History:  Diagnosis Date  . Acute respiratory distress 02/22/2015  . Atrial fibrillation with RVR (HCC) 04/19/12   New onset; spontaneous conversion to NSR; Pradaxa anticoagulation  . CAD (coronary artery disease)    a. Cath 2008- nonobstructive, mod LAD, mild LCx & RCA disease b. Cardiolite 2010 normal c. 03/2012 abnormal Lexiscan Myoview attributed to cocaine d. 03/2012 echo: EF 55-60%, moderate LVH, moderate LA dilatation  . Cocaine abuse   . DM type 2 (diabetes mellitus, type 2) (HCC)   . ETOH abuse   . 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 HTN (hypertension)   . Hypercholesteremia   . PAF (paroxysmal atrial fibrillation) (HCC) 02/22/2015  . Tobacco abuse     Past Surgical History:  Procedure Laterality Date  . APPENDECTOMY    . FOOT SURGERY      Current Medications: Current Meds  Medication Sig  . aspirin 81 MG tablet Take 81 mg by mouth daily.  . clonazePAM (KLONOPIN) 0.5 MG tablet Take 0.5 mg by mouth 3 (three) times daily as needed.   . diclofenac sodium (VOLTAREN) 1 % GEL Apply 2 g topically 4 (four) times daily.  Marland Kitchen diltiazem (CARDIZEM)  60 MG tablet Take 1 tablet (60 mg total) by mouth 4 (four) times daily.  . hydrochlorothiazide (HYDRODIURIL) 25 MG tablet Take 1 tablet (25 mg total) by mouth daily.  . isosorbide mononitrate (IMDUR) 30 MG 24 hr tablet Take 1 tablet (30 mg total) by mouth daily.  . metoprolol (TOPROL-XL) 200 MG 24 hr tablet take 1 tablet by mouth once daily *DISCONTINUE LABETOLOL  . nitroGLYCERIN (NITROSTAT) 0.4 MG SL tablet Place under the tongue every 5 (five) minutes as needed for chest pain.   Marland Kitchen oxyCODONE-acetaminophen (PERCOCET) 10-325 MG tablet take 1 tablet by  mouth four times a day if needed for pain  . potassium chloride SA (K-DUR,KLOR-CON) 20 MEQ tablet Take 1 tablet (20 mEq total) by mouth daily.  . tamsulosin (FLOMAX) 0.4 MG CAPS capsule Take 0.4 mg by mouth daily.     Allergies:   Patient has no known allergies.   Social History   Social History  . Marital status: Divorced    Spouse name: N/A  . Number of children: N/A  . Years of education: N/A   Occupational History  . unemployed    Social History Main Topics  . Smoking status: Current Every Day Smoker    Packs/day: 3.00    Years: 40.00  . Smokeless tobacco: Never Used  . Alcohol use 6.0 oz/week    12 Standard drinks or equivalent per week     Comment: socially  . Drug use: Yes    Types: Cocaine  . Sexual activity: Not Currently   Other Topics Concern  . None   Social History Narrative   Unemployed.  Lives alone. Divorced.     Family Hx: The patient's family history includes Diabetes (age of onset: 64) in his mother; Heart attack (age of onset: 71) in his father.  ROS:   Please see the history of present illness.    Review of Systems  Cardiovascular: Positive for chest pain, dyspnea on exertion and irregular heartbeat.  Respiratory: Positive for shortness of breath.   Musculoskeletal: Positive for back pain, joint pain and myalgias.  Genitourinary: Positive for incomplete emptying.  Neurological: Positive for dizziness and headaches.  Psychiatric/Behavioral: The patient is nervous/anxious.    All other systems reviewed and are negative.   EKGs/Labs/Other Test Reviewed:    EKG:  EKG is  ordered today.  The ekg ordered today demonstrates sinus brady, HR 52, normal axis, QTc 444 ms, no change from prior tracing.   Recent Labs: No results found for requested labs within last 8760 hours.   Recent Lipid Panel    Component Value Date/Time   CHOL 125 04/20/2012 0500   TRIG 100 04/20/2012 0500   HDL 36 (L) 04/20/2012 0500   CHOLHDL 3.5 04/20/2012 0500   VLDL  20 04/20/2012 0500   LDLCALC 69 04/20/2012 0500     Physical Exam:    VS:  BP 122/60   Pulse (!) 52   Ht 5' 10.5" (1.791 m)   Wt 224 lb (101.6 kg)   BMI 31.69 kg/m     Wt Readings from Last 3 Encounters:  09/10/16 224 lb (101.6 kg)  09/07/15 220 lb 12.8 oz (100.2 kg)  06/08/15 208 lb (94.3 kg)     Physical Exam  Constitutional: He is oriented to person, place, and time. He appears well-developed and well-nourished. No distress.  HENT:  Head: Normocephalic and atraumatic.  Eyes: No scleral icterus.  Neck: Normal range of motion. No JVD (no JVD at 90 degrees) present.  Cardiovascular: Normal rate, regular rhythm, S1 normal and S2 normal.   No murmur heard. Pulmonary/Chest: Effort normal. He has decreased breath sounds. He has no wheezes. He has no rhonchi. He has no rales.  Abdominal: Soft. There is no tenderness.  Musculoskeletal: He exhibits no edema.  Neurological: He is alert and oriented to person, place, and time.  Skin: Skin is warm and dry.  Psychiatric: He has a normal mood and affect.    ASSESSMENT:    1. Precordial pain   2. Syncope, unspecified syncope type   3. PAF (paroxysmal atrial fibrillation) (HCC)   4. Chronic diastolic CHF (congestive heart failure) (HCC)   5. Coronary artery disease involving native coronary artery of native heart without angina pectoris   6. Essential hypertension   7. Chronic obstructive pulmonary disease, unspecified COPD type (HCC)    PLAN:    In order of problems listed above:  1. Precordial pain -  He has typical and atypical chest pain.  He is not that active and rides a motorized chair.  He continues to smoke and has known CAD by prior cath.  I will obtain a Lexiscan Myoview to rule out significant ischemia.   2. Syncope, unspecified syncope type -  He notes syncope assoc with chest pain.  It is not entirely clear that he is experiencing frank syncope.  This seems to be a chronic issue for him.  His symptoms are hard to  figure out.  His ECG is unchanged.    -  Obtain Myoview as noted.    -  Also, get echocardiogram to assess LVF.  3. PAF (paroxysmal atrial fibrillation) (HCC) -  Maintaining NSR.  This seemed to occur in the setting of ETOH and cocaine abuse in the past.  He stopped anticoagulation due to rectal bleeding.  He never had a GI workup.   4. Chronic diastolic CHF (congestive heart failure) (HCC) - Volume appears stable on current Rx. Repeat Echo as noted.   5. Coronary artery disease involving native coronary artery of native heart without angina pectoris - Continue ASA.  No statin due to liver issues.  Obtain Myoview as noted to evaluate his chest pain.   6. Essential hypertension - The patient's blood pressure is controlled on his current regimen.  Continue current therapy.    7. Chronic obstructive pulmonary disease, unspecified COPD type (HCC) - I recommend he quit smoking.  He is not ready to quit.   Dispo:  Return in about 1 year (around 09/10/2017) for Routine Follow Up, w/ Dr. Elease HashimotoNahser.   Medication Adjustments/Labs and Tests Ordered: Current medicines are reviewed at length with the patient today.  Concerns regarding medicines are outlined above.  Orders/Tests:  Orders Placed This Encounter  Procedures  . Myocardial Perfusion Imaging  . EKG 12-Lead  . ECHOCARDIOGRAM COMPLETE   Medication changes: No orders of the defined types were placed in this encounter.  Signed, Tereso NewcomerScott Eriel Doyon, PA-C  09/10/2016 4:40 PM    [facility] [address], [place], [place]  [address] Phone: [phone]; Fax: [phone]

## 2016-09-10 NOTE — Patient Instructions (Signed)
Medication Instructions:  Your physician recommends that you continue on your current medications as directed. Please refer to the Current Medication list given to you today.   Labwork: NONE ORDERED  Testing/Procedures: 1. Your physician has requested that you have a lexiscan myoview. For further information please visit [email or web address] Please follow instruction sheet, as given.  2. Your physician has requested that you have an echocardiogram. Echocardiography is a painless test that uses sound waves to create images of your heart. It provides your doctor with information about the size and shape of your heart and how well your heart's chambers and valves are working. This procedure takes approximately one hour. There are no restrictions for this procedure.    Follow-Up: Your physician wants you to follow-up in: 1 YEAR WITH DR. Elease HashimotoNAHSER. You will receive a reminder letter in the mail two months in advance. If you don't receive a letter, please call our office to schedule the follow-up appointment.   Any Other Special Instructions Will Be Listed Below (If Applicable).     If you need a refill on your cardiac medications before your next appointment, please call your pharmacy.

## 2016-09-23 ENCOUNTER — Telehealth: Payer: Self-pay | Admitting: Physician Assistant

## 2016-09-23 NOTE — Telephone Encounter (Signed)
NotedTereso Newcomer. Janisha Bueso, PA-C    09/23/2016 1:23 PM

## 2016-09-23 NOTE — Telephone Encounter (Signed)
-----   Message from Loni Beckwithynthia W Hasspacher, RN sent at 09/23/2016 12:04 PM EDT ----- Patient was scheduled for a nuclear stress test and an echo on 09/25/16. When patient was contacted to give instructions,patient stated he did not want either of these test. Hasspacher, Adelene Idlerynthia W

## 2016-09-25 ENCOUNTER — Other Ambulatory Visit (HOSPITAL_COMMUNITY): Payer: Medicaid Other

## 2016-09-25 ENCOUNTER — Encounter (HOSPITAL_COMMUNITY): Payer: Medicaid Other

## 2017-07-02 ENCOUNTER — Other Ambulatory Visit: Payer: Self-pay | Admitting: Nurse Practitioner

## 2017-07-02 DIAGNOSIS — K7400 Hepatic fibrosis, unspecified: Secondary | ICD-10-CM

## 2017-07-09 ENCOUNTER — Ambulatory Visit
Admission: RE | Admit: 2017-07-09 | Discharge: 2017-07-09 | Disposition: A | Discharge status: Home / Self Care | Payer: Self-pay | Source: Ambulatory Visit | Attending: Nurse Practitioner | Admitting: Nurse Practitioner

## 2017-07-09 DIAGNOSIS — K7400 Hepatic fibrosis, unspecified: Secondary | ICD-10-CM

## 2017-09-10 ENCOUNTER — Ambulatory Visit: Payer: Medicaid Other | Admitting: Cardiovascular Disease

## 2017-09-10 ENCOUNTER — Encounter: Payer: Self-pay | Admitting: Cardiovascular Disease

## 2017-09-10 ENCOUNTER — Encounter: Payer: Self-pay | Admitting: Nurse Practitioner

## 2017-09-10 VITALS — BP 120/68 | HR 59 | Ht 71.0 in | Wt 205.0 lb

## 2017-09-10 DIAGNOSIS — R079 Chest pain, unspecified: Secondary | ICD-10-CM | POA: Diagnosis not present

## 2017-09-10 DIAGNOSIS — I2510 Atherosclerotic heart disease of native coronary artery without angina pectoris: Secondary | ICD-10-CM

## 2017-09-10 NOTE — Patient Instructions (Addendum)
Medication Instructions:  Your physician has recommended you make the following change in your medication: STOP Diltiazem .    Labwork: None ordered   Testing/Procedures: Your physician has requested that you have a lexiscan myoview. For further information please visit [email or web address] Please follow instruction sheet, as given.    Follow-Up: Your physician wants you to follow-up in: one year with Tereso Newcomer PA or another member of Dr. Harvie Bridge team.  Bonita Quin will receive a reminder letter in the mail two months in advance. If you don't receive a letter, please call our office to schedule the follow-up appointment.   Any Other Special Instructions Will Be Listed Below (If Applicable).     If you need a refill on your cardiac medications before your next appointment, please call your pharmacy.

## 2017-09-10 NOTE — Progress Notes (Signed)
Cardiology Office Note   Date:  09/10/2017   ID:  Kevin Orozco, DOB Nov 19, 1955, MRN [id number]  PCP:  Marletta Lor, NP  Cardiologist:   Kristeen Miss, MD   Chief Complaint  Patient presents with  . Atrial Fibrillation   Problem List 1. Atrial fib with RVR 2. Polysubstance abuse - ETOH, cocaine,  3. Essential HTN 4. Chronic CHF ( likely diastolic ) normal EF.  Mild MR 5.  CAD  6. Hematochezia 7. COPD     History of Present Illness: Feb. 16, 2107:  Kevin Orozco is a 62 y.o. male who presents for evaluation of his atrial fib.   I saw him 2-3 years ago in the hospital.   Since that time, he has been seen by several others on our office.    He is a former [place] patient.  He was recently hospitalized with atrial fibrillation with a rapid ventricular response. His UDS was positive for cocaine. Echocardiogram revealed normal left ventricle systolic function. We were not able to comment on the diastolic dysfunction because of his atrial fibrillation.  He's been having some blood in his stool and also some nosebleeds and so his Pradaxa has been on hold for several days. We discussed colonoscopy - he does not want one .  I told him that he will need one.     Has chronic dyspnea - has known COPD.  Still smoking   Sep 06, 2015:  Kevin Orozco is seen today for follow up of his PAF .  Has COPD,  Still smokes. Complains of DOE . Has claudication with walking   Sep 10, 2017:  Kevin Orozco is seen today for follow-up visit.  He was seen by Tereso Newcomer last year. He has had some epistaxis and Pradaxa was held at that time. He has had some chronic chest pain.  He was set up for a stress test but the patient used to take the stress test.  He was also scheduled to have an echocardiogram but did not get the echocardiogram either.  He has COPD.  He still smokes.  Smokes 1.5 ppd .    Has occasional jaw pain.   Seems to radiated down to his chest  Walks in the house.   Uses an electric wheelchair  when hes out and about   Has a hx of cardiac cath which revealed minimal CAD    Past Medical History:  Diagnosis Date  . Acute respiratory distress 02/22/2015  . Atrial fibrillation with RVR (HCC) 04/19/12   New onset; spontaneous conversion to NSR; Pradaxa anticoagulation  . CAD (coronary artery disease)    a. Cath 2008- nonobstructive, mod LAD, mild LCx & RCA disease b. Cardiolite 2010 normal c. 03/2012 abnormal Lexiscan Myoview attributed to cocaine d. 03/2012 echo: EF 55-60%, moderate LVH, moderate LA dilatation  . Cocaine abuse (HCC)   . DM type 2 (diabetes mellitus, type 2) (HCC)   . ETOH abuse   . 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 HTN (hypertension)   . Hypercholesteremia   . PAF (paroxysmal atrial fibrillation) (HCC) 02/22/2015  . Tobacco abuse     Past Surgical History:  Procedure Laterality Date  . APPENDECTOMY    . FOOT SURGERY       Current Outpatient Medications  Medication Sig Dispense Refill  . aspirin 81 MG tablet Take 81 mg by mouth daily.    Marland Kitchen  clonazePAM (KLONOPIN) 0.5 MG tablet Take 0.5 mg by mouth 3 (three) times daily as needed.     . diclofenac sodium (VOLTAREN) 1 % GEL Apply 2 g topically 4 (four) times daily.    Marland Kitchen diltiazem (CARDIZEM CD) 180 MG 24 hr capsule Take 180 mg by mouth daily.  4  . diltiazem (CARDIZEM) 60 MG tablet Take 1 tablet (60 mg total) by mouth 4 (four) times daily. 120 tablet 0  . hydrochlorothiazide (HYDRODIURIL) 25 MG tablet Take 1 tablet (25 mg total) by mouth daily. 30 tablet 11  . isosorbide mononitrate (IMDUR) 30 MG 24 hr tablet Take 1 tablet (30 mg total) by mouth daily. 30 tablet 0  . metoprolol (TOPROL-XL) 200 MG 24 hr tablet take 1 tablet by mouth once daily *DISCONTINUE LABETOLOL  0  . nitroGLYCERIN (NITROSTAT) 0.4 MG SL tablet Place under the tongue every 5 (five) minutes as needed for chest pain.      Marland Kitchen oxyCODONE-acetaminophen (PERCOCET) 10-325 MG tablet take 1 tablet by mouth four times a day if needed for pain  0  . potassium chloride SA (K-DUR,KLOR-CON) 20 MEQ tablet Take 1 tablet (20 mEq total) by mouth daily. 30 tablet 11  . tamsulosin (FLOMAX) 0.4 MG CAPS capsule Take 0.4 mg by mouth daily.  0   No current facility-administered medications for this visit.     Allergies:   Patient has no known allergies.    Social History:  The patient  reports that he has been smoking.  He has a 120.00 pack-year smoking history. He has never used smokeless tobacco. He reports that he drinks about 6.0 oz of alcohol per week. He reports that he has current or past drug history. Drug: Cocaine.   Family History:  The patient's family history includes Diabetes (age of onset: 38) in his mother; Heart attack (age of onset: 45) in his father.    ROS:  Noted in current history, otherwise review of systems is negative.    Physical Exam: Blood pressure 120/68, pulse (!) 59, height  (1.803 m), weight 205 lb (93 kg), SpO2 98 %.  GEN:  Middle age, chronically ill appearing man, examined in wheelchair  HEENT: Normal NECK: No JVD; No carotid bruits LYMPHATICS: No lymphadenopathy CARDIAC: RRR , no murmurs, rubs, gallops RESPIRATORY:  Clear to auscultation without rales, wheezing or rhonchi  ABDOMEN: Soft, non-tender, non-distended MUSCULOSKELETAL:  No edema; No deformity  SKIN: Warm and dry NEUROLOGIC:  Alert and oriented x 3   EKG: Sep 10, 2017: Sinus bradycardia 59 beats minute.  First degree AV block. Recent Labs: No results found for requested labs within last 8760 hours.    Lipid Panel    Component Value Date/Time   CHOL 125 04/20/2012 0500   TRIG 100 04/20/2012 0500   HDL 36 (L) 04/20/2012 0500   CHOLHDL 3.5 04/20/2012 0500   VLDL 20 04/20/2012 0500   LDLCALC 69 04/20/2012 0500      Wt Readings from Last 3 Encounters:  09/10/17 205 lb (93 kg)  09/10/16 224 lb (101.6 kg)    09/07/15 220 lb 12.8 oz (100.2 kg)      Other studies Reviewed: Additional studies/ records that were reviewed today include: . Review of the above records demonstrates:    ASSESSMENT AND PLAN:  1.  Paroxysmal atrial fibrillation -   Has PAF,  Is maintaining NSR   2.  Acute congestive heart failure -  .no recurrence of PAF   3. Polysubstance abuse  4.  Essential HTN:      BP is well controlled   5.  CP ,  Has occasional jaw pain which radiates to the chest   Has been ordered to have a myoview but he was afraid .  Ive discussed this with him .  He is willing to tak .   5. COPD:    Continues to smoke 1 1/2 ppd .  Advised cessation .   6.  Claudication:      Advised smoking cessation   Current medicines are reviewed at length with the patient today.  The patient does not have concerns regarding medicines.  The following changes have been made:  no change  Labs/ tests ordered today include:  No orders of the defined types were placed in this encounter.    Disposition:   FU with  Tereso Newcomer  in 1 year      Kristeen Miss, MD  09/10/2017 10:44 AM    [facility] [address] [place], [place], [place]  [address] Phone: [phone]; Fax: [phone]   [facility]  [address] [place], [place]  [address] [phone]   Fax [phone]

## 2017-09-18 ENCOUNTER — Telehealth (HOSPITAL_COMMUNITY): Payer: Self-pay | Admitting: *Deleted

## 2017-09-18 NOTE — Telephone Encounter (Signed)
Patient given detailed instructions per Myocardial Perfusion Study Information Sheet for the test on 09/23/17. Patient notified to arrive 15 minutes early and that it is imperative to arrive on time for appointment to keep from having the test rescheduled.  If you need to cancel or reschedule your appointment, please call the office within 24 hours of your appointment. . Patient verbalized understanding. Kevin Orozco     

## 2017-09-19 ENCOUNTER — Encounter (HOSPITAL_COMMUNITY): Payer: Medicaid Other

## 2017-09-23 ENCOUNTER — Encounter (HOSPITAL_COMMUNITY): Payer: Medicaid Other

## 2017-10-09 ENCOUNTER — Telehealth (HOSPITAL_COMMUNITY): Payer: Self-pay | Admitting: Cardiovascular Disease

## 2017-10-09 NOTE — Telephone Encounter (Signed)
09/29/17 lmom for pt to return call to schedule nuclear stress.lm 10/02/17 Called pt and lmsg for him to CB to get r/s for myoview..RG 10/06/17 Called pt and lmsg for him to CB to get r/s for test..RG 10/09/17 Called pt and lmsg for him to CB to get r/s for myoview .Marland Kitchen.RG

## 2018-11-06 ENCOUNTER — Telehealth: Payer: Self-pay | Admitting: Physician Assistant

## 2018-11-06 NOTE — Telephone Encounter (Signed)

## 2018-11-06 NOTE — Telephone Encounter (Signed)
Patient wants to keep virtual appt to keep social distancing with public transportation. Patient stated that's on ly way of transportation and they don't keep it clean.Marland Kitchen

## 2018-11-08 NOTE — Progress Notes (Deleted)
Cardiology Office Note:    Date:  11/08/2018   ID:  Kevin Orozco, DOB 04/26/1955, MRN [id number]  PCP:  [name], NP  Cardiologist:  No primary care provider on file. *** Electrophysiologist:  None   Referring MD: [name], NP   No chief complaint on file. ***  History of Present Illness:    Kevin Orozco is a 63 y.o. male with:  Parox AFib  CHADS2-VASc=4 (HTN, DM, CAD, CHF) >> Pradaxa   Non-obstructive CAD (LHC in 7371)  Diastolic CHF  Hypertension  Diabetes mellitus  Hyperlipidemia   Hep C +  Substance abuse (ETOH, Cocaine)  COPD  Claudication symptoms >> ABIs 2017 normal  Kevin Orozco was last seen by Dr. Acie Fredrickson in 08/2017.  ***  Prior CV studies:   The following studies were reviewed today:  *** Arterial US/ABIs 09/11/15 No evidence of segmental lower extremity arterial disease at rest, bilaterally. Normal ABI's, bilaterally. Normal great toe-brachial indices, bilaterally.  Echo 02/22/15 Moderate LVH, EF 65-70, normal wall motion, mildly dilated aortic root, mild MR, moderate LAE, LVOT gradient 2.7 m/s likely related to vigorous LVF  Echo 04/21/12 Moderate LVH, EF 60-65, moderate LAE  Myoview 03/2012 Intermediate study; mild ischemia mid septal wall Symptoms occurred in setting of cocaine - suspected vasospasm; med Rx recommended  LHC 12/2006 LAD ostial 40-50, mid 30 LCx moderate-sized, OM2 30 RCA mid 20 EF 65, mildly dilated aortic root  Past Medical History:  Diagnosis Date  . Acute respiratory distress 02/22/2015  . Atrial fibrillation with RVR ([place]) 04/19/12   New onset; spontaneous conversion to NSR; Pradaxa anticoagulation  . CAD (coronary artery disease)    a. Cath 2008- nonobstructive, mod LAD, mild LCx & RCA disease b. Cardiolite 2010 normal c. 03/2012 abnormal Lexiscan Myoview attributed to cocaine d. 03/2012 echo: EF 55-60%, moderate LVH, moderate LA dilatation  . Cocaine abuse ([place])   . DM type 2 (diabetes mellitus,  type 2) ([place])   . ETOH abuse   . 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 HTN (hypertension)   . Hypercholesteremia   . PAF (paroxysmal atrial fibrillation) ([place]) 02/22/2015  . Tobacco abuse    Surgical Hx: The patient  has a past surgical history that includes Appendectomy and Foot surgery.   Current Medications: No outpatient medications have been marked as taking for the 11/09/18 encounter (Appointment) with Richardson Dopp T, PA-C.     Allergies:   Patient has no known allergies.   Social History   Tobacco Use  . Smoking status: Current Every Day Smoker    Packs/day: 3.00    Years: 40.00    Pack years: 120.00  . Smokeless tobacco: Never Used  Substance Use Topics  . Alcohol use: Yes    Alcohol/week: 12.0 standard drinks    Types: 12 Standard drinks or equivalent per week    Comment: socially  . Drug use: Yes    Types: Cocaine     Family Hx: The patient's family history includes Diabetes (age of onset: 16) in his mother; Heart attack (age of onset: 47) in his father.  ROS:   Please see the history of present illness.    ROS All other systems reviewed and are negative.   EKGs/Labs/Other Test Reviewed:    EKG:  EKG is *** ordered today.  The ekg ordered today demonstrates ***  Recent Labs: No results found  for requested labs within last 8760 hours.   Recent Lipid Panel Lab Results  Component Value Date/Time   CHOL 125 04/20/2012 05:00 AM   TRIG 100 04/20/2012 05:00 AM   HDL 36 (L) 04/20/2012 05:00 AM   CHOLHDL 3.5 04/20/2012 05:00 AM   LDLCALC 69 04/20/2012 05:00 AM    Physical Exam:    VS:  There were no vitals taken for this visit.    Wt Readings from Last 3 Encounters:  09/10/17 205 lb (93 kg)  09/10/16 224 lb (101.6 kg)  09/07/15 220 lb 12.8 oz (100.2 kg)     ***Physical Exam  ASSESSMENT & PLAN:    *** 1. Precordial  pain -  He has typical and atypical chest pain.  He is not that active and rides a motorized chair.  He continues to smoke and has known CAD by prior cath.  I will obtain a Lexiscan Myoview to rule out significant ischemia.   2. Syncope, unspecified syncope type -  He notes syncope assoc with chest pain.  It is not entirely clear that he is experiencing frank syncope.  This seems to be a chronic issue for him.  His symptoms are hard to figure out.  His ECG is unchanged.               -  Obtain Myoview as noted.               -  Also, get echocardiogram to assess LVF.  3. PAF (paroxysmal atrial fibrillation) (HCC) -  Maintaining NSR.  This seemed to occur in the setting of ETOH and cocaine abuse in the past.  He stopped anticoagulation due to rectal bleeding.  He never had a GI workup.   4. Chronic diastolic CHF (congestive heart failure) (HCC) - Volume appears stable on current Rx. Repeat Echo as noted.   5. Coronary artery disease involving native coronary artery of native heart without angina pectoris - Continue ASA.  No statin due to liver issues.  Obtain Myoview as noted to evaluate his chest pain.   6. Essential hypertension - The patient's blood pressure is controlled on his current regimen.  Continue current therapy.    7. Chronic obstructive pulmonary disease, unspecified COPD type (HCC) - I recommend he quit smoking.  He is not ready to quit.   Dispo:  No follow-ups on file.   Medication Adjustments/Labs and Tests Ordered: Current medicines are reviewed at length with the patient today.  Concerns regarding medicines are outlined above.  Tests Ordered: No orders of the defined types were placed in this encounter.  Medication Changes: No orders of the defined types were placed in this encounter.   Signed, Tereso NewcomerScott Marcine Gadway, PA-C  11/08/2018 4:20 PM    [facility] [address], [place], [place]  [address] Phone: [phone]; Fax: [phone]

## 2018-11-09 ENCOUNTER — Telehealth: Payer: Medicaid Other | Admitting: Physician Assistant

## 2018-11-09 ENCOUNTER — Other Ambulatory Visit: Payer: Self-pay

## 2018-11-09 ENCOUNTER — Telehealth: Payer: Self-pay | Admitting: *Deleted

## 2018-11-09 NOTE — Telephone Encounter (Signed)
Lvm on patient voicemail to reschedule when he will be willing to come in office. Patient had complaints of his shortness of breath and to call PCP for any information that needed which that is not considered having office visit. Patient abruptly hung up phone.

## 2019-07-09 DIAGNOSIS — R079 Chest pain, unspecified: Secondary | ICD-10-CM | POA: Insufficient documentation

## 2019-07-09 NOTE — Progress Notes (Signed)
Patient referred by Marletta Lor, NP for chest pain  Subjective:   Kevin Orozco, male    DOB: [birth year], 64 y.o.   MRN: [id number]   Chief Complaint  Patient presents with  . Chest Pain  . New Patient (Initial Visit)    Hx of Afib, Smoker     HPI  64 y/o male with hypertension, paroxysmal Afib, moderate nonobstructive coronary artery disease (cath 2012), COPD, active smoker, prior h/o polysubstance abuse (EtOH, cocaine), now with increasing chest pain.   Patient previously seen by Dr. Melburn Popper, now wants to establish cardiac care with [name].   Patient has episodes of retrosternal chest pain, jaw and arm numbness, triggered by emotional stress, lasting for few minutes, resolving on its own.  His physical activity is limited due to his back pain.  He walks 100 feet twice a day with his small pet.  Does not have any chest pain during physical activity.  He has shortness of breath with minimal activity, such as tying his shoelaces.  He denies any presyncope, syncope.  He had coronary angiogram in 2012 that showed moderate nonobstructive coronary artery disease.  He has had paroxysmal atrial fibrillation in the past.  He is on 3 antianginal medications.  He does not take nitroglycerin as needed, as it makes him feel "weird".  He is currently not on aspirin, as he states that he cannot afford low-dose aspirin. He denies any recent history of hematochezia, melena.  Patient is unemployed, lives in [place].  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  Since then, he has not been working.  He states that smoking is the only thing he can do during the daytime.  He smokes 2 packs/day.  He has tried Chantix, nicotine, without relief.  He used cocaine in the past, quit in 2018 also.  He also drink 12 pack of beers a day, for 15 years, quit several years ago.    Patient lives alone in a motel.  He has a daughter who lives in [place] who has 3  children with significant health issues.  He does not have anyone to look after him at a motel.  He is very sure that he does not want to undergo any surgery or procedures.  Matter-of-fact, he wants to be DO NOT RESUSCITATE status, states that his father also died at 67 and he feels like his time is coming some.  Past Medical History:  Diagnosis Date  . Acute respiratory distress 02/22/2015  . Atrial fibrillation with RVR ([facility]) 04/19/12   New onset; spontaneous conversion to NSR; Pradaxa anticoagulation  . CAD (coronary artery disease)    a. Cath 2008- nonobstructive, mod LAD, mild LCx & RCA disease b. Cardiolite 2010 normal c. 03/2012 abnormal Lexiscan Myoview attributed to cocaine d. 03/2012 echo: EF 55-60%, moderate LVH, moderate LA dilatation  . Cocaine abuse ([facility])   . DM type 2 (diabetes mellitus, type 2) ([facility])   . ETOH abuse   . 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 HTN (hypertension)   . Hypercholesteremia   . PAF (paroxysmal atrial fibrillation) ([facility]) 02/22/2015  . Tobacco abuse      Past Surgical History:  Procedure Laterality Date  . APPENDECTOMY    . FOOT SURGERY       Social History   Tobacco Use  Smoking Status Current Every Day Smoker  . Packs/day: 3.00  . Years: 40.00  . Pack years: 120.00  Smokeless Tobacco Never Used    Social History   Substance and Sexual Activity  Alcohol Use Yes  . Alcohol/week: 12.0 standard drinks  . Types: 12 Standard drinks or equivalent per week   Comment: socially     Family History  Problem Relation Age of Onset  . Heart attack Father 39  . Diabetes Mother 70     Current Outpatient Medications on File Prior to Visit  Medication Sig Dispense Refill  . aspirin 81 MG tablet Take 81 mg by mouth daily.    . clonazePAM (KLONOPIN) 0.5 MG tablet Take 0.5 mg by mouth 3 (three) times daily as needed.      . diclofenac sodium (VOLTAREN) 1 % GEL Apply 2 g topically 4 (four) times daily.    Marland Kitchen diltiazem (CARDIZEM CD) 180 MG 24 hr capsule Take 180 mg by mouth daily.  4  . hydrochlorothiazide (HYDRODIURIL) 25 MG tablet Take 1 tablet (25 mg total) by mouth daily. 30 tablet 11  . isosorbide mononitrate (IMDUR) 30 MG 24 hr tablet Take 1 tablet (30 mg total) by mouth daily. 30 tablet 0  . metoprolol (TOPROL-XL) 200 MG 24 hr tablet take 1 tablet by mouth once daily *DISCONTINUE LABETOLOL  0  . nitroGLYCERIN (NITROSTAT) 0.4 MG SL tablet Place under the tongue every 5 (five) minutes as needed for chest pain.     Marland Kitchen oxyCODONE-acetaminophen (PERCOCET) 10-325 MG tablet take 1 tablet by mouth four times a day if needed for pain  0  . potassium chloride SA (K-DUR,KLOR-CON) 20 MEQ tablet Take 1 tablet (20 mEq total) by mouth daily. 30 tablet 11  . tamsulosin (FLOMAX) 0.4 MG CAPS capsule Take 0.4 mg by mouth daily.  0   No current facility-administered medications on file prior to visit.    Cardiovascular and other pertinent studies:  EKG 07/12/2019: Atrial fibrillation, controlled ventricular rate 90 bpm. Nonspecific T-abnormality.   Coronary angiography 2012: LM: Normal LAD: Ostial 40-50% stenosis, mid LAD 30% stenosis LCx: OM2 30% stenosis RCA: Mid 20% stenosis and posterior lateral branch  Recent labs: 12/01/2018: H/H 13/38.2. MCV 97. Platelets 157   Review of Systems  Cardiovascular: Positive for chest pain and dyspnea on exertion. Negative for leg swelling, palpitations and syncope.  Respiratory: Positive for shortness of breath.   Gastrointestinal: Negative for hematochezia and melena.  Neurological:       Memory issues  Psychiatric/Behavioral: Positive for depression. Negative for suicidal ideas.         Vitals:   07/12/19 0836  BP: 119/73  Pulse: 69  Resp: 16  Temp: 98 F (36.7 C)  SpO2: 97%     Body mass index is 28.59 kg/m. Filed Weights   07/12/19 0836  Weight: 210 lb  (95.3 kg)     Objective:   Physical Exam  Constitutional: He appears well-developed and well-nourished.  Neck: No JVD present.  Cardiovascular: Normal rate, regular rhythm, normal heart sounds and intact distal pulses.  No murmur heard. Pulmonary/Chest: Effort normal and breath sounds normal. He has no wheezes. He has no rales.  Musculoskeletal:        General: No edema.  Nursing note and vitals reviewed.       Assessment & Recommendations:   64 y/o male with hypertension, paroxysmal Afib, moderate nonobstructive coronary artery disease (cath 2012), COPD, active smoker, prior h/o polysubstance abuse (EtOH, cocaine), now with  increasing chest pain.   Chest pain: Suspicious for angina.  He is currently on metoprolol succinate 200 mg daily, diltiazem 100 mg daily, Imdur 30 mg daily.  He does not want to proceed with any invasive management.  I do not think stress test will change the management in that setting.  We will treat him empirically for angina. I will arrange for Ranexa samples and coupons, and start him on 500 mg twice daily. Emphasized importance of aspirin 81 mg daily, give samples. I will add Xarelto 2.5 mg twice daily for medical management.  Paroxysmal Afib: In rate controlled Afib today CHA2DS2VASc score 1, annual stroke risk 0.6%. Reasonable to forego therapeutic anticoagulation at this point. Instead, I added Xarelto 2.5 mg twice daily for medical management of probable severe CAD. No recent h/o hematochezia. Will obtain baseline echocardiogram and labs  Hypertension: Well-controlled.  Tobacco cessation counseling:  - Currently smoking 2 packs/day   - Patient was informed of the dangers of tobacco abuse including stroke, cancer, and MI, as well as benefits of tobacco cessation. - Patient is NOT willing to quit at this time. - Approximately 5 mins were spent counseling patient cessation techniques. We discussed various methods to help quit smoking, including  deciding on a date to quit, joining a support group, pharmacological agents. - I will reassess his progress at the next follow-up visit  Goals of care: Patient is sure about his wishes, does not want to be resuscitated.  He does not want to undergo any invasive work-up or management.  I do suspect that he has underlying depression.  Defer management to PCP.  That said, he is competent to make his decisions.     Thank you for referring the patient to [name]. Please feel free to contact with any questions.  Elder Negus, MD [facility] Pager: [phone] Office: [phone]

## 2019-07-12 ENCOUNTER — Encounter: Payer: Self-pay | Admitting: Cardiology

## 2019-07-12 ENCOUNTER — Ambulatory Visit: Payer: Medicaid Other | Admitting: Cardiology

## 2019-07-12 ENCOUNTER — Other Ambulatory Visit: Payer: Self-pay

## 2019-07-12 VITALS — BP 119/73 | HR 69 | Temp 98.0°F | Resp 16 | Ht 71.0 in | Wt 210.0 lb

## 2019-07-12 DIAGNOSIS — I25118 Atherosclerotic heart disease of native coronary artery with other forms of angina pectoris: Secondary | ICD-10-CM

## 2019-07-12 DIAGNOSIS — I10 Essential (primary) hypertension: Secondary | ICD-10-CM

## 2019-07-12 DIAGNOSIS — I480 Paroxysmal atrial fibrillation: Secondary | ICD-10-CM

## 2019-07-12 DIAGNOSIS — Z131 Encounter for screening for diabetes mellitus: Secondary | ICD-10-CM

## 2019-07-12 DIAGNOSIS — J449 Chronic obstructive pulmonary disease, unspecified: Secondary | ICD-10-CM

## 2019-07-12 MED ORDER — RANOLAZINE ER 500 MG PO TB12: 500.0000 mg | ORAL_TABLET | Freq: Two times a day (BID) | ORAL | 2 refills | Status: DC

## 2019-07-12 MED ORDER — ASPIRIN EC 81 MG PO TBEC: 81.0000 mg | DELAYED_RELEASE_TABLET | Freq: Every day | ORAL | 3 refills | Status: DC

## 2019-07-12 MED ORDER — XARELTO 2.5 MG PO TABS: 2.5000 mg | ORAL_TABLET | Freq: Two times a day (BID) | ORAL | 2 refills | Status: DC

## 2019-07-19 ENCOUNTER — Other Ambulatory Visit: Payer: Medicaid Other

## 2019-09-10 LAB — HEMOGLOBIN A1C
Est. average glucose Bld gHb Est-mCnc: 108 mg/dL
Hgb A1c MFr Bld: 5.4 % (ref 4.8–5.6)

## 2019-09-11 NOTE — Progress Notes (Signed)
Patient referred by Marletta Lor, NP for chest pain  Subjective:   Kevin Orozco, male    DOB: [birth year], 64 y.o.   MRN: [id number]   Chief Complaint  Patient presents with  . Follow-up    2 month  . Coronary Artery Disease     HPI  64 y.o. male with hypertension, paroxysmal Afib, moderate nonobstructive coronary artery disease (cath 2012), COPD, active smoker, prior h/o polysubstance abuse (EtOH, cocaine), now with increasing chest pain, here for 2 month follow up.   Patient is doing well on current antianginal therapy with improvement in his chest pain. He again reiterates his wishes not to pursue any invasive management and wants to be DNR.   Initial consultation visit HPI 07/11/2019: Patient previously seen by Dr. Melburn Popper, now wants to establish cardiac care with [name].   Patient has episodes of retrosternal chest pain, jaw and arm numbness, triggered by emotional stress, lasting for few minutes, resolving on its own.  His physical activity is limited due to his back pain.  He walks 100 feet twice a day with his small pet.  Does not have any chest pain during physical activity.  He has shortness of breath with minimal activity, such as tying his shoelaces.  He denies any presyncope, syncope.  He had coronary angiogram in 2012 that showed moderate nonobstructive coronary artery disease.  He has had paroxysmal atrial fibrillation in the past.  He is on 3 antianginal medications.  He does not take nitroglycerin as needed, as it makes him feel "weird".  He is currently not on aspirin, as he states that he cannot afford low-dose aspirin. He denies any recent history of hematochezia, melena.  Patient is unemployed, lives in [place].  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  Since then, he has not been working.  He states that smoking is the only thing he can do during the daytime.  He smokes 2 packs/day.  He has tried Chantix, nicotine,  without relief.  He used cocaine in the past, quit in 2018 also.  He also drink 12 pack of beers a day, for 15 years, quit several years ago.    Patient lives alone in a motel.  He has a daughter who lives in [place] who has 3 children with significant health issues.  He does not have anyone to look after him at a motel.  He is very sure that he does not want to undergo any surgery or procedures.  Matter-of-fact, he wants to be DO NOT RESUSCITATE status, states that his father also died at 24 and he feels like his time is coming some.  Current Outpatient Medications on File Prior to Visit  Medication Sig Dispense Refill  . albuterol (VENTOLIN HFA) 108 (90 Base) MCG/ACT inhaler Inhale into the lungs every 6 (six) hours as needed for wheezing or shortness of breath.    Marland Kitchen aspirin EC 81 MG tablet Take 1 tablet (81 mg total) by mouth daily. 90 tablet 3  . clonazePAM (KLONOPIN) 0.5 MG tablet Take 0.5 mg by mouth 3 (three) times daily as needed.     . diclofenac sodium (VOLTAREN) 1 % GEL Apply 2 g topically 4 (four) times daily.    Marland Kitchen diltiazem (CARDIZEM CD) 180 MG 24 hr capsule Take 180 mg by mouth daily.  4  . finasteride (PROSCAR) 5 MG tablet Take 5 mg by mouth daily.    . Fluticasone-Salmeterol (ADVAIR) 250-50 MCG/DOSE  AEPB Inhale 1 puff into the lungs 2 (two) times daily.    Marland Kitchen gabapentin (NEURONTIN) 100 MG capsule Take 100 mg by mouth 3 (three) times daily.    . hydrochlorothiazide (HYDRODIURIL) 25 MG tablet Take 1 tablet (25 mg total) by mouth daily. 30 tablet 11  . isosorbide mononitrate (IMDUR) 30 MG 24 hr tablet Take 1 tablet (30 mg total) by mouth daily. 30 tablet 0  . metoprolol (TOPROL-XL) 200 MG 24 hr tablet take 1 tablet by mouth once daily *DISCONTINUE LABETOLOL  0  . morphine (KADIAN) 30 MG 24 hr capsule Take 30 mg by mouth daily.    . naloxone (NARCAN) nasal spray 4 mg/0.1 mL Place 1 spray into the nose.    . nitroGLYCERIN (NITROSTAT) 0.4 MG SL tablet Place under the tongue  every 5 (five) minutes as needed for chest pain.     Marland Kitchen oxyCODONE-acetaminophen (PERCOCET) 10-325 MG tablet take 1 tablet by mouth four times a day if needed for pain  0  . predniSONE (DELTASONE) 10 MG tablet Take 10 mg by mouth daily with breakfast.    . ranolazine (RANEXA) 500 MG 12 hr tablet Take 1 tablet (500 mg total) by mouth 2 (two) times daily. 60 tablet 2  . rivaroxaban (XARELTO) 2.5 MG TABS tablet Take 1 tablet (2.5 mg total) by mouth 2 (two) times daily. 60 tablet 2  . sertraline (ZOLOFT) 50 MG tablet Take 50 mg by mouth daily.    . tamsulosin (FLOMAX) 0.4 MG CAPS capsule Take 0.4 mg by mouth daily.  0  . traZODone (DESYREL) 100 MG tablet Take 100 mg by mouth at bedtime.    . potassium chloride SA (K-DUR,KLOR-CON) 20 MEQ tablet Take 1 tablet (20 mEq total) by mouth daily. (Patient not taking: Reported on 09/13/2019) 30 tablet 11   No current facility-administered medications on file prior to visit.    Cardiovascular and other pertinent studies: EKG 07/12/2019: Atrial fibrillation, controlled ventricular rate 90 bpm. Nonspecific T-abnormality.   Coronary angiography 2012: LM: Normal LAD: Ostial 40-50% stenosis, mid LAD 30% stenosis LCx: OM2 30% stenosis RCA: Mid 20% stenosis and posterior lateral branch   Recent labs:  09/09/2019: HbA1C 5.4%  12/01/2018: H/H 13/38.2. MCV 97. Platelets 157   Review of Systems  Cardiovascular: Positive for chest pain and dyspnea on exertion. Negative for leg swelling, palpitations and syncope.  Respiratory: Positive for shortness of breath.   Gastrointestinal: Negative for hematochezia and melena.  Neurological:       Memory issues  Psychiatric/Behavioral: Positive for depression. Negative for suicidal ideas.         Vitals:   09/13/19 1027  BP: (!) 123/50  Pulse: 77  Resp: 15  Temp: (!) 97.2 F (36.2 C)  SpO2: 97%     Body mass index is 28.17 kg/m. Filed Weights   09/13/19 1027  Weight: 202 lb (91.6 kg)      Objective:   Physical Exam  Constitutional: He appears well-developed and well-nourished.  Neck: No JVD present.  Cardiovascular: Normal rate, regular rhythm, normal heart sounds and intact distal pulses.  No murmur heard. Pulmonary/Chest: Effort normal and breath sounds normal. He has no wheezes. He has no rales.  Musculoskeletal:        General: No edema.  Nursing note and vitals reviewed.      Assessment & Recommendations:   64 y.o. male with hypertension, paroxysmal Afib, moderate nonobstructive coronary artery disease (cath 2012), COPD, active smoker, prior h/o polysubstance abuse (EtOH, cocaine),  now with increasing chest pain.   Chest pain: Clinically, CAD with angina. Improved on current antianginal therapy. Tolerating Aspirin and low dose Xarelto 2.5 mg twice daily for medical management. Added Crestor 10 mg. Will check lipid panel in 3 months  Paroxysmal Afib: In rate controlled Afib today CHA2DS2VASc score 1, annual stroke risk 0.6%. Reasonable to forego therapeutic anticoagulation at this point. Instead, continue Xarelto 2.5 mg twice daily for medical management of probable severe CAD. No recent h/o hematochezia.  Hypertension: Well-controlled.  Goals of care: Patient is sure about his wishes, does not want to be resuscitated.  He does not want to undergo any invasive work-up or management.  I do suspect that he has underlying depression.  Defer management to PCP.  That said, he is competent to make his decisions.   F/u in 3 months  [place], MD [facility] Pager: [phone] Office: [phone]

## 2019-09-13 ENCOUNTER — Other Ambulatory Visit: Payer: Self-pay

## 2019-09-13 ENCOUNTER — Encounter: Payer: Self-pay | Admitting: Cardiology

## 2019-09-13 ENCOUNTER — Ambulatory Visit: Payer: Medicaid Other | Admitting: Cardiology

## 2019-09-13 VITALS — BP 123/50 | HR 77 | Temp 97.2°F | Resp 15 | Ht 71.0 in | Wt 202.0 lb

## 2019-09-13 DIAGNOSIS — I480 Paroxysmal atrial fibrillation: Secondary | ICD-10-CM

## 2019-09-13 DIAGNOSIS — I25118 Atherosclerotic heart disease of native coronary artery with other forms of angina pectoris: Secondary | ICD-10-CM

## 2019-09-13 MED ORDER — ROSUVASTATIN CALCIUM 10 MG PO TABS: 10.0000 mg | ORAL_TABLET | Freq: Every day | ORAL | 3 refills | Status: DC

## 2019-10-14 ENCOUNTER — Other Ambulatory Visit: Payer: Self-pay | Admitting: Cardiology

## 2019-10-14 DIAGNOSIS — I25118 Atherosclerotic heart disease of native coronary artery with other forms of angina pectoris: Secondary | ICD-10-CM

## 2019-11-12 ENCOUNTER — Other Ambulatory Visit: Payer: Self-pay | Admitting: Cardiology

## 2019-11-12 DIAGNOSIS — I25118 Atherosclerotic heart disease of native coronary artery with other forms of angina pectoris: Secondary | ICD-10-CM

## 2019-11-29 ENCOUNTER — Other Ambulatory Visit: Payer: Self-pay | Admitting: Cardiology

## 2019-11-29 DIAGNOSIS — I25118 Atherosclerotic heart disease of native coronary artery with other forms of angina pectoris: Secondary | ICD-10-CM

## 2019-12-14 ENCOUNTER — Other Ambulatory Visit: Payer: Self-pay

## 2019-12-14 ENCOUNTER — Ambulatory Visit: Payer: Medicaid Other

## 2019-12-14 DIAGNOSIS — I25118 Atherosclerotic heart disease of native coronary artery with other forms of angina pectoris: Secondary | ICD-10-CM

## 2019-12-15 ENCOUNTER — Other Ambulatory Visit: Payer: Self-pay | Admitting: Cardiology

## 2019-12-15 DIAGNOSIS — I25118 Atherosclerotic heart disease of native coronary artery with other forms of angina pectoris: Secondary | ICD-10-CM

## 2019-12-24 ENCOUNTER — Other Ambulatory Visit: Payer: Self-pay

## 2019-12-24 ENCOUNTER — Ambulatory Visit: Payer: Medicaid Other | Admitting: Cardiology

## 2019-12-24 ENCOUNTER — Encounter: Payer: Self-pay | Admitting: Cardiology

## 2019-12-24 VITALS — BP 105/70 | HR 83 | Resp 16 | Ht 71.0 in | Wt 192.2 lb

## 2019-12-24 DIAGNOSIS — I4811 Longstanding persistent atrial fibrillation: Secondary | ICD-10-CM

## 2019-12-24 DIAGNOSIS — I25118 Atherosclerotic heart disease of native coronary artery with other forms of angina pectoris: Secondary | ICD-10-CM

## 2019-12-24 DIAGNOSIS — I10 Essential (primary) hypertension: Secondary | ICD-10-CM

## 2019-12-24 MED ORDER — RIVAROXABAN 20 MG PO TABS: 20.0000 mg | ORAL_TABLET | Freq: Every day | ORAL | 2 refills | Status: DC

## 2019-12-24 NOTE — Progress Notes (Signed)
Patient referred by Marletta Lor, NP for chest pain  Subjective:   Kevin Orozco, male    DOB: 04-22-1956, 64 y.o.   MRN: [id number]   Chief Complaint  Patient presents with  . Coronary Artery Disease  . Follow-up    3 month     HPI  64 y.o. male with hypertension, paroxysmal Afib, moderate nonobstructive coronary artery disease (cath 2012), COPD, active smoker, prior h/o polysubstance abuse (EtOH, cocaine).  Patient is doing well on current antianginal therapy with improvement in his chest pain. He has previously expressed his wishes not to pursue any invasive management and wants to be DNR.   He continues to smoke and does not want to quit.   Initial consultation visit HPI 07/11/2019: Patient previously seen by Dr. Melburn Popper, now wants to establish cardiac care with [name].   Patient has episodes of retrosternal chest pain, jaw and arm numbness, triggered by emotional stress, lasting for few minutes, resolving on its own.  His physical activity is limited due to his back pain.  He walks 100 feet twice a day with his small pet.  Does not have any chest pain during physical activity.  He has shortness of breath with minimal activity, such as tying his shoelaces.  He denies any presyncope, syncope.  He had coronary angiogram in 2012 that showed moderate nonobstructive coronary artery disease.  He has had paroxysmal atrial fibrillation in the past.  He is on 3 antianginal medications.  He does not take nitroglycerin as needed, as it makes him feel "weird".  He is currently not on aspirin, as he states that he cannot afford low-dose aspirin. He denies any recent history of hematochezia, melena.  Patient is unemployed, lives in [place].  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  Since then, he has not been working.  He states that smoking is the only thing he can do during the daytime.  He smokes 2 packs/day.  He has tried Chantix, nicotine,  without relief.  He used cocaine in the past, quit in 2018 also.  He also drink 12 pack of beers a day, for 15 years, quit several years ago.    Patient lives alone in a motel.  He has a daughter who lives in [place] who has 3 children with significant health issues.  He does not have anyone to look after him at a motel.  He is very sure that he does not want to undergo any surgery or procedures.  Matter-of-fact, he wants to be DO NOT RESUSCITATE status, states that his father also died at 31 and he feels like his time is coming some.  Current Outpatient Medications on File Prior to Visit  Medication Sig Dispense Refill  . albuterol (VENTOLIN HFA) 108 (90 Base) MCG/ACT inhaler Inhale into the lungs every 6 (six) hours as needed for wheezing or shortness of breath.    Marland Kitchen aspirin EC 81 MG tablet Take 1 tablet (81 mg total) by mouth daily. 90 tablet 3  . clonazePAM (KLONOPIN) 0.5 MG tablet Take 0.5 mg by mouth 3 (three) times daily as needed.     . diclofenac sodium (VOLTAREN) 1 % GEL Apply 2 g topically 4 (four) times daily.    Marland Kitchen diltiazem (CARDIZEM CD) 180 MG 24 hr capsule Take 180 mg by mouth daily.  4  . finasteride (PROSCAR) 5 MG tablet Take 5 mg by mouth daily.    . Fluticasone-Salmeterol (ADVAIR) 250-50  MCG/DOSE AEPB Inhale 1 puff into the lungs 2 (two) times daily.    Marland Kitchen gabapentin (NEURONTIN) 100 MG capsule Take 100 mg by mouth 3 (three) times daily.    . hydrochlorothiazide (HYDRODIURIL) 25 MG tablet Take 1 tablet (25 mg total) by mouth daily. 30 tablet 11  . isosorbide mononitrate (IMDUR) 30 MG 24 hr tablet Take 1 tablet (30 mg total) by mouth daily. 30 tablet 0  . metoprolol (TOPROL-XL) 200 MG 24 hr tablet take 1 tablet by mouth once daily *DISCONTINUE LABETOLOL  0  . morphine (KADIAN) 30 MG 24 hr capsule Take 30 mg by mouth daily.    . naloxone (NARCAN) nasal spray 4 mg/0.1 mL Place 1 spray into the nose.    . nitroGLYCERIN (NITROSTAT) 0.4 MG SL tablet Place under the tongue  every 5 (five) minutes as needed for chest pain.     Marland Kitchen oxyCODONE-acetaminophen (PERCOCET) 10-325 MG tablet take 1 tablet by mouth four times a day if needed for pain  0  . predniSONE (DELTASONE) 10 MG tablet Take 10 mg by mouth daily with breakfast.    . ranolazine (RANEXA) 500 MG 12 hr tablet TAKE ONE TABLET BY MOUTH TWICE DAILY 60 tablet 0  . rosuvastatin (CRESTOR) 10 MG tablet Take 1 tablet (10 mg total) by mouth daily. 30 tablet 3  . sertraline (ZOLOFT) 50 MG tablet Take 50 mg by mouth daily.    . tamsulosin (FLOMAX) 0.4 MG CAPS capsule Take 0.4 mg by mouth daily.  0  . traZODone (DESYREL) 100 MG tablet Take 100 mg by mouth at bedtime.    Carlena Hurl 2.5 MG TABS tablet TAKE ONE TABLET BY MOUTH TWICE DAILY 60 tablet 2   No current facility-administered medications on file prior to visit.    Cardiovascular and other pertinent studies:  EKG 12/24/2019: Atrial fibrillation 91 bpm  Echocardiogram 12/14/2019:  Left ventricle cavity is normal in size and wall thickness. Mildly  depressed LV systolic function with visual EF 45-50%. Normal global wall  motion. Diastolic function not assessed due to Afib.  Left atrial cavity is moderately dilated.  Moderate (Grade II) mitral regurgitation.  Mild to moderate tricuspid regurgitation. Estimated pulmonary artery  systolic pressure 23 mmHg.  EKG 07/12/2019: Atrial fibrillation, controlled ventricular rate 90 bpm. Nonspecific T-abnormality.   Coronary angiography 2012: LM: Normal LAD: Ostial 40-50% stenosis, mid LAD 30% stenosis LCx: OM2 30% stenosis RCA: Mid 20% stenosis and posterior lateral branch   Recent labs:  09/09/2019: HbA1C 5.4%  12/01/2018: H/H 13/38.2. MCV 97. Platelets 157   Review of Systems  Cardiovascular: Positive for chest pain and dyspnea on exertion. Negative for leg swelling, palpitations and syncope.  Respiratory: Positive for shortness of breath.   Gastrointestinal: Negative for hematochezia and melena.    Neurological:       Memory issues  Psychiatric/Behavioral: Positive for depression. Negative for suicidal ideas.         Vitals:   12/24/19 1035  BP: 105/70  Pulse: 83  Resp: 16  SpO2: 98%     Body mass index is 26.81 kg/m. Filed Weights   12/24/19 1035  Weight: 192 lb 3.2 oz (87.2 kg)     Objective:   Physical Exam Vitals and nursing note reviewed.  Constitutional:      Appearance: He is well-developed.  Neck:     Vascular: No JVD.  Cardiovascular:     Rate and Rhythm: Normal rate and regular rhythm.     Pulses: Intact distal pulses.  Heart sounds: Normal heart sounds. No murmur heard.   Pulmonary:     Effort: Pulmonary effort is normal.     Breath sounds: Normal breath sounds. No wheezing or rales.        Assessment & Recommendations:   64 y.o. male with hypertension, paroxysmal Afib, moderate nonobstructive coronary artery disease (cath 2012), COPD, active smoker, prior h/o polysubstance abuse (EtOH, cocaine), now with increasing chest pain.   Chest pain: Clinically, CAD with angina. Improved on current antianginal therapy.Conitue medical management. Unofrtunately, he does not want to quit smoking.  Paroxysmal Afib: In rate controlled Afib today CHA2DS2VASc score 2, annual stroke risk 2%. Switch low dose Xarelto to 20 mg daily. Not on Aspirin due to ongoing use of Xarelto.   Hypertension: Well-controlled.  Goals of care: Patient is sure about his wishes, does not want to be resuscitated.  He does not want to undergo any invasive work-up or management.  I do suspect that he has underlying depression.  Defer management to PCP.  That said, he is competent to make his decisions.   F/u in 6 months  Azilee Pirro Emiliano Dyer, MD [facility] Pager: [phone] Office: [phone]

## 2020-01-10 ENCOUNTER — Other Ambulatory Visit: Payer: Self-pay | Admitting: Cardiology

## 2020-01-10 DIAGNOSIS — I25118 Atherosclerotic heart disease of native coronary artery with other forms of angina pectoris: Secondary | ICD-10-CM

## 2020-02-02 ENCOUNTER — Other Ambulatory Visit: Payer: Self-pay | Admitting: Cardiology

## 2020-02-02 DIAGNOSIS — I25118 Atherosclerotic heart disease of native coronary artery with other forms of angina pectoris: Secondary | ICD-10-CM

## 2020-02-18 ENCOUNTER — Emergency Department (HOSPITAL_COMMUNITY): Payer: Medicaid Other

## 2020-02-18 ENCOUNTER — Inpatient Hospital Stay (HOSPITAL_COMMUNITY)
Admission: EM | Admit: 2020-02-18 | Discharge: 2020-02-22 | DRG: 291 | Disposition: A | Discharge status: Home / Self Care | Payer: Medicaid Other | Attending: Internal Medicine | Admitting: Internal Medicine

## 2020-02-18 ENCOUNTER — Encounter (HOSPITAL_COMMUNITY): Payer: Self-pay | Admitting: Internal Medicine

## 2020-02-18 DIAGNOSIS — M549 Dorsalgia, unspecified: Secondary | ICD-10-CM | POA: Diagnosis present

## 2020-02-18 DIAGNOSIS — I10 Essential (primary) hypertension: Secondary | ICD-10-CM | POA: Diagnosis not present

## 2020-02-18 DIAGNOSIS — I480 Paroxysmal atrial fibrillation: Secondary | ICD-10-CM | POA: Diagnosis present

## 2020-02-18 DIAGNOSIS — I509 Heart failure, unspecified: Secondary | ICD-10-CM | POA: Insufficient documentation

## 2020-02-18 DIAGNOSIS — Z833 Family history of diabetes mellitus: Secondary | ICD-10-CM | POA: Diagnosis not present

## 2020-02-18 DIAGNOSIS — E119 Type 2 diabetes mellitus without complications: Secondary | ICD-10-CM | POA: Diagnosis present

## 2020-02-18 DIAGNOSIS — F17210 Nicotine dependence, cigarettes, uncomplicated: Secondary | ICD-10-CM | POA: Diagnosis present

## 2020-02-18 DIAGNOSIS — Z7951 Long term (current) use of inhaled steroids: Secondary | ICD-10-CM | POA: Diagnosis not present

## 2020-02-18 DIAGNOSIS — Z20822 Contact with and (suspected) exposure to covid-19: Secondary | ICD-10-CM | POA: Diagnosis present

## 2020-02-18 DIAGNOSIS — J449 Chronic obstructive pulmonary disease, unspecified: Secondary | ICD-10-CM | POA: Diagnosis present

## 2020-02-18 DIAGNOSIS — E785 Hyperlipidemia, unspecified: Secondary | ICD-10-CM | POA: Diagnosis present

## 2020-02-18 DIAGNOSIS — Z79899 Other long term (current) drug therapy: Secondary | ICD-10-CM | POA: Diagnosis not present

## 2020-02-18 DIAGNOSIS — G8929 Other chronic pain: Secondary | ICD-10-CM | POA: Diagnosis present

## 2020-02-18 DIAGNOSIS — I2510 Atherosclerotic heart disease of native coronary artery without angina pectoris: Secondary | ICD-10-CM | POA: Diagnosis not present

## 2020-02-18 DIAGNOSIS — I110 Hypertensive heart disease with heart failure: Principal | ICD-10-CM | POA: Diagnosis present

## 2020-02-18 DIAGNOSIS — I4891 Unspecified atrial fibrillation: Secondary | ICD-10-CM | POA: Diagnosis not present

## 2020-02-18 DIAGNOSIS — I25118 Atherosclerotic heart disease of native coronary artery with other forms of angina pectoris: Secondary | ICD-10-CM | POA: Diagnosis present

## 2020-02-18 DIAGNOSIS — E7800 Pure hypercholesterolemia, unspecified: Secondary | ICD-10-CM | POA: Diagnosis present

## 2020-02-18 DIAGNOSIS — Z8249 Family history of ischemic heart disease and other diseases of the circulatory system: Secondary | ICD-10-CM

## 2020-02-18 DIAGNOSIS — Z79891 Long term (current) use of opiate analgesic: Secondary | ICD-10-CM

## 2020-02-18 DIAGNOSIS — Z7901 Long term (current) use of anticoagulants: Secondary | ICD-10-CM | POA: Diagnosis not present

## 2020-02-18 DIAGNOSIS — I5023 Acute on chronic systolic (congestive) heart failure: Secondary | ICD-10-CM | POA: Diagnosis present

## 2020-02-18 DIAGNOSIS — E876 Hypokalemia: Secondary | ICD-10-CM | POA: Diagnosis present

## 2020-02-18 DIAGNOSIS — D539 Nutritional anemia, unspecified: Secondary | ICD-10-CM | POA: Diagnosis present

## 2020-02-18 LAB — CBC WITH DIFFERENTIAL/PLATELET
Abs Immature Granulocytes: 0.01 10*3/uL (ref 0.00–0.07)
Basophils Absolute: 0 10*3/uL (ref 0.0–0.1)
Basophils Relative: 1 %
Eosinophils Absolute: 0 10*3/uL (ref 0.0–0.5)
Eosinophils Relative: 1 %
HCT: 35.8 % — ABNORMAL LOW (ref 39.0–52.0)
Hemoglobin: 11.6 g/dL — ABNORMAL LOW (ref 13.0–17.0)
Immature Granulocytes: 0 %
Lymphocytes Relative: 44 %
Lymphs Abs: 2.3 10*3/uL (ref 0.7–4.0)
MCH: 32.6 pg (ref 26.0–34.0)
MCHC: 32.4 g/dL (ref 30.0–36.0)
MCV: 100.6 fL — ABNORMAL HIGH (ref 80.0–100.0)
Monocytes Absolute: 0.4 10*3/uL (ref 0.1–1.0)
Monocytes Relative: 7 %
Neutro Abs: 2.5 10*3/uL (ref 1.7–7.7)
Neutrophils Relative %: 47 %
Platelets: 150 10*3/uL (ref 150–400)
RBC: 3.56 MIL/uL — ABNORMAL LOW (ref 4.22–5.81)
RDW: 14 % (ref 11.5–15.5)
WBC: 5.3 10*3/uL (ref 4.0–10.5)
nRBC: 0 % (ref 0.0–0.2)

## 2020-02-18 LAB — BASIC METABOLIC PANEL
Anion gap: 10 (ref 5–15)
BUN: <5 mg/dL — ABNORMAL LOW (ref 8–23)
CO2: 25 mmol/L (ref 22–32)
Calcium: 8.5 mg/dL — ABNORMAL LOW (ref 8.9–10.3)
Chloride: 102 mmol/L (ref 98–111)
Creatinine, Ser: 0.69 mg/dL (ref 0.61–1.24)
GFR, Estimated: >60 mL/min (ref >60)
Glucose, Bld: 112 mg/dL — ABNORMAL HIGH (ref 70–99)
Potassium: 2.8 mmol/L — ABNORMAL LOW (ref 3.5–5.1)
Sodium: 137 mmol/L (ref 135–145)

## 2020-02-18 LAB — BRAIN NATRIURETIC PEPTIDE: B Natriuretic Peptide: 307.4 pg/mL — ABNORMAL HIGH (ref 0.0–100.0)

## 2020-02-18 LAB — TROPONIN I (HIGH SENSITIVITY)
Troponin I (High Sensitivity): 5 ng/L (ref <18)
Troponin I (High Sensitivity): 5 ng/L (ref <18)

## 2020-02-18 LAB — MAGNESIUM: Magnesium: 1.8 mg/dL (ref 1.7–2.4)

## 2020-02-18 LAB — RESPIRATORY PANEL BY RT PCR (FLU A&B, COVID)
Influenza A by PCR: NEGATIVE
Influenza B by PCR: NEGATIVE
SARS Coronavirus 2 by RT PCR: NEGATIVE

## 2020-02-18 MED ORDER — MAGNESIUM SULFATE 2 GM/50ML IV SOLN
2.0000 g | Freq: Once | INTRAVENOUS | Status: AC
  Administered 2020-02-18: 2 g via INTRAVENOUS
  Filled 2020-02-18: qty 50

## 2020-02-18 MED ORDER — MOMETASONE FURO-FORMOTEROL FUM 200-5 MCG/ACT IN AERO
2.0000 | INHALATION_SPRAY | Freq: Two times a day (BID) | RESPIRATORY_TRACT | Status: DC
  Administered 2020-02-19 – 2020-02-22 (×6): 2 via RESPIRATORY_TRACT
  Filled 2020-02-18: qty 8.8

## 2020-02-18 MED ORDER — OXYCODONE-ACETAMINOPHEN 10-325 MG PO TABS: 1.0000 | ORAL_TABLET | Freq: Four times a day (QID) | ORAL | Status: DC | PRN

## 2020-02-18 MED ORDER — OXYCODONE HCL 5 MG PO TABS: 5.0000 mg | ORAL_TABLET | ORAL | Status: DC | PRN

## 2020-02-18 MED ORDER — CLONAZEPAM 0.5 MG PO TABS: 0.5000 mg | ORAL_TABLET | Freq: Two times a day (BID) | ORAL | Status: DC | PRN

## 2020-02-18 MED ORDER — OXYCODONE-ACETAMINOPHEN 5-325 MG PO TABS
1.0000 | ORAL_TABLET | Freq: Once | ORAL | Status: AC
  Administered 2020-02-18: 1 via ORAL
  Filled 2020-02-18: qty 1

## 2020-02-18 MED ORDER — PANTOPRAZOLE SODIUM 40 MG PO TBEC
40.0000 mg | DELAYED_RELEASE_TABLET | Freq: Every day | ORAL | Status: DC
  Administered 2020-02-19 – 2020-02-22 (×4): 40 mg via ORAL
  Filled 2020-02-18 (×4): qty 1

## 2020-02-18 MED ORDER — MORPHINE SULFATE ER 15 MG PO TBCR
30.0000 mg | EXTENDED_RELEASE_TABLET | Freq: Two times a day (BID) | ORAL | Status: DC
  Administered 2020-02-19 – 2020-02-22 (×7): 30 mg via ORAL
  Filled 2020-02-18 (×8): qty 2

## 2020-02-18 MED ORDER — FINASTERIDE 5 MG PO TABS
5.0000 mg | ORAL_TABLET | Freq: Every day | ORAL | Status: DC
  Administered 2020-02-19 – 2020-02-22 (×4): 5 mg via ORAL
  Filled 2020-02-18 (×4): qty 1

## 2020-02-18 MED ORDER — TAMSULOSIN HCL 0.4 MG PO CAPS
0.4000 mg | ORAL_CAPSULE | Freq: Every day | ORAL | Status: DC
  Administered 2020-02-19 – 2020-02-22 (×4): 0.4 mg via ORAL
  Filled 2020-02-18 (×4): qty 1

## 2020-02-18 MED ORDER — POTASSIUM CHLORIDE 10 MEQ/100ML IV SOLN
10.0000 meq | INTRAVENOUS | Status: AC
  Administered 2020-02-18 (×2): 10 meq via INTRAVENOUS
  Filled 2020-02-18 (×2): qty 100

## 2020-02-18 MED ORDER — CLONAZEPAM 0.5 MG PO TABS
1.0000 mg | ORAL_TABLET | Freq: Every day | ORAL | Status: DC
  Administered 2020-02-19 – 2020-02-21 (×4): 1 mg via ORAL
  Filled 2020-02-18 (×4): qty 2

## 2020-02-18 MED ORDER — UMECLIDINIUM BROMIDE 62.5 MCG/INH IN AEPB
1.0000 | INHALATION_SPRAY | Freq: Every day | RESPIRATORY_TRACT | Status: DC
  Administered 2020-02-19 – 2020-02-22 (×3): 1 via RESPIRATORY_TRACT
  Filled 2020-02-18: qty 7

## 2020-02-18 MED ORDER — TIOTROPIUM BROMIDE MONOHYDRATE 18 MCG IN CAPS: 18.0000 ug | ORAL_CAPSULE | Freq: Every day | RESPIRATORY_TRACT | Status: DC

## 2020-02-18 MED ORDER — TRAZODONE HCL 100 MG PO TABS
100.0000 mg | ORAL_TABLET | Freq: Every day | ORAL | Status: DC
  Administered 2020-02-19 – 2020-02-21 (×4): 100 mg via ORAL
  Filled 2020-02-18 (×4): qty 1

## 2020-02-18 MED ORDER — PREDNISONE 10 MG PO TABS
10.0000 mg | ORAL_TABLET | Freq: Every day | ORAL | Status: DC
  Administered 2020-02-19 – 2020-02-22 (×4): 10 mg via ORAL
  Filled 2020-02-18 (×4): qty 1

## 2020-02-18 MED ORDER — ROSUVASTATIN CALCIUM 5 MG PO TABS
10.0000 mg | ORAL_TABLET | Freq: Every day | ORAL | Status: DC
  Administered 2020-02-19 – 2020-02-22 (×4): 10 mg via ORAL
  Filled 2020-02-18 (×4): qty 2

## 2020-02-18 MED ORDER — RANOLAZINE ER 500 MG PO TB12
500.0000 mg | ORAL_TABLET | Freq: Two times a day (BID) | ORAL | Status: DC
  Administered 2020-02-19 – 2020-02-22 (×8): 500 mg via ORAL
  Filled 2020-02-18 (×8): qty 1

## 2020-02-18 MED ORDER — SERTRALINE HCL 50 MG PO TABS
50.0000 mg | ORAL_TABLET | Freq: Every morning | ORAL | Status: DC
  Administered 2020-02-19 – 2020-02-22 (×4): 50 mg via ORAL
  Filled 2020-02-18 (×4): qty 1

## 2020-02-18 MED ORDER — ISOSORBIDE MONONITRATE ER 30 MG PO TB24
30.0000 mg | ORAL_TABLET | Freq: Every day | ORAL | Status: DC
  Administered 2020-02-19 – 2020-02-22 (×4): 30 mg via ORAL
  Filled 2020-02-18 (×4): qty 1

## 2020-02-18 MED ORDER — FUROSEMIDE 10 MG/ML IJ SOLN
40.0000 mg | Freq: Two times a day (BID) | INTRAMUSCULAR | Status: DC
  Administered 2020-02-19 – 2020-02-22 (×7): 40 mg via INTRAVENOUS
  Filled 2020-02-18 (×7): qty 4

## 2020-02-18 MED ORDER — GABAPENTIN 300 MG PO CAPS
300.0000 mg | ORAL_CAPSULE | Freq: Every morning | ORAL | Status: DC
  Administered 2020-02-19 – 2020-02-22 (×4): 300 mg via ORAL
  Filled 2020-02-18 (×4): qty 1

## 2020-02-18 MED ORDER — NITROGLYCERIN 0.4 MG SL SUBL: 0.4000 mg | SUBLINGUAL_TABLET | SUBLINGUAL | Status: DC | PRN

## 2020-02-18 MED ORDER — POTASSIUM CHLORIDE CRYS ER 20 MEQ PO TBCR
20.0000 meq | EXTENDED_RELEASE_TABLET | Freq: Every day | ORAL | Status: DC
  Administered 2020-02-19 – 2020-02-22 (×4): 20 meq via ORAL
  Filled 2020-02-18 (×4): qty 1

## 2020-02-18 MED ORDER — FUROSEMIDE 10 MG/ML IJ SOLN
40.0000 mg | Freq: Once | INTRAMUSCULAR | Status: AC
  Administered 2020-02-18: 40 mg via INTRAVENOUS
  Filled 2020-02-18: qty 4

## 2020-02-18 MED ORDER — POTASSIUM CHLORIDE CRYS ER 20 MEQ PO TBCR
40.0000 meq | EXTENDED_RELEASE_TABLET | Freq: Once | ORAL | Status: AC
  Administered 2020-02-19: 40 meq via ORAL
  Filled 2020-02-18: qty 2

## 2020-02-18 MED ORDER — ACETAMINOPHEN 325 MG PO TABS: 650.0000 mg | ORAL_TABLET | Freq: Four times a day (QID) | ORAL | Status: DC | PRN

## 2020-02-18 MED ORDER — METOPROLOL SUCCINATE ER 100 MG PO TB24
200.0000 mg | ORAL_TABLET | Freq: Every morning | ORAL | Status: DC
  Administered 2020-02-19 – 2020-02-22 (×5): 200 mg via ORAL
  Filled 2020-02-18 (×5): qty 2

## 2020-02-18 MED ORDER — OXYCODONE-ACETAMINOPHEN 5-325 MG PO TABS
1.0000 | ORAL_TABLET | ORAL | Status: DC | PRN
  Administered 2020-02-19 (×2): 1 via ORAL
  Filled 2020-02-18 (×2): qty 1

## 2020-02-18 MED ORDER — SPIRONOLACTONE 25 MG PO TABS
25.0000 mg | ORAL_TABLET | Freq: Every day | ORAL | Status: DC
  Administered 2020-02-18 – 2020-02-22 (×5): 25 mg via ORAL
  Filled 2020-02-18 (×5): qty 1

## 2020-02-18 MED ORDER — RIVAROXABAN 20 MG PO TABS
20.0000 mg | ORAL_TABLET | Freq: Every day | ORAL | Status: DC
  Administered 2020-02-19 – 2020-02-21 (×4): 20 mg via ORAL
  Filled 2020-02-18 (×4): qty 1

## 2020-02-18 MED ORDER — ACETAMINOPHEN 650 MG RE SUPP: 650.0000 mg | Freq: Four times a day (QID) | RECTAL | Status: DC | PRN

## 2020-02-18 MED ORDER — POTASSIUM CHLORIDE CRYS ER 20 MEQ PO TBCR
40.0000 meq | EXTENDED_RELEASE_TABLET | Freq: Once | ORAL | Status: AC
  Administered 2020-02-18: 40 meq via ORAL
  Filled 2020-02-18: qty 2

## 2020-02-18 NOTE — Consult Note (Signed)
CARDIOLOGY CONSULT NOTE  Patient ID: Kevin Orozco MRN: [id number] DOB/AGE: Sep 13, 1955 64 y.o.  Admit date: 02/18/2020 Referring Physician: [facility] Reason for Consultation:  Afib  HPI:   65 y.o. male with hypertension, paroxysmal Afib, moderate nonobstructive coronary artery disease (cath 2012), COPD, active smoker, prior h/o polysubstance abuse (EtOH, cocaine).  Patient had been in rate control even during his prior office visits with me in 06/2019 and 12/2019. However, for last few days, patient has had worsening shortness of breath with minimal exertion and leg edema. He has had occasional chest pain, but is currently chest pain free. In the past, patient has not wanted any invasive management for his CAD. He is convinced that he needs to be shocked out of his Afib.   He admits to eating "whatever I can get", does not restrict his salt intake.     Past Medical History:  Diagnosis Date  . Acute respiratory distress 02/22/2015  . Atrial fibrillation with RVR (HCC) 04/19/12   New onset; spontaneous conversion to NSR; Pradaxa anticoagulation  . CAD (coronary artery disease)    a. Cath 2008- nonobstructive, mod LAD, mild LCx & RCA disease b. Cardiolite 2010 normal c. 03/2012 abnormal Lexiscan Myoview attributed to cocaine d. 03/2012 echo: EF 55-60%, moderate LVH, moderate LA dilatation  . Cocaine abuse (HCC)   . DM type 2 (diabetes mellitus, type 2) (HCC)   . ETOH abuse   . 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 HTN (hypertension)   . Hypercholesteremia   . PAF (paroxysmal atrial fibrillation) (HCC) 02/22/2015  . Tobacco abuse      Past Surgical History:  Procedure Laterality Date  . APPENDECTOMY    . FOOT SURGERY        Family History  Problem Relation Age of Onset  . Heart attack Father 81  . Diabetes Mother 45  . Diabetes Sister   . Heart  attack Sister      Social History: Social History   Socioeconomic History  . Marital status: Divorced    Spouse name: Not on file  . Number of children: 1  . Years of education: Not on file  . Highest education level: Not on file  Occupational History  . Occupation: unemployed  Tobacco Use  . Smoking status: Current Every Day Smoker    Packs/day: 3.00    Years: 40.00    Pack years: 120.00    Types: Cigarettes  . Smokeless tobacco: Never Used  Vaping Use  . Vaping Use: Never used  Substance and Sexual Activity  . Alcohol use: Not Currently    Alcohol/week: 12.0 standard drinks    Types: 12 Standard drinks or equivalent per week  . Drug use: Yes    Types: Cocaine, Hydrocodone, Morphine    Comment: Currently on Hydrocodone and Morphine, not Cocaine  . Sexual activity: Not Currently  Other Topics Concern  . Not on file  Social History Narrative   Unemployed.  Lives alone. Divorced.   Social Determinants of Health   Financial Resource Strain:   . Difficulty of Paying Living Expenses: Not on file  Food Insecurity:   . Worried About Programme researcher, broadcasting/film/video in the Last Year: Not on file  . Ran Out of Food in the Last Year: Not on file  Transportation Needs:   . Lack of Transportation (Medical): Not on file  .  Lack of Transportation (Non-Medical): Not on file  Physical Activity:   . Days of Exercise per Week: Not on file  . Minutes of Exercise per Session: Not on file  Stress:   . Feeling of Stress : Not on file  Social Connections:   . Frequency of Communication with Friends and Family: Not on file  . Frequency of Social Gatherings with Friends and Family: Not on file  . Attends Religious Services: Not on file  . Active Member of Clubs or Organizations: Not on file  . Attends Banker Meetings: Not on file  . Marital Status: Not on file  Intimate Partner Violence:   . Fear of Current or Ex-Partner: Not on file  . Emotionally Abused: Not on file  .  Physically Abused: Not on file  . Sexually Abused: Not on file     (Not in a hospital admission)   Review of Systems  Constitutional: Negative for decreased appetite, malaise/fatigue, weight gain and weight loss.  HENT: Negative for congestion.   Eyes: Negative for visual disturbance.  Cardiovascular: Positive for chest pain, dyspnea on exertion and leg swelling. Negative for palpitations and syncope.  Respiratory: Negative for cough.   Endocrine: Negative for cold intolerance.  Hematologic/Lymphatic: Does not bruise/bleed easily.  Skin: Negative for itching and rash.  Musculoskeletal: Negative for myalgias.  Gastrointestinal: Negative for abdominal pain, nausea and vomiting.  Genitourinary: Negative for dysuria.  Neurological: Negative for dizziness and weakness.  Psychiatric/Behavioral: The patient is not nervous/anxious.   All other systems reviewed and are negative.     Physical Exam: Physical Exam Vitals and nursing note reviewed.  Constitutional:      General: He is not in acute distress.    Appearance: He is well-developed.  HENT:     Head: Normocephalic and atraumatic.  Eyes:     Conjunctiva/sclera: Conjunctivae normal.     Pupils: Pupils are equal, round, and reactive to light.  Neck:     Vascular: JVD present.  Cardiovascular:     Rate and Rhythm: Normal rate. Rhythm irregular.     Pulses: Normal pulses and intact distal pulses.     Heart sounds: No murmur heard.   Pulmonary:     Effort: Pulmonary effort is normal.     Breath sounds: Examination of the right-middle field reveals rales. Examination of the left-middle field reveals rales. Examination of the right-lower field reveals rales. Examination of the left-lower field reveals rales. Rales present. No wheezing.  Abdominal:     General: Bowel sounds are normal.     Palpations: Abdomen is soft.     Tenderness: There is no rebound.  Musculoskeletal:        General: No tenderness. Normal range of motion.       Right lower leg: Edema (2+) present.     Left lower leg: Edema (2+) present.  Lymphadenopathy:     Cervical: No cervical adenopathy.  Skin:    General: Skin is warm and dry.  Neurological:     Mental Status: He is alert and oriented to person, place, and time.     Cranial Nerves: No cranial nerve deficit.      Labs:   Lab Results  Component Value Date   WBC 5.3 02/18/2020   HGB 11.6 (L) 02/18/2020   HCT 35.8 (L) 02/18/2020   MCV 100.6 (H) 02/18/2020   PLT 150 02/18/2020    Recent Labs  Lab 02/18/20 1559  NA 137  K 2.8*  CL 102  CO2 25  BUN <5*  CREATININE 0.69  CALCIUM 8.5*  GLUCOSE 112*    Lipid Panel     Component Value Date/Time   CHOL 125 04/20/2012 0500   TRIG 100 04/20/2012 0500   HDL 36 (L) 04/20/2012 0500   CHOLHDL 3.5 04/20/2012 0500   VLDL 20 04/20/2012 0500   LDLCALC 69 04/20/2012 0500    BNP (last 3 results) Recent Labs    02/18/20 1559  BNP 307.4*    HEMOGLOBIN A1C Lab Results  Component Value Date   HGBA1C 5.4 09/09/2019   MPG 97 02/22/2015    Cardiac Panel (last 3 results) No results for input(s): CKTOTAL, CKMB, RELINDX in the last 8760 hours.  Invalid input(s): TROPONINHS  Lab Results  Component Value Date   CKTOTAL 78 07/07/2010   CKMB 1.5 07/07/2010       Radiology: DG Chest Portable 1 View  Result Date: 02/18/2020 CLINICAL DATA:  Shortness of breath, chest pain. Additional history provided: Chest pain and atrial fibrillation, history of COPD and smoking. EXAM: PORTABLE CHEST 1 VIEW COMPARISON:  Prior chest radiographs 02/23/2015 and earlier. FINDINGS: Cardiomegaly. Central pulmonary vascular congestion. Prominence of the interstitial lung markings likely reflects interstitial edema. Small right greater than left pleural effusions with associated right basilar compressive atelectasis. No evidence of pneumothorax. No acute bony abnormality identified. IMPRESSION: Cardiomegaly with interstitial edema and small right  greater than left pleural effusions. Associated right basilar compressive atelectasis. Electronically Signed   By: Jackey Loge DO   On: 02/18/2020 15:44    Scheduled Meds: . furosemide  40 mg Intravenous Once  . potassium chloride  40 mEq Oral Once   Continuous Infusions: . magnesium sulfate bolus IVPB    . potassium chloride     PRN Meds:.  CARDIAC STUDIES:  EKG 02/18/2020: Afib w/RVR 116 bpm Nonspecific ST-T changes  Echocardiogram 12/14/2019:  Left ventricle cavity is normal in size and wall thickness. Mildly  depressed LV systolic function with visual EF 45-50%. Normal global wall  motion. Diastolic function not assessed due to Afib.  Left atrial cavity is moderately dilated.  Moderate (Grade II) mitral regurgitation.  Mild to moderate tricuspid regurgitation. Estimated pulmonary artery  systolic pressure 23 mmHg.  Coronary angiography 2012: LM: Normal LAD: Ostial 40-50% stenosis, mid LAD 30% stenosis LCx: OM2 30% stenosis RCA: Mid 20% stenosis and posterior lateral branch   Assessment & Recommendations:  64 y.o. male with hypertension, persistent Afib, moderate nonobstructive coronary artery disease (cath 2012), COPD, active smoker, chronic pain, prior h/o polysubstance abuse (EtOH, cocaine), now with acute heart failure, hypokalemia  Acute heart failure with mildly reduced LVEF: Echocardiogram in 11/2019 showed EF 45-50%. Clinically, he is in decompensated heart failure. BNP 307. Recommend IV diuresis with lasix 40 mg bid, along with repletion of K. Spironolactone will be ideal choice of diuretic therapy given his hypokalemia. Start PO 25 mg daily. Strict I/O, daily weights  Afib: Likely persistent. Resting rate not much different compared to baseline. I do not think cardioversion alone would be sufficient to improve his symptoms. He needs diuresis first. After diuresis, if he remains symptomatic, cardioversion can be considered-whether inpatient (on 11/1 or 11/2)  or outpatient. CHA2DS2VASc score 2, annual stroke risk 2%. Continue Xarelto to 20 mg daily. Not on Aspirin due to ongoing use of Xarelto.   CAD: Clinical diagnosis, with stable angina. Continue current antianginal therapy. Unofrtunately, he does not want to quit smoking.  Hypertension: Well-controlled.   Elder Negus, MD Pager: [phone]  Office: [phone]

## 2020-02-18 NOTE — ED Provider Notes (Signed)
[facility] ([facility] EMERGENCY DEPARTMENT Provider Note   CSN: [id number] Arrival date & time: 02/18/20  1451     History Chief Complaint  Patient presents with  . Shortness of Breath    Kevin PilgrimJoseph Orozco is a 64 y.o. male.  HPI   Pt is a [age] y/o male with a h/o afib (on xarelto), cocaine, DM, etoh abuse, HTN, HLD, who presents to the ED today for eval of afib.  States he had felt like he was in A. fib for the last 1 to 2 weeks.  He is also been having exertional shortness of breath and some intermittent chest pain that worsened this morning.  He is also had some bilateral lower extremity swelling for last 1.5 to 2 weeks.Denies pleuritic pain. Denies hemoptysis, recent surgery/trauma, recent long travel, hormone use, personal hx of cancer, or hx of DVT/PE.   Has been compliant with medications but did not take them today.   Past Medical History:  Diagnosis Date  . Acute respiratory distress 02/22/2015  . Atrial fibrillation with RVR (HCC) 04/19/12   New onset; spontaneous conversion to NSR; Pradaxa anticoagulation  . CAD (coronary artery disease)    a. Cath 2008- nonobstructive, mod LAD, mild LCx & RCA disease b. Cardiolite 2010 normal c. 03/2012 abnormal Lexiscan Myoview attributed to cocaine d. 03/2012 echo: EF 55-60%, moderate LVH, moderate LA dilatation  . Cocaine abuse (HCC)   . DM type 2 (diabetes mellitus, type 2) (HCC)   . ETOH abuse   . 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 HTN (hypertension)   . Hypercholesteremia   . PAF (paroxysmal atrial fibrillation) (HCC) 02/22/2015  . Tobacco abuse     Patient Active Problem List   Diagnosis Date Noted  . Chest pain 07/09/2019  . Claudication (HCC) 09/07/2015  . 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HCC)   . 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   .  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 Chronic diastolic CHF (congestive heart failure) (HCC)   . Tobacco abuse   . Polysubstance abuse (HCC)   . Marijuana abuse   . COPD (chronic obstructive pulmonary disease) (HCC)   . Acute respiratory failure with hypoxia and hypercapnia (HCC)   . Hepatitis, viral   . Respiratory failure (HCC) 02/22/2015  . Cocaine abuse (HCC) 04/24/2012  . Coronary vasospasm (HCC) 04/24/2012  . 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  . Transaminitis 04/20/2012  . CAD (coronary artery disease) 04/19/2012  . Hyperlipidemia 04/19/2012  . Paroxysmal A-fib (HCC) 04/19/2012  . Essential hypertension   . Alcohol abuse     Past Surgical History:  Procedure Laterality Date  . APPENDECTOMY    . FOOT SURGERY         Family History  Problem Relation Age of Onset  . Heart attack Father [id number]  . Diabetes Mother 356  . Diabetes Sister   . Heart attack Sister     Social History   Tobacco Use  . Smoking status: Current Every Day Smoker    Packs/day: 3.00    Years: 40.00    Pack years: 120.00    Types: Cigarettes  . Smokeless tobacco: Never Used  Vaping Use  . Vaping Use: Never used  Substance Use Topics  . Alcohol use: Not Currently    Alcohol/week: 12.0  standard drinks    Types: 12 Standard drinks or equivalent per week  . Drug use: Yes    Types: Cocaine, Hydrocodone, Morphine    Comment: Currently on Hydrocodone and Morphine, not Cocaine    Home Medications Prior to Admission medications   Medication Sig Start Date End Date Taking? Authorizing Provider  albuterol (VENTOLIN HFA) 108 (90 Base) MCG/ACT inhaler Inhale into the lungs every 6 (six) hours as needed for wheezing or shortness of breath.    [provider]  clonazePAM (KLONOPIN) 0.5 MG tablet Take 0.5 mg by mouth 3 (three) times daily as needed.     [provider]  diclofenac sodium (VOLTAREN) 1 % GEL Apply 2 g topically 4 (four) times  daily.    [provider]  diltiazem (CARDIZEM CD) 180 MG 24 hr capsule Take 180 mg by mouth daily. 08/26/17   [provider]  finasteride (PROSCAR) 5 MG tablet Take 5 mg by mouth daily.    [provider]  Fluticasone-Salmeterol (ADVAIR) 250-50 MCG/DOSE AEPB Inhale 1 puff into the lungs 2 (two) times daily.    [provider]  gabapentin (NEURONTIN) 100 MG capsule Take 100 mg by mouth 3 (three) times daily.    [provider]  hydrochlorothiazide (HYDRODIURIL) 25 MG tablet Take 1 tablet (25 mg total) by mouth daily. 06/08/15   Nahser, Deloris Ping, MD  isosorbide mononitrate (IMDUR) 30 MG 24 hr tablet Take 1 tablet (30 mg total) by mouth daily. 02/26/15   Drema Dallas, MD  metoprolol (TOPROL-XL) 200 MG 24 hr tablet take 1 tablet by mouth once daily *DISCONTINUE LABETOLOL 08/21/16   [provider]  morphine (KADIAN) 30 MG 24 hr capsule Take 30 mg by mouth daily.    [provider]  naloxone [facility]) nasal spray 4 mg/0.1 mL Place 1 spray into the nose.    [provider]  nitroGLYCERIN (NITROSTAT) 0.4 MG SL tablet Place under the tongue every 5 (five) minutes as needed for chest pain.     [provider]  oxyCODONE-acetaminophen (PERCOCET) 10-325 MG tablet take 1 tablet by mouth four times a day if needed for pain 08/22/16   [provider]  predniSONE (DELTASONE) 10 MG tablet Take 10 mg by mouth daily with breakfast.    [provider]  ranolazine (RANEXA) 500 MG 12 hr tablet TAKE ONE TABLET BY MOUTH TWICE DAILY 02/02/20   [name], Anabel Bene, MD  rivaroxaban (XARELTO) 20 MG TABS tablet Take 1 tablet (20 mg total) by mouth daily with supper. 12/24/19   [name], Anabel Bene, MD  rosuvastatin (CRESTOR) 10 MG tablet Take 1 tablet (10 mg total) by mouth daily. 11/29/19 02/27/20  [name], Anabel Bene, MD  sertraline (ZOLOFT) 50 MG tablet Take 50 mg by mouth daily.    [provider]  tamsulosin (FLOMAX) 0.4  MG CAPS capsule Take 0.4 mg by mouth daily. 08/21/16   [provider]  traZODone (DESYREL) 100 MG tablet Take 100 mg by mouth at bedtime.    [provider]    Allergies    Patient has no known allergies.  Review of Systems   Review of Systems  Constitutional: Negative for chills and fever.  HENT: Negative for ear pain and sore throat.   Eyes: Negative for visual disturbance.  Respiratory: Positive for shortness of breath. Negative for cough.   Cardiovascular: Positive for chest pain, palpitations and leg swelling.  Gastrointestinal: Negative for abdominal pain, constipation, nausea and vomiting.  Genitourinary:  Negative for dysuria and hematuria.  Musculoskeletal: Negative for back pain.  Skin: Negative for color change and rash.  Neurological: Negative for seizures and syncope.  All other systems reviewed and are negative.   Physical Exam Updated Vital Signs BP (!) 145/93   Pulse 97   Temp 98.1 F (36.7 C) (Oral)   Resp (!) 21   Ht 5\' 11"  (1.803 m)   Wt 88.5 kg   SpO2 98%   BMI 27.20 kg/m   Physical Exam Vitals and nursing note reviewed.  Constitutional:      Appearance: He is well-developed.  HENT:     Head: Normocephalic and atraumatic.  Eyes:     Conjunctiva/sclera: Conjunctivae normal.  Cardiovascular:     Heart sounds: Normal heart sounds. No murmur heard.      Comments: Irregularly irregular Pulmonary:     Effort: Pulmonary effort is normal. No respiratory distress.     Breath sounds: Rales present.     Comments: conversationally dyspneic Abdominal:     Palpations: Abdomen is soft.     Tenderness: There is no abdominal tenderness.  Musculoskeletal:     Cervical back: Neck supple.     Right lower leg: No tenderness. Edema present.     Left lower leg: No tenderness. Edema present.  Skin:    General: Skin is warm and dry.  Neurological:     Mental Status: He is alert.     ED Results / Procedures / Treatments   Labs (all labs  ordered are listed, but only abnormal results are displayed) Labs Reviewed  CBC WITH DIFFERENTIAL/PLATELET - Abnormal; Notable for the following components:      Result Value   RBC 3.56 (*)    Hemoglobin 11.6 (*)    HCT 35.8 (*)    MCV 100.6 (*)    All other components within normal limits  BASIC METABOLIC PANEL - Abnormal; Notable for the following components:   Potassium 2.8 (*)    Glucose, Bld 112 (*)    BUN <5 (*)    Calcium 8.5 (*)    All other components within normal limits  BRAIN NATRIURETIC PEPTIDE - Abnormal; Notable for the following components:   B Natriuretic Peptide 307.4 (*)    All other components within normal limits  RESPIRATORY PANEL BY RT PCR (FLU A&B, COVID)  MAGNESIUM  TROPONIN I (HIGH SENSITIVITY)  TROPONIN I (HIGH SENSITIVITY)    EKG EKG Interpretation  Date/Time:  Friday February 18 2020 15:56:13 EDT Ventricular Rate:  116 PR Interval:    QRS Duration: 98 QT Interval:  390 QTC Calculation: 542 R Axis:   61 Text Interpretation: Atrial fibrillation Ventricular premature complex Borderline repolarization abnormality Prolonged QT interval agree, similar to previous except less lateral ST depression as compared to previous Confirmed by [id number] [phone]) on 02/18/2020 5:32:37 PM   Radiology DG Chest Portable 1 View  Result Date: 02/18/2020 CLINICAL DATA:  Shortness of breath, chest pain. Additional history provided: Chest pain and atrial fibrillation, history of COPD and smoking. EXAM: PORTABLE CHEST 1 VIEW COMPARISON:  Prior chest radiographs 02/23/2015 and earlier. FINDINGS: Cardiomegaly. Central pulmonary vascular congestion. Prominence of the interstitial lung markings likely reflects interstitial edema. Small right greater than left pleural effusions with associated right basilar compressive atelectasis. No evidence of pneumothorax. No acute bony abnormality identified. IMPRESSION: Cardiomegaly with interstitial edema and small right greater than  left pleural effusions. Associated right basilar compressive atelectasis. Electronically Signed   By: 13/06/2014 DO  On: 02/18/2020 15:44    Procedures Procedures (including critical care time)  CRITICAL CARE Performed by: Karrie Meres   Total critical care time: 33 minutes  Critical care time was exclusive of separately billable procedures and treating other patients.  Critical care was necessary to treat or prevent imminent or life-threatening deterioration.  Critical care was time spent personally by me on the following activities: development of treatment plan with patient and/or surrogate as well as nursing, discussions with consultants, evaluation of patient's response to treatment, examination of patient, obtaining history from patient or surrogate, ordering and performing treatments and interventions, ordering and review of laboratory studies, ordering and review of radiographic studies, pulse oximetry and re-evaluation of patient's condition.   Medications Ordered in ED Medications  potassium chloride 10 mEq in 100 mL IVPB ( Intravenous Stopped 02/18/20 1900)  oxyCODONE-acetaminophen (PERCOCET/ROXICET) 5-325 MG per tablet 1 tablet (has no administration in time range)  furosemide (LASIX) injection 40 mg (has no administration in time range)  spironolactone (ALDACTONE) tablet 25 mg (has no administration in time range)  potassium chloride SA (KLOR-CON) CR tablet 40 mEq (40 mEq Oral Given 02/18/20 1745)  magnesium sulfate IVPB 2 g 50 mL ( Intravenous Stopped 02/18/20 1859)  furosemide (LASIX) injection 40 mg (40 mg Intravenous Given 02/18/20 1746)    ED Course  I have reviewed the triage vital signs and the nursing notes.  Pertinent labs & imaging results that were available during my care of the patient were reviewed by me and considered in my medical decision making (see chart for details).    MDM Rules/Calculators/A&P                          64 year old male  presenting to the emergency department today for evaluation of chest pain, shortness of breath and A. fib.  Reviewed/interpreted CBC is leukocytosis, mild anemia which appears stable BMP with low potassium at 2.8, otherwise reassuring Magnesium is within normal limits Troponin negative BNP is elevated at 307  EKG with afib, PVCs, repolarization abnormality, and prolonged QT interval  CXR reviewed/interpreted - Cardiomegaly with interstitial edema and small right greater than left pleural effusions. Associated right basilar compressive Atelectasis.  5:24 PM CONSULT with Dr. Rosemary Holms with cardiology.  He recommends admission for diuresis and potassium repletion.  He will see the patient in the ED.  6:21 PM Dr. Lollie Marrow evaluated at bedside. He does not see any urgent need for cardioversion at this time.  He recommends hospitalist admit.  MDM: Patient with atrial fibrillation, intermittent 4 episodes of A. fib with RVR here in the ED.  Fluid overloaded on exam failure exacerbation.  Not currently requiring oxygen.  Also with hyperkalemia.  Patient was given oral and IV potassium, magnesium and Lasix in the emergency department.  Will plan for admission to hospitalist service.  7:05 PM CONSULT with Dr. Toniann Fail who accepts patient for admission.   Final Clinical Impression(s) / ED Diagnoses Final diagnoses:  Acute on chronic congestive heart failure, unspecified heart failure type (HCC)  Hypokalemia    Rx / DC Orders ED Discharge Orders    None       Rayne Du 02/18/20 1907    Maia Plan, MD 02/25/20 1001

## 2020-02-18 NOTE — ED Notes (Signed)
Cardiology at bedside.

## 2020-02-18 NOTE — ED Notes (Signed)
Pt reports the shob that he is experiencing today feels more like when he has A-fib vs COPD exacerbation.

## 2020-02-18 NOTE — ED Triage Notes (Addendum)
Pt bib [facility] w/ c/o sob x 2 weeks that has worsened over the past day. Pt endorses chest pain w/ exertion. SPO2 94% RA w/ EMS. Pt has hx of COPD, AFIB, DM, HTN. Resp e/u at rest.

## 2020-02-18 NOTE — H&P (Signed)
History and Physical    Kevin Orozco MOQ:[id number] DOB: July 14, 1955 DOA: 02/18/2020  PCP: Marletta Lor, NP  Patient coming from: Home.  Chief Complaint: Shortness of breath.  HPI: Kevin Orozco is a 64 y.o. male with history of systolic CHF, A. fib, COPD macrocytic anemia presents to the ER because of increasing shortness of breath over the last 1 week.  Shortness of breath happens mostly on exertion.  Has been having at times chest pressure retrosternal.  Also noted increasing bilateral lower extremity edema.  Denies any fever chills or productive cough.  ED Course: In the ER chest x-ray shows bilateral pleural effusion labs are significant for potassium of 2.8 BNP of 307 hemoglobin 9.6 with a EKG showing A. fib rate around 116 bpm.  Patient's cardiologist Dr. Rosemary Holms was consulted.  And patient was started on Lasix 40 mg IV every 12 along with spironolactone added.  Since potassium is low aggressive potassium replacement also has been started.  Review of Systems: As per HPI, rest all negative.   Past Medical History:  Diagnosis Date  . Acute respiratory distress 02/22/2015  . Atrial fibrillation with RVR (HCC) 04/19/12   New onset; spontaneous conversion to NSR; Pradaxa anticoagulation  . CAD (coronary artery disease)    a. Cath 2008- nonobstructive, mod LAD, mild LCx & RCA disease b. Cardiolite 2010 normal c. 03/2012 abnormal Lexiscan Myoview attributed to cocaine d. 03/2012 echo: EF 55-60%, moderate LVH, moderate LA dilatation  . Cocaine abuse (HCC)   . DM type 2 (diabetes mellitus, type 2) (HCC)   . ETOH abuse   . 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 HTN (hypertension)   . Hypercholesteremia   . PAF (paroxysmal atrial fibrillation) (HCC) 02/22/2015  . Tobacco abuse     Past Surgical History:  Procedure Laterality Date  . APPENDECTOMY    . FOOT  SURGERY       reports that he has been smoking cigarettes. He has a 120.00 pack-year smoking history. He has never used smokeless tobacco. He reports previous alcohol use of about 12.0 standard drinks of alcohol per week. He reports current drug use. Drugs: Cocaine, Hydrocodone, and Morphine.  No Known Allergies  Family History  Problem Relation Age of Onset  . Heart attack Father 6  . Diabetes Mother 11  . Diabetes Sister   . Heart attack Sister     Prior to Admission medications   Medication Sig Start Date End Date Taking? Authorizing Provider  clonazePAM (KLONOPIN) 0.5 MG tablet Take 0.5 mg by mouth See admin instructions. Take 1 mg by mouth at bedtime and an additional 0.5 mg two tmes a day as needed for anxiety   Yes [provider]  diclofenac sodium (VOLTAREN) 1 % GEL Apply 2 g topically 4 (four) times daily as needed (for pain).    Yes [provider]  finasteride (PROSCAR) 5 MG tablet Take 5 mg by mouth daily.   Yes [provider]  Fluticasone-Salmeterol (ADVAIR) 250-50 MCG/DOSE AEPB Inhale 1 puff into the lungs 2 (two) times daily.   Yes [provider]  gabapentin (NEURONTIN) 300 MG capsule Take 300 mg by mouth in the morning.   Yes [provider]  hydrochlorothiazide (HYDRODIURIL) 25 MG tablet Take 1 tablet (25 mg total) by mouth daily. 06/08/15  Yes Nahser, Deloris Ping, MD  ipratropium-albuterol (DUONEB) 0.5-2.5 (3) MG/3ML SOLN  Take 3 mLs by nebulization daily as needed (for shortness of breath or wheezing).   Yes [provider]  isosorbide mononitrate (IMDUR) 30 MG 24 hr tablet Take 1 tablet (30 mg total) by mouth daily. 02/26/15  Yes Drema Dallas, MD  metoprolol (TOPROL-XL) 200 MG 24 hr tablet Take 200 mg by mouth in the morning.  08/21/16  Yes [provider]  morphine (MS CONTIN) 30 MG 12 hr tablet Take 30 mg by mouth See admin instructions. Take 30 mg by mouth at 8 AM and 8 PM   Yes [provider]    naloxone (NARCAN) nasal spray 4 mg/0.1 mL Place 1 spray into the nose once as needed (AS DIRECTED).    Yes [provider]  nitroGLYCERIN (NITROSTAT) 0.4 MG SL tablet Place under the tongue every 5 (five) minutes as needed for chest pain.    Yes [provider]  oxyCODONE-acetaminophen (PERCOCET) 10-325 MG tablet Take 1 tablet by mouth 4 (four) times daily as needed for pain.  08/22/16  Yes [provider]  pantoprazole (PROTONIX) 40 MG tablet Take 40 mg by mouth daily before breakfast.   Yes [provider]  predniSONE (DELTASONE) 10 MG tablet Take 10 mg by mouth daily with breakfast.   Yes [provider]  PROAIR HFA 108 (90 Base) MCG/ACT inhaler Inhale 2 puffs into the lungs every 6 (six) hours as needed for wheezing or shortness of breath.   Yes [provider]  ranolazine (RANEXA) 500 MG 12 hr tablet TAKE ONE TABLET BY MOUTH TWICE DAILY Patient taking differently: Take 500 mg by mouth 2 (two) times daily.  02/02/20  Yes [name], Anabel Bene, MD  rivaroxaban (XARELTO) 20 MG TABS tablet Take 1 tablet (20 mg total) by mouth daily with supper. 12/24/19  Yes [name], [name], MD  rosuvastatin (CRESTOR) 10 MG tablet Take 1 tablet (10 mg total) by mouth daily. 11/29/19 02/27/20 Yes [name], [name], MD  sertraline (ZOLOFT) 50 MG tablet Take 50 mg by mouth in the morning.    Yes [provider]  SPIRIVA HANDIHALER 18 MCG inhalation capsule Place 18 mcg into inhaler and inhale daily.   Yes [provider]  tamsulosin (FLOMAX) 0.4 MG CAPS capsule Take 0.4 mg by mouth daily. 08/21/16  Yes [provider]  traZODone (DESYREL) 100 MG tablet Take 100 mg by mouth at bedtime.   Yes [provider]  diltiazem (CARDIZEM CD) 180 MG 24 hr capsule Take 180 mg by mouth daily. 08/26/17   [provider]    Physical Exam: Constitutional: Moderately built and nourished. Vitals:   02/18/20 2030 02/18/20 2100 02/18/20  2130 02/18/20 2223  BP: (!) 145/92 (!) 138/91 (!) 142/91 (!) 146/94  Pulse: (!) 59 91 97 86  Resp: 14 (!) 22 (!) 21 19  Temp:    98.1 F (36.7 C)  TempSrc:    Oral  SpO2: 92% 93% 96% 97%  Weight:      Height:       Eyes: Anicteric no pallor. ENMT: No discharge from the ears eyes nose or mouth. Neck: JVD elevated no mass felt. Respiratory: No rhonchi or crepitations. Cardiovascular: S1-S2 heard. Abdomen: Soft nontender bowel sounds present. Musculoskeletal: Bilateral lower extremity edema present. Skin: No rash. Neurologic: Alert awake oriented to time place and person.  Moves all extremities. Psychiatric: Appears normal.  Normal affect.   Labs on Admission: I have personally reviewed following labs and imaging studies  CBC: Recent Labs  Lab 02/18/20 1559  WBC 5.3  NEUTROABS 2.5  HGB 11.6*  HCT 35.8*  MCV 100.6*  PLT 150   Basic Metabolic Panel: Recent Labs  Lab 02/18/20 1559  NA 137  K 2.8*  CL 102  CO2 25  GLUCOSE 112*  BUN <5*  CREATININE 0.69  CALCIUM 8.5*  MG 1.8   GFR: Estimated Creatinine Clearance: 103.3 mL/min (by C-G formula based on SCr of 0.69 mg/dL). Liver Function Tests: No results for input(s): AST, ALT, ALKPHOS, BILITOT, PROT, ALBUMIN in the last 168 hours. No results for input(s): LIPASE, AMYLASE in the last 168 hours. No results for input(s): AMMONIA in the last 168 hours. Coagulation Profile: No results for input(s): INR, PROTIME in the last 168 hours. Cardiac Enzymes: No results for input(s): CKTOTAL, CKMB, CKMBINDEX, TROPONINI in the last 168 hours. BNP (last 3 results) No results for input(s): PROBNP in the last 8760 hours. HbA1C: No results for input(s): HGBA1C in the last 72 hours. CBG: No results for input(s): GLUCAP in the last 168 hours. Lipid Profile: No results for input(s): CHOL, HDL, LDLCALC, TRIG, CHOLHDL, LDLDIRECT in the last 72 hours. Thyroid Function Tests: No results for input(s): TSH, T4TOTAL, FREET4, T3FREE,  THYROIDAB in the last 72 hours. Anemia Panel: No results for input(s): VITAMINB12, FOLATE, FERRITIN, TIBC, IRON, RETICCTPCT in the last 72 hours. Urine analysis: No results found for: COLORURINE, APPEARANCEUR, LABSPEC, PHURINE, GLUCOSEU, HGBUR, BILIRUBINUR, KETONESUR, PROTEINUR, UROBILINOGEN, NITRITE, LEUKOCYTESUR Sepsis Labs: @LABRCNTIP (procalcitonin:4,lacticidven:4) ) Recent Results (from the past 240 hour(s))  Respiratory Panel by RT PCR (Flu A&B, Covid) - Nasopharyngeal Swab     Status: None   Collection Time: 02/18/20  7:24 PM   Specimen: Nasopharyngeal Swab  Result Value Ref Range Status   SARS Coronavirus 2 by RT PCR NEGATIVE NEGATIVE Final    Comment: (NOTE) SARS-CoV-2 target nucleic acids are NOT DETECTED.  The SARS-CoV-2 RNA is generally detectable in upper respiratoy specimens during the acute phase of infection. The lowest concentration of SARS-CoV-2 viral copies this assay can detect is 131 copies/mL. A negative result does not preclude SARS-Cov-2 infection and should not be used as the sole basis for treatment or other patient management decisions. A negative result may occur with  improper specimen collection/handling, submission of specimen other than nasopharyngeal swab, presence of viral mutation(s) within the areas targeted by this assay, and inadequate number of viral copies (<131 copies/mL). A negative result must be combined with clinical observations, patient history, and epidemiological information. The expected result is Negative.  Fact Sheet for Patients:  02/20/20  Fact Sheet for Healthcare Providers:  https://www.moore.com/  This test is no t yet approved or cleared by the https://www.young.biz/ FDA and  has been authorized for detection and/or diagnosis of SARS-CoV-2 by FDA under an Emergency Use Authorization (EUA). This EUA will remain  in effect (meaning this test can be used) for the duration of  the COVID-19 declaration under Section 564(b)(1) of the Act, 21 U.S.C. section 360bbb-3(b)(1), unless the authorization is terminated or revoked sooner.     Influenza A by PCR NEGATIVE NEGATIVE Final   Influenza B by PCR NEGATIVE NEGATIVE Final    Comment: (NOTE) The Xpert Xpress SARS-CoV-2/FLU/RSV assay is intended as an aid in  the diagnosis of influenza from Nasopharyngeal swab specimens and  should not be used as a sole basis for treatment. Nasal washings and  aspirates are unacceptable for Xpert Xpress SARS-CoV-2/FLU/RSV  testing.  Fact Sheet for Patients: [place]  Fact Sheet for Healthcare Providers:  https://www.young.biz/https://www.fda.gov/media/142435/download  This test is not yet approved or cleared by the [place] FDA and  has been authorized for detection and/or diagnosis of SARS-CoV-2 by  FDA under an Emergency Use Authorization (EUA). This EUA will remain  in effect (meaning this test can be used) for the duration of the  Covid-19 declaration under Section 564(b)(1) of the Act, 21  U.S.C. section 360bbb-3(b)(1), unless the authorization is  terminated or revoked. Performed at [facility], [address] [place], [place] [address]      Radiological Exams on Admission: DG Chest Portable 1 View  Result Date: 02/18/2020 CLINICAL DATA:  Shortness of breath, chest pain. Additional history provided: Chest pain and atrial fibrillation, history of COPD and smoking. EXAM: PORTABLE CHEST 1 VIEW COMPARISON:  Prior chest radiographs 02/23/2015 and earlier. FINDINGS: Cardiomegaly. Central pulmonary vascular congestion. Prominence of the interstitial lung markings likely reflects interstitial edema. Small right greater than left pleural effusions with associated right basilar compressive atelectasis. No evidence of pneumothorax. No acute bony abnormality identified. IMPRESSION: Cardiomegaly with interstitial edema and small right greater than left pleural  effusions. Associated right basilar compressive atelectasis. Electronically Signed   By: Jackey LogeKyle  Golden DO   On: 02/18/2020 15:44    EKG: Independently reviewed.  A. fib with RVR.  Assessment/Plan Principal Problem:   Acute on chronic systolic CHF (congestive heart failure) (HCC) Active Problems:   Essential hypertension   Hypokalemia   Atrial fibrillation with RVR (HCC)   CHF (congestive heart failure) (HCC)    1. Acute on chronic systolic heart failure last EF measured was 45 to 50% per cardiology notes.  Patient is placed on Lasix 40 mg IV every 12 and spironolactone has been added.  Patient is already on metoprolol and Imdur.  May need to add ACE inhibitor or ARB will discuss with cardiologist. 2. A. fib with RVR appreciate cardiology consult.  Patient has not taken his metoprolol which we will dose.  Patient is on Xarelto for anticoagulation. 3. Hypokalemia -replace and recheck.  Hold hydrochlorothiazide until patient's potassium is corrected.  Patient is already on Lasix.  Check magnesium with next blood draw. 4. History of nonobstructive CAD -trend cardiac markers patient is on Xarelto statins beta-blockers. 5. Macrocytic anemia check anemia panel with next blood draw. 6. COPD not actively wheezing. 7. Chronic pain on morphine.   Since patient has acute decompensated CHF with A. fib with RVR will need close monitoring for any further worsening in inpatient status.  DVT prophylaxis: Xarelto. Code Status: Full code. Family Communication: Discussed with patient. Disposition Plan: Home. Consults called: Cardiologist. Admission status: Inpatient.   Eduard ClosArshad N Hawken Bielby MD [facility] Pager [phone].  If 7PM-7AM, please contact night-coverage [email or web address] Password [email or web address]  02/18/2020, 10:49 PM

## 2020-02-19 DIAGNOSIS — E876 Hypokalemia: Secondary | ICD-10-CM

## 2020-02-19 DIAGNOSIS — I2510 Atherosclerotic heart disease of native coronary artery without angina pectoris: Secondary | ICD-10-CM

## 2020-02-19 DIAGNOSIS — I10 Essential (primary) hypertension: Secondary | ICD-10-CM

## 2020-02-19 LAB — BASIC METABOLIC PANEL
Anion gap: 9 (ref 5–15)
BUN: 6 mg/dL — ABNORMAL LOW (ref 8–23)
CO2: 26 mmol/L (ref 22–32)
Calcium: 8.9 mg/dL (ref 8.9–10.3)
Chloride: 104 mmol/L (ref 98–111)
Creatinine, Ser: 0.82 mg/dL (ref 0.61–1.24)
GFR, Estimated: >60 mL/min (ref >60)
Glucose, Bld: 117 mg/dL — ABNORMAL HIGH (ref 70–99)
Potassium: 4.1 mmol/L (ref 3.5–5.1)
Sodium: 139 mmol/L (ref 135–145)

## 2020-02-19 LAB — HIV ANTIBODY (ROUTINE TESTING W REFLEX): HIV Screen 4th Generation wRfx: NONREACTIVE

## 2020-02-19 LAB — BRAIN NATRIURETIC PEPTIDE: B Natriuretic Peptide: 426.5 pg/mL — ABNORMAL HIGH (ref 0.0–100.0)

## 2020-02-19 LAB — TROPONIN I (HIGH SENSITIVITY)
Troponin I (High Sensitivity): 6 ng/L (ref <18)
Troponin I (High Sensitivity): 6 ng/L (ref <18)

## 2020-02-19 LAB — MAGNESIUM: Magnesium: 2.1 mg/dL (ref 1.7–2.4)

## 2020-02-19 MED ORDER — MAGNESIUM SULFATE 2 GM/50ML IV SOLN
2.0000 g | INTRAVENOUS | Status: AC
  Administered 2020-02-19: 2 g via INTRAVENOUS
  Filled 2020-02-19: qty 50

## 2020-02-19 MED ORDER — OXYCODONE-ACETAMINOPHEN 10-325 MG PO TABS: 1.0000 | ORAL_TABLET | Freq: Four times a day (QID) | ORAL | Status: DC | PRN

## 2020-02-19 MED ORDER — OXYCODONE HCL 5 MG PO TABS
5.0000 mg | ORAL_TABLET | Freq: Four times a day (QID) | ORAL | Status: DC | PRN
  Administered 2020-02-19 – 2020-02-22 (×8): 5 mg via ORAL
  Filled 2020-02-19 (×8): qty 1

## 2020-02-19 MED ORDER — OXYCODONE-ACETAMINOPHEN 5-325 MG PO TABS
1.0000 | ORAL_TABLET | Freq: Four times a day (QID) | ORAL | Status: DC | PRN
  Administered 2020-02-19 – 2020-02-22 (×10): 1 via ORAL
  Filled 2020-02-19 (×10): qty 1

## 2020-02-19 NOTE — Progress Notes (Signed)
- PROGRESS NOTE    Kevin Orozco   URK:[id number]  DOB: 18-Jun-1955  DOA: 02/18/2020 PCP: Marletta Lor, NP   Brief Narrative:  Kevin Orozco is a 64 y.o. male with history of systolic CHF, A. fib, COPD macrocytic anemia presents to the ER because of increasing shortness of breath over the last 1 week with pedal edema.  Shortness of breath happens mostly on exertion.  Has been having at times chest pressure retrosternal.    In ED > CXR showing pulmonary edema   Subjective: Feels a little less short of breat but still has dyspnea on exertion.     Assessment & Plan:   Principal Problem:   Acute on chronic systolic CHF (congestive heart failure)  Essential HTN - per cardiology note, ECHO from 8/21 > EF 40-45% - BNP 307> 426 today - crackles at bases and right leg edema today - cont IV Lasix and Spironolactone - pulse ox 89 % on room air today - cont Metoprolol and Imdur - add ARB prior to d/c     Active Problems:   Hypokalemia - K 2.8-  - has been replaced and is normal now   Atrial fibrillation with RVR (HCC) - HR in low 100s today- he feels symptomatic/ tachycardic - cardiology considering cardioversion after diuresis - on Metoprolol and Xarelto  Stable Angina -cont Imdur  Chronic back  pain - I have resumed  Oxycodone per his home doses - cont home dose of MS contin  Time spent in minutes: 35 DVT prophylaxis:  rivaroxaban (XARELTO) tablet 20 mg  Code Status: full code Family Communication:  Disposition Plan:  Status is: Inpatient  Remains inpatient appropriate because:A-fib with RVR and pulmonary edema   Dispo: The patient is from: Home              Anticipated d/c is to: Home              Anticipated d/c date is: > 3 days              Patient currently is not medically stable to d/c.      Consultants:   cardiology Procedures:   none Antimicrobials:  Anti-infectives (From admission, onward)   None       Objective: Vitals:   02/19/20  0448 02/19/20 0700 02/19/20 0859 02/19/20 1304  BP: 118/77  120/72 (!) 121/93  Pulse: 86 86 88 (!) 50  Resp: 19 18 20 20   Temp: (!) 97.5 F (36.4 C)  (!) 97.5 F (36.4 C) 97.8 F (36.6 C)  TempSrc: Oral  Oral Oral  SpO2: 95%  91% 96%  Weight:      Height:        Intake/Output Summary (Last 24 hours) at 02/19/2020 1654 Last data filed at 02/19/2020 1500 Gross per 24 hour  Intake 992.59 ml  Output 1000 ml  Net -7.41 ml   Filed Weights   02/18/20 1802  Weight: 88.5 kg    Examination: General exam: Appears comfortable  HEENT: PERRLA, oral mucosa moist, no sclera icterus or thrush Respiratory system: crackles at bases Cardiovascular system: S1 & S2 heard, RRR.   Gastrointestinal system: Abdomen soft, non-tender, nondistended. Normal bowel sounds. Central nervous system: Alert and oriented. No focal neurological deficits. Extremities: No cyanosis, clubbing - right pedal edema Skin: No rashes or ulcers Psychiatry:  Mood & affect appropriate.     Data Reviewed: I have personally reviewed following labs and imaging studies  CBC: Recent Labs  Lab 02/18/20 1559  WBC 5.3  NEUTROABS 2.5  HGB 11.6*  HCT 35.8*  MCV 100.6*  PLT 150   Basic Metabolic Panel: Recent Labs  Lab 02/18/20 1559 02/19/20 1138  NA 137 139  K 2.8* 4.1  CL 102 104  CO2 25 26  GLUCOSE 112* 117*  BUN <5* 6*  CREATININE 0.69 0.82  CALCIUM 8.5* 8.9  MG 1.8  --    GFR: Estimated Creatinine Clearance: 100.8 mL/min (by C-G formula based on SCr of 0.82 mg/dL). Liver Function Tests: No results for input(s): AST, ALT, ALKPHOS, BILITOT, PROT, ALBUMIN in the last 168 hours. No results for input(s): LIPASE, AMYLASE in the last 168 hours. No results for input(s): AMMONIA in the last 168 hours. Coagulation Profile: No results for input(s): INR, PROTIME in the last 168 hours. Cardiac Enzymes: No results for input(s): CKTOTAL, CKMB, CKMBINDEX, TROPONINI in the last 168 hours. BNP (last 3 results) No  results for input(s): PROBNP in the last 8760 hours. HbA1C: No results for input(s): HGBA1C in the last 72 hours. CBG: No results for input(s): GLUCAP in the last 168 hours. Lipid Profile: No results for input(s): CHOL, HDL, LDLCALC, TRIG, CHOLHDL, LDLDIRECT in the last 72 hours. Thyroid Function Tests: No results for input(s): TSH, T4TOTAL, FREET4, T3FREE, THYROIDAB in the last 72 hours. Anemia Panel: No results for input(s): VITAMINB12, FOLATE, FERRITIN, TIBC, IRON, RETICCTPCT in the last 72 hours. Urine analysis: No results found for: COLORURINE, APPEARANCEUR, LABSPEC, PHURINE, GLUCOSEU, HGBUR, BILIRUBINUR, KETONESUR, PROTEINUR, UROBILINOGEN, NITRITE, LEUKOCYTESUR Sepsis Labs: @LABRCNTIP (procalcitonin:4,lacticidven:4) ) Recent Results (from the past 240 hour(s))  Respiratory Panel by RT PCR (Flu A&B, Covid) - Nasopharyngeal Swab     Status: None   Collection Time: 02/18/20  7:24 PM   Specimen: Nasopharyngeal Swab  Result Value Ref Range Status   SARS Coronavirus 2 by RT PCR NEGATIVE NEGATIVE Final    Comment: (NOTE) SARS-CoV-2 target nucleic acids are NOT DETECTED.  The SARS-CoV-2 RNA is generally detectable in upper respiratoy specimens during the acute phase of infection. The lowest concentration of SARS-CoV-2 viral copies this assay can detect is 131 copies/mL. A negative result does not preclude SARS-Cov-2 infection and should not be used as the sole basis for treatment or other patient management decisions. A negative result may occur with  improper specimen collection/handling, submission of specimen other than nasopharyngeal swab, presence of viral mutation(s) within the areas targeted by this assay, and inadequate number of viral copies (<131 copies/mL). A negative result must be combined with clinical observations, patient history, and epidemiological information. The expected result is Negative.  Fact Sheet for Patients:   02/20/20  Fact Sheet for Healthcare Providers:  https://www.moore.com/  This test is no t yet approved or cleared by the https://www.young.biz/ FDA and  has been authorized for detection and/or diagnosis of SARS-CoV-2 by FDA under an Emergency Use Authorization (EUA). This EUA will remain  in effect (meaning this test can be used) for the duration of the COVID-19 declaration under Section 564(b)(1) of the Act, 21 U.S.C. section 360bbb-3(b)(1), unless the authorization is terminated or revoked sooner.     Influenza A by PCR NEGATIVE NEGATIVE Final   Influenza B by PCR NEGATIVE NEGATIVE Final    Comment: (NOTE) The Xpert Xpress SARS-CoV-2/FLU/RSV assay is intended as an aid in  the diagnosis of influenza from Nasopharyngeal swab specimens and  should not be used as a sole basis for treatment. Nasal washings and  aspirates are unacceptable for Xpert Xpress SARS-CoV-2/FLU/RSV  testing.  Fact Sheet  for Patients: https://www.moore.com/  Fact Sheet for Healthcare Providers: https://www.young.biz/  This test is not yet approved or cleared by the [place] FDA and  has been authorized for detection and/or diagnosis of SARS-CoV-2 by  FDA under an Emergency Use Authorization (EUA). This EUA will remain  in effect (meaning this test can be used) for the duration of the  Covid-19 declaration under Section 564(b)(1) of the Act, 21  U.S.C. section 360bbb-3(b)(1), unless the authorization is  terminated or revoked. Performed at [facility], [address] [place], [place] [address]          Radiology Studies: DG Chest Portable 1 View  Result Date: 02/18/2020 CLINICAL DATA:  Shortness of breath, chest pain. Additional history provided: Chest pain and atrial fibrillation, history of COPD and smoking. EXAM: PORTABLE CHEST 1 VIEW COMPARISON:  Prior chest radiographs 02/23/2015 and earlier.  FINDINGS: Cardiomegaly. Central pulmonary vascular congestion. Prominence of the interstitial lung markings likely reflects interstitial edema. Small right greater than left pleural effusions with associated right basilar compressive atelectasis. No evidence of pneumothorax. No acute bony abnormality identified. IMPRESSION: Cardiomegaly with interstitial edema and small right greater than left pleural effusions. Associated right basilar compressive atelectasis. Electronically Signed   By: Jackey Loge DO   On: 02/18/2020 15:44      Scheduled Meds: . clonazePAM  1 mg Oral QHS  . finasteride  5 mg Oral Daily  . furosemide  40 mg Intravenous BID  . gabapentin  300 mg Oral q AM  . isosorbide mononitrate  30 mg Oral Daily  . metoprolol  200 mg Oral q AM  . mometasone-formoterol  2 puff Inhalation BID  . morphine  30 mg Oral Q12H  . pantoprazole  40 mg Oral QAC breakfast  . potassium chloride  20 mEq Oral Daily  . predniSONE  10 mg Oral Q breakfast  . ranolazine  500 mg Oral BID  . rivaroxaban  20 mg Oral Q supper  . rosuvastatin  10 mg Oral Daily  . sertraline  50 mg Oral q AM  . spironolactone  25 mg Oral Daily  . tamsulosin  0.4 mg Oral Daily  . traZODone  100 mg Oral QHS  . umeclidinium bromide  1 puff Inhalation Daily   Continuous Infusions:   LOS: 1 day      Calvert Cantor, MD [facility] Pager: [email or web address] 02/19/2020, 4:54 PM

## 2020-02-19 NOTE — Progress Notes (Signed)
   02/19/20 0233  Assess: MEWS Score  Temp 98.7 F (37.1 C)  BP (!) 138/102  Pulse Rate (!) 136  Resp 20  Level of Consciousness Alert  SpO2 95 %  O2 Device Room Air  Assess: MEWS Score  MEWS Temp 0  MEWS Systolic 0  MEWS Pulse 3  MEWS RR 0  MEWS LOC 0  MEWS Score 3  MEWS Score Color Yellow  Assess: if the MEWS score is Yellow or Red  Were vital signs taken at a resting state? Yes  Focused Assessment No change from prior assessment  Early Detection of Sepsis Score *See Row Information* Low  MEWS guidelines implemented *See Row Information* No, previously yellow, continue vital signs every 4 hours  Treat  MEWS Interventions Administered scheduled meds/treatments  Pain Scale 0-10  Pain Score 0  Take Vital Signs  Increase Vital Sign Frequency  Yellow: Q 2hr X 2 then Q 4hr X 2, if remains yellow, continue Q 4hrs  Escalate  MEWS: Escalate Yellow: discuss with charge nurse/RN and consider discussing with provider and RRT  Notify: Charge Nurse/RN  Name of Charge Nurse/RN Notified Jequetta RN  Date Charge Nurse/RN Notified 02/19/20  Time Charge Nurse/RN Notified 0230  Document  Patient Outcome Other (Comment) (HR trending down)  Progress note created (see row info) Yes  Pt's HR decreased to highs 90's low 100's post intervention.

## 2020-02-19 NOTE — Progress Notes (Signed)
Pt calm and cooperative throughout shift. Chronic back pain has been ongoing with a goal to have pain level at a tolerable number. Pain meds reviewed with MD during her rounds and changes were made. Pt aware of changes and plan. He received magnesium 2gm IV this shift as ordered. Also did EKG which is currently on the chart. HR noted to be in the 90's. Pt denies any pain and/or discomfort and has had no episodes of increased HR. He states "I can feel it when it happens and I haven't felt it all day". Will continue to monitor this patient.

## 2020-02-19 NOTE — Progress Notes (Signed)
Progress Note  Patient Name: Kevin Orozco Date of Encounter: 02/19/2020  Attending physician: Calvert Cantor, MD Primary care provider: Marletta Lor, NP Primary Cardiologist: Dr. Truett Mainland Inpatient Consultant:Joanne Brander [place], DO, [facility]  Subjective: Kevin Orozco is a 64 y.o. male who was seen and examined at bedside at approximately 1200pm No events overnight. Denies chest pain.  States that shortness of breath is improved. But states that "I do not feel well when I am in atrial fibrillation."  Patient states that he would like to be cardioverted back to regular rhythm. Morning labs still pending  Objective: Vital Signs in the last 24 hours: Temp:  [97.5 F (36.4 C)-98.7 F (37.1 C)] 97.5 F (36.4 C) (10/30 0859) Pulse Rate:  [47-136] 88 (10/30 0859) Resp:  [14-36] 20 (10/30 0859) BP: (117-150)/(72-102) 120/72 (10/30 0859) SpO2:  [91 %-98 %] 91 % (10/30 0859) Weight:  [88.5 kg] 88.5 kg (10/29 1802)  Intake/Output:  Intake/Output Summary (Last 24 hours) at 02/19/2020 1227 Last data filed at 02/19/2020 0644 Gross per 24 hour  Intake 462.59 ml  Output 500 ml  Net -37.41 ml    Net IO Since Admission: -37.41 mL [02/19/20 1227]  Weights:  Filed Weights   02/18/20 1802  Weight: 88.5 kg    Telemetry: Personally reviewed.  A. Fib.  Physical examination: PHYSICAL EXAM: Vitals with BMI 02/19/2020 02/19/2020 02/19/2020  Height - - -  Weight - - -  BMI - - -  Systolic 120 - 118  Diastolic 72 - 77  Pulse 88 86 86    CONSTITUTIONAL: Appears older than stated age, hemodynamically stable, no acute distress.   SKIN: Skin is warm and dry. No rash noted. No cyanosis. No pallor. No jaundice HEAD: Normocephalic and atraumatic.  Wearing nasal cannula oxygen. EYES: No scleral icterus MOUTH/THROAT: Moist oral membranes.  Poor oral dentition  NECK: No JVD present. No thyromegaly noted. No carotid bruits  LYMPHATIC: No visible cervical adenopathy.  CHEST Normal  respiratory effort. No intercostal retractions  LUNGS: Clear to auscultation bilaterally.  No stridor. No wheezes. No rales.  CARDIOVASCULAR: Irregular, positive S1-S2, no murmurs rubs or gallops appreciated ABDOMINAL: Soft, nontender, nondistended, positive bowel sounds, no apparent ascites.  EXTREMITIES: No peripheral edema  HEMATOLOGIC: No significant bruising NEUROLOGIC: Oriented to person, place, and time. Nonfocal. Normal muscle tone.  PSYCHIATRIC: Normal mood and affect. Normal behavior. Cooperative  Lab Results: Hematology Recent Labs  Lab 02/18/20 1559  WBC 5.3  RBC 3.56*  HGB 11.6*  HCT 35.8*  MCV 100.6*  MCH 32.6  MCHC 32.4  RDW 14.0  PLT 150    Chemistry Recent Labs  Lab 02/18/20 1559  NA 137  K 2.8*  CL 102  CO2 25  GLUCOSE 112*  BUN <5*  CREATININE 0.69  CALCIUM 8.5*  GFRNONAA >60  ANIONGAP 10     Cardiac Enzymes: Cardiac Panel (last 3 results) Recent Labs    02/18/20 1744 02/18/20 2334 02/19/20 0149  TROPONINIHS 5 6 6     BNP (last 3 results) Recent Labs    02/18/20 1559  BNP 307.4*    ProBNP (last 3 results) No results for input(s): PROBNP in the last 8760 hours.   DDimer No results for input(s): DDIMER in the last 168 hours.   Hemoglobin A1c:  Lab Results  Component Value Date   HGBA1C 5.4 09/09/2019   MPG 97 02/22/2015    TSH No results for input(s): TSH in the last 8760 hours.  Lipid Panel     Component  Value Date/Time   CHOL 125 04/20/2012 0500   TRIG 100 04/20/2012 0500   HDL 36 (L) 04/20/2012 0500   CHOLHDL 3.5 04/20/2012 0500   VLDL 20 04/20/2012 0500   LDLCALC 69 04/20/2012 0500    Imaging: DG Chest Portable 1 View  Result Date: 02/18/2020 CLINICAL DATA:  Shortness of breath, chest pain. Additional history provided: Chest pain and atrial fibrillation, history of COPD and smoking. EXAM: PORTABLE CHEST 1 VIEW COMPARISON:  Prior chest radiographs 02/23/2015 and earlier. FINDINGS: Cardiomegaly. Central  pulmonary vascular congestion. Prominence of the interstitial lung markings likely reflects interstitial edema. Small right greater than left pleural effusions with associated right basilar compressive atelectasis. No evidence of pneumothorax. No acute bony abnormality identified. IMPRESSION: Cardiomegaly with interstitial edema and small right greater than left pleural effusions. Associated right basilar compressive atelectasis. Electronically Signed   By: Jackey Loge DO   On: 02/18/2020 15:44    Cardiac database: EKG 02/18/2020: Afib w/RVR 116 bpm Nonspecific ST-T changes  Echocardiogram 12/14/2019:  Left ventricle cavity is normal in size and wall thickness. Mildly  depressed LV systolic function with visual EF 45-50%. Normal global wall  motion. Diastolic function not assessed due to Afib.  Left atrial cavity is moderately dilated.  Moderate (Grade II) mitral regurgitation.  Mild to moderate tricuspid regurgitation. Estimated pulmonary artery  systolic pressure 23 mmHg.  Coronary angiography 2012: LM: Normal LAD: Ostial 40-50% stenosis, mid LAD 30% stenosis LCx: OM2 30% stenosis RCA: Mid 20% stenosis and posterior lateral branch  Scheduled Meds: . clonazePAM  1 mg Oral QHS  . finasteride  5 mg Oral Daily  . furosemide  40 mg Intravenous BID  . gabapentin  300 mg Oral q AM  . isosorbide mononitrate  30 mg Oral Daily  . metoprolol  200 mg Oral q AM  . mometasone-formoterol  2 puff Inhalation BID  . morphine  30 mg Oral Q12H  . pantoprazole  40 mg Oral QAC breakfast  . potassium chloride  20 mEq Oral Daily  . predniSONE  10 mg Oral Q breakfast  . ranolazine  500 mg Oral BID  . rivaroxaban  20 mg Oral Q supper  . rosuvastatin  10 mg Oral Daily  . sertraline  50 mg Oral q AM  . spironolactone  25 mg Oral Daily  . tamsulosin  0.4 mg Oral Daily  . traZODone  100 mg Oral QHS  . umeclidinium bromide  1 puff Inhalation Daily    Continuous Infusions:   PRN  Meds: acetaminophen **OR** acetaminophen, clonazePAM, nitroGLYCERIN, oxyCODONE-acetaminophen **AND** oxyCODONE   IMPRESSION & RECOMMENDATIONS: Kevin Orozco is a 64 y.o. male whose past medical history and cardiac risk factors include: hypertension, paroxysmal Afib, moderate nonobstructive coronary artery disease (cath 2012), COPD, active smoker, prior h/o polysubstance abuse (EtOH, cocaine).  Acute heart failure with mildly reduced LVEF: Echocardiogram in 11/2019 showed EF 45-50%.  Currently on Lasix 40 mg IV twice daily, Imdur 30 mg p.o. daily, Toprol-XL 200 mg p.o. every morning, spironolactone 25 mg p.o. daily, Ranexa 500 mg p.o. twice daily, Crestor 10 mg p.o. nightly. No morning labs available for review.  Still pending. Ventricular rate has improved since admission.  Will obtain EKG. Strict I's and O's and daily weights.  Afib: Likely persistent.  Ventricular rate has improved since admission.  Check EKG  CHA2DS2VASc score2, annual stroke risk2%. Continue Xarelto to 20 mg p.o. q. supper Risks, benefits, and alternatives to oral anticoagulation discussed with the patient at today's.  He  verbalizes understanding and provides verbal feedback. Of note, patient is currently not on calcium channel blocker due to mildly reduced left ventricular systolic function.  His last use of cocaine was approximately 3 years ago per patient.  Patient understands not to use cocaine as he is currently on Toprol-XL (i.e. beta-blockers).  The use of cocaine in the setting of beta-blockers can cause worsening morbidity mortality and even death.  He verbalizes understanding and claims to know this already.  CAD: Antianginal therapy including beta-blockers, Ranexa. Continue statin therapy. Educated on the importance of complete smoking cessation. Educated on the importance of complete cessation of polysubstance abuse.  Hypertension: Well-controlled.  Continue current antihypertensive  medications.  Hypokalemia: Morning labs pending.  Currently managed by primary team.  Patient's questions and concerns were addressed to his satisfaction. He voices understanding of the instructions provided during this encounter.   This note was created using a voice recognition software as a result there may be grammatical errors inadvertently enclosed that do not reflect the nature of this encounter. Every attempt is made to correct such errors.  Tessa Lerner, DO, [facility] Office: [phone] 02/19/2020, 12:27 PM

## 2020-02-20 LAB — BASIC METABOLIC PANEL
Anion gap: 6 (ref 5–15)
BUN: 9 mg/dL (ref 8–23)
CO2: 26 mmol/L (ref 22–32)
Calcium: 9 mg/dL (ref 8.9–10.3)
Chloride: 104 mmol/L (ref 98–111)
Creatinine, Ser: 0.92 mg/dL (ref 0.61–1.24)
GFR, Estimated: >60 mL/min (ref >60)
Glucose, Bld: 104 mg/dL — ABNORMAL HIGH (ref 70–99)
Potassium: 4 mmol/L (ref 3.5–5.1)
Sodium: 136 mmol/L (ref 135–145)

## 2020-02-20 NOTE — Progress Notes (Addendum)
Progress Note  Patient Name: Kevin Orozco Date of Encounter: 02/20/2020  Attending physician: Calvert Cantor, MD Primary care provider: Marletta Lor, NP Primary Cardiologist: Dr. Truett Mainland Inpatient Consultant:Emma Birchler [place] Agu[place], DO, [facility] [address]  Subjective: Kevin Orozco is a 64 y.o. male who was seen and examined at bedside at approximately 230pm No events overnight. Denies chest pain.  States that shortness of breath is improved. Kevin Orozco still remains symptomatic with his Afib; but feels better compared to yesterday.  Care discussed with the patient's nurse.  Kevin Orozco did ambulate on the floors with his heart rate ranging between 80-105 bpm.  A bit dyspneic while ambulating but improving.  Objective: Vital Signs in the last 24 hours: Temp:  [97.9 F (36.6 C)-98 F (36.7 C)] 97.9 F (36.6 C) (10/31 1123) Pulse Rate:  [98-105] 103 (10/31 1123) Resp:  [18] 18 (10/31 1123) BP: (108-124)/(72-83) 109/77 (10/31 1123) SpO2:  [94 %-95 %] 95 % (10/31 1123) Weight:  [85.4 kg] 85.4 kg (10/31 0456)  Intake/Output:  Intake/Output Summary (Last 24 hours) at 02/20/2020 1441 Last data filed at 02/20/2020 0654 Gross per 24 hour  Intake 250 ml  Output 200 ml  Net 50 ml    Net IO Since Admission: -7.41 mL [02/20/20 1441]  Weights:  Filed Weights   02/18/20 1802 02/20/20 0456  Weight: 88.5 kg 85.4 kg    Telemetry: Personally reviewed.  A. Fib.  Physical examination: PHYSICAL EXAM: Vitals with BMI 02/20/2020 02/20/2020 02/20/2020  Height - - -  Weight - - 188 lbs 3 oz  BMI - - 26.26  Systolic 109 108 073  Diastolic 77 82 72  Pulse 103 105 98    CONSTITUTIONAL: Appears older than stated age, hemodynamically stable, no acute distress.   SKIN: Skin is warm and dry. No rash noted. No cyanosis. No pallor. No jaundice HEAD: Normocephalic and atraumatic.  Wearing nasal cannula oxygen. EYES: No scleral icterus MOUTH/THROAT: Moist oral membranes.  Poor oral dentition  NECK: No JVD present. No  thyromegaly noted. No carotid bruits  LYMPHATIC: No visible cervical adenopathy.  CHEST Normal respiratory effort. No intercostal retractions  LUNGS: Clear to auscultation bilaterally.  No stridor. No wheezes. No rales.  CARDIOVASCULAR: Irregular, positive S1-S2, no murmurs rubs or gallops appreciated ABDOMINAL: Soft, nontender, nondistended, positive bowel sounds, no apparent ascites.  EXTREMITIES: No peripheral edema  HEMATOLOGIC: No significant bruising NEUROLOGIC: Oriented to person, place, and time. Nonfocal. Normal muscle tone.  PSYCHIATRIC: Normal mood and affect. Normal behavior. Cooperative  Lab Results: Hematology Recent Labs  Lab 02/18/20 1559  WBC 5.3  RBC 3.56*  HGB 11.6*  HCT 35.8*  MCV 100.6*  MCH 32.6  MCHC 32.4  RDW 14.0  PLT 150    Chemistry Recent Labs  Lab 02/18/20 1559 02/19/20 1138 02/20/20 0955  NA 137 139 136  K 2.8* 4.1 4.0  CL 102 104 104  CO2 25 26 26   GLUCOSE 112* 117* 104*  BUN <5* 6* 9  CREATININE 0.69 0.82 0.92  CALCIUM 8.5* 8.9 9.0  GFRNONAA >60 >60 >60  ANIONGAP 10 9 6      Cardiac Enzymes: Cardiac Panel (last 3 results) Recent Labs    02/18/20 1744 02/18/20 2334 02/19/20 0149  TROPONINIHS 5 6 6     BNP (last 3 results) Recent Labs    02/18/20 1559 02/19/20 1138  BNP 307.4* 426.5*    ProBNP (last 3 results) No results for input(s): PROBNP in the last 8760 hours.   DDimer No results for input(s): DDIMER in the last  168 hours.   Hemoglobin A1c:  Lab Results  Component Value Date   HGBA1C 5.4 09/09/2019   MPG 97 02/22/2015    TSH No results for input(s): TSH in the last 8760 hours.  Lipid Panel     Component Value Date/Time   CHOL 125 04/20/2012 0500   TRIG 100 04/20/2012 0500   HDL 36 (L) 04/20/2012 0500   CHOLHDL 3.5 04/20/2012 0500   VLDL 20 04/20/2012 0500   LDLCALC 69 04/20/2012 0500    Imaging: DG Chest Portable 1 View  Result Date: 02/18/2020 CLINICAL DATA:  Shortness of breath, chest pain.  Additional history provided: Chest pain and atrial fibrillation, history of COPD and smoking. EXAM: PORTABLE CHEST 1 VIEW COMPARISON:  Prior chest radiographs 02/23/2015 and earlier. FINDINGS: Cardiomegaly. Central pulmonary vascular congestion. Prominence of the interstitial lung markings likely reflects interstitial edema. Small right greater than left pleural effusions with associated right basilar compressive atelectasis. No evidence of pneumothorax. No acute bony abnormality identified. IMPRESSION: Cardiomegaly with interstitial edema and small right greater than left pleural effusions. Associated right basilar compressive atelectasis. Electronically Signed   By: Jackey Loge DO   On: 02/18/2020 15:44    Cardiac database: EKG 02/18/2020: Afib w/RVR 116 bpm Nonspecific ST-T changes  02/20/2020: Atrial fibrillation, 94 bpm, without underlying injury pattern.  Echocardiogram 12/14/2019:  Left ventricle cavity is normal in size and wall thickness. Mildly  depressed LV systolic function with visual EF 45-50%. Normal global wall  motion. Diastolic function not assessed due to Afib.  Left atrial cavity is moderately dilated.  Moderate (Grade II) mitral regurgitation.  Mild to moderate tricuspid regurgitation. Estimated pulmonary artery  systolic pressure 23 mmHg.  Coronary angiography 2012: LM: Normal LAD: Ostial 40-50% stenosis, mid LAD 30% stenosis LCx: OM2 30% stenosis RCA: Mid 20% stenosis and posterior lateral branch  Scheduled Meds: . clonazePAM  1 mg Oral QHS  . finasteride  5 mg Oral Daily  . furosemide  40 mg Intravenous BID  . gabapentin  300 mg Oral q AM  . isosorbide mononitrate  30 mg Oral Daily  . metoprolol  200 mg Oral q AM  . mometasone-formoterol  2 puff Inhalation BID  . morphine  30 mg Oral Q12H  . pantoprazole  40 mg Oral QAC breakfast  . potassium chloride  20 mEq Oral Daily  . predniSONE  10 mg Oral Q breakfast  . ranolazine  500 mg Oral BID  . rivaroxaban   20 mg Oral Q supper  . rosuvastatin  10 mg Oral Daily  . sertraline  50 mg Oral q AM  . spironolactone  25 mg Oral Daily  . tamsulosin  0.4 mg Oral Daily  . traZODone  100 mg Oral QHS  . umeclidinium bromide  1 puff Inhalation Daily    Continuous Infusions:   PRN Meds: acetaminophen **OR** acetaminophen, clonazePAM, nitroGLYCERIN, oxyCODONE-acetaminophen **AND** oxyCODONE   IMPRESSION & RECOMMENDATIONS: Loyde Orth is a 64 y.o. male whose past medical history and cardiac risk factors include: hypertension, paroxysmal Afib, moderate nonobstructive coronary artery disease (cath 2012), COPD, active smoker, prior h/o polysubstance abuse (EtOH, cocaine).  Acute heart failure with mildly reduced LVEF: Improving Echocardiogram in 11/2019 showed EF 45-50%.  Currently on Lasix 40 mg IV twice daily, Imdur 30 mg p.o. daily, Toprol-XL 200 mg p.o. every morning, spironolactone 25 mg p.o. daily, Ranexa 500 mg p.o. twice daily, Crestor 10 mg p.o. nightly. Ventricular rate has improved since admission.  EKG this morning notes atrial fibrillation  is the underlying rhythm.  On telemetry patient appears to be transitioning to sinus rhythm but predominantly A. fib. Strict I's and O's and daily weights.  Afib: Likely persistent.  Ventricular rate has improved since admission.  Morning EKG reviewed findings noted above. CHA2DS2VASc score2, annual stroke risk2%. Continue Xarelto to 20 mg p.o. q. supper Risks, benefits, and alternatives to oral anticoagulation discussed with the patient at today's.  Kevin Orozco verbalizes understanding and provides verbal feedback. We will keep the patient n.p.o. after midnight. If patient remains in atrial fibrillation and is symptomatic would consider cardioversion either 02/21/2020 or 02/22/2020.  CAD: Stable Antianginal therapy including beta-blockers, Ranexa. Continue statin therapy. Educated on the importance of complete smoking cessation. Educated on the importance  of complete cessation of polysubstance abuse.  Hypertension: Well-controlled.  Continue current antihypertensive medications.  Hypokalemia: Resolved currently managed by primary team.  Plan of care discussed patient's nurse Minerva Areola).  Dr. Truett Mainland will resume care starting 02/21/2020.   Time spent: 26 minutes.  Patient's questions and concerns were addressed to his satisfaction. Kevin Orozco voices understanding of the instructions provided during this encounter.   This note was created using a voice recognition software as a result there may be grammatical errors inadvertently enclosed that do not reflect the nature of this encounter. Every attempt is made to correct such errors.  Tessa Lerner, DO, [facility] Office: [phone] 02/20/2020, 2:41 PM

## 2020-02-20 NOTE — Progress Notes (Signed)
PROGRESS NOTE    Kevin Orozco   PRF:[id number]  DOB: 06/02/1955  DOA: 02/18/2020 PCP: Marletta Lor, NP   Brief Narrative:  Kevin Orozco is a 64 y.o. male with history of systolic CHF, A. fib, COPD, macrocytic anemia, chronic pain presents to the ER because of increasing shortness of breath over the last 1 week with pedal edema.  Shortness of breath happens mostly on exertion.  Has been having at times chest pressure retrosternal.    In ED > CXR showing pulmonary edema   Subjective: Has dyspnea on exertion.    Assessment & Plan:   Principal Problem:   Acute on chronic systolic CHF (congestive heart failure)  Essential HTN - per cardiology note, ECHO from 8/21 > EF 40-45% - crackles at bases - no more right leg edema today - cont IV Lasix and Spironolactone - pulse ox 89 % on room air today on exertion - cont Metoprolol and Imdur - add ARB or Entresto prior to d/c     Active Problems:   Hypokalemia - K 2.8-  - has been replaced and is normal now   Atrial fibrillation with RVR (HCC) - HR in low 100s today- he feels symptomatic/ tachycardic - cardiology considering cardioversion after diuresis in 1-2 days - on Metoprolol and Xarelto  Stable Angina -cont Imdur  Chronic back  pain - cont home dose of MS contin and Oxycodone   Macrocytic anemia - apparently this is chronic  Prior h/o Cocaine and alcohol abuse  Of note, patient takes Prednisone 10 mg daily for the past couple of days and states his nurse practitioner gave it to him but he is not sure what it is for.   Time spent in minutes: 35 DVT prophylaxis:  rivaroxaban (XARELTO) tablet 20 mg  Code Status: full code Family Communication:  Disposition Plan:  Status is: Inpatient  Remains inpatient appropriate because:A-fib with RVR and pulmonary edema   Dispo: The patient is from: Home              Anticipated d/c is to: Home              Anticipated d/c date is: > 3 days              Patient  currently is not medically stable to d/c.   Consultants:   cardiology Procedures:   none Antimicrobials:  Anti-infectives (From admission, onward)   None       Objective: Vitals:   02/20/20 0456 02/20/20 0651 02/20/20 1102 02/20/20 1123  BP: 124/72 108/82 109/77 109/77  Pulse: 98 (!) 105 (!) 103 (!) 103  Resp: 18  18 18   Temp: 98 F (36.7 C)  97.9 F (36.6 C) 97.9 F (36.6 C)  TempSrc: Oral   Oral  SpO2: 95%   95%  Weight: 85.4 kg     Height:        Intake/Output Summary (Last 24 hours) at 02/20/2020 1451 Last data filed at 02/20/2020 0654 Gross per 24 hour  Intake 250 ml  Output 200 ml  Net 50 ml   Filed Weights   02/18/20 1802 02/20/20 0456  Weight: 88.5 kg 85.4 kg    Examination: General exam: Appears comfortable  HEENT: PERRLA, oral mucosa moist, no sclera icterus or thrush Respiratory system: crackles at bases Cardiovascular system: S1 & S2 heard,  No murmurs IIRR Gastrointestinal system: Abdomen soft, non-tender, nondistended. Normal bowel sounds   Central nervous system: Alert and oriented. No focal neurological deficits. Extremities:  No cyanosis, clubbing or edema Skin: No rashes or ulcers Psychiatry:  Mood & affect appropriate.    Data Reviewed: I have personally reviewed following labs and imaging studies  CBC: Recent Labs  Lab 02/18/20 1559  WBC 5.3  NEUTROABS 2.5  HGB 11.6*  HCT 35.8*  MCV 100.6*  PLT 150   Basic Metabolic Panel: Recent Labs  Lab 02/18/20 1559 02/19/20 1138 02/19/20 1631 02/20/20 0955  NA 137 139  --  136  K 2.8* 4.1  --  4.0  CL 102 104  --  104  CO2 25 26  --  26  GLUCOSE 112* 117*  --  104*  BUN <5* 6*  --  9  CREATININE 0.69 0.82  --  0.92  CALCIUM 8.5* 8.9  --  9.0  MG 1.8  --  2.1  --    GFR: Estimated Creatinine Clearance: 89.8 mL/min (by C-G formula based on SCr of 0.92 mg/dL). Liver Function Tests: No results for input(s): AST, ALT, ALKPHOS, BILITOT, PROT, ALBUMIN in the last 168 hours. No  results for input(s): LIPASE, AMYLASE in the last 168 hours. No results for input(s): AMMONIA in the last 168 hours. Coagulation Profile: No results for input(s): INR, PROTIME in the last 168 hours. Cardiac Enzymes: No results for input(s): CKTOTAL, CKMB, CKMBINDEX, TROPONINI in the last 168 hours. BNP (last 3 results) No results for input(s): PROBNP in the last 8760 hours. HbA1C: No results for input(s): HGBA1C in the last 72 hours. CBG: No results for input(s): GLUCAP in the last 168 hours. Lipid Profile: No results for input(s): CHOL, HDL, LDLCALC, TRIG, CHOLHDL, LDLDIRECT in the last 72 hours. Thyroid Function Tests: No results for input(s): TSH, T4TOTAL, FREET4, T3FREE, THYROIDAB in the last 72 hours. Anemia Panel: No results for input(s): VITAMINB12, FOLATE, FERRITIN, TIBC, IRON, RETICCTPCT in the last 72 hours. Urine analysis: No results found for: COLORURINE, APPEARANCEUR, LABSPEC, PHURINE, GLUCOSEU, HGBUR, BILIRUBINUR, KETONESUR, PROTEINUR, UROBILINOGEN, NITRITE, LEUKOCYTESUR Sepsis Labs: @LABRCNTIP (procalcitonin:4,lacticidven:4) ) Recent Results (from the past 240 hour(s))  Respiratory Panel by RT PCR (Flu A&B, Covid) - Nasopharyngeal Swab     Status: None   Collection Time: 02/18/20  7:24 PM   Specimen: Nasopharyngeal Swab  Result Value Ref Range Status   SARS Coronavirus 2 by RT PCR NEGATIVE NEGATIVE Final    Comment: (NOTE) SARS-CoV-2 target nucleic acids are NOT DETECTED.  The SARS-CoV-2 RNA is generally detectable in upper respiratoy specimens during the acute phase of infection. The lowest concentration of SARS-CoV-2 viral copies this assay can detect is 131 copies/mL. A negative result does not preclude SARS-Cov-2 infection and should not be used as the sole basis for treatment or other patient management decisions. A negative result may occur with  improper specimen collection/handling, submission of specimen other than nasopharyngeal swab, presence of viral  mutation(s) within the areas targeted by this assay, and inadequate number of viral copies (<131 copies/mL). A negative result must be combined with clinical observations, patient history, and epidemiological information. The expected result is Negative.  Fact Sheet for Patients:  02/20/20  Fact Sheet for Healthcare Providers:  https://www.moore.com/  This test is no t yet approved or cleared by the https://www.young.biz/ FDA and  has been authorized for detection and/or diagnosis of SARS-CoV-2 by FDA under an Emergency Use Authorization (EUA). This EUA will remain  in effect (meaning this test can be used) for the duration of the COVID-19 declaration under Section 564(b)(1) of the Act, 21 U.S.C. section 360bbb-3(b)(1), unless the  authorization is terminated or revoked sooner.     Influenza A by PCR NEGATIVE NEGATIVE Final   Influenza B by PCR NEGATIVE NEGATIVE Final    Comment: (NOTE) The Xpert Xpress SARS-CoV-2/FLU/RSV assay is intended as an aid in  the diagnosis of influenza from Nasopharyngeal swab specimens and  should not be used as a sole basis for treatment. Nasal washings and  aspirates are unacceptable for Xpert Xpress SARS-CoV-2/FLU/RSV  testing.  Fact Sheet for Patients: https://www.moore.com/  Fact Sheet for Healthcare Providers: https://www.young.biz/  This test is not yet approved or cleared by the [place] FDA and  has been authorized for detection and/or diagnosis of SARS-CoV-2 by  FDA under an Emergency Use Authorization (EUA). This EUA will remain  in effect (meaning this test can be used) for the duration of the  Covid-19 declaration under Section 564(b)(1) of the Act, 21  U.S.C. section 360bbb-3(b)(1), unless the authorization is  terminated or revoked. Performed at [facility], [address] [place], [place] [address]          Radiology Studies: DG  Chest Portable 1 View  Result Date: 02/18/2020 CLINICAL DATA:  Shortness of breath, chest pain. Additional history provided: Chest pain and atrial fibrillation, history of COPD and smoking. EXAM: PORTABLE CHEST 1 VIEW COMPARISON:  Prior chest radiographs 02/23/2015 and earlier. FINDINGS: Cardiomegaly. Central pulmonary vascular congestion. Prominence of the interstitial lung markings likely reflects interstitial edema. Small right greater than left pleural effusions with associated right basilar compressive atelectasis. No evidence of pneumothorax. No acute bony abnormality identified. IMPRESSION: Cardiomegaly with interstitial edema and small right greater than left pleural effusions. Associated right basilar compressive atelectasis. Electronically Signed   By: Jackey Loge DO   On: 02/18/2020 15:44      Scheduled Meds: . clonazePAM  1 mg Oral QHS  . finasteride  5 mg Oral Daily  . furosemide  40 mg Intravenous BID  . gabapentin  300 mg Oral q AM  . isosorbide mononitrate  30 mg Oral Daily  . metoprolol  200 mg Oral q AM  . mometasone-formoterol  2 puff Inhalation BID  . morphine  30 mg Oral Q12H  . pantoprazole  40 mg Oral QAC breakfast  . potassium chloride  20 mEq Oral Daily  . predniSONE  10 mg Oral Q breakfast  . ranolazine  500 mg Oral BID  . rivaroxaban  20 mg Oral Q supper  . rosuvastatin  10 mg Oral Daily  . sertraline  50 mg Oral q AM  . spironolactone  25 mg Oral Daily  . tamsulosin  0.4 mg Oral Daily  . traZODone  100 mg Oral QHS  . umeclidinium bromide  1 puff Inhalation Daily   Continuous Infusions:   LOS: 2 days      Calvert Cantor, MD [facility] Pager: [email or web address] 02/20/2020, 2:51 PM

## 2020-02-20 NOTE — Progress Notes (Signed)
Pt's O2 sats remained in the upper 90's while in bed on room air.  During ambulation without O2 in the hall, pt's sats were in the low 90's, briefly dropping to 88-89.  HR varied between 80-105.  NAD

## 2020-02-21 ENCOUNTER — Encounter (HOSPITAL_COMMUNITY)
Admission: EM | Disposition: A | Discharge status: Home / Self Care | Payer: Self-pay | Source: Home / Self Care | Attending: Internal Medicine

## 2020-02-21 ENCOUNTER — Encounter (HOSPITAL_COMMUNITY): Payer: Self-pay | Admitting: Internal Medicine

## 2020-02-21 ENCOUNTER — Inpatient Hospital Stay (HOSPITAL_COMMUNITY): Payer: Medicaid Other | Admitting: Anesthesiology

## 2020-02-21 HISTORY — PX: CARDIOVERSION: SHX1299

## 2020-02-21 LAB — BASIC METABOLIC PANEL
Anion gap: 11 (ref 5–15)
BUN: 10 mg/dL (ref 8–23)
CO2: 24 mmol/L (ref 22–32)
Calcium: 9 mg/dL (ref 8.9–10.3)
Chloride: 102 mmol/L (ref 98–111)
Creatinine, Ser: 0.88 mg/dL (ref 0.61–1.24)
GFR, Estimated: >60 mL/min (ref >60)
Glucose, Bld: 89 mg/dL (ref 70–99)
Potassium: 3.8 mmol/L (ref 3.5–5.1)
Sodium: 137 mmol/L (ref 135–145)

## 2020-02-21 LAB — PROTIME-INR
INR: 2.2 — ABNORMAL HIGH (ref 0.8–1.2)
Prothrombin Time: 23.9 seconds — ABNORMAL HIGH (ref 11.4–15.2)

## 2020-02-21 SURGERY — CARDIOVERSION
Anesthesia: General

## 2020-02-21 MED ORDER — EPHEDRINE SULFATE-NACL 50-0.9 MG/10ML-% IV SOSY
PREFILLED_SYRINGE | INTRAVENOUS | Status: DC | PRN
  Administered 2020-02-21: 10 mg via INTRAVENOUS

## 2020-02-21 MED ORDER — PROPOFOL 10 MG/ML IV BOLUS
INTRAVENOUS | Status: DC | PRN
  Administered 2020-02-21: 60 mg via INTRAVENOUS

## 2020-02-21 MED ORDER — LIDOCAINE 2% (20 MG/ML) 5 ML SYRINGE
INTRAMUSCULAR | Status: DC | PRN
  Administered 2020-02-21: 80 mg via INTRAVENOUS

## 2020-02-21 MED ORDER — SODIUM CHLORIDE 0.9 % IV SOLN: INTRAVENOUS | Status: DC | PRN

## 2020-02-21 NOTE — Progress Notes (Signed)
PROGRESS NOTE    Kevin Orozco   HWE:[id number]  DOB: [birth year]  DOA: 02/18/2020 PCP: Marletta Lor, NP   Brief Narrative:  Kevin Orozco is a 64 y.o. male with history of systolic CHF, A. fib, COPD, macrocytic anemia, chronic pain presents to the ER because of increasing shortness of breath over the last 1 week with pedal edema.  Shortness of breath happens mostly on exertion.  Has been having at times chest pressure retrosternal.    In ED > CXR showing pulmonary edema   Subjective: Still has dyspnea on exertion today.     Assessment & Plan:   Principal Problem:   Acute on chronic systolic CHF (congestive heart failure)  Essential HTN - per cardiology note, ECHO from 8/21 > EF 40-45% - crackles at bases - no more right leg edema today - cont IV Lasix and Spironolactone per cardiology - pulse ox 89 % on room air today on exertion - cont Metoprolol and Imdur - add ARB or Entresto prior to d/c     Active Problems:   Hypokalemia - K 2.8-  - has been replaced and is normal now   Atrial fibrillation with RVR (HCC) - HR in low 100s today- he feels symptomatic/ tachycardic - on Metoprolol and Xarelto - cardioversion today  Stable Angina -cont Imdur  Chronic back  pain - cont home dose of MS contin and Oxycodone   Macrocytic anemia - apparently this is chronic  Prior h/o Cocaine and alcohol abuse  Of note, patient takes Prednisone 10 mg daily for the past couple of months and states his nurse practitioner gave it to him but he is not sure what it is for.   Time spent in minutes: 35 DVT prophylaxis:  rivaroxaban (XARELTO) tablet 20 mg  Code Status: full code Family Communication:  Disposition Plan:  Status is: Inpatient  Remains inpatient appropriate because:A-fib with RVR and pulmonary edema   Dispo: The patient is from: Home              Anticipated d/c is to: Home              Anticipated d/c date is: > 3 days              Patient currently is not  medically stable to d/c.   Consultants:   cardiology Procedures:   none Antimicrobials:  Anti-infectives (From admission, onward)   None       Objective: Vitals:   02/21/20 1244 02/21/20 1247 02/21/20 1253 02/21/20 1301  BP: 97/65 (!) 84/68 100/69 110/66  Pulse: (!) 54 (!) 59 64 (!) 59  Resp: 19 19 (!) 21 16  Temp:      TempSrc:      SpO2: 96% 96% 95% 95%  Weight:      Height:        Intake/Output Summary (Last 24 hours) at 02/21/2020 1545 Last data filed at 02/21/2020 1239 Gross per 24 hour  Intake 490 ml  Output 1150 ml  Net -660 ml   Filed Weights   02/20/20 0456 02/21/20 0214 02/21/20 1204  Weight: 85.4 kg 83.5 kg 85.3 kg    Examination: General exam: Appears comfortable  HEENT: PERRLA, oral mucosa moist, no sclera icterus or thrush Respiratory system: mild crackles at bases Cardiovascular system: S1 & S2 heard,  No murmurs IIRR Gastrointestinal system: Abdomen soft, non-tender, nondistended. Normal bowel sounds   Central nervous system: Alert and oriented. No focal neurological deficits. Extremities: No cyanosis, clubbing or  edema Skin: No rashes or ulcers Psychiatry:  Mood & affect appropriate.    Data Reviewed: I have personally reviewed following labs and imaging studies  CBC: Recent Labs  Lab 02/18/20 1559  WBC 5.3  NEUTROABS 2.5  HGB 11.6*  HCT 35.8*  MCV 100.6*  PLT 150   Basic Metabolic Panel: Recent Labs  Lab 02/18/20 1559 02/19/20 1138 02/19/20 1631 02/20/20 0955 02/21/20 0521  NA 137 139  --  136 137  K 2.8* 4.1  --  4.0 3.8  CL 102 104  --  104 102  CO2 25 26  --  26 24  GLUCOSE 112* 117*  --  104* 89  BUN <5* 6*  --  9 10  CREATININE 0.69 0.82  --  0.92 0.88  CALCIUM 8.5* 8.9  --  9.0 9.0  MG 1.8  --  2.1  --   --    GFR: Estimated Creatinine Clearance: 93.9 mL/min (by C-G formula based on SCr of 0.88 mg/dL). Liver Function Tests: No results for input(s): AST, ALT, ALKPHOS, BILITOT, PROT, ALBUMIN in the last 168  hours. No results for input(s): LIPASE, AMYLASE in the last 168 hours. No results for input(s): AMMONIA in the last 168 hours. Coagulation Profile: Recent Labs  Lab 02/21/20 1014  INR 2.2*   Cardiac Enzymes: No results for input(s): CKTOTAL, CKMB, CKMBINDEX, TROPONINI in the last 168 hours. BNP (last 3 results) No results for input(s): PROBNP in the last 8760 hours. HbA1C: No results for input(s): HGBA1C in the last 72 hours. CBG: No results for input(s): GLUCAP in the last 168 hours. Lipid Profile: No results for input(s): CHOL, HDL, LDLCALC, TRIG, CHOLHDL, LDLDIRECT in the last 72 hours. Thyroid Function Tests: No results for input(s): TSH, T4TOTAL, FREET4, T3FREE, THYROIDAB in the last 72 hours. Anemia Panel: No results for input(s): VITAMINB12, FOLATE, FERRITIN, TIBC, IRON, RETICCTPCT in the last 72 hours. Urine analysis: No results found for: COLORURINE, APPEARANCEUR, LABSPEC, PHURINE, GLUCOSEU, HGBUR, BILIRUBINUR, KETONESUR, PROTEINUR, UROBILINOGEN, NITRITE, LEUKOCYTESUR Sepsis Labs: @LABRCNTIP (procalcitonin:4,lacticidven:4) ) Recent Results (from the past 240 hour(s))  Respiratory Panel by RT PCR (Flu A&B, Covid) - Nasopharyngeal Swab     Status: None   Collection Time: 02/18/20  7:24 PM   Specimen: Nasopharyngeal Swab  Result Value Ref Range Status   SARS Coronavirus 2 by RT PCR NEGATIVE NEGATIVE Final    Comment: (NOTE) SARS-CoV-2 target nucleic acids are NOT DETECTED.  The SARS-CoV-2 RNA is generally detectable in upper respiratoy specimens during the acute phase of infection. The lowest concentration of SARS-CoV-2 viral copies this assay can detect is 131 copies/mL. A negative result does not preclude SARS-Cov-2 infection and should not be used as the sole basis for treatment or other patient management decisions. A negative result may occur with  improper specimen collection/handling, submission of specimen other than nasopharyngeal swab, presence of viral  mutation(s) within the areas targeted by this assay, and inadequate number of viral copies (<131 copies/mL). A negative result must be combined with clinical observations, patient history, and epidemiological information. The expected result is Negative.  Fact Sheet for Patients:  02/20/20  Fact Sheet for Healthcare Providers:  https://www.moore.com/  This test is no t yet approved or cleared by the https://www.young.biz/ FDA and  has been authorized for detection and/or diagnosis of SARS-CoV-2 by FDA under an Emergency Use Authorization (EUA). This EUA will remain  in effect (meaning this test can be used) for the duration of the COVID-19 declaration under Section 564(b)(1)  of the Act, 21 U.S.C. section 360bbb-3(b)(1), unless the authorization is terminated or revoked sooner.     Influenza A by PCR NEGATIVE NEGATIVE Final   Influenza B by PCR NEGATIVE NEGATIVE Final    Comment: (NOTE) The Xpert Xpress SARS-CoV-2/FLU/RSV assay is intended as an aid in  the diagnosis of influenza from Nasopharyngeal swab specimens and  should not be used as a sole basis for treatment. Nasal washings and  aspirates are unacceptable for Xpert Xpress SARS-CoV-2/FLU/RSV  testing.  Fact Sheet for Patients: https://www.moore.com/  Fact Sheet for Healthcare Providers: https://www.young.biz/  This test is not yet approved or cleared by the [place] FDA and  has been authorized for detection and/or diagnosis of SARS-CoV-2 by  FDA under an Emergency Use Authorization (EUA). This EUA will remain  in effect (meaning this test can be used) for the duration of the  Covid-19 declaration under Section 564(b)(1) of the Act, 21  U.S.C. section 360bbb-3(b)(1), unless the authorization is  terminated or revoked. Performed at [facility], [address] [place], [place] [address]          Radiology Studies: No  results found.    Scheduled Meds: . clonazePAM  1 mg Oral QHS  . finasteride  5 mg Oral Daily  . furosemide  40 mg Intravenous BID  . gabapentin  300 mg Oral q AM  . isosorbide mononitrate  30 mg Oral Daily  . metoprolol  200 mg Oral q AM  . mometasone-formoterol  2 puff Inhalation BID  . morphine  30 mg Oral Q12H  . pantoprazole  40 mg Oral QAC breakfast  . potassium chloride  20 mEq Oral Daily  . predniSONE  10 mg Oral Q breakfast  . ranolazine  500 mg Oral BID  . rivaroxaban  20 mg Oral Q supper  . rosuvastatin  10 mg Oral Daily  . sertraline  50 mg Oral q AM  . spironolactone  25 mg Oral Daily  . tamsulosin  0.4 mg Oral Daily  . traZODone  100 mg Oral QHS  . umeclidinium bromide  1 puff Inhalation Daily   Continuous Infusions:   LOS: 3 days      Calvert Cantor, MD [facility] Pager: [email or web address] 02/21/2020, 3:45 PM

## 2020-02-21 NOTE — Interval H&P Note (Signed)
History and Physical Interval Note:  02/21/2020 12:26 PM  Kevin Orozco  has presented today for surgery, with the diagnosis of afib.  The various methods of treatment have been discussed with the patient and family. After consideration of risks, benefits and other options for treatment, the patient has consented to  Procedure(s): CARDIOVERSION (N/A) as a surgical intervention.  The patient's history has been reviewed, patient examined, no change in status, stable for surgery.  I have reviewed the patient's chart and labs.  Questions were answered to the patient's satisfaction.     Yates Decamp

## 2020-02-21 NOTE — Progress Notes (Addendum)
Ok to check a hep C VL to confirm treatment per Dr. Butler Denmark.   Ulyses Southward, PharmD, BCIDP, AAHIVP, CPP Infectious Disease Pharmacist 02/21/2020 9:52 AM

## 2020-02-21 NOTE — Anesthesia Procedure Notes (Signed)
Procedure Name: General with mask airway Date/Time: 02/21/2020 12:40 PM Performed by: Izola Price., CRNA Pre-anesthesia Checklist: Patient identified, Emergency Drugs available, Suction available and Patient being monitored Patient Re-evaluated:Patient Re-evaluated prior to induction Oxygen Delivery Method: Ambu bag Preoxygenation: Pre-oxygenation with 100% oxygen

## 2020-02-21 NOTE — Progress Notes (Addendum)
Subjective:  Patient was very upset this morning that his cardioversion has not been done.  He is under the impression that cardioversion is going to completely resolve his breathing issues.  There is no documented urine output since his hospital admission.  He has not been able to urinate in the urinal because "the edge of the urinal is sharp".  There is no height kept in the toilet bowl to accurately monitor his urine output.  Daily weight count suggest that he has lost 5 kg since admission, which is questionable.  Objective:  Vital Signs in the last 24 hours: Temp:  [97.8 F (36.6 C)-97.9 F (36.6 C)] 97.9 F (36.6 C) (11/01 0609) Pulse Rate:  [76-103] 79 (11/01 0609) Resp:  [16-20] 16 (11/01 0609) BP: (109-119)/(71-89) 115/71 (11/01 0609) SpO2:  [91 %-96 %] 96 % (11/01 0609) Weight:  [83.5 kg] 83.5 kg (11/01 0214)  Intake/Output from previous day: 10/31 0701 - 11/01 0700 In: 240 [P.O.:240] Out: -   Physical Exam Vitals and nursing note reviewed.  Constitutional:      General: He is not in acute distress.    Appearance: He is well-developed.  HENT:     Head: Normocephalic and atraumatic.  Eyes:     Conjunctiva/sclera: Conjunctivae normal.     Pupils: Pupils are equal, round, and reactive to light.  Neck:     Vascular: No JVD.  Cardiovascular:     Rate and Rhythm: Normal rate. Rhythm irregular.     Pulses: Normal pulses and intact distal pulses.     Heart sounds: No murmur heard.   Pulmonary:     Effort: Pulmonary effort is normal.     Breath sounds: Examination of the right-lower field reveals rales. Rales present. No wheezing.  Abdominal:     General: Bowel sounds are normal.     Palpations: Abdomen is soft.     Tenderness: There is no rebound.  Musculoskeletal:        General: No tenderness. Normal range of motion.     Right lower leg: Edema (Trace) present.     Left lower leg: Edema (Trace) present.  Lymphadenopathy:     Cervical: No cervical adenopathy.    Skin:    General: Skin is warm and dry.  Neurological:     Mental Status: He is alert and oriented to person, place, and time.     Cranial Nerves: No cranial nerve deficit.      Lab Results: BMP Recent Labs    02/19/20 1138 02/20/20 0955 02/21/20 0521  NA 139 136 137  K 4.1 4.0 3.8  CL 104 104 102  CO2 26 26 24  GLUCOSE 117* 104* 89  BUN 6* 9 10  CREATININE 0.82 0.92 0.88  CALCIUM 8.9 9.0 9.0  GFRNONAA >60 >60 >60    CBC Recent Labs  Lab 02/18/20 1559  WBC 5.3  RBC 3.56*  HGB 11.6*  HCT 35.8*  PLT 150  MCV 100.6*  MCH 32.6  MCHC 32.4  RDW 14.0  LYMPHSABS 2.3  MONOABS 0.4  EOSABS 0.0  BASOSABS 0.0    HEMOGLOBIN A1C Lab Results  Component Value Date   HGBA1C 5.4 09/09/2019   MPG 97 02/22/2015    Cardiac Panel (last 3 results) No results for input(s): CKTOTAL, CKMB, TROPONINI, RELINDX in the last 8760 hours.  BNP (last 3 results) Recent Labs    02/18/20 1559 02/19/20 1138  BNP 307.4* 426.5*    TSH No results for input(s): TSH in the   last 8760 hours.  Lipid Panel     Component Value Date/Time   CHOL 125 04/20/2012 0500   TRIG 100 04/20/2012 0500   HDL 36 (L) 04/20/2012 0500   CHOLHDL 3.5 04/20/2012 0500   VLDL 20 04/20/2012 0500   LDLCALC 69 04/20/2012 0500    Cardiac Studies:  EKG 02/18/2020: Afib w/RVR 116 bpm Nonspecific ST-T changes  Echocardiogram 12/14/2019:  Left ventricle cavity is normal in size and wall thickness. Mildly  depressed LV systolic function with visual EF 45-50%. Normal global wall  motion. Diastolic function not assessed due to Afib.  Left atrial cavity is moderately dilated.  Moderate (Grade II) mitral regurgitation.  Mild to moderate tricuspid regurgitation. Estimated pulmonary artery  systolic pressure 23 mmHg.  Coronary angiography 2012: LM: Normal LAD: Ostial 40-50% stenosis, mid LAD 30% stenosis LCx: OM2 30% stenosis RCA: Mid 20% stenosis and posterior lateral branch   Assessment &  Recommendations:  64 y.o.male with hypertension, persistent Afib, moderate nonobstructive coronary artery disease (cath 2012), COPD, active smoker, chronic pain, prior h/o polysubstance abuse (EtOH, cocaine), now with acute heart failure, hypokalemia  Acute heart failure with mildly reduced LVEF: Echocardiogram in 11/2019 showed EF 45-50%. Clinically, he is in decompensated heart failure. BNP 307-->426 Need to strictly monitor daily weights and intake and output. Continue IV Lasix 40 mg twice daily, and spironolactone 25 mg daily for now. His shortness of breath is combination of heart failure, COPD, and atrial fibrillation.  See below regarding management of atrial fibrillation.  Afib: Likely persistent.  Resting heart rate controlled.   I remain skeptical how much improvement in her shortness of breath cardioversion would offer.  Nonetheless, patient is absolutely convinced that cardioversion is what he needs.  It is reasonable to proceed with cautious optimism.   Plan for cardioversion today at 12:30 PM.  CHA2DS2VASc score2, annual stroke risk2%. Continue Xarelto to 20 mg daily. Not on Aspirin due to ongoing use of Xarelto.  CAD: Clinical diagnosis, with stable angina. Continue current antianginal therapy. Unofrtunately, he does not want to quit smoking.  Hypertension: Well-controlled.  Chronic pain management: As per the primary team.   Elder Negus, MD Pager: [phone] Office: [phone]

## 2020-02-21 NOTE — H&P (View-Only) (Signed)
Subjective:  Patient was very upset this morning that his cardioversion has not been done.  He is under the impression that cardioversion is going to completely resolve his breathing issues.  There is no documented urine output since his hospital admission.  He has not been able to urinate in the urinal because "the edge of the urinal is sharp".  There is no height kept in the toilet bowl to accurately monitor his urine output.  Daily weight count suggest that he has lost 5 kg since admission, which is questionable.  Objective:  Vital Signs in the last 24 hours: Temp:  [97.8 F (36.6 C)-97.9 F (36.6 C)] 97.9 F (36.6 C) (11/01 0609) Pulse Rate:  [76-103] 79 (11/01 0609) Resp:  [16-20] 16 (11/01 0609) BP: (109-119)/(71-89) 115/71 (11/01 0609) SpO2:  [91 %-96 %] 96 % (11/01 0609) Weight:  [83.5 kg] 83.5 kg (11/01 0214)  Intake/Output from previous day: 10/31 0701 - 11/01 0700 In: 240 [P.O.:240] Out: -   Physical Exam Vitals and nursing note reviewed.  Constitutional:      General: He is not in acute distress.    Appearance: He is well-developed.  HENT:     Head: Normocephalic and atraumatic.  Eyes:     Conjunctiva/sclera: Conjunctivae normal.     Pupils: Pupils are equal, round, and reactive to light.  Neck:     Vascular: No JVD.  Cardiovascular:     Rate and Rhythm: Normal rate. Rhythm irregular.     Pulses: Normal pulses and intact distal pulses.     Heart sounds: No murmur heard.   Pulmonary:     Effort: Pulmonary effort is normal.     Breath sounds: Examination of the right-lower field reveals rales. Rales present. No wheezing.  Abdominal:     General: Bowel sounds are normal.     Palpations: Abdomen is soft.     Tenderness: There is no rebound.  Musculoskeletal:        General: No tenderness. Normal range of motion.     Right lower leg: Edema (Trace) present.     Left lower leg: Edema (Trace) present.  Lymphadenopathy:     Cervical: No cervical adenopathy.    Skin:    General: Skin is warm and dry.  Neurological:     Mental Status: He is alert and oriented to person, place, and time.     Cranial Nerves: No cranial nerve deficit.      Lab Results: BMP Recent Labs    02/19/20 1138 02/20/20 0955 02/21/20 0521  NA 139 136 137  K 4.1 4.0 3.8  CL 104 104 102  CO2 26 26 24   GLUCOSE 117* 104* 89  BUN 6* 9 10  CREATININE 0.82 0.92 0.88  CALCIUM 8.9 9.0 9.0  GFRNONAA >60 >60 >60    CBC Recent Labs  Lab 02/18/20 1559  WBC 5.3  RBC 3.56*  HGB 11.6*  HCT 35.8*  PLT 150  MCV 100.6*  MCH 32.6  MCHC 32.4  RDW 14.0  LYMPHSABS 2.3  MONOABS 0.4  EOSABS 0.0  BASOSABS 0.0    HEMOGLOBIN A1C Lab Results  Component Value Date   HGBA1C 5.4 09/09/2019   MPG 97 02/22/2015    Cardiac Panel (last 3 results) No results for input(s): CKTOTAL, CKMB, TROPONINI, RELINDX in the last 8760 hours.  BNP (last 3 results) Recent Labs    02/18/20 1559 02/19/20 1138  BNP 307.4* 426.5*    TSH No results for input(s): TSH in the  last 8760 hours.  Lipid Panel     Component Value Date/Time   CHOL 125 04/20/2012 0500   TRIG 100 04/20/2012 0500   HDL 36 (L) 04/20/2012 0500   CHOLHDL 3.5 04/20/2012 0500   VLDL 20 04/20/2012 0500   LDLCALC 69 04/20/2012 0500    Cardiac Studies:  EKG 02/18/2020: Afib w/RVR 116 bpm Nonspecific ST-T changes  Echocardiogram 12/14/2019:  Left ventricle cavity is normal in size and wall thickness. Mildly  depressed LV systolic function with visual EF 45-50%. Normal global wall  motion. Diastolic function not assessed due to Afib.  Left atrial cavity is moderately dilated.  Moderate (Grade II) mitral regurgitation.  Mild to moderate tricuspid regurgitation. Estimated pulmonary artery  systolic pressure 23 mmHg.  Coronary angiography 2012: LM: Normal LAD: Ostial 40-50% stenosis, mid LAD 30% stenosis LCx: OM2 30% stenosis RCA: Mid 20% stenosis and posterior lateral branch   Assessment &  Recommendations:  64 y.o.male with hypertension, persistent Afib, moderate nonobstructive coronary artery disease (cath 2012), COPD, active smoker, chronic pain, prior h/o polysubstance abuse (EtOH, cocaine), now with acute heart failure, hypokalemia  Acute heart failure with mildly reduced LVEF: Echocardiogram in 11/2019 showed EF 45-50%. Clinically, he is in decompensated heart failure. BNP 307-->426 Need to strictly monitor daily weights and intake and output. Continue IV Lasix 40 mg twice daily, and spironolactone 25 mg daily for now. His shortness of breath is combination of heart failure, COPD, and atrial fibrillation.  See below regarding management of atrial fibrillation.  Afib: Likely persistent.  Resting heart rate controlled.   I remain skeptical how much improvement in her shortness of breath cardioversion would offer.  Nonetheless, patient is absolutely convinced that cardioversion is what he needs.  It is reasonable to proceed with cautious optimism.   Plan for cardioversion today at 12:30 PM.  CHA2DS2VASc score2, annual stroke risk2%. Continue Xarelto to 20 mg daily. Not on Aspirin due to ongoing use of Xarelto.  CAD: Clinical diagnosis, with stable angina. Continue current antianginal therapy. Unofrtunately, he does not want to quit smoking.  Hypertension: Well-controlled.  Chronic pain management: As per the primary team.   Elder Negus, MD Pager: [phone] Office: [phone]

## 2020-02-21 NOTE — Anesthesia Preprocedure Evaluation (Signed)
Anesthesia Evaluation  Patient identified by MRN, date of birth, ID band Patient awake    Reviewed: Patient's Chart, lab work & pertinent test results  Airway Mallampati: II  TM Distance: >3 FB     Dental  (+) Poor Dentition   Pulmonary COPD, Current Smoker,    Pulmonary exam normal breath sounds clear to auscultation       Cardiovascular hypertension, + CAD and +CHF  + dysrhythmias (on pradaxa) Atrial Fibrillation  Rhythm:Regular Rate:Normal  EKG: afib   Neuro/Psych PSYCHIATRIC DISORDERS    GI/Hepatic (+)     substance abuse  cocaine use, Hepatitis -, C  Endo/Other  diabetes  Renal/GU Renal InsufficiencyRenal disease  negative genitourinary   Musculoskeletal negative musculoskeletal ROS (+)   Abdominal Normal abdominal exam  (+)   Peds  Hematology  (+) anemia ,   Anesthesia Other Findings   Reproductive/Obstetrics negative OB ROS                            Anesthesia Physical Anesthesia Plan  ASA: III  Anesthesia Plan: General   Post-op Pain Management:    Induction:   PONV Risk Score and Plan: 1 and Propofol infusion, TIVA and Treatment may vary due to age or medical condition  Airway Management Planned: Natural Airway and Nasal Cannula  Additional Equipment:   Intra-op Plan:   Post-operative Plan:   Informed Consent: I have reviewed the patients History and Physical, chart, labs and discussed the procedure including the risks, benefits and alternatives for the proposed anesthesia with the patient or authorized representative who has indicated his/her understanding and acceptance.       Plan Discussed with: Anesthesiologist and CRNA  Anesthesia Plan Comments:        Anesthesia Quick Evaluation

## 2020-02-21 NOTE — Transfer of Care (Signed)
Immediate Anesthesia Transfer of Care Note  Patient: Kevin Orozco  Procedure(s) Performed: CARDIOVERSION (N/A )  Patient Location: Endoscopy Unit  Anesthesia Type:General  Level of Consciousness: awake, alert  and oriented  Airway & Oxygen Therapy: Patient Spontanous Breathing and Patient connected to nasal cannula oxygen  Post-op Assessment: Report given to RN and Post -op Vital signs reviewed and stable  Post vital signs: Reviewed and stable  Last Vitals:  Vitals Value Taken Time  BP 81/60 02/21/20 1241  Temp    Pulse 62 02/21/20 1242  Resp 22 02/21/20 1242  SpO2 95 % 02/21/20 1242  Vitals shown include unvalidated device data.  Last Pain:  Vitals:   02/21/20 1204  TempSrc: Temporal  PainSc: 0-No pain      Patients Stated Pain Goal: 6 (02/20/20 1630)  Complications: No complications documented.

## 2020-02-21 NOTE — Anesthesia Postprocedure Evaluation (Signed)
Anesthesia Post Note  Patient: Kevin Orozco  Procedure(s) Performed: CARDIOVERSION (N/A )     Patient location during evaluation: PACU Anesthesia Type: General Level of consciousness: sedated Pain management: pain level controlled Vital Signs Assessment: post-procedure vital signs reviewed and stable Respiratory status: spontaneous breathing and respiratory function stable Cardiovascular status: stable Postop Assessment: no apparent nausea or vomiting Anesthetic complications: no   No complications documented.  Last Vitals:  Vitals:   02/21/20 1253 02/21/20 1301  BP: 100/69 110/66  Pulse: 64 (!) 59  Resp: (!) 21 16  Temp:    SpO2: 95% 95%    Last Pain:  Vitals:   02/21/20 1318  TempSrc:   PainSc: 5                  Candra R Trevaris Pennella

## 2020-02-21 NOTE — CV Procedure (Signed)
Direct current cardioversion: 02/21/20   Indication symptomatic A. Fibrillation.  Procedure: Using 60 mg of IV Propofol and 80 IV Lidocaine (for reducing venous pain) for achieving deep sedation, synchronized direct current cardioversion performed. Patient was delivered with 150 Joules of electricity X 1 with success to NSR. Patient tolerated the procedure well. No immediate complication noted.    Yates Decamp, MD, [facility] 02/21/2020, 12:31 PM Office: [phone] Pager: [phone]

## 2020-02-21 NOTE — Progress Notes (Signed)
Patient returned from Endo.  

## 2020-02-22 LAB — BASIC METABOLIC PANEL
Anion gap: 9 (ref 5–15)
BUN: 17 mg/dL (ref 8–23)
CO2: 26 mmol/L (ref 22–32)
Calcium: 8.9 mg/dL (ref 8.9–10.3)
Chloride: 100 mmol/L (ref 98–111)
Creatinine, Ser: 1.06 mg/dL (ref 0.61–1.24)
GFR, Estimated: >60 mL/min (ref >60)
Glucose, Bld: 103 mg/dL — ABNORMAL HIGH (ref 70–99)
Potassium: 4.4 mmol/L (ref 3.5–5.1)
Sodium: 135 mmol/L (ref 135–145)

## 2020-02-22 LAB — HCV RNA QUANT
HCV Quantitative Log: 3.795 log10 IU/mL (ref >1.70)
HCV Quantitative: 6240 IU/mL (ref >50)

## 2020-02-22 MED ORDER — SPIRONOLACTONE 25 MG PO TABS
25.0000 mg | ORAL_TABLET | Freq: Every day | ORAL | 0 refills | Status: DC
Start: 2020-02-23 — End: 2021-06-26

## 2020-02-22 MED ORDER — FUROSEMIDE 40 MG PO TABS: 40.0000 mg | ORAL_TABLET | Freq: Every day | ORAL | 0 refills | Status: DC | PRN

## 2020-02-22 NOTE — Progress Notes (Signed)
Subjective:  Successful cardioversion 11/1. Still doe snot think breathing is back to normal, but wants to go home.  O2 sat has been in high 90s all along  Objective:  Vital Signs in the last 24 hours: Temp:  [96.8 F (36 C)-97.8 F (36.6 C)] 97.6 F (36.4 C) (11/02 0325) Pulse Rate:  [54-104] 64 (11/02 0325) Resp:  [13-21] 16 (11/02 0325) BP: (81-130)/(60-93) 98/70 (11/02 0334) SpO2:  [92 %-98 %] 97 % (11/02 0735) Weight:  [84.2 kg-85.3 kg] 84.2 kg (11/02 0319)  Intake/Output from previous day: 11/01 0701 - 11/02 0700 In: 1192 [P.O.:942; I.V.:250] Out: 2450 [Urine:2450]  Physical Exam Vitals and nursing note reviewed.  Constitutional:      General: He is not in acute distress.    Appearance: He is well-developed.  HENT:     Head: Normocephalic and atraumatic.  Eyes:     Conjunctiva/sclera: Conjunctivae normal.     Pupils: Pupils are equal, round, and reactive to light.  Neck:     Vascular: No JVD.  Cardiovascular:     Rate and Rhythm: Normal rate and regular rhythm.     Pulses: Normal pulses and intact distal pulses.     Heart sounds: No murmur heard.   Pulmonary:     Effort: Pulmonary effort is normal.     Breath sounds: No wheezing or rales.  Abdominal:     General: Bowel sounds are normal.     Palpations: Abdomen is soft.     Tenderness: There is no rebound.  Musculoskeletal:        General: No tenderness. Normal range of motion.     Right lower leg: No edema.     Left lower leg: No edema.  Lymphadenopathy:     Cervical: No cervical adenopathy.  Skin:    General: Skin is warm and dry.  Neurological:     Mental Status: He is alert and oriented to person, place, and time.     Cranial Nerves: No cranial nerve deficit.      Lab Results: BMP Recent Labs    02/20/20 0955 02/21/20 0521 02/22/20 0313  NA 136 137 135  K 4.0 3.8 4.4  CL 104 102 100  CO2 26 24 26   GLUCOSE 104* 89 103*  BUN 9 10 17   CREATININE 0.92 0.88 1.06  CALCIUM 9.0 9.0 8.9    GFRNONAA >60 >60 >60    CBC Recent Labs  Lab 02/18/20 1559  WBC 5.3  RBC 3.56*  HGB 11.6*  HCT 35.8*  PLT 150  MCV 100.6*  MCH 32.6  MCHC 32.4  RDW 14.0  LYMPHSABS 2.3  MONOABS 0.4  EOSABS 0.0  BASOSABS 0.0    HEMOGLOBIN A1C Lab Results  Component Value Date   HGBA1C 5.4 09/09/2019   MPG 97 02/22/2015    Cardiac Panel (last 3 results) No results for input(s): CKTOTAL, CKMB, TROPONINI, RELINDX in the last 8760 hours.  BNP (last 3 results) Recent Labs    02/18/20 1559 02/19/20 1138  BNP 307.4* 426.5*    TSH No results for input(s): TSH in the last 8760 hours.  Lipid Panel     Component Value Date/Time   CHOL 125 04/20/2012 0500   TRIG 100 04/20/2012 0500   HDL 36 (L) 04/20/2012 0500   CHOLHDL 3.5 04/20/2012 0500   VLDL 20 04/20/2012 0500   LDLCALC 69 04/20/2012 0500    Cardiac Studies:  EKG 02/18/2020: Afib w/RVR 116 bpm Nonspecific ST-T changes  Echocardiogram 12/14/2019:  Left  ventricle cavity is normal in size and wall thickness. Mildly  depressed LV systolic function with visual EF 45-50%. Normal global wall  motion. Diastolic function not assessed due to Afib.  Left atrial cavity is moderately dilated.  Moderate (Grade II) mitral regurgitation.  Mild to moderate tricuspid regurgitation. Estimated pulmonary artery  systolic pressure 23 mmHg.  Coronary angiography 2012: LM: Normal LAD: Ostial 40-50% stenosis, mid LAD 30% stenosis LCx: OM2 30% stenosis RCA: Mid 20% stenosis and posterior lateral branch   Assessment & Recommendations:  64 y.o.male with hypertension, persistent Afib, moderate nonobstructive coronary artery disease (cath 2012), COPD, active smoker, chronic pain, prior h/o polysubstance abuse (EtOH, cocaine), now with acute heart failure, hypokalemia  Acute heart failure with mildly reduced LVEF: Echocardiogram in 11/2019 showed EF 45-50%. Clinically, he is in decompensated heart failure. BNP 307-->426 Clinically  euvolumic today. His shortness of breath is combination of heart failure, COPD, and atrial fibrillation, of which, heart failure decompensation and Afib now resolved. Recommend discharge home on PO spironolactone 25 mg daily and PO lasix 40 mg daily prn for leg edema  Afib: Successful cardioversion 11/1. CHA2DS2VASc score2, annual stroke risk2%. Continue Xarelto to 20 mg daily. Not on Aspirin due to ongoing use of Xarelto.  CAD: Clinical diagnosis, with stable angina. Continue current antianginal therapy. Unofrtunately, he does not want to quit smoking.  Hypertension: Well-controlled.  Chronic pain management: As per the primary team.  Will sing off and arrange outpatient follow up.   Elder Negus, MD Pager: [phone] Office: [phone]

## 2020-02-22 NOTE — Discharge Instructions (Signed)
You were cared for by a hospitalist during your hospital stay. If you have any questions about your discharge medications or the care you received while you were in the hospital after you are discharged, you can call the unit and asked to speak with the hospitalist on call if the hospitalist that took care of you is not available. Once you are discharged, your primary care physician will handle any further medical issues.   Please note that NO REFILLS for any discharge medications will be authorized once you are discharged, as it is imperative that you return to your primary care physician (or establish a relationship with a primary care physician if you do not have one) for your aftercare needs so that they can reassess your need for medications and monitor your lab values.  Please take all your medications with you for your next visit with your Primary MD. Please ask your Primary MD to get all Hospital records sent to his/her office. Please request your Primary MD to go over all hospital test results at the follow up.   If you experience worsening of your admission symptoms, develop shortness of breath, chest pain, suicidal or homicidal thoughts or a life threatening emergency, you must seek medical attention immediately by calling 911 or calling your MD.   Bonita Quin must read the complete instructions/literature along with all the possible adverse reactions/side effects for all the medicines you take including new medications that have been prescribed to you. Take new medicines after you have completely understood and accpet all the possible adverse reactions/side effects.    Do not drive when taking pain medications or sedatives.     Do not take more than prescribed Pain, Sleep and Anxiety Medications   If you have smoked or chewed Tobacco in the last 2 yrs please stop. Stop any regular alcohol  and or recreational drug use.   Wear Seat belts while driving.    Low-Sodium Eating Plan Sodium,  which is an element that makes up salt, helps you maintain a healthy balance of fluids in your body. Too much sodium can increase your blood pressure and cause fluid and waste to be held in your body. Your health care provider or dietitian may recommend following this plan if you have high blood pressure (hypertension), kidney disease, liver disease, or heart failure. Eating less sodium can help lower your blood pressure, reduce swelling, and protect your heart, liver, and kidneys. What are tips for following this plan? General guidelines  Most people on this plan should limit their sodium intake to 1,500-2,000 mg (milligrams) of sodium each day. Reading food labels   The Nutrition Facts label lists the amount of sodium in one serving of the food. If you eat more than one serving, you must multiply the listed amount of sodium by the number of servings.  Choose foods with less than 140 mg of sodium per serving.  Avoid foods with 300 mg of sodium or more per serving. Shopping  Look for lower-sodium products, often labeled as "low-sodium" or "no salt added."  Always check the sodium content even if foods are labeled as "unsalted" or "no salt added".  Buy fresh foods. ? Avoid canned foods and premade or frozen meals. ? Avoid canned, cured, or processed meats  Buy breads that have less than 80 mg of sodium per slice. Cooking  Eat more home-cooked food and less restaurant, buffet, and fast food.  Avoid adding salt when cooking. Use salt-free seasonings or herbs instead of table  salt or sea salt. Check with your health care provider or pharmacist before using salt substitutes.  Cook with plant-based oils, such as canola, sunflower, or olive oil. Meal planning  When eating at a restaurant, ask that your food be prepared with less salt or no salt, if possible.  Avoid foods that contain MSG (monosodium glutamate). MSG is sometimes added to [place] food, bouillon, and some canned  foods. What foods are recommended? The items listed may not be a complete list. Talk with your dietitian about what dietary choices are best for you. Grains Low-sodium cereals, including oats, puffed wheat and rice, and shredded wheat. Low-sodium crackers. Unsalted rice. Unsalted pasta. Low-sodium bread. Whole-grain breads and whole-grain pasta. Vegetables Fresh or frozen vegetables. "No salt added" canned vegetables. "No salt added" tomato sauce and paste. Low-sodium or reduced-sodium tomato and vegetable juice. Fruits Fresh, frozen, or canned fruit. Fruit juice. Meats and other protein foods Fresh or frozen (no salt added) meat, poultry, seafood, and fish. Low-sodium canned tuna and salmon. Unsalted nuts. Dried peas, beans, and lentils without added salt. Unsalted canned beans. Eggs. Unsalted nut butters. Dairy Milk. Soy milk. Cheese that is naturally low in sodium, such as ricotta cheese, fresh mozzarella, or Swiss cheese Low-sodium or reduced-sodium cheese. Cream cheese. Yogurt. Fats and oils Unsalted butter. Unsalted margarine with no trans fat. Vegetable oils such as canola or olive oils. Seasonings and other foods Fresh and dried herbs and spices. Salt-free seasonings. Low-sodium mustard and ketchup. Sodium-free salad dressing. Sodium-free light mayonnaise. Fresh or refrigerated horseradish. Lemon juice. Vinegar. Homemade, reduced-sodium, or low-sodium soups. Unsalted popcorn and pretzels. Low-salt or salt-free chips. What foods are not recommended? The items listed may not be a complete list. Talk with your dietitian about what dietary choices are best for you. Grains Instant hot cereals. Bread stuffing, pancake, and biscuit mixes. Croutons. Seasoned rice or pasta mixes. Noodle soup cups. Boxed or frozen macaroni and cheese. Regular salted crackers. Self-rising flour. Vegetables Sauerkraut, pickled vegetables, and relishes. Olives. [place] fries. Onion rings. Regular canned vegetables  (not low-sodium or reduced-sodium). Regular canned tomato sauce and paste (not low-sodium or reduced-sodium). Regular tomato and vegetable juice (not low-sodium or reduced-sodium). Frozen vegetables in sauces. Meats and other protein foods Meat or fish that is salted, canned, smoked, spiced, or pickled. Bacon, ham, sausage, hotdogs, corned beef, chipped beef, packaged lunch meats, salt pork, jerky, pickled herring, anchovies, regular canned tuna, sardines, salted nuts. Dairy Processed cheese and cheese spreads. Cheese curds. Blue cheese. Feta cheese. String cheese. Regular cottage cheese. Buttermilk. Canned milk. Fats and oils Salted butter. Regular margarine. Ghee. Bacon fat. Seasonings and other foods Onion salt, garlic salt, seasoned salt, table salt, and sea salt. Canned and packaged gravies. Worcestershire sauce. Tartar sauce. Barbecue sauce. Teriyaki sauce. Soy sauce, including reduced-sodium. Steak sauce. Fish sauce. Oyster sauce. Cocktail sauce. Horseradish that you find on the shelf. Regular ketchup and mustard. Meat flavorings and tenderizers. Bouillon cubes. Hot sauce and Tabasco sauce. Premade or packaged marinades. Premade or packaged taco seasonings. Relishes. Regular salad dressings. Salsa. Potato and tortilla chips. Corn chips and puffs. Salted popcorn and pretzels. Canned or dried soups. Pizza. Frozen entrees and pot pies. Summary  Eating less sodium can help lower your blood pressure, reduce swelling, and protect your heart, liver, and kidneys.  Most people on this plan should limit their sodium intake to 1,500-2,000 mg (milligrams) of sodium each day.  Canned, boxed, and frozen foods are high in sodium. Restaurant foods, fast foods, and pizza are also  very high in sodium. You also get sodium by adding salt to food.  Try to cook at home, eat more fresh fruits and vegetables, and eat less fast food, canned, processed, or prepared foods. This information is not intended to replace  advice given to you by your health care provider. Make sure you discuss any questions you have with your health care provider. Document Revised: 03/21/2017 Document Reviewed: 04/01/2016 Elsevier Patient Education  2020 [employer]. Information on my medicine - XARELTO (Rivaroxaban)  This medication education was reviewed with me or my healthcare representative as part of my discharge preparation.  The pharmacist that spoke with me during my hospital stay was:  Ulyses Southward, RPH-CPP  Why was Xarelto prescribed for you? Xarelto was prescribed for you to reduce the risk of a blood clot forming that can cause a stroke if you have a medical condition called atrial fibrillation (a type of irregular heartbeat).  What do you need to know about xarelto ? Take your Xarelto ONCE DAILY at the same time every day with your evening meal. If you have difficulty swallowing the tablet whole, you may crush it and mix in applesauce just prior to taking your dose.  Take Xarelto exactly as prescribed by your doctor and DO NOT stop taking Xarelto without talking to the doctor who prescribed the medication.  Stopping without other stroke prevention medication to take the place of Xarelto may increase your risk of developing a clot that causes a stroke.  Refill your prescription before you run out.  After discharge, you should have regular check-up appointments with your healthcare provider that is prescribing your Xarelto.  In the future your dose may need to be changed if your kidney function or weight changes by a significant amount.  What do you do if you miss a dose? If you are taking Xarelto ONCE DAILY and you miss a dose, take it as soon as you remember on the same day then continue your regularly scheduled once daily regimen the next day. Do not take two doses of Xarelto at the same time or on the same day.   Important Safety Information A possible side effect of Xarelto is bleeding. You should call  your healthcare provider right away if you experience any of the following: ? Bleeding from an injury or your nose that does not stop. ? Unusual colored urine (red or dark brown) or unusual colored stools (red or black). ? Unusual bruising for unknown reasons. ? A serious fall or if you hit your head (even if there is no bleeding).  Some medicines may interact with Xarelto and might increase your risk of bleeding while on Xarelto. To help avoid this, consult your healthcare provider or pharmacist prior to using any new prescription or non-prescription medications, including herbals, vitamins, non-steroidal anti-inflammatory drugs (NSAIDs) and supplements.  This website has more information on Xarelto: [email or web address].br.

## 2020-02-22 NOTE — Progress Notes (Signed)
Patient aware of discharge orders and will call his ride once the discharge RN has completed his DC instructions. Patient given additional smoking cessation instructions and importance of medication compliance to avoid readmission. Patient verbalizes understanding

## 2020-02-22 NOTE — Progress Notes (Signed)
  Mobility Specialist Criteria Algorithm Info.  SATURATION QUALIFICATIONS: (This note is used to comply with regulatory documentation for home oxygen)  Patient Saturations on Room Air at Rest = 98%  Patient Saturations on Room Air while Ambulating = 93%  Patient Saturations on 0 Liters of oxygen while Ambulating = N/A%  Please briefly explain why patient needs home oxygen:  Mobility Team:  [facility] elevated:Self regulated Activity: Ambulated in hall;Dangled on edge of bed Range of motion: Active;All extremities Level of assistance: Independent Assistive device: None Minutes sitting in chair:  Minutes stood: 5 minutes Minutes ambulated: 5 minutes Distance ambulated (ft): 300 ft Mobility response: Tolerated well Bed Position: Semi-fowlers  Pt eager to participate in mobility as he is to be discharged soon. Ambulated in hallway independently w/steady gait and without any complaints.   02/22/2020 10:37 AM

## 2020-02-22 NOTE — Plan of Care (Signed)

## 2020-02-22 NOTE — Progress Notes (Signed)
D/C instructions given and reviewed. No questions asked but encouraged to call with any concerns. Tele and IV removed. Tolerated well. 

## 2020-02-22 NOTE — Discharge Summary (Signed)
Physician Discharge Summary  Kevin Orozco RWE:[id number] DOB: November 26, 1955 DOA: 02/18/2020  PCP: [name], NP  Admit date: 02/18/2020 Discharge date: 02/22/2020  Admitted From: home  Disposition:  home   Recommendations for Outpatient Follow-up:  1. F/u BP/ HR and Bmet on current medications in 1 wk 2. Wean off Prednisone if able  Home Health:  none  Discharge Condition:  stable   CODE STATUS:  Full code   Diet recommendation:  Heart healthy Consultations:  cardiology  Procedures/Studies: . cardioversion   Discharge Diagnoses:  Principal Problem:   Acute on chronic systolic CHF (congestive heart failure) ([place]) Active Problems:   Atrial fibrillation with RVR (HCC)   CHF (congestive heart failure) ([place])   Essential hypertension   Hypokalemia  Nicotine abuse    Brief Summary: Kevin Orozco is a 64 y.o.malewithhistory of systolic CHF, A. fib, COPD, macrocytic anemia, chronic pain presents to the ER because of increasing shortness of breath over the last 1 week with pedal edema. Shortness of breath happens mostly on exertion. Has been having at times chest pressure retrosternal.   In ED > CXR showing pulmonary edema   Hospital Course:    CHF- systolic, acute on chronic - EF 45-50% - has been diuresed- no hypoxia on exertion - will d/c home with Spironolactone 25 mg daily and PRN Lasix (no K added at this point) - please f/u labs - discussed daily weights, 2 gm sodium diet and advised not to drink too much fluids   Hypokalemia - K 2.8- related to Lasix use in hospital - has been replaced and is normal now   Atrial fibrillation with RVR (HCC) - HR was low 100s - he felt symptomatic/ tachycardic - CHA2DS2-VASc Score 2 - on Metoprolol and Xarelto - cardioverted on 11/2 an now sinus rhythm in 60s- 50s when asleep - d/c'd oral Cardizem  Stable Angina/ CAD -cont Imdur & Ranexa - Xarelto being used instead of Aspirin  Chronic back  pain - cont home dose  of MS contin and Oxycodone   Macrocytic anemia - apparently this is chronic  Nicotine abuse - not interested in quitting at this point  Prior h/o Cocaine and alcohol abuse  Of note, patient takes Prednisone 10 mg daily for the past couple of months and states his nurse practitioner gave it to him but he is not sure what it is for.    Discharge Exam: Vitals:   02/22/20 0810 02/22/20 0900  BP: 110/83 112/84  Pulse: 66 67  Resp: 18 20  Temp:    SpO2: 95% 96%   Vitals:   02/22/20 0334 02/22/20 0735 02/22/20 0810 02/22/20 0900  BP: 98/70  110/83 112/84  Pulse:   66 67  Resp:   18 20  Temp:      TempSrc:      SpO2: 97% 97% 95% 96%  Weight:      Height:        General: Pt is alert, awake, not in acute distress Cardiovascular: RRR, S1/S2 +, no rubs, no gallops Respiratory: CTA bilaterally, no wheezing, no rhonchi Abdominal: Soft, NT, ND, bowel sounds + Extremities: no edema, no cyanosis   Discharge Instructions  Discharge Instructions    (HEART FAILURE PATIENTS) Call MD:  Anytime you have any of the following symptoms: 1) 3 pound weight gain in 24 hours or 5 pounds in 1 week 2) shortness of breath, with or without a dry hacking cough 3) swelling in the hands, feet or stomach 4) if you  have to sleep on extra pillows at night in order to breathe.   Complete by: As directed    Diet - low sodium heart healthy   Complete by: As directed    Increase activity slowly   Complete by: As directed      Allergies as of 02/22/2020   No Known Allergies     Medication List    STOP taking these medications   diltiazem 180 MG 24 hr capsule Commonly known as: CARDIZEM CD     TAKE these medications   clonazePAM 0.5 MG tablet Commonly known as: KLONOPIN Take 0.5 mg by mouth See admin instructions. Take 1 mg by mouth at bedtime and an additional 0.5 mg two tmes a day as needed for anxiety   diclofenac sodium 1 % Gel Commonly known as: VOLTAREN Apply 2 g topically 4 (four)  times daily as needed (for pain).   finasteride 5 MG tablet Commonly known as: PROSCAR Take 5 mg by mouth daily.   Fluticasone-Salmeterol 250-50 MCG/DOSE Aepb Commonly known as: ADVAIR Inhale 1 puff into the lungs 2 (two) times daily.   furosemide 40 MG tablet Commonly known as: Lasix Take 1 tablet (40 mg total) by mouth daily as needed. For fluid weight gain-   gabapentin 300 MG capsule Commonly known as: NEURONTIN Take 300 mg by mouth in the morning.   hydrochlorothiazide 25 MG tablet Commonly known as: HYDRODIURIL Take 1 tablet (25 mg total) by mouth daily.   ipratropium-albuterol 0.5-2.5 (3) MG/3ML Soln Commonly known as: DUONEB Take 3 mLs by nebulization daily as needed (for shortness of breath or wheezing).   isosorbide mononitrate 30 MG 24 hr tablet Commonly known as: IMDUR Take 1 tablet (30 mg total) by mouth daily.   metoprolol 200 MG 24 hr tablet Commonly known as: TOPROL-XL Take 200 mg by mouth in the morning.   morphine 30 MG 12 hr tablet Commonly known as: MS CONTIN Take 30 mg by mouth See admin instructions. Take 30 mg by mouth at 8 AM and 8 PM   naloxone 4 MG/0.1ML Liqd nasal spray kit Commonly known as: NARCAN Place 1 spray into the nose once as needed (AS DIRECTED).   nitroGLYCERIN 0.4 MG SL tablet Commonly known as: NITROSTAT Place under the tongue every 5 (five) minutes as needed for chest pain.   oxyCODONE-acetaminophen 10-325 MG tablet Commonly known as: PERCOCET Take 1 tablet by mouth 4 (four) times daily as needed for pain.   pantoprazole 40 MG tablet Commonly known as: PROTONIX Take 40 mg by mouth daily before breakfast.   predniSONE 10 MG tablet Commonly known as: DELTASONE Take 10 mg by mouth daily with breakfast.   ProAir HFA 108 (90 Base) MCG/ACT inhaler Generic drug: albuterol Inhale 2 puffs into the lungs every 6 (six) hours as needed for wheezing or shortness of breath.   ranolazine 500 MG 12 hr tablet Commonly known as:  RANEXA TAKE ONE TABLET BY MOUTH TWICE DAILY   rivaroxaban 20 MG Tabs tablet Commonly known as: XARELTO Take 1 tablet (20 mg total) by mouth daily with supper.   rosuvastatin 10 MG tablet Commonly known as: CRESTOR Take 1 tablet (10 mg total) by mouth daily.   sertraline 50 MG tablet Commonly known as: ZOLOFT Take 50 mg by mouth in the morning.   Spiriva HandiHaler 18 MCG inhalation capsule Generic drug: tiotropium Place 18 mcg into inhaler and inhale daily.   spironolactone 25 MG tablet Commonly known as: ALDACTONE Take 1 tablet (25  mg total) by mouth daily. Start taking on: February 23, 2020   tamsulosin 0.4 MG Caps capsule Commonly known as: FLOMAX Take 0.4 mg by mouth daily.   traZODone 100 MG tablet Commonly known as: DESYREL Take 100 mg by mouth at bedtime.       Follow-up Information    [name], NP. Schedule an appointment as soon as possible for a visit in 1 week(s).   Specialty: Nurse Practitioner Contact information: [facility] [place] [address] [phone]        [name], MD Follow up.   Specialties: Cardiology, Radiology Why: i n1-2 wks Contact information: [place] [address] [phone]              No Known Allergies    DG Chest Portable 1 View  Result Date: 02/18/2020 CLINICAL DATA:  Shortness of breath, chest pain. Additional history provided: Chest pain and atrial fibrillation, history of COPD and smoking. EXAM: PORTABLE CHEST 1 VIEW COMPARISON:  Prior chest radiographs 02/23/2015 and earlier. FINDINGS: Cardiomegaly. Central pulmonary vascular congestion. Prominence of the interstitial lung markings likely reflects interstitial edema. Small right greater than left pleural effusions with associated right basilar compressive atelectasis. No evidence of pneumothorax. No acute bony abnormality identified. IMPRESSION: Cardiomegaly with  interstitial edema and small right greater than left pleural effusions. Associated right basilar compressive atelectasis. Electronically Signed   By: Kellie Simmering DO   On: 02/18/2020 15:44     The results of significant diagnostics from this hospitalization (including imaging, microbiology, ancillary and laboratory) are listed below for reference.     Microbiology: Recent Results (from the past 240 hour(s))  Respiratory Panel by RT PCR (Flu A&B, Covid) - Nasopharyngeal Swab     Status: None   Collection Time: 02/18/20  7:24 PM   Specimen: Nasopharyngeal Swab  Result Value Ref Range Status   SARS Coronavirus 2 by RT PCR NEGATIVE NEGATIVE Final    Comment: (NOTE) SARS-CoV-2 target nucleic acids are NOT DETECTED.  The SARS-CoV-2 RNA is generally detectable in upper respiratoy specimens during the acute phase of infection. The lowest concentration of SARS-CoV-2 viral copies this assay can detect is 131 copies/mL. A negative result does not preclude SARS-Cov-2 infection and should not be used as the sole basis for treatment or other patient management decisions. A negative result may occur with  improper specimen collection/handling, submission of specimen other than nasopharyngeal swab, presence of viral mutation(s) within the areas targeted by this assay, and inadequate number of viral copies (<131 copies/mL). A negative result must be combined with clinical observations, patient history, and epidemiological information. The expected result is Negative.  Fact Sheet for Patients:  PinkCheek.be  Fact Sheet for Healthcare Providers:  GravelBags.it  This test is no t yet approved or cleared by the [place] FDA and  has been authorized for detection and/or diagnosis of SARS-CoV-2 by FDA under an Emergency Use Authorization (EUA). This EUA will remain  in effect (meaning this test can be used) for the duration of the COVID-19  declaration under Section 564(b)(1) of the Act, 21 U.S.C. section 360bbb-3(b)(1), unless the authorization is terminated or revoked sooner.     Influenza A by PCR NEGATIVE NEGATIVE Final   Influenza B by PCR NEGATIVE NEGATIVE Final    Comment: (NOTE) The Xpert Xpress SARS-CoV-2/FLU/RSV assay is intended as an aid in  the diagnosis of influenza from Nasopharyngeal swab specimens and  should not be used as a sole basis for treatment. Nasal washings and  aspirates are unacceptable for Xpert Xpress SARS-CoV-2/FLU/RSV  testing.  Fact Sheet for Patients: PinkCheek.be  Fact Sheet for Healthcare Providers: GravelBags.it  This test is not yet approved or cleared by the [place] FDA and  has been authorized for detection and/or diagnosis of SARS-CoV-2 by  FDA under an Emergency Use Authorization (EUA). This EUA will remain  in effect (meaning this test can be used) for the duration of the  Covid-19 declaration under Section 564(b)(1) of the Act, 21  U.S.C. section 360bbb-3(b)(1), unless the authorization is  terminated or revoked. Performed at [facility], [place] [address], [place], [place] [address]      Labs: BNP (last 3 results) Recent Labs    02/18/20 1559 02/19/20 1138  BNP 307.4* 782.4*   Basic Metabolic Panel: Recent Labs  Lab 02/18/20 1559 02/19/20 1138 02/19/20 1631 02/20/20 0955 02/21/20 0521 02/22/20 0313  NA 137 139  --  136 137 135  K 2.8* 4.1  --  4.0 3.8 4.4  CL 102 104  --  104 102 100  CO2 25 26  --  _0 GLUCOSE 112* 117*  --  104* 89 103*  BUN <5* 6*  --  _1 CREATININE 0.69 0.82  --  0.92 0.88 1.06  CALCIUM 8.5* 8.9  --  9.0 9.0 8.9  MG 1.8  --  2.1  --   --   --    Liver Function Tests: No results for input(s): AST, ALT, ALKPHOS, BILITOT, PROT, ALBUMIN in the last 168 hours. No results for input(s): LIPASE, AMYLASE in the last 168 hours. No results for input(s):  AMMONIA in the last 168 hours. CBC: Recent Labs  Lab 02/18/20 1559  WBC 5.3  NEUTROABS 2.5  HGB 11.6*  HCT 35.8*  MCV 100.6*  PLT 150   Cardiac Enzymes: No results for input(s): CKTOTAL, CKMB, CKMBINDEX, TROPONINI in the last 168 hours. BNP: Invalid input(s): POCBNP CBG: No results for input(s): GLUCAP in the last 168 hours. D-Dimer No results for input(s): DDIMER in the last 72 hours. Hgb A1c No results for input(s): HGBA1C in the last 72 hours. Lipid Profile No results for input(s): CHOL, HDL, LDLCALC, TRIG, CHOLHDL, LDLDIRECT in the last 72 hours. Thyroid function studies No results for input(s): TSH, T4TOTAL, T3FREE, THYROIDAB in the last 72 hours.  Invalid input(s): FREET3 Anemia work up No results for input(s): VITAMINB12, FOLATE, FERRITIN, TIBC, IRON, RETICCTPCT in the last 72 hours. Urinalysis No results found for: COLORURINE, APPEARANCEUR, [place], [place], [place], [place], [place], [place], PROTEINUR, UROBILINOGEN, NITRITE, LEUKOCYTESUR Sepsis Labs Invalid input(s): PROCALCITONIN,  WBC,  LACTICIDVEN Microbiology Recent Results (from the past 240 hour(s))  Respiratory Panel by RT PCR (Flu A&B, Covid) - Nasopharyngeal Swab     Status: None   Collection Time: 02/18/20  7:24 PM   Specimen: Nasopharyngeal Swab  Result Value Ref Range Status   SARS Coronavirus 2 by RT PCR NEGATIVE NEGATIVE Final    Comment: (NOTE) SARS-CoV-2 target nucleic acids are NOT DETECTED.  The SARS-CoV-2 RNA is generally detectable in upper respiratoy specimens during the acute phase of infection. The lowest concentration of SARS-CoV-2 viral copies this assay can detect is 131 copies/mL. A negative result does not preclude SARS-Cov-2 infection and should not be used as the sole basis for treatment or other patient management decisions. A negative result may occur with  improper specimen collection/handling, submission  of specimen other than nasopharyngeal swab, presence of viral  mutation(s) within the areas targeted by this assay, and inadequate number of viral copies (<131 copies/mL). A negative result must be combined with clinical observations, patient history, and epidemiological information. The expected result is Negative.  Fact Sheet for Patients:  PinkCheek.be  Fact Sheet for Healthcare Providers:  GravelBags.it  This test is no t yet approved or cleared by the [place] FDA and  has been authorized for detection and/or diagnosis of SARS-CoV-2 by FDA under an Emergency Use Authorization (EUA). This EUA will remain  in effect (meaning this test can be used) for the duration of the COVID-19 declaration under Section 564(b)(1) of the Act, 21 U.S.C. section 360bbb-3(b)(1), unless the authorization is terminated or revoked sooner.     Influenza A by PCR NEGATIVE NEGATIVE Final   Influenza B by PCR NEGATIVE NEGATIVE Final    Comment: (NOTE) The Xpert Xpress SARS-CoV-2/FLU/RSV assay is intended as an aid in  the diagnosis of influenza from Nasopharyngeal swab specimens and  should not be used as a sole basis for treatment. Nasal washings and  aspirates are unacceptable for Xpert Xpress SARS-CoV-2/FLU/RSV  testing.  Fact Sheet for Patients: PinkCheek.be  Fact Sheet for Healthcare Providers: GravelBags.it  This test is not yet approved or cleared by the [place] FDA and  has been authorized for detection and/or diagnosis of SARS-CoV-2 by  FDA under an Emergency Use Authorization (EUA). This EUA will remain  in effect (meaning this test can be used) for the duration of the  Covid-19 declaration under Section 564(b)(1) of the Act, 21  U.S.C. section 360bbb-3(b)(1), unless the authorization is  terminated or revoked. Performed at [facility], [place] [address], [place], [place] [address]      Time coordinating discharge  in minutes: 65  SIGNED:   Debbe Odea, MD  [facility] 02/22/2020, 10:07 AM

## 2020-02-23 ENCOUNTER — Encounter (HOSPITAL_COMMUNITY): Payer: Self-pay | Admitting: Cardiology

## 2020-03-08 ENCOUNTER — Encounter: Payer: Self-pay | Admitting: Cardiology

## 2020-03-08 ENCOUNTER — Other Ambulatory Visit: Payer: Self-pay

## 2020-03-08 ENCOUNTER — Ambulatory Visit: Payer: Medicaid Other | Admitting: Cardiology

## 2020-03-08 VITALS — BP 110/66 | HR 74 | Resp 16 | Ht 71.0 in | Wt 188.0 lb

## 2020-03-08 DIAGNOSIS — I25118 Atherosclerotic heart disease of native coronary artery with other forms of angina pectoris: Secondary | ICD-10-CM

## 2020-03-08 DIAGNOSIS — I480 Paroxysmal atrial fibrillation: Secondary | ICD-10-CM

## 2020-03-08 DIAGNOSIS — I10 Essential (primary) hypertension: Secondary | ICD-10-CM

## 2020-03-08 DIAGNOSIS — I4811 Longstanding persistent atrial fibrillation: Secondary | ICD-10-CM | POA: Insufficient documentation

## 2020-03-08 DIAGNOSIS — I5032 Chronic diastolic (congestive) heart failure: Secondary | ICD-10-CM

## 2020-03-08 NOTE — Progress Notes (Signed)
Patient referred by Marletta Lor, NP for chest pain  Subjective:   Kevin Orozco, male    DOB: Sep 30, 1955, 64 y.o.   MRN: [id number]   Chief Complaint  Patient presents with  . Hospitalization Follow-up     HPI  64 y.o. male with hypertension, paroxysmal Afib, moderate nonobstructive coronary artery disease (cath 2012), COPD, active smoker, prior h/o polysubstance abuse (EtOH, cocaine).  Patient was hospitalized in 10-02/2020 with complaints of shortness of breath. Was found to have acute on chronic heart failure with mildly reduced ejection fraction. BNP was elevated up to 426. He was also in A. fib with mild RVR. He was cardioverted on 02/21/2020. He was discharged home on p.o. spironolactone 20 mg daily and p.o. Lasix 40 mg prn for leg edema.   Since his hospitalization, his leg edema has resolved.  Breathing has improved.  He denies any chest pain.  He has been getting dizzy and has had falls leading to easy bruising on his forearms, hip, and forehead.  Fortunately, he has not lost consciousness.  Unfortunately, he continues to smoke 1 pack/day.  He is "not quite there in his head" to quit smoking yet.  Initial consultation visit HPI 07/11/2019: Patient previously seen by Dr. Melburn Popper, now wants to establish cardiac care with [name].   Patient has episodes of retrosternal chest pain, jaw and arm numbness, triggered by emotional stress, lasting for few minutes, resolving on its own.  His physical activity is limited due to his back pain.  He walks 100 feet twice a day with his small pet.  Does not have any chest pain during physical activity.  He has shortness of breath with minimal activity, such as tying his shoelaces.  He denies any presyncope, syncope.  He had coronary angiogram in 2012 that showed moderate nonobstructive coronary artery disease.  He has had paroxysmal atrial fibrillation in the past.  He is on 3 antianginal medications.  He does not take nitroglycerin as needed, as  it makes him feel "weird".  He is currently not on aspirin, as he states that he cannot afford low-dose aspirin. He denies any recent history of hematochezia, melena.  Patient is unemployed, lives in [place].  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  Since then, he has not been working.  He states that smoking is the only thing he can do during the daytime.  He smokes 2 packs/day.  He has tried Chantix, nicotine, without relief.  He used cocaine in the past, quit in 2018 also.  He also drink 12 pack of beers a day, for 15 years, quit several years ago.    Patient lives alone in a motel.  He has a daughter who lives in [place] who has 3 children with significant health issues.  He does not have anyone to look after him at a motel.  He is very sure that he does not want to undergo any surgery or procedures.  Matter-of-fact, he wants to be DO NOT RESUSCITATE status, states that his father also died at 38 and he feels like his time is coming some.  Current Outpatient Medications on File Prior to Visit  Medication Sig Dispense Refill  . clonazePAM (KLONOPIN) 0.5 MG tablet Take 0.5 mg by mouth See admin instructions. Take 1 mg by mouth at bedtime and an additional 0.5 mg two tmes a day as needed for anxiety    . diclofenac sodium (VOLTAREN) 1 % GEL  Apply 2 g topically 4 (four) times daily as needed (for pain).     . finasteride (PROSCAR) 5 MG tablet Take 5 mg by mouth daily.    . Fluticasone-Salmeterol (ADVAIR) 250-50 MCG/DOSE AEPB Inhale 1 puff into the lungs 2 (two) times daily.    . furosemide (LASIX) 40 MG tablet Take 1 tablet (40 mg total) by mouth daily as needed. For fluid weight gain- 30 tablet 0  . gabapentin (NEURONTIN) 300 MG capsule Take 300 mg by mouth in the morning.    . hydrochlorothiazide (HYDRODIURIL) 25 MG tablet Take 1 tablet (25 mg total) by mouth daily. 30 tablet 11  . ipratropium-albuterol (DUONEB) 0.5-2.5 (3) MG/3ML SOLN  Take 3 mLs by nebulization daily as needed (for shortness of breath or wheezing).    . isosorbide mononitrate (IMDUR) 30 MG 24 hr tablet Take 1 tablet (30 mg total) by mouth daily. 30 tablet 0  . metoprolol (TOPROL-XL) 200 MG 24 hr tablet Take 200 mg by mouth in the morning.   0  . morphine (MS CONTIN) 30 MG 12 hr tablet Take 30 mg by mouth See admin instructions. Take 30 mg by mouth at 8 AM and 8 PM    . naloxone (NARCAN) nasal spray 4 mg/0.1 mL Place 1 spray into the nose once as needed (AS DIRECTED).     . nitroGLYCERIN (NITROSTAT) 0.4 MG SL tablet Place under the tongue every 5 (five) minutes as needed for chest pain.     Marland Kitchen oxyCODONE-acetaminophen (PERCOCET) 10-325 MG tablet Take 1 tablet by mouth 4 (four) times daily as needed for pain.   0  . pantoprazole (PROTONIX) 40 MG tablet Take 40 mg by mouth daily before breakfast.    . predniSONE (DELTASONE) 10 MG tablet Take 10 mg by mouth daily with breakfast.    . PROAIR HFA 108 (90 Base) MCG/ACT inhaler Inhale 2 puffs into the lungs every 6 (six) hours as needed for wheezing or shortness of breath.    . ranolazine (RANEXA) 500 MG 12 hr tablet TAKE ONE TABLET BY MOUTH TWICE DAILY (Patient taking differently: Take 500 mg by mouth 2 (two) times daily. ) 60 tablet 6  . rivaroxaban (XARELTO) 20 MG TABS tablet Take 1 tablet (20 mg total) by mouth daily with supper. 90 tablet 2  . rosuvastatin (CRESTOR) 10 MG tablet Take 1 tablet (10 mg total) by mouth daily. 30 tablet 3  . sertraline (ZOLOFT) 50 MG tablet Take 50 mg by mouth in the morning.     Marland Kitchen SPIRIVA HANDIHALER 18 MCG inhalation capsule Place 18 mcg into inhaler and inhale daily.    Marland Kitchen spironolactone (ALDACTONE) 25 MG tablet Take 1 tablet (25 mg total) by mouth daily. 30 tablet 0  . tamsulosin (FLOMAX) 0.4 MG CAPS capsule Take 0.4 mg by mouth daily.  0  . traZODone (DESYREL) 100 MG tablet Take 100 mg by mouth at bedtime.     No current facility-administered medications on file prior to visit.     Cardiovascular and other pertinent studies:  EKG 03/08/2020: Sinus rhyth 71 bpm Left atrial enlargement Occasional PVC  EKG 12/24/2019: Atrial fibrillation 91 bpm  Echocardiogram 12/14/2019:  Left ventricle cavity is normal in size and wall thickness. Mildly  depressed LV systolic function with visual EF 45-50%. Normal global wall  motion. Diastolic function not assessed due to Afib.  Left atrial cavity is moderately dilated.  Moderate (Grade II) mitral regurgitation.  Mild to moderate tricuspid regurgitation. Estimated pulmonary artery  systolic  pressure 23 mmHg.  EKG 07/12/2019: Atrial fibrillation, controlled ventricular rate 90 bpm. Nonspecific T-abnormality.   Coronary angiography 2012: LM: Normal LAD: Ostial 40-50% stenosis, mid LAD 30% stenosis LCx: OM2 30% stenosis RCA: Mid 20% stenosis and posterior lateral branch   Recent labs:  09/09/2019: HbA1C 5.4%  12/01/2018: H/H 13/38.2. MCV 97. Platelets 157   Review of Systems  Cardiovascular: Positive for chest pain and dyspnea on exertion. Negative for leg swelling, palpitations and syncope.  Respiratory: Positive for shortness of breath.   Gastrointestinal: Negative for hematochezia and melena.  Neurological: Positive for dizziness and loss of balance.       Memory issues  Psychiatric/Behavioral: Positive for depression. Negative for suicidal ideas.         Vitals:   03/08/20 1422  BP: 110/66  Pulse: 74  Resp: 16  SpO2: 97%     Body mass index is 26.22 kg/m. Filed Weights   03/08/20 1422  Weight: 188 lb (85.3 kg)     Objective:   Physical Exam Vitals and nursing note reviewed.  Constitutional:      Appearance: He is well-developed.  Neck:     Vascular: No JVD.  Cardiovascular:     Rate and Rhythm: Normal rate and regular rhythm.     Pulses: Intact distal pulses.     Heart sounds: Normal heart sounds. No murmur heard.   Pulmonary:     Effort: Pulmonary effort is normal.     Breath  sounds: Normal breath sounds. No wheezing or rales.  Musculoskeletal:     Right lower leg: Edema (Trace) present.     Left lower leg: No edema.  Skin:    Findings: Bruising present.        Assessment & Recommendations:   64 y.o. male with hypertension, paroxysmal Afib, moderate nonobstructive coronary artery disease (cath 2012), COPD, active smoker, prior h/o polysubstance abuse (EtOH, cocaine), now with increasing chest pain.   CAD with stable angina: Clinical diagnosis. Improved on current antianginal therapy.Conitue medical management. Unofrtunately, he does not want to quit smoking.  HFpEF: Mildly reduced EF. Euvolumic today. Tolerating spironolactone 25 mg daily. Stopped HCTZ. Use lasix 40 mg 1-2 tabs prn for leg edema  Paroxysmal Afib: In sinus rhythm today. CHA2DS2VASc score 2, annual stroke risk 2%. Continue Xarelto to 20 mg daily. Not on Aspirin due to ongoing use of Xarelto.  Easy bruising due to Xarelto. Stopped HCTZ given possible cause for his dizziness and falls.   Hypertension: As above  Goals of care: Patient is sure about his wishes, does not want to be resuscitated.  He does not want to undergo any invasive work-up or management.  I do suspect that he has underlying depression.  Defer management to PCP.  That said, he is competent to make his decisions.   F/u in 4 weeks  Zayda Angell Emiliano Dyer, MD [facility] [place] Pager: [phone] Office: [phone]

## 2020-03-20 ENCOUNTER — Other Ambulatory Visit: Payer: Self-pay | Admitting: Cardiology

## 2020-03-20 DIAGNOSIS — I4811 Longstanding persistent atrial fibrillation: Secondary | ICD-10-CM

## 2020-03-20 DIAGNOSIS — I25118 Atherosclerotic heart disease of native coronary artery with other forms of angina pectoris: Secondary | ICD-10-CM

## 2020-04-02 NOTE — Progress Notes (Addendum)
Patient referred by Marletta Lor, NP for chest pain  Subjective:   Kevin Orozco, male    DOB: 09-16-1955, 64 y.o.   MRN: [id number]   Chief Complaint  Patient presents with  . Atrial Fibrillation  . Dizziness  . Follow-up    4 weeks     HPI  64 y.o. male with hypertension, paroxysmal Afib, moderate nonobstructive coronary artery disease (cath 2012), COPD, active smoker, prior h/o polysubstance abuse (EtOH, cocaine).  Patient was hospitalized in 10-02/2020 with complaints of shortness of breath. Was found to have acute on chronic heart failure with mildly reduced ejection fraction. BNP was elevated up to 426. He was also in A. fib with mild RVR. He was cardioverted on 02/21/2020. He was discharged home on p.o. spironolactone 20 mg daily and p.o. Lasix 40 mg prn for leg edema.   At visit in 02/2020, I stopped HCTZ given possible cause for his dizziness and falls. With this, his dizziness has resolved. He has not had any falls. He has easy bleeding and bruising, but denies any melena, hematemesis.   Unfortunately, he continues to smoke 1 pack/day.  He is "not quite there in his head" to quit smoking yet.  Initial consultation visit HPI 07/11/2019: Patient previously seen by Dr. Melburn Popper, now wants to establish cardiac care with [name].   Patient has episodes of retrosternal chest pain, jaw and arm numbness, triggered by emotional stress, lasting for few minutes, resolving on its own.  His physical activity is limited due to his back pain.  He walks 100 feet twice a day with his small pet.  Does not have any chest pain during physical activity.  He has shortness of breath with minimal activity, such as tying his shoelaces.  He denies any presyncope, syncope.  He had coronary angiogram in 2012 that showed moderate nonobstructive coronary artery disease.  He has had paroxysmal atrial fibrillation in the past.  He is on 3 antianginal medications.  He does not take nitroglycerin as needed, as  it makes him feel "weird".  He is currently not on aspirin, as he states that he cannot afford low-dose aspirin. He denies any recent history of hematochezia, melena.  Patient is unemployed, lives in [place].  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  Since then, he has not been working.  He states that smoking is the only thing he can do during the daytime.  He smokes 2 packs/day.  He has tried Chantix, nicotine, without relief.  He used cocaine in the past, quit in 2018 also.  He also drink 12 pack of beers a day, for 15 years, quit several years ago.    Patient lives alone in a motel.  He has a daughter who lives in [place] who has 3 children with significant health issues.  He does not have anyone to look after him at a motel.  He is very sure that he does not want to undergo any surgery or procedures.  Matter-of-fact, he wants to be DO NOT RESUSCITATE status, states that his father also died at 48 and he feels like his time is coming some.  Current Outpatient Medications on File Prior to Visit  Medication Sig Dispense Refill  . clonazePAM (KLONOPIN) 0.5 MG tablet Take 0.5 mg by mouth See admin instructions. Take 1 mg by mouth at bedtime and an additional 0.5 mg two tmes a day as needed for anxiety    . diclofenac  sodium (VOLTAREN) 1 % GEL Apply 2 g topically 4 (four) times daily as needed (for pain).     . finasteride (PROSCAR) 5 MG tablet Take 5 mg by mouth daily.    . Fluticasone-Salmeterol (ADVAIR) 250-50 MCG/DOSE AEPB Inhale 1 puff into the lungs 2 (two) times daily.    Marland Kitchen gabapentin (NEURONTIN) 300 MG capsule Take 300 mg by mouth in the morning.    Marland Kitchen ipratropium-albuterol (DUONEB) 0.5-2.5 (3) MG/3ML SOLN Take 3 mLs by nebulization daily as needed (for shortness of breath or wheezing).    . isosorbide mononitrate (IMDUR) 30 MG 24 hr tablet Take 1 tablet (30 mg total) by mouth daily. 30 tablet 0  . metoprolol (TOPROL-XL) 200 MG 24 hr  tablet Take 200 mg by mouth in the morning.   0  . morphine (MS CONTIN) 30 MG 12 hr tablet Take 30 mg by mouth See admin instructions. Take 30 mg by mouth at 8 AM and 8 PM    . nitroGLYCERIN (NITROSTAT) 0.4 MG SL tablet Place under the tongue every 5 (five) minutes as needed for chest pain.    Marland Kitchen oxyCODONE-acetaminophen (PERCOCET) 10-325 MG tablet Take 1 tablet by mouth 4 (four) times daily as needed for pain.   0  . pantoprazole (PROTONIX) 40 MG tablet Take 40 mg by mouth daily before breakfast.    . predniSONE (DELTASONE) 10 MG tablet Take 10 mg by mouth daily with breakfast.    . PROAIR HFA 108 (90 Base) MCG/ACT inhaler Inhale 2 puffs into the lungs every 6 (six) hours as needed for wheezing or shortness of breath.    . ranolazine (RANEXA) 500 MG 12 hr tablet TAKE ONE TABLET BY MOUTH TWICE DAILY (Patient taking differently: Take 500 mg by mouth 2 (two) times daily.) 60 tablet 6  . rosuvastatin (CRESTOR) 10 MG tablet Take 1 tablet (10 mg total) by mouth daily. 90 tablet 1  . sertraline (ZOLOFT) 50 MG tablet Take 50 mg by mouth in the morning.     Marland Kitchen SPIRIVA HANDIHALER 18 MCG inhalation capsule Place 18 mcg into inhaler and inhale daily.    Marland Kitchen spironolactone (ALDACTONE) 25 MG tablet Take 1 tablet (25 mg total) by mouth daily. 30 tablet 0  . tamsulosin (FLOMAX) 0.4 MG CAPS capsule Take 0.4 mg by mouth daily.  0  . traZODone (DESYREL) 100 MG tablet Take 100 mg by mouth at bedtime.    Carlena Hurl 20 MG TABS tablet Take 1 tablet (20 mg total) by mouth daily with supper. 90 tablet 1  . furosemide (LASIX) 40 MG tablet Take 1 tablet (40 mg total) by mouth daily as needed. For fluid weight gain- 30 tablet 0  . hydrochlorothiazide (HYDRODIURIL) 25 MG tablet Take 25 mg by mouth daily. (Patient not taking: Reported on 04/03/2020)     No current facility-administered medications on file prior to visit.    Cardiovascular and other pertinent studies:  EKG 03/08/2020: Sinus rhyth 71 bpm Left atrial  enlargement Occasional PVC  EKG 12/24/2019: Atrial fibrillation 91 bpm  Echocardiogram 12/14/2019:  Left ventricle cavity is normal in size and wall thickness. Mildly  depressed LV systolic function with visual EF 45-50%. Normal global wall  motion. Diastolic function not assessed due to Afib.  Left atrial cavity is moderately dilated.  Moderate (Grade II) mitral regurgitation.  Mild to moderate tricuspid regurgitation. Estimated pulmonary artery  systolic pressure 23 mmHg.  EKG 07/12/2019: Atrial fibrillation, controlled ventricular rate 90 bpm. Nonspecific T-abnormality.   Coronary angiography  2012: LM: Normal LAD: Ostial 40-50% stenosis, mid LAD 30% stenosis LCx: OM2 30% stenosis RCA: Mid 20% stenosis and posterior lateral branch   Recent labs:  09/09/2019: HbA1C 5.4%  12/01/2018: H/H 13/38.2. MCV 97. Platelets 157   Review of Systems  Cardiovascular: Positive for chest pain and dyspnea on exertion. Negative for leg swelling, palpitations and syncope.  Respiratory: Positive for shortness of breath.   Gastrointestinal: Negative for hematochezia and melena.  Neurological: Positive for dizziness and loss of balance.       Memory issues  Psychiatric/Behavioral: Positive for depression. Negative for suicidal ideas.         Vitals:   04/03/20 0951  BP: 107/67  Pulse: 60  Resp: 17  SpO2: 95%     Body mass index is 27.2 kg/m. Filed Weights   04/03/20 0951  Weight: 195 lb (88.5 kg)     Objective:   Physical Exam Vitals and nursing note reviewed.  Constitutional:      Appearance: He is well-developed.  Neck:     Vascular: No JVD.  Cardiovascular:     Rate and Rhythm: Normal rate and regular rhythm.     Pulses: Intact distal pulses.     Heart sounds: Normal heart sounds. No murmur heard.   Pulmonary:     Effort: Pulmonary effort is normal.     Breath sounds: Normal breath sounds. No wheezing or rales.  Musculoskeletal:     Right lower leg: Edema  (Trace) present.     Left lower leg: No edema.  Skin:    Findings: Bruising present.        Assessment & Recommendations:   64 y.o. male with hypertension, paroxysmal Afib, moderate nonobstructive coronary artery disease (cath 2012), COPD, active smoker, prior h/o polysubstance abuse (EtOH, cocaine), now with increasing chest pain.   CAD with stable angina: Clinical diagnosis. Improved on current antianginal therapy.Conitue medical management. Unofrtunately, he does not want to quit smoking.  HFpEF: Mildly reduced EF. Euvolumic today. Tolerating spironolactone 25 mg daily. Use lasix 40 mg 1-2 tabs prn for leg edema  Paroxysmal Afib: In sinus rhythm today. CHA2DS2VASc score 2, annual stroke risk 2%. Continue Xarelto to 20 mg daily. Not on Aspirin due to ongoing use of Xarelto.  Easy bruising due to Xarelto, without any major bleeding.  Hypertension: Controlled  Goals of care: Patient is sure about his wishes, does not want to be resuscitated.  He does not want to undergo any invasive work-up or management.  I do suspect that he has underlying depression.  Defer management to PCP.  That said, he is competent to make his decisions.   F/u in 3 months  Ellerie Arenz Emiliano Dyer, MD [facility] Pager: [phone] Office: [phone]

## 2020-04-03 ENCOUNTER — Other Ambulatory Visit: Payer: Self-pay

## 2020-04-03 ENCOUNTER — Encounter: Payer: Self-pay | Admitting: Cardiology

## 2020-04-03 ENCOUNTER — Ambulatory Visit: Payer: Medicaid Other | Admitting: Cardiology

## 2020-04-03 VITALS — BP 107/67 | HR 60 | Resp 17 | Ht 71.0 in | Wt 195.0 lb

## 2020-04-03 DIAGNOSIS — J449 Chronic obstructive pulmonary disease, unspecified: Secondary | ICD-10-CM

## 2020-04-03 DIAGNOSIS — I10 Essential (primary) hypertension: Secondary | ICD-10-CM

## 2020-04-03 DIAGNOSIS — I25118 Atherosclerotic heart disease of native coronary artery with other forms of angina pectoris: Secondary | ICD-10-CM

## 2020-04-03 DIAGNOSIS — I480 Paroxysmal atrial fibrillation: Secondary | ICD-10-CM

## 2020-05-19 ENCOUNTER — Other Ambulatory Visit: Payer: Self-pay | Admitting: Cardiology

## 2020-05-19 DIAGNOSIS — I4811 Longstanding persistent atrial fibrillation: Secondary | ICD-10-CM

## 2020-06-16 ENCOUNTER — Other Ambulatory Visit: Payer: Self-pay | Admitting: Cardiology

## 2020-06-16 DIAGNOSIS — I25118 Atherosclerotic heart disease of native coronary artery with other forms of angina pectoris: Secondary | ICD-10-CM

## 2020-06-16 DIAGNOSIS — I4811 Longstanding persistent atrial fibrillation: Secondary | ICD-10-CM

## 2020-06-22 ENCOUNTER — Ambulatory Visit: Payer: Medicaid Other | Admitting: Cardiology

## 2020-06-22 NOTE — Progress Notes (Signed)
No show

## 2020-06-23 ENCOUNTER — Ambulatory Visit: Payer: Medicaid Other | Admitting: Cardiology

## 2020-06-23 DIAGNOSIS — I5032 Chronic diastolic (congestive) heart failure: Secondary | ICD-10-CM

## 2020-06-23 DIAGNOSIS — I10 Essential (primary) hypertension: Secondary | ICD-10-CM

## 2020-06-23 DIAGNOSIS — J449 Chronic obstructive pulmonary disease, unspecified: Secondary | ICD-10-CM

## 2020-06-23 DIAGNOSIS — I480 Paroxysmal atrial fibrillation: Secondary | ICD-10-CM

## 2020-06-23 DIAGNOSIS — I25118 Atherosclerotic heart disease of native coronary artery with other forms of angina pectoris: Secondary | ICD-10-CM

## 2020-07-13 ENCOUNTER — Other Ambulatory Visit: Payer: Self-pay | Admitting: Cardiology

## 2020-07-13 DIAGNOSIS — I25118 Atherosclerotic heart disease of native coronary artery with other forms of angina pectoris: Secondary | ICD-10-CM

## 2020-07-20 ENCOUNTER — Other Ambulatory Visit: Payer: Self-pay | Admitting: Cardiology

## 2020-07-20 DIAGNOSIS — I25118 Atherosclerotic heart disease of native coronary artery with other forms of angina pectoris: Secondary | ICD-10-CM

## 2020-08-10 ENCOUNTER — Other Ambulatory Visit: Payer: Self-pay | Admitting: Cardiology

## 2020-08-10 DIAGNOSIS — I4811 Longstanding persistent atrial fibrillation: Secondary | ICD-10-CM

## 2020-09-01 ENCOUNTER — Other Ambulatory Visit: Payer: Self-pay | Admitting: Physician Assistant

## 2020-09-01 DIAGNOSIS — Z122 Encounter for screening for malignant neoplasm of respiratory organs: Secondary | ICD-10-CM

## 2020-09-25 ENCOUNTER — Inpatient Hospital Stay: Admission: RE | Admit: 2020-09-25 | Payer: Medicaid Other | Source: Ambulatory Visit

## 2020-10-02 ENCOUNTER — Ambulatory Visit: Payer: Medicaid Other | Admitting: Cardiology

## 2020-10-09 ENCOUNTER — Other Ambulatory Visit: Payer: Self-pay | Admitting: Physician Assistant

## 2020-10-09 DIAGNOSIS — Z122 Encounter for screening for malignant neoplasm of respiratory organs: Secondary | ICD-10-CM

## 2020-10-13 ENCOUNTER — Other Ambulatory Visit: Payer: Self-pay | Admitting: Cardiology

## 2020-10-13 DIAGNOSIS — I25118 Atherosclerotic heart disease of native coronary artery with other forms of angina pectoris: Secondary | ICD-10-CM

## 2020-11-07 ENCOUNTER — Other Ambulatory Visit: Payer: Self-pay | Admitting: Cardiology

## 2020-11-07 DIAGNOSIS — I4811 Longstanding persistent atrial fibrillation: Secondary | ICD-10-CM

## 2020-11-09 ENCOUNTER — Other Ambulatory Visit: Payer: Self-pay | Admitting: Cardiology

## 2020-11-09 DIAGNOSIS — I4811 Longstanding persistent atrial fibrillation: Secondary | ICD-10-CM

## 2021-01-08 ENCOUNTER — Other Ambulatory Visit: Payer: Self-pay | Admitting: Cardiology

## 2021-01-08 DIAGNOSIS — I25118 Atherosclerotic heart disease of native coronary artery with other forms of angina pectoris: Secondary | ICD-10-CM

## 2021-02-01 NOTE — Progress Notes (Signed)
Patient referred by Marletta Lor, NP for chest pain  Subjective:   Kevin Orozco, male    DOB: 1955-08-08, 65 y.o.   MRN: [id number]   Chief Complaint  Patient presents with   New Patient (Initial Visit)   Coronary artery disease of native artery of native heart wi     HPI  65 y.o. male with hypertension, paroxysmal Afib, moderate nonobstructive coronary artery disease (cath 2012), COPD, active smoker, prior h/o polysubstance abuse (EtOH, cocaine).  Patient was hospitalized in 01/31/2020 with complaints of shortness of breath. Was found to have acute on chronic heart failure with mildly reduced ejection fraction. BNP was elevated up to 426. He was also in A. fib with mild RVR. He was cardioverted on 02/21/2020.   Patient has been unable to tolerate HCTZ in the past due to dizziness and falls.  Patient presents for urgent visit at the request of his PCP.  At last office visit patient was stable from a cardiovascular standpoint, therefore no changes were made.  Unfortunately since last office visit patient has been lost to follow-up (no showed multiple appointments).  PCP recently made a visit to his house.  Patient has been without his medications for an extended period of time and reports over the last few days he has had worsening shortness of breath.  PCP refilled metoprolol, however patient has not yet picked it up to restart this medication.  Patient was previously on Xarelto, however about 4 months ago he noticed blood in his stool and PCP advised him to discontinue Xarelto at that time.  Unfortunately patient did not follow-up for further evaluation and management.  He has been off Xarelto for the last 4 months.  Patient denies chest pain, syncope, near syncope, dizziness.  He has had no recurrence of blood in his stool since about 4 months ago.  Unfortunately, he continues to smoke 1 pack/day.  He is "not quite there in his head" to quit smoking yet.  Initial consultation visit  HPI 07/11/2019: Patient previously seen by Dr. Melburn Popper, now wants to establish cardiac care with [name].   Patient has episodes of retrosternal chest pain, jaw and arm numbness, triggered by emotional stress, lasting for few minutes, resolving on its own.  His physical activity is limited due to his back pain.  He walks 100 feet twice a day with his small pet.  Does not have any chest pain during physical activity.  He has shortness of breath with minimal activity, such as tying his shoelaces.  He denies any presyncope, syncope.  He had coronary angiogram in 2012 that showed moderate nonobstructive coronary artery disease.  He has had paroxysmal atrial fibrillation in the past.  He is on 3 antianginal medications.  He does not take nitroglycerin as needed, as it makes him feel "weird".  He is currently not on aspirin, as he states that he cannot afford low-dose aspirin. He denies any recent history of hematochezia, melena.  Patient is unemployed, lives in [place].  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  Since then, he has not been working.  He states that smoking is the only thing he can do during the daytime.  He smokes 2 packs/day.  He has tried Chantix, nicotine, without relief.  He used cocaine in the past, quit in 2018 also.  He also drink 12 pack of beers a day, for 15 years, quit several years ago.    Patient lives alone  in a motel.  He has a daughter who lives in [place] who has 3 children with significant health issues.  He does not have anyone to look after him at a motel.  He is very sure that he does not want to undergo any surgery or procedures.  Matter-of-fact, he wants to be DO NOT RESUSCITATE status, states that his father also died at 60 and he feels like his time is coming some.  Current Outpatient Medications on File Prior to Visit  Medication Sig Dispense Refill   clonazePAM (KLONOPIN) 0.5 MG tablet Take 0.5 mg by mouth See  admin instructions. Take 1 mg by mouth at bedtime and an additional 0.5 mg two tmes a day as needed for anxiety     diclofenac sodium (VOLTAREN) 1 % GEL Apply 2 g topically 4 (four) times daily as needed (for pain).      finasteride (PROSCAR) 5 MG tablet Take 5 mg by mouth daily.     Fluticasone-Salmeterol (ADVAIR) 250-50 MCG/DOSE AEPB Inhale 1 puff into the lungs 2 (two) times daily.     furosemide (LASIX) 40 MG tablet Take 1 tablet (40 mg total) by mouth daily as needed. For fluid weight gain- 30 tablet 0   gabapentin (NEURONTIN) 300 MG capsule Take 300 mg by mouth in the morning.     ipratropium-albuterol (DUONEB) 0.5-2.5 (3) MG/3ML SOLN Take 3 mLs by nebulization daily as needed (for shortness of breath or wheezing).     isosorbide mononitrate (IMDUR) 30 MG 24 hr tablet Take 1 tablet (30 mg total) by mouth daily. 30 tablet 0   morphine (MS CONTIN) 30 MG 12 hr tablet Take 30 mg by mouth See admin instructions. Take 30 mg by mouth at 8 AM and 8 PM     nitroGLYCERIN (NITROSTAT) 0.4 MG SL tablet Place under the tongue every 5 (five) minutes as needed for chest pain.     oxyCODONE-acetaminophen (PERCOCET) 10-325 MG tablet Take 1 tablet by mouth 4 (four) times daily as needed for pain.   0   pantoprazole (PROTONIX) 40 MG tablet Take 40 mg by mouth daily before breakfast.     predniSONE (DELTASONE) 10 MG tablet Take 10 mg by mouth daily with breakfast.     PROAIR HFA 108 (90 Base) MCG/ACT inhaler Inhale 2 puffs into the lungs every 6 (six) hours as needed for wheezing or shortness of breath.     rosuvastatin (CRESTOR) 10 MG tablet Take 1 tablet (10 mg total) by mouth daily. 90 tablet 1   SPIRIVA HANDIHALER 18 MCG inhalation capsule Place 18 mcg into inhaler and inhale daily.     spironolactone (ALDACTONE) 25 MG tablet Take 1 tablet (25 mg total) by mouth daily. 30 tablet 0   SYMBICORT 160-4.5 MCG/ACT inhaler Inhale 1 puff into the lungs as needed.     tamsulosin (FLOMAX) 0.4 MG CAPS capsule Take 0.4  mg by mouth daily.  0   traZODone (DESYREL) 100 MG tablet Take 100 mg by mouth at bedtime.     TRELEGY ELLIPTA 200-62.5-25 MCG/INH AEPB Inhale 1 puff into the lungs daily. (Patient not taking: Reported on 02/02/2021)     No current facility-administered medications on file prior to visit.    Cardiovascular and other pertinent studies: EKG 02/06/2021:  Atrial flutter at a rate of 111 bpm  EKG 03/08/2020: Sinus rhyth 71 bpm Left atrial enlargement Occasional PVC  EKG 12/24/2019: Atrial fibrillation 91 bpm  Echocardiogram 12/14/2019:  Left ventricle cavity is normal in  size and wall thickness. Mildly  depressed LV systolic function with visual EF 45-50%. Normal global wall  motion. Diastolic function not assessed due to Afib.  Left atrial cavity is moderately dilated.  Moderate (Grade II) mitral regurgitation.  Mild to moderate tricuspid regurgitation. Estimated pulmonary artery  systolic pressure 23 mmHg.  EKG 07/12/2019: Atrial fibrillation, controlled ventricular rate 90 bpm. Nonspecific T-abnormality.   Coronary angiography 2012: LM: Normal LAD: Ostial 40-50% stenosis, mid LAD 30% stenosis LCx: OM2 30% stenosis RCA: Mid 20% stenosis and posterior lateral branch   Recent labs: CMP Latest Ref Rng & Units 02/22/2020 02/21/2020 02/20/2020  Glucose 70 - 99 mg/dL 235(T) 89 614(E)  BUN 8 - 23 mg/dL 17 10 9   Creatinine 0.61 - 1.24 mg/dL 3.15 4.00  Sodium 135 - 145 mmol/L 135 137 136  Potassium 3.5 - 5.1 mmol/L 4.4 3.8 4.0  Chloride 98 - 111 mmol/L 100 102 104  CO2 22 - 32 mmol/L 26 24 26   Calcium 8.9 - 10.3 mg/dL 8.9 9.0 9.0  Total Protein 6.5 - 8.1 g/dL - - -  Total Bilirubin 0.3 - 1.2 mg/dL - - -  Alkaline Phos 38 - 126 U/L - - -  AST 15 - 41 U/L - - -  ALT 17 - 63 U/L - - -   CBC Latest Ref Rng & Units 02/18/2020 02/26/2015 02/25/2015  WBC 4.0 - 10.5 K/uL 5.3 6.9 4.1  Hemoglobin 13.0 - 17.0 g/dL 11.6(L) 11.3(L) 12.8(L)  Hematocrit 39.0 - 52.0 % 35.8(L) 33.8(L) 39.0   Platelets 150 - 400 K/uL 150 122(L) 129(L)   Lipid Panel     Component Value Date/Time   CHOL 125 04/20/2012 0500   TRIG 100 04/20/2012 0500   HDL 36 (L) 04/20/2012 0500   CHOLHDL 3.5 04/20/2012 0500   VLDL 20 04/20/2012 0500   LDLCALC 69 04/20/2012 0500   HEMOGLOBIN A1C Lab Results  Component Value Date   HGBA1C 5.4 09/09/2019   MPG 97 02/22/2015   TSH No results for input(s): TSH in the last 8760 hours.  Other labs:  02/01/2021: BUN 14, creatinine 0.87, GFR >60, sodium 142, potassium 3.8, AST 32, ALT 27 Total cholesterol 101, triglycerides 41, HDL 47, LDL 43 BNP pending  09/09/2019: HbA1C 5.4%  12/01/2018: H/H 13/38.2. MCV 97. Platelets 157   Review of Systems  Constitutional: Positive for malaise/fatigue. Negative for weight gain.  Cardiovascular:  Positive for dyspnea on exertion and palpitations. Negative for chest pain, claudication, leg swelling, near-syncope, orthopnea, paroxysmal nocturnal dyspnea and syncope.  Respiratory:  Positive for shortness of breath.   Neurological:  Negative for dizziness.        Vitals:   02/02/21 0948  BP: 130/81  Pulse: 91  Resp: 17  Temp: (!) 97.3 F (36.3 C)  SpO2: 95%     Body mass index is 30.04 kg/m. Filed Weights   02/02/21 0948  Weight: 215 lb 6.4 oz (97.7 kg)     Objective:   Physical Exam Vitals and nursing note reviewed.  Constitutional:      Appearance: He is well-developed.  HENT:     Head: Normocephalic and atraumatic.  Neck:     Vascular: No JVD.  Cardiovascular:     Rate and Rhythm: Tachycardia present. Rhythm irregular.     Pulses: Intact distal pulses.     Heart sounds: Normal heart sounds, S1 normal and S2 normal. No murmur heard.   No gallop.  Pulmonary:     Effort: Pulmonary effort is  normal. No respiratory distress.     Breath sounds: Normal breath sounds. No wheezing, rhonchi or rales.  Musculoskeletal:     Right lower leg: Edema (Trace) present.     Left lower leg: No edema.   Neurological:     Mental Status: He is alert.       Assessment & Recommendations:   65 y.o. male with hypertension, paroxysmal Afib, moderate nonobstructive coronary artery disease (cath 2012), COPD, active smoker, prior h/o polysubstance abuse (EtOH, cocaine).   CAD with stable angina: Continue beta-blocker and statin therapy. Continue Ranexa Could consider repeat ischemic evaluation given patient's symptoms and recurrence of atrial fibrillation/flutter, however will reevaluate need at next office visit. Patient is not presently on aspirin given blood in his stool 4 months ago.  HFpEF: Mildly reduced EF - 45-50% 11/2019 Patient is euvolemic on exam, without evidence of acute decompensated heart failure. Given dyspnea and recurrence of atrial flutter/fibrillation will obtain repeat echocardiogram. Patient's PCP recently made a house call and BNP is pending. Patient has been without medications for an extended period of time, advised that he resume Lasix as needed, Imdur, metoprolol, and spironolactone.  PCP has refilled these medications.  Paroxysmal atrial fibrillation/flutter: CHA2DS2VASc score 2, annual stroke risk 2%. Patient presently in atrial fibrillation Resume metoprolol succinate 200 mg once daily for rate control. Patient has reported history of GI bleed approximately 4 months ago on Xarelto, therefore PCP advised him to discontinue this, unfortunately he was lost to follow-up until now. Could consider anticoagulation with our in office Coumadin clinic if patient is willing to follow-up regularly. However given patient's history of medication noncompliance and difficulty following up with our office risks versus benefits of anticoagulation must be weighed.  We will reevaluate and discuss further at upcoming office visit.  Do not feel patient would be candidate for home monitoring of INR. Could also consider ablation of a flutter if patient would be willing to follow-up with  anticoagulation clinic for a finite period of time.  We will further discuss EP evaluation at next office visit. Suspect patient's dyspnea is related to underlying atrial flutter with rapid ventricular response, we will proceed with rate control.  Patient has likely had recurrence of A. fib as he has been without medications including metoprolol for an extended period of time.  Hypertension: Relatively well controlled.  We will continue to monitor as patient resumes previously prescribed antihypertensive medications.  Follow-up in 2 weeks, sooner if needed, for HFpEF, atrial fibrillation/flutter, CAD, hypertension.   This was a 45-minute encounter with face-to-face counseling, medical records review, coordination of care, explanation of complex medical issues, complex medical decision making.    Rayford Halsted, PA-C 02/06/2021, 3:44 PM Office: [phone]

## 2021-02-02 ENCOUNTER — Encounter: Payer: Self-pay | Admitting: Student

## 2021-02-02 ENCOUNTER — Ambulatory Visit: Payer: Medicaid Other | Admitting: Student

## 2021-02-02 ENCOUNTER — Other Ambulatory Visit: Payer: Self-pay

## 2021-02-02 VITALS — BP 130/81 | HR 91 | Temp 97.3°F | Resp 17 | Ht 71.0 in | Wt 215.4 lb

## 2021-02-02 DIAGNOSIS — I5032 Chronic diastolic (congestive) heart failure: Secondary | ICD-10-CM

## 2021-02-02 DIAGNOSIS — I10 Essential (primary) hypertension: Secondary | ICD-10-CM

## 2021-02-02 DIAGNOSIS — I480 Paroxysmal atrial fibrillation: Secondary | ICD-10-CM

## 2021-02-02 DIAGNOSIS — I25118 Atherosclerotic heart disease of native coronary artery with other forms of angina pectoris: Secondary | ICD-10-CM

## 2021-02-02 MED ORDER — METOPROLOL SUCCINATE ER 200 MG PO TB24
200.0000 mg | ORAL_TABLET | Freq: Every morning | ORAL | 0 refills | Status: DC
Start: 2021-02-02 — End: 2021-06-26

## 2021-02-06 ENCOUNTER — Other Ambulatory Visit: Payer: Self-pay | Admitting: Cardiology

## 2021-02-06 DIAGNOSIS — I25118 Atherosclerotic heart disease of native coronary artery with other forms of angina pectoris: Secondary | ICD-10-CM

## 2021-02-13 ENCOUNTER — Other Ambulatory Visit: Payer: Self-pay

## 2021-02-13 ENCOUNTER — Ambulatory Visit: Payer: Medicaid Other

## 2021-02-13 DIAGNOSIS — I5032 Chronic diastolic (congestive) heart failure: Secondary | ICD-10-CM

## 2021-02-22 NOTE — Progress Notes (Addendum)
Patient referred by Kevin Chou, NP for chest pain  Subjective:   Kevin Orozco, male    DOB: [birth year], 65 y.o.   MRN: [id number]   Chief Complaint  Patient presents with   Coronary Artery Disease   Atrial Flutter   Hypertension   DOE   Follow-up    2 week     HPI  65 y.o. male with hypertension, paroxysmal Afib, moderate nonobstructive coronary artery disease (cath 2012), COPD, active smoker, prior h/o polysubstance abuse (EtOH, cocaine).  Patient was hospitalized in 01/31/2020 with complaints of shortness of breath. Was found to have acute on chronic heart failure with mildly reduced ejection fraction. BNP was elevated up to 426. He was also in A. fib with mild RVR. He was cardioverted on 02/21/2020.  Patient was then lost to follow-up, no showing multiple appointments.   Patient has been unable to tolerate HCTZ in the past due to dizziness and falls.  Reported history of GI bleed in June 2022, however he never was evaluated by PCP or other provider for this, just reported blood in his stool.  Patient was seen 02/02/2021 at urgent request by his PCP.  At that time patient had been without multiple medications including metoprolol for an unknown period of time and he was complaining of worsening dyspnea and dizziness.  EKG at that visit noted atrial flutter at a rate of 111 bpm.Patient was advised at last office visit to restart metoprolol, but to hold off on resuming Xarelto given history of GI bleed. Also resumed Lasix, Imdur, and spironolactone. PCP is following closely with patient and monitoring labs. He now presents for follow-up.   Patient reports compliance with medications since last office visit including metoprolol.  He has been using pill packs from the pharmacy that his PCP set up.  Patient believes he is taking Xarelto, however according to record review from PCP did not prescribe anticoagulation and neither did our office, therefore suspect patient is not on Xarelto  at this time.   Patient reports continued significant dyspnea as well as chest pain which now radiates to his jaw and intermittent dizziness.  Denies syncope or near syncope.  EKG today reveals atrial fibrillation with rapid ventricular response which is likely etiology of patient's symptoms.  Notably discussed again with patient regarding cardiac catheterization, which she had previously refused, however he is now open to this if necessary.  Unfortunately, he continues to smoke 1 pack/day.  He is "not quite there in his head" to quit smoking yet.  Initial consultation visit HPI 07/11/2019: Patient previously seen by Dr. Cathie Olden, now wants to establish cardiac care with Kevin Orozco.   Patient has episodes of retrosternal chest pain, jaw and arm numbness, triggered by emotional stress, lasting for few minutes, resolving on its own.  His physical activity is limited due to his back pain.  He walks 100 feet twice a day with his small pet.  Does not have any chest pain during physical activity.  He has shortness of breath with minimal activity, such as tying his shoelaces.  He denies any presyncope, syncope.  He had coronary angiogram in 2012 that showed moderate nonobstructive coronary artery disease.  He has had paroxysmal atrial fibrillation in the past.  He is on 3 antianginal medications.  He does not take nitroglycerin as needed, as it makes him feel "weird".  He is currently not on aspirin, as he states that he cannot afford low-dose aspirin. He denies any recent history of hematochezia, melena.  Patient is unemployed, lives in [place].  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  Since then, he has not been working.  He states that smoking is the only thing he can do during the daytime.  He smokes 2 packs/day.  He has tried Chantix, nicotine, without relief.  He used cocaine in the past, quit in 2018 also.  He also drink 12 pack of beers a day, for 15 years, quit  several years ago.    Patient lives alone in a motel.  He has a daughter who lives in [place] who has 3 children with significant health issues.  He does not have anyone to look after him at a motel.  He is very sure that he does not want to undergo any surgery or procedures.  Matter-of-fact, he wants to be DO NOT RESUSCITATE status, states that his father also died at 43 and he feels like his time is coming some.  Current Outpatient Medications on File Prior to Visit  Medication Sig Dispense Refill   clonazePAM (KLONOPIN) 0.5 MG tablet Take 0.5 mg by mouth See admin instructions. Take 1 mg by mouth at bedtime and an additional 0.5 mg two tmes a day as needed for anxiety     diclofenac sodium (VOLTAREN) 1 % GEL Apply 2 g topically 4 (four) times daily as needed (for pain).      finasteride (PROSCAR) 5 MG tablet Take 5 mg by mouth daily.     Fluticasone-Salmeterol (ADVAIR) 250-50 MCG/DOSE AEPB Inhale 1 puff into the lungs 2 (two) times daily.     furosemide (LASIX) 40 MG tablet Take 1 tablet (40 mg total) by mouth daily as needed. For fluid weight gain- 30 tablet 0   gabapentin (NEURONTIN) 300 MG capsule Take 300 mg by mouth in the morning.     ipratropium-albuterol (DUONEB) 0.5-2.5 (3) MG/3ML SOLN Take 3 mLs by nebulization daily as needed (for shortness of breath or wheezing).     isosorbide mononitrate (IMDUR) 30 MG 24 hr tablet Take 1 tablet (30 mg total) by mouth daily. 30 tablet 0   metoprolol (TOPROL-XL) 200 MG 24 hr tablet Take 1 tablet (200 mg total) by mouth in the morning. 7 tablet 0   morphine (MS CONTIN) 30 MG 12 hr tablet Take 30 mg by mouth See admin instructions. Take 30 mg by mouth at 8 AM and 8 PM     nitroGLYCERIN (NITROSTAT) 0.4 MG SL tablet Place under the tongue every 5 (five) minutes as needed for chest pain.     oxyCODONE-acetaminophen (PERCOCET) 10-325 MG tablet Take 1 tablet by mouth 4 (four) times daily as needed for pain.   0   pantoprazole (PROTONIX) 40 MG  tablet Take 40 mg by mouth daily before breakfast.     predniSONE (DELTASONE) 10 MG tablet Take 10 mg by mouth daily with breakfast.     PROAIR HFA 108 (90 Base) MCG/ACT inhaler Inhale 2 puffs into the lungs every 6 (six) hours as needed for wheezing or shortness of breath.     ranolazine (RANEXA) 500 MG 12 hr tablet TAKE ONE TABLET BY MOUTH TWICE DAILY 60 tablet 6   rosuvastatin (CRESTOR) 10 MG tablet Take 1 tablet (10 mg total) by mouth daily. 90 tablet 1   SPIRIVA HANDIHALER 18 MCG inhalation capsule Place 18 mcg into inhaler and inhale daily.     spironolactone (ALDACTONE) 25 MG tablet Take 1 tablet (25 mg total) by  mouth daily. 30 tablet 0   SYMBICORT 160-4.5 MCG/ACT inhaler Inhale 1 puff into the lungs as needed.     tamsulosin (FLOMAX) 0.4 MG CAPS capsule Take 0.4 mg by mouth daily.  0   traZODone (DESYREL) 100 MG tablet Take 100 mg by mouth at bedtime.     TRELEGY ELLIPTA 200-62.5-25 MCG/INH AEPB Inhale 1 puff into the lungs daily. (Patient not taking: Reported on 02/02/2021)     No current facility-administered medications on file prior to visit.    Cardiovascular and other pertinent studies: EKG 02/23/2021: Atrial fibrillation with rapid ventricular response at a rate of 121 bpm.  PCV ECHOCARDIOGRAM COMPLETE 02/13/2021 Left ventricle cavity is normal in size. Moderate concentric hypertrophy of the left ventricle. Normal global wall motion. Normal LV systolic function with visual EF 50-55%. Diastolic function not assessed due to atrial fibrillation. Left atrial cavity is mildly dilated. Trileaflet aortic valve. Moderate aortic valve leaflet calcification. Mild (Grade I) aortic regurgitation. Mild mitral valve leaflet calcification. Mildly restricted mitral valve leaflets without significant stenosis. Moderate (Grade III) mitral regurgitation. Mild to moderate tricuspid regurgitation. Estimated pulmonary artery systolic pressure 29 mmHg. Mild to moderate pulmonic regurgitation. No  significant change compared to previous study in 2021.  EKG 02/06/2021:  Atrial flutter at a rate of 111 bpm  EKG 03/08/2020: Sinus rhyth 71 bpm Left atrial enlargement Occasional PVC  EKG 12/24/2019: Atrial fibrillation 91 bpm  EKG 07/12/2019: Atrial fibrillation, controlled ventricular rate 90 bpm. Nonspecific T-abnormality.   Coronary angiography 2012: LM: Normal LAD: Ostial 40-50% stenosis, mid LAD 30% stenosis LCx: OM2 30% stenosis RCA: Mid 20% stenosis and posterior lateral branch   Recent labs: CMP Latest Ref Rng & Units 02/22/2020 02/21/2020 02/20/2020  Glucose 70 - 99 mg/dL 103(H) 89 104(H)  BUN 8 - 23 mg/dL 17 10 9   Creatinine 0.61 - 1.24 mg/dL 1.06 0.88 0.92  Sodium 135 - 145 mmol/L 135 137 136  Potassium 3.5 - 5.1 mmol/L 4.4 3.8 4.0  Chloride 98 - 111 mmol/L 100 102 104  CO2 22 - 32 mmol/L 26 24 26   Calcium 8.9 - 10.3 mg/dL 8.9 9.0 9.0  Total Protein 6.5 - 8.1 g/dL - - -  Total Bilirubin 0.3 - 1.2 mg/dL - - -  Alkaline Phos 38 - 126 U/L - - -  AST 15 - 41 U/L - - -  ALT 17 - 63 U/L - - -   CBC Latest Ref Rng & Units 02/18/2020 02/26/2015 02/25/2015  WBC 4.0 - 10.5 K/uL 5.3 6.9 4.1  Hemoglobin 13.0 - 17.0 g/dL 11.6(L) 11.3(L) 12.8(L)  Hematocrit 39.0 - 52.0 % 35.8(L) 33.8(L) 39.0  Platelets 150 - 400 K/uL 150 122(L) 129(L)   Lipid Panel     Component Value Date/Time   CHOL 125 04/20/2012 0500   TRIG 100 04/20/2012 0500   HDL 36 (L) 04/20/2012 0500   CHOLHDL 3.5 04/20/2012 0500   VLDL 20 04/20/2012 0500   LDLCALC 69 04/20/2012 0500   HEMOGLOBIN A1C Lab Results  Component Value Date   HGBA1C 5.4 09/09/2019   MPG 97 02/22/2015   TSH No results for input(s): TSH in the last 8760 hours.  Other labs:  02/01/2021: BUN 14, creatinine 0.87, GFR >60, sodium 142, potassium 3.8, AST 32, ALT 27 Total cholesterol 101, triglycerides 41, HDL 47, LDL 43 BNP pending  09/09/2019: HbA1C 5.4%  12/01/2018: H/H 13/38.2. MCV 97. Platelets 157   Review of  Systems  Constitutional: Positive for malaise/fatigue. Negative for weight gain.  Cardiovascular:  Positive for chest pain, dyspnea on exertion and palpitations. Negative for claudication, leg swelling, near-syncope, orthopnea, paroxysmal nocturnal dyspnea and syncope.  Respiratory:  Positive for shortness of breath.   Neurological:  Positive for dizziness.        Vitals:   02/23/21 0910 02/23/21 0911  BP:    Pulse:    Resp:    Temp:    SpO2: 93% 93%     Body mass index is 28.45 kg/m. Filed Weights   02/23/21 0905  Weight: 204 lb (92.5 kg)     Objective:   Physical Exam Vitals and nursing note reviewed.  Constitutional:      Appearance: He is well-developed.  HENT:     Head: Normocephalic and atraumatic.  Neck:     Vascular: No JVD.  Cardiovascular:     Rate and Rhythm: Tachycardia present. Rhythm irregular.     Pulses: Intact distal pulses.     Heart sounds: Normal heart sounds, S1 normal and S2 normal. No murmur heard.   No gallop.  Pulmonary:     Effort: No respiratory distress.     Breath sounds: Normal breath sounds. No wheezing, rhonchi or rales.     Comments: Patient dyspneic while walking in the hallway Musculoskeletal:     Right lower leg: No edema.     Left lower leg: No edema.  Neurological:     General: No focal deficit present.     Mental Status: He is alert.     Cranial Nerves: No cranial nerve deficit.       Assessment & Recommendations:   65 y.o. male with hypertension, paroxysmal Afib, moderate nonobstructive coronary artery disease (cath 2012), COPD, active smoker, prior h/o polysubstance abuse (EtOH, cocaine).   Persistent atrial fibrillation/flutter: CHA2DS2VASc score 2, annual stroke risk 2%. Patient presently in atrial fibrillation with RVR.  Given patient's significant symptoms discussed with patient management options of outpatient initiation of anticoagulation and diltiazem versus inpatient admission.  Shared decision was to  proceed with further evaluation and management in the hospital.  Patient will go from our office to the emergency department.  Recommend troponin to evaluate for ACS Recommend rate control strategy at this time  Could consider initiation of anticoagulation  Also discussed with patient possibility of TEE-guided cardioversion given his significant symptoms. Patient is open to this on inpatient basis if indicated.  We will plan to follow patient inpatient for atrial fibrillation with RVR.   CAD with stable angina: Continue beta-blocker and statin therapy. Continue Ranexa Recommend evaluating for ACS given chest pain, however suspect this is secondary to atrial fibrillation with RVR. Patient is not presently on aspirin given blood in his stool My 2022.  HFpEF: Repeat echocardiogram noted preserved LVEF at 50-55%.  Viewed and discussed with patient results of echocardiogram, details above.  No significant change in echocardiogram since 2021. Recommend BNP at hospital   Hypertension: Well controlled.   Patient was seen in collaboration with Dr. Virgina Jock. He also reviewed patient's chart and examined the patient. Dr. Virgina Jock is in agreement of the plan.    Alethia Berthold, PA-C 02/23/2021, 11:39 AM Office: [phone]

## 2021-02-23 ENCOUNTER — Ambulatory Visit: Payer: Medicaid Other | Admitting: Student

## 2021-02-23 ENCOUNTER — Emergency Department (HOSPITAL_COMMUNITY): Payer: Medicaid Other

## 2021-02-23 ENCOUNTER — Other Ambulatory Visit: Payer: Self-pay

## 2021-02-23 ENCOUNTER — Encounter: Payer: Self-pay | Admitting: Student

## 2021-02-23 ENCOUNTER — Emergency Department (HOSPITAL_COMMUNITY)
Admission: EM | Admit: 2021-02-23 | Discharge: 2021-02-23 | Disposition: A | Discharge status: Home / Self Care | Payer: Medicaid Other | Attending: Emergency Medicine | Admitting: Emergency Medicine

## 2021-02-23 VITALS — BP 105/64 | HR 50 | Temp 97.3°F | Resp 16 | Ht 71.0 in | Wt 204.0 lb

## 2021-02-23 DIAGNOSIS — I5023 Acute on chronic systolic (congestive) heart failure: Secondary | ICD-10-CM | POA: Diagnosis not present

## 2021-02-23 DIAGNOSIS — E1122 Type 2 diabetes mellitus with diabetic chronic kidney disease: Secondary | ICD-10-CM | POA: Insufficient documentation

## 2021-02-23 DIAGNOSIS — I25118 Atherosclerotic heart disease of native coronary artery with other forms of angina pectoris: Secondary | ICD-10-CM

## 2021-02-23 DIAGNOSIS — I4891 Unspecified atrial fibrillation: Secondary | ICD-10-CM | POA: Insufficient documentation

## 2021-02-23 DIAGNOSIS — I480 Paroxysmal atrial fibrillation: Secondary | ICD-10-CM

## 2021-02-23 DIAGNOSIS — I5032 Chronic diastolic (congestive) heart failure: Secondary | ICD-10-CM

## 2021-02-23 DIAGNOSIS — J449 Chronic obstructive pulmonary disease, unspecified: Secondary | ICD-10-CM | POA: Insufficient documentation

## 2021-02-23 DIAGNOSIS — N182 Chronic kidney disease, stage 2 (mild): Secondary | ICD-10-CM | POA: Insufficient documentation

## 2021-02-23 DIAGNOSIS — I2510 Atherosclerotic heart disease of native coronary artery without angina pectoris: Secondary | ICD-10-CM | POA: Diagnosis not present

## 2021-02-23 DIAGNOSIS — F17210 Nicotine dependence, cigarettes, uncomplicated: Secondary | ICD-10-CM | POA: Insufficient documentation

## 2021-02-23 DIAGNOSIS — E1142 Type 2 diabetes mellitus with diabetic polyneuropathy: Secondary | ICD-10-CM | POA: Insufficient documentation

## 2021-02-23 DIAGNOSIS — I130 Hypertensive heart and chronic kidney disease with heart failure and stage 1 through stage 4 chronic kidney disease, or unspecified chronic kidney disease: Secondary | ICD-10-CM | POA: Insufficient documentation

## 2021-02-23 DIAGNOSIS — Z20822 Contact with and (suspected) exposure to covid-19: Secondary | ICD-10-CM | POA: Diagnosis not present

## 2021-02-23 DIAGNOSIS — Z79899 Other long term (current) drug therapy: Secondary | ICD-10-CM | POA: Diagnosis not present

## 2021-02-23 DIAGNOSIS — I10 Essential (primary) hypertension: Secondary | ICD-10-CM

## 2021-02-23 DIAGNOSIS — R0602 Shortness of breath: Secondary | ICD-10-CM

## 2021-02-23 LAB — COMPREHENSIVE METABOLIC PANEL
ALT: 24 U/L (ref 0–44)
AST: 27 U/L (ref 15–41)
Albumin: 3.7 g/dL (ref 3.5–5.0)
Alkaline Phosphatase: 47 U/L (ref 38–126)
Anion gap: 7 (ref 5–15)
BUN: 14 mg/dL (ref 8–23)
CO2: 26 mmol/L (ref 22–32)
Calcium: 9.3 mg/dL (ref 8.9–10.3)
Chloride: 103 mmol/L (ref 98–111)
Creatinine, Ser: 1.19 mg/dL (ref 0.61–1.24)
GFR, Estimated: >60 mL/min (ref >60)
Glucose, Bld: 119 mg/dL — ABNORMAL HIGH (ref 70–99)
Potassium: 4.3 mmol/L (ref 3.5–5.1)
Sodium: 136 mmol/L (ref 135–145)
Total Bilirubin: 1.5 mg/dL — ABNORMAL HIGH (ref 0.3–1.2)
Total Protein: 7.4 g/dL (ref 6.5–8.1)

## 2021-02-23 LAB — CBC
HCT: 39.8 % (ref 39.0–52.0)
Hemoglobin: 13.2 g/dL (ref 13.0–17.0)
MCH: 34.5 pg — ABNORMAL HIGH (ref 26.0–34.0)
MCHC: 33.2 g/dL (ref 30.0–36.0)
MCV: 103.9 fL — ABNORMAL HIGH (ref 80.0–100.0)
Platelets: 147 10*3/uL — ABNORMAL LOW (ref 150–400)
RBC: 3.83 MIL/uL — ABNORMAL LOW (ref 4.22–5.81)
RDW: 14.2 % (ref 11.5–15.5)
WBC: 8.8 10*3/uL (ref 4.0–10.5)
nRBC: 0 % (ref 0.0–0.2)

## 2021-02-23 LAB — RESP PANEL BY RT-PCR (FLU A&B, COVID) ARPGX2
Influenza A by PCR: NEGATIVE
Influenza B by PCR: NEGATIVE
SARS Coronavirus 2 by RT PCR: NEGATIVE

## 2021-02-23 LAB — TROPONIN I (HIGH SENSITIVITY)
Troponin I (High Sensitivity): 7 ng/L (ref <18)
Troponin I (High Sensitivity): 8 ng/L (ref <18)

## 2021-02-23 LAB — MAGNESIUM: Magnesium: 1.8 mg/dL (ref 1.7–2.4)

## 2021-02-23 LAB — BRAIN NATRIURETIC PEPTIDE: B Natriuretic Peptide: 510.6 pg/mL — ABNORMAL HIGH (ref 0.0–100.0)

## 2021-02-23 MED ORDER — KETAMINE HCL 50 MG/5ML IJ SOSY
90.0000 mg | PREFILLED_SYRINGE | INTRAMUSCULAR | Status: DC | PRN
  Filled 2021-02-23: qty 10

## 2021-02-23 MED ORDER — KETAMINE HCL 10 MG/ML IJ SOLN
INTRAMUSCULAR | Status: AC | PRN
  Administered 2021-02-23: 50 mg via INTRAVENOUS
  Administered 2021-02-23 (×2): 10 mg via INTRAVENOUS

## 2021-02-23 MED ORDER — MORPHINE SULFATE (PF) 4 MG/ML IV SOLN
4.0000 mg | Freq: Once | INTRAVENOUS | Status: AC
  Administered 2021-02-23: 4 mg via INTRAVENOUS
  Filled 2021-02-23: qty 1

## 2021-02-23 MED ORDER — MULTAQ 400 MG PO TABS: 400.0000 mg | ORAL_TABLET | Freq: Two times a day (BID) | ORAL | 0 refills | Status: DC

## 2021-02-23 MED ORDER — SODIUM CHLORIDE 0.9 % IV SOLN: INTRAVENOUS | Status: DC

## 2021-02-23 NOTE — Sedation Documentation (Signed)
200J given

## 2021-02-23 NOTE — Discharge Instructions (Addendum)
You have been seen and discharged from the emergency department.  Take new medication as prescribed.  It is extremely important that you are compliant with his medication to keep your heart in a normal rhythm.  Follow-up with cardiology for reevaluation and further care. Take home medications as prescribed. If you have any worsening symptoms or further concerns for your health please return to an emergency department for further evaluation.

## 2021-02-23 NOTE — ED Notes (Signed)
Patient verbalizes understanding of discharge instructions. Prescriptions and follow-up care reviewed. Opportunity for questioning and answers were provided. Armband removed by staff, pt discharged from ED ambulatory.  

## 2021-02-23 NOTE — ED Triage Notes (Addendum)
Pt arrived POV, pt seen by PCP today after being in uncontrolled afib, change in medication 2 weeks ago with no success. Constant chest pressure with intermittent shob. Pt reports possible cardioversion today per cards.  Pt A & O.

## 2021-02-23 NOTE — ED Provider Notes (Signed)
[facility]   CSN: [id number] Arrival date & time: 02/23/21  1133     History Chief Complaint  Patient presents with   Atrial Fibrillation    Kevin Orozco is a 65 y.o. male.  HPI  65 year old male with past medical history of CAD, paroxysmal atrial fibrillation on anticoagulation, HTN, HLD presents emergency department with symptomatic atrial fibrillation.  Patient sent in by cardiology.  He has been intermittently in A. fib for the past couple months, in the office today was noted to be A. fib with RVR and sent to the ED for evaluation.  Patient complains of intermittent chest pain and shortness of breath.  No swelling of his lower extremities.  He has required cardioversion in the past.  Past Medical History:  Diagnosis Date   Acute respiratory distress 02/22/2015   Atrial fibrillation with RVR (HCC) 04/19/12   New onset; spontaneous conversion to NSR; Pradaxa anticoagulation   CAD (coronary artery disease)    a. Cath 2008- nonobstructive, mod LAD, mild LCx & RCA disease b. Cardiolite 2010 normal c. 03/2012 abnormal Lexiscan Myoview attributed to cocaine d. 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   Elevated LFTs in the setting of acute EtOH abuse, no acute findings on abdominal u/s, HCV+, suspected acute alcohilic heptatitis   HTN (hypertension)    Hypercholesteremia    PAF (paroxysmal atrial fibrillation) (HCC) 02/22/2015   Tobacco abuse     Patient Active Problem List   Diagnosis Date Noted   Longstanding persistent atrial fibrillation (HCC) 03/08/2020   Acute CHF (congestive heart failure) (HCC) 02/18/2020   Hypokalemia 02/18/2020   Atrial fibrillation with RVR (HCC) 02/18/2020   Acute on chronic systolic CHF (congestive heart failure) (HCC) 02/18/2020   CHF (congestive heart  failure) (HCC) 02/18/2020   Chest pain 07/09/2019   Claudication (HCC) 09/07/2015   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HCC)    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)    Chronic heart failure with preserved ejection fraction (HCC)    Tobacco abuse    Polysubstance abuse (HCC)    Marijuana abuse    Chronic obstructive pulmonary disease (HCC)    Acute respiratory failure with hypoxia and hypercapnia (HCC)    Hepatitis, viral    Respiratory failure (HCC) 02/22/2015   Cocaine abuse (HCC) 04/24/2012   Paroxysmal atrial fibrillation (HCC) 04/24/2012   Coronary vasospasm (HCC) 04/24/2012   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   Transaminitis 04/20/2012   CAD (coronary artery disease) 04/19/2012   Hyperlipidemia 04/19/2012   Paroxysmal A-fib (HCC) 04/19/2012   Essential hypertension    Alcohol abuse     Past Surgical History:  Procedure Laterality Date   APPENDECTOMY     CARDIOVERSION N/A 02/21/2020   Procedure: CARDIOVERSION;  Surgeon: Yates DecampGanji, Jay, MD;  [facility];  Service: Cardiovascular;  Laterality: N/A;   FOOT SURGERY         Family History  Problem Relation Age of Onset   Heart attack Father 9565   Diabetes Mother 3356   Diabetes Sister    Heart attack Sister     Social History   Tobacco Use   Smoking status: Every Day  Packs/day: 1.00    Years: 40.00    Pack years: 40.00    Types: Cigarettes   Smokeless tobacco: Never  Vaping Use   Vaping Use: Never used  Substance Use Topics   Alcohol use: Not Currently    Alcohol/week: 12.0 standard drinks    Types: 12 Standard drinks or equivalent per week   Drug use: Yes    Types: Cocaine, Morphine, Oxycodone    Comment: Currently on Hydrocodone and Morphine, not Cocaine    Home Medications Prior to Admission medications   Medication Sig Start Date End Date Taking? Authorizing Provider   clonazePAM (KLONOPIN) 0.5 MG tablet Take 0.5-1 mg by mouth See admin instructions. Take 1 mg by mouth at bedtime and an additional 0.5-1 mg in the morning as needed for anxiety   Yes [provider]  diclofenac sodium (VOLTAREN) 1 % GEL Apply 2 g topically 4 (four) times daily as needed (to painful sites).   Yes [provider]  finasteride (PROSCAR) 5 MG tablet Take 5 mg by mouth at bedtime.   Yes [provider]  furosemide (LASIX) 40 MG tablet Take 1 tablet (40 mg total) by mouth daily as needed. For fluid weight gain- Patient taking differently: Take 40 mg by mouth in the morning. 02/22/20 03/25/21 Yes Calvert Cantor, MD  gabapentin (NEURONTIN) 300 MG capsule Take 300 mg by mouth at bedtime.   Yes [provider]  ipratropium-albuterol (DUONEB) 0.5-2.5 (3) MG/3ML SOLN Take 3 mLs by nebulization 4 (four) times daily as needed (for shortness of breath or wheezing).   Yes [provider]  isosorbide mononitrate (IMDUR) 30 MG 24 hr tablet Take 1 tablet (30 mg total) by mouth daily. 02/26/15  Yes Drema Dallas, MD  lubiprostone (AMITIZA) 8 MCG capsule Take 8 mcg by mouth daily with breakfast.   Yes [provider]  morphine (MS CONTIN) 30 MG 12 hr tablet Take 30 mg by mouth See admin instructions. Take 30 mg by mouth at 8 AM and 8 PM   Yes [provider]  Naloxone HCl (KLOXXADO) 8 MG/0.1ML LIQD Place into the nose as needed (for accidental overdose).   Yes [provider]  nitroGLYCERIN (NITROSTAT) 0.4 MG SL tablet Place under the tongue every 5 (five) minutes as needed for chest pain.   Yes [provider]  oxyCODONE HCl 15 MG TABA Take 15 mg by mouth See admin instructions. Take 15 mg by mouth every 4-6 hours as needed for pain   Yes [provider]  pantoprazole (PROTONIX) 40 MG tablet Take 40 mg by mouth daily before breakfast.   Yes [provider]  predniSONE (DELTASONE) 10 MG tablet Take 10 mg by  mouth daily with breakfast.   Yes [provider]  PROAIR HFA 108 (90 Base) MCG/ACT inhaler Inhale 1-2 puffs into the lungs every 6 (six) hours as needed for wheezing or shortness of breath.   Yes [provider]  ranolazine (RANEXA) 500 MG 12 hr tablet TAKE ONE TABLET BY MOUTH TWICE DAILY Patient taking differently: Take 500 mg by mouth in the morning and at bedtime. 02/06/21  Yes Cantwell, Celeste C, PA-C  rosuvastatin (CRESTOR) 10 MG tablet Take 1 tablet (10 mg total) by mouth daily. 01/08/21 04/08/21 Yes [name], [name], MD  sertraline (ZOLOFT) 50 MG tablet Take 50 mg by mouth daily.   Yes [provider]  tamsulosin (FLOMAX) 0.4 MG CAPS capsule Take 0.4 mg by mouth at bedtime. 08/21/16  Yes  [provider]  traZODone (DESYREL) 100 MG tablet Take 50-100 mg by mouth at bedtime as needed for sleep.   Yes [provider]  TRELEGY ELLIPTA 200-62.5-25 MCG/INH AEPB Inhale 1 puff into the lungs daily. 01/31/21  Yes [provider]  XARELTO 20 MG TABS tablet Take 20 mg by mouth daily after lunch.   Yes [provider]  metoprolol (TOPROL-XL) 200 MG 24 hr tablet Take 1 tablet (200 mg total) by mouth in the morning. Patient not taking: No sig reported 02/02/21   Cantwell, Celeste C, PA-C  spironolactone (ALDACTONE) 25 MG tablet Take 1 tablet (25 mg total) by mouth daily. 02/23/20   Debbe Odea, MD    Allergies    Tape  Review of Systems   Review of Systems  Constitutional:  Positive for diaphoresis. Negative for chills and fever.  HENT:  Negative for congestion.   Eyes:  Negative for visual disturbance.  Respiratory:  Positive for shortness of breath.   Cardiovascular:  Negative for chest pain, palpitations and leg swelling.  Gastrointestinal:  Negative for abdominal pain, diarrhea and vomiting.  Genitourinary:  Negative for dysuria.  Skin:  Negative for rash.  Neurological:  Negative for headaches.   Physical Exam Updated  Vital Signs BP 110/77 (BP Location: Right Arm)   Pulse 64   Temp 97.6 F (36.4 C) (Oral)   Resp 20   SpO2 98%   Physical Exam Vitals and nursing note reviewed.  Constitutional:      General: He is not in acute distress.    Appearance: Normal appearance. He is not diaphoretic.  HENT:     Head: Normocephalic.     Mouth/Throat:     Mouth: Mucous membranes are moist.  Cardiovascular:     Rate and Rhythm: Normal rate. Rhythm irregular.  Pulmonary:     Effort: Pulmonary effort is normal. No respiratory distress.  Abdominal:     Palpations: Abdomen is soft.     Tenderness: There is no abdominal tenderness.  Musculoskeletal:        General: No swelling.  Skin:    General: Skin is warm.  Neurological:     Mental Status: He is alert and oriented to person, place, and time. Mental status is at baseline.  Psychiatric:        Mood and Affect: Mood normal.    ED Results / Procedures / Treatments   Labs (all labs ordered are listed, but only abnormal results are displayed) Labs Reviewed  CBC - Abnormal; Notable for the following components:      Result Value   RBC 3.83 (*)    MCV 103.9 (*)    MCH 34.5 (*)    Platelets 147 (*)    All other components within normal limits  COMPREHENSIVE METABOLIC PANEL - Abnormal; Notable for the following components:   Glucose, Bld 119 (*)    Total Bilirubin 1.5 (*)    All other components within normal limits  BRAIN NATRIURETIC PEPTIDE - Abnormal; Notable for the following components:   B Natriuretic Peptide 510.6 (*)    All other components within normal limits  RESP PANEL BY RT-PCR (FLU A&B, COVID) ARPGX2  MAGNESIUM  TROPONIN I (HIGH SENSITIVITY)  TROPONIN I (HIGH SENSITIVITY)    EKG None  Radiology DG Chest 2 View  Result Date: 02/23/2021 CLINICAL DATA:  65 year old male with history of shortness of breath. COPD. EXAM: CHEST - 2 VIEW COMPARISON:  Chest x-ray 02/18/2020. FINDINGS: Extensive pericardial calcifications are again  noted. Chronic blunting of the right costophrenic sulcus, suggestive of chronic right-sided pleuroparenchymal scarring, similar to the prior study. No definite pleural effusions. No consolidative airspace disease. No pneumothorax. No evidence of pulmonary edema. Heart size is borderline enlarged. Upper mediastinal contours are within normal limits. IMPRESSION: 1. 1. No definite radiographic evidence of acute cardiopulmonary disease. 2. Extensive chronic right-sided pleuroparenchymal scarring. 3. Chronic pericardial calcifications. Clinical correlation for signs and symptoms of constrictive physiology is recommended. Further evaluation with echocardiography should be considered if clinically appropriate. Electronically Signed   By: Vinnie Langton M.D.   On: 02/23/2021 12:49    Procedures .Cardioversion  Date/Time: 02/23/2021 8:10 PM Performed by: Lorelle Gibbs, DO Authorized by: Lorelle Gibbs, DO   Consent:    Consent obtained:  Written   Consent given by:  Patient   Risks discussed:  Cutaneous burn, induced arrhythmia and pain   Alternatives discussed:  No treatment Pre-procedure details:    Cardioversion basis:  Elective   Rhythm:  Atrial fibrillation   Electrode placement:  Anterior-posterior Patient sedated: Yes. Refer to sedation procedure documentation for details of sedation.  Attempt one:    Cardioversion mode:  Synchronous   Waveform:  Monophasic   Shock (Joules):  200   Shock outcome:  Conversion to normal sinus rhythm Post-procedure details:    Patient status:  Awake   Patient tolerance of procedure:  Tolerated well, no immediate complications .Sedation  Date/Time: 02/23/2021 8:12 PM Performed by: Lorelle Gibbs, DO Authorized by: Lorelle Gibbs, DO   Consent:    Consent obtained:  Written   Consent given by:  Patient   Risks discussed:  Allergic reaction, prolonged hypoxia resulting in organ damage, prolonged sedation necessitating reversal and  respiratory compromise necessitating ventilatory assistance and intubation Universal protocol:    Immediately prior to procedure, a time out was called: yes   Indications:    Procedure performed:  Cardioversion   Procedure necessitating sedation performed by:  Physician performing sedation Pre-sedation assessment:    Time since last food or drink:  8   ASA classification: class 2 - patient with mild systemic disease     Mouth opening:  2 finger widths   Thyromental distance:  2 finger widths   Mallampati score:  II - soft palate, uvula, fauces visible   Neck mobility: normal     Pre-sedation assessments completed and reviewed: pre-procedure airway patency not reviewed, pre-procedure cardiovascular function not reviewed and pre-procedure mental status not reviewed   Immediate pre-procedure details:    Reviewed: vital signs     Verified: emergency equipment available and oxygen available   Procedure details (see MAR for exact dosages):    Preoxygenation:  Nasal cannula   Sedation:  Ketamine   Intended level of sedation: deep and moderate (conscious sedation)   Analgesia:  Morphine   Intra-procedure monitoring:  Blood pressure monitoring, continuous capnometry, frequent LOC assessments, continuous pulse oximetry, cardiac monitor and frequent vital sign checks   Intra-procedure events: none     Total Provider sedation time (minutes):  20 Post-procedure details:    Post-sedation assessments completed and reviewed: post-procedure airway patency not reviewed, post-procedure cardiovascular function not reviewed, post-procedure mental status not reviewed and post-procedure respiratory function not reviewed     Patient is stable for discharge or admission: yes     Procedure completion:  Tolerated well, no immediate complications .Critical Care Performed by: Lorelle Gibbs, DO Authorized by: Lorelle Gibbs, DO   Critical care provider statement:  Critical care time (minutes):  45    Critical care time was exclusive of:  Separately billable procedures and treating other patients   Critical care was necessary to treat or prevent imminent or life-threatening deterioration of the following conditions:  Cardiac failure   Critical care was time spent personally by me on the following activities:  Ordering and performing treatments and interventions, ordering and review of laboratory studies, pulse oximetry, re-evaluation of patient's condition, examination of patient, evaluation of patient's response to treatment and discussions with consultants   I assumed direction of critical care for this patient from another provider in my specialty: no     Care discussed with: admitting provider     Medications Ordered in ED Medications  ketamine 50 mg in normal saline 5 mL (10 mg/mL) syringe (has no administration in time range)  0.9 %  sodium chloride infusion ( Intravenous New Bag/Given 02/23/21 1923)  ketamine (KETALAR) injection (10 mg Intravenous Given 02/23/21 1939)  morphine 4 MG/ML injection 4 mg (4 mg Intravenous Given 02/23/21 1921)    ED Course  I have reviewed the triage vital signs and the nursing notes.  Pertinent labs & imaging results that were available during my care of the patient were reviewed by me and considered in my medical decision making (see chart for details).    MDM Rules/Calculators/A&P                           65 year old male presents the emergency department with symptomatic atrial fibrillation.  Required cardioversion in the past.  Has been compliant with his anticoagulation.  Came to the ER at the request of cardiology.  Blood work and electrolytes are reassuring, troponin is negative, vitals are stable.  Cardiology recommends cardioversion in the ER due to symptomatic nature.  Patient tolerated procedural sedation without complication, cardioversion resulted in normal sinus rhythm, stable blood pressure.  Will monitor.  Patient remains in normal  sinus rhythm, vitals are stable.  Patient tolerated procedural sedation without complication.  Cardiology has been notified successful cardioversion, recommend outpatient office follow-up.  Patient at this time appears safe and stable for discharge and will be treated as an outpatient.  Discharge plan and strict return to ED precautions discussed, patient verbalizes understanding and agreement.  Final Clinical Impression(s) / ED Diagnoses Final diagnoses:  None    Rx / DC Orders ED Discharge Orders     None        Lorelle Gibbs, DO 02/23/21 2018

## 2021-02-23 NOTE — ED Provider Notes (Signed)
Emergency Medicine Provider Triage Evaluation Note  Kevin Orozco , a 65 y.o. male  was evaluated in triage.  Pt complains of chest pain and shortness of breath.  Has been intermittent for several months.  Saw his cardiologist today, was in A. fib with RVR.  Does not appear that he is on anticoagulation.  Per cardiology, they recommend admission.  Request troponin due to patient's chest pain radiating to his jaw  Review of Systems  Positive: Cp, sob Negative: fever  Physical Exam  BP 103/80 (BP Location: Right Arm)   Pulse (!) 117   Temp 98 F (36.7 C) (Oral)   Resp 20   SpO2 99%  Gen:   Awake, no distress   Resp:  Normal effort  MSK:   Moves extremities without difficulty  Other:  Tachycardic and irregularly irregular  Medical Decision Making  Medically screening exam initiated at 12:20 PM.  Appropriate orders placed.  Kevin Orozco was informed that the remainder of the evaluation will be completed by another provider, this initial triage assessment does not replace that evaluation, and the importance of remaining in the ED until their evaluation is complete.  Labs, cxr, ekg   Kevin Apley, PA-C 02/23/21 1221    Mancel Bale, MD 02/24/21 1725

## 2021-02-26 ENCOUNTER — Ambulatory Visit (HOSPITAL_COMMUNITY): Admit: 2021-02-26 | Payer: Medicaid Other | Admitting: Cardiology

## 2021-02-26 ENCOUNTER — Encounter (HOSPITAL_COMMUNITY): Payer: Self-pay

## 2021-02-26 ENCOUNTER — Encounter (HOSPITAL_COMMUNITY): Payer: Self-pay | Admitting: Anesthesiology

## 2021-02-26 SURGERY — ECHOCARDIOGRAM, TRANSESOPHAGEAL
Anesthesia: Monitor Anesthesia Care

## 2021-02-26 NOTE — Anesthesia Preprocedure Evaluation (Deleted)
Anesthesia Evaluation    Reviewed: Allergy & Precautions, Patient's Chart, lab work & pertinent test results, reviewed documented beta blocker date and time   Airway        Dental   Pulmonary neg pulmonary ROS, Current Smoker,           Cardiovascular hypertension, Pt. on home beta blockers and Pt. on medications + CAD and +CHF  + dysrhythmias Atrial Fibrillation  Rhythm:Irregular  TTE 2016 - Technically difficult; vigorous LV function; LVOT gradient of 2.7  m/s likely related to vigorous LV function; moderate LVH; mild MR  and TR; moderate LAE   Neuro/Psych negative neurological ROS  negative psych ROS   GI/Hepatic negative GI ROS, (+)     substance abuse  alcohol use, cocaine use and marijuana use, Hepatitis -, C  Endo/Other  diabetes  Renal/GU negative Renal ROS  negative genitourinary   Musculoskeletal negative musculoskeletal ROS (+)   Abdominal   Peds  Hematology  (+) Blood dyscrasia (on xarelto), ,   Anesthesia Other Findings Presented to ED on 02/23/21 with chest pain and SOB found to have afib with RVR. Has not been taking xarelto.   Reproductive/Obstetrics                             Anesthesia Physical Anesthesia Plan  ASA: 3  Anesthesia Plan: MAC   Post-op Pain Management:    Induction: Intravenous  PONV Risk Score and Plan: Propofol infusion and Treatment may vary due to age or medical condition  Airway Management Planned: Natural Airway  Additional Equipment:   Intra-op Plan:   Post-operative Plan:   Informed Consent: I have reviewed the patients History and Physical, chart, labs and discussed the procedure including the risks, benefits and alternatives for the proposed anesthesia with the patient or authorized representative who has indicated his/her understanding and acceptance.     Dental advisory given  Plan Discussed with: CRNA  Anesthesia Plan  Comments:         Anesthesia Quick Evaluation

## 2021-03-05 ENCOUNTER — Other Ambulatory Visit: Payer: Self-pay

## 2021-03-05 ENCOUNTER — Encounter: Payer: Self-pay | Admitting: Student

## 2021-03-05 ENCOUNTER — Ambulatory Visit: Payer: Medicaid Other | Admitting: Student

## 2021-03-05 VITALS — BP 136/79 | HR 56 | Temp 98.0°F | Resp 16 | Ht 71.0 in | Wt 209.0 lb

## 2021-03-05 DIAGNOSIS — I5032 Chronic diastolic (congestive) heart failure: Secondary | ICD-10-CM

## 2021-03-05 DIAGNOSIS — I480 Paroxysmal atrial fibrillation: Secondary | ICD-10-CM

## 2021-03-05 DIAGNOSIS — I25118 Atherosclerotic heart disease of native coronary artery with other forms of angina pectoris: Secondary | ICD-10-CM

## 2021-03-05 DIAGNOSIS — I10 Essential (primary) hypertension: Secondary | ICD-10-CM

## 2021-03-05 MED ORDER — MULTAQ 400 MG PO TABS: 400.0000 mg | ORAL_TABLET | Freq: Two times a day (BID) | ORAL | 3 refills | Status: DC

## 2021-03-05 NOTE — Progress Notes (Signed)
Patient referred by Marletta Lor, NP for chest pain  Subjective:   Kevin Orozco, male    DOB: May 12, 1955, 65 y.o.   MRN: [id number]   Chief Complaint  Patient presents with   Atrial Fibrillation   Hospitalization Follow-up     HPI  65 y.o. male with hypertension, paroxysmal Afib, moderate nonobstructive coronary artery disease (cath 2012), COPD, active smoker, prior h/o polysubstance abuse (EtOH, cocaine).  Patient was hospitalized in 01/31/2020 with complaints of shortness of breath. Was found to have acute on chronic heart failure with mildly reduced ejection fraction. BNP was elevated up to 426. He was also in A. fib with mild RVR. He was cardioverted on 02/21/2020.  Patient was then lost to follow-up, no showing multiple appointments.   Patient has been unable to tolerate HCTZ in the past due to dizziness and falls.  Reported history of GI bleed in June 2022, however he never was evaluated by PCP or other provider for this, just reported blood in his stool.  Patient was seen 02/02/2021 in our office and restarted on rate control medications given atrial fibrillation with RVR.  He presented for follow-up 11/4/2022Remained in A. fib with RVR with significant symptoms of dyspnea and fatigue.  Patient was therefore sent to the emergency department directly from our office.  Evaluation in the emergency department revealed negative troponin mildly elevated BNP, and was otherwise unremarkable.  Due to significant symptoms patient underwent direct-current cardioversion in the emergency department with successful return to normal sinus rhythm and he was discharged.  Patient now presents for follow-up.   Patient is feeling much improved now that he is maintaining sinus rhythm.  Denies dyspnea, chest pain, dizziness, fatigue.  He is tolerating Xarelto and Multaq without issue.  Denies evidence of bleeding diathesis.  Patient reports medication compliance.  Unfortunately patient does continue  to smoke approximately 1 pack/day, however he is now motivated to quit and is trying nicotine replacement therapy to assist him in this.  Current Outpatient Medications on File Prior to Visit  Medication Sig Dispense Refill   clonazePAM (KLONOPIN) 0.5 MG tablet Take 0.5-1 mg by mouth See admin instructions. Take 1 mg by mouth at bedtime and an additional 0.5-1 mg in the morning as needed for anxiety     diclofenac sodium (VOLTAREN) 1 % GEL Apply 2 g topically 4 (four) times daily as needed (to painful sites).     finasteride (PROSCAR) 5 MG tablet Take 5 mg by mouth at bedtime.     furosemide (LASIX) 40 MG tablet Take 1 tablet (40 mg total) by mouth daily as needed. For fluid weight gain- (Patient taking differently: Take 40 mg by mouth in the morning.) 30 tablet 0   gabapentin (NEURONTIN) 300 MG capsule Take 300 mg by mouth at bedtime.     ipratropium-albuterol (DUONEB) 0.5-2.5 (3) MG/3ML SOLN Take 3 mLs by nebulization 4 (four) times daily as needed (for shortness of breath or wheezing).     isosorbide mononitrate (IMDUR) 30 MG 24 hr tablet Take 1 tablet (30 mg total) by mouth daily. 30 tablet 0   lubiprostone (AMITIZA) 8 MCG capsule Take 8 mcg by mouth daily with breakfast.     metoprolol (TOPROL-XL) 200 MG 24 hr tablet Take 1 tablet (200 mg total) by mouth in the morning. 7 tablet 0   morphine (MS CONTIN) 30 MG 12 hr tablet Take 30 mg by mouth See admin instructions. Take 30 mg by mouth at 8 AM and 8 PM  Naloxone HCl (KLOXXADO) 8 MG/0.1ML LIQD Place into the nose as needed (for accidental overdose).     nitroGLYCERIN (NITROSTAT) 0.4 MG SL tablet Place under the tongue every 5 (five) minutes as needed for chest pain.     oxyCODONE HCl 15 MG TABA Take 15 mg by mouth See admin instructions. Take 15 mg by mouth every 4-6 hours as needed for pain     pantoprazole (PROTONIX) 40 MG tablet Take 40 mg by mouth daily before breakfast.     predniSONE (DELTASONE) 10 MG tablet Take 10 mg by mouth daily  with breakfast.     PROAIR HFA 108 (90 Base) MCG/ACT inhaler Inhale 1-2 puffs into the lungs every 6 (six) hours as needed for wheezing or shortness of breath.     ranolazine (RANEXA) 500 MG 12 hr tablet TAKE ONE TABLET BY MOUTH TWICE DAILY (Patient taking differently: Take 500 mg by mouth in the morning and at bedtime.) 60 tablet 6   rosuvastatin (CRESTOR) 10 MG tablet Take 1 tablet (10 mg total) by mouth daily. 90 tablet 1   sertraline (ZOLOFT) 50 MG tablet Take 50 mg by mouth daily.     spironolactone (ALDACTONE) 25 MG tablet Take 1 tablet (25 mg total) by mouth daily. 30 tablet 0   tamsulosin (FLOMAX) 0.4 MG CAPS capsule Take 0.4 mg by mouth at bedtime.  0   traZODone (DESYREL) 100 MG tablet Take 50-100 mg by mouth at bedtime as needed for sleep.     TRELEGY ELLIPTA 200-62.5-25 MCG/INH AEPB Inhale 1 puff into the lungs daily.     XARELTO 20 MG TABS tablet Take 20 mg by mouth daily after lunch.     No current facility-administered medications on file prior to visit.    Cardiovascular and other pertinent studies: EKG 03/05/2021:  Sinus rhythm at a rate of 56 bpm.  Normal axis.  Left atrial enlargement.  Poor R wave progression, cannot exclude anteroseptal infarct old. Normal QT.   Direct-current cardioversion in the ED 02/23/2021 Successful return to normal sinus rhythm from atrial fibrillation with RVR.  EKG 02/23/2021: Atrial fibrillation with rapid ventricular response at a rate of 121 bpm.  PCV ECHOCARDIOGRAM COMPLETE 02/13/2021 Left ventricle cavity is normal in size. Moderate concentric hypertrophy of the left ventricle. Normal global wall motion. Normal LV systolic function with visual EF 50-55%. Diastolic function not assessed due to atrial fibrillation. Left atrial cavity is mildly dilated. Trileaflet aortic valve. Moderate aortic valve leaflet calcification. Mild (Grade I) aortic regurgitation. Mild mitral valve leaflet calcification. Mildly restricted mitral valve leaflets  without significant stenosis. Moderate (Grade III) mitral regurgitation. Mild to moderate tricuspid regurgitation. Estimated pulmonary artery systolic pressure 29 mmHg. Mild to moderate pulmonic regurgitation. No significant change compared to previous study in 2021.  EKG 02/06/2021:  Atrial flutter at a rate of 111 bpm  EKG 03/08/2020: Sinus rhyth 71 bpm Left atrial enlargement Occasional PVC  EKG 12/24/2019: Atrial fibrillation 91 bpm  EKG 07/12/2019: Atrial fibrillation, controlled ventricular rate 90 bpm. Nonspecific T-abnormality.   Coronary angiography 2012: LM: Normal LAD: Ostial 40-50% stenosis, mid LAD 30% stenosis LCx: OM2 30% stenosis RCA: Mid 20% stenosis and posterior lateral branch   Recent labs: CMP Latest Ref Rng & Units 02/23/2021 02/22/2020 02/21/2020  Glucose 70 - 99 mg/dL 119(H) 103(H) 89  BUN 8 - 23 mg/dL 14 17 10   Creatinine 0.61 - 1.24 mg/dL 1.19 1.06 0.88  Sodium 135 - 145 mmol/L 136 135 137  Potassium 3.5 - 5.1 mmol/L  4.3 4.4 3.8  Chloride 98 - 111 mmol/L 103 100 102  CO2 22 - 32 mmol/L 26 26 24   Calcium 8.9 - 10.3 mg/dL 9.3 8.9 9.0  Total Protein 6.5 - 8.1 g/dL 7.4 - -  Total Bilirubin 0.3 - 1.2 mg/dL 1.5(H) - -  Alkaline Phos 38 - 126 U/L 47 - -  AST 15 - 41 U/L 27 - -  ALT 0 - 44 U/L 24 - -   CBC Latest Ref Rng & Units 02/23/2021 02/18/2020 02/26/2015  WBC 4.0 - 10.5 K/uL 8.8 5.3 6.9  Hemoglobin 13.0 - 17.0 g/dL 13.2 11.6(L) 11.3(L)  Hematocrit 39.0 - 52.0 % 39.8 35.8(L) 33.8(L)  Platelets 150 - 400 K/uL 147(L) 150 122(L)   Lipid Panel     Component Value Date/Time   CHOL 125 04/20/2012 0500   TRIG 100 04/20/2012 0500   HDL 36 (L) 04/20/2012 0500   CHOLHDL 3.5 04/20/2012 0500   VLDL 20 04/20/2012 0500   LDLCALC 69 04/20/2012 0500   HEMOGLOBIN A1C Lab Results  Component Value Date   HGBA1C 5.4 09/09/2019   MPG 97 02/22/2015   TSH No results for input(s): TSH in the last 8760 hours.  Other labs:  CMP Latest Ref Rng & Units  02/23/2021 02/22/2020 02/21/2020  Glucose 70 - 99 mg/dL 119(H) 103(H) 89  BUN 8 - 23 mg/dL 14 17 10   Creatinine 0.61 - 1.24 mg/dL 1.19 1.06 0.88  Sodium 135 - 145 mmol/L 136 135 137  Potassium 3.5 - 5.1 mmol/L 4.3 4.4 3.8  Chloride 98 - 111 mmol/L 103 100 102  CO2 22 - 32 mmol/L 26 26 24   Calcium 8.9 - 10.3 mg/dL 9.3 8.9 9.0  Total Protein 6.5 - 8.1 g/dL 7.4 - -  Total Bilirubin 0.3 - 1.2 mg/dL 1.5(H) - -  Alkaline Phos 38 - 126 U/L 47 - -  AST 15 - 41 U/L 27 - -  ALT 0 - 44 U/L 24 - -   CBC Latest Ref Rng & Units 02/23/2021 02/18/2020 02/26/2015  WBC 4.0 - 10.5 K/uL 8.8 5.3 6.9  Hemoglobin 13.0 - 17.0 g/dL 13.2 11.6(L) 11.3(L)  Hematocrit 39.0 - 52.0 % 39.8 35.8(L) 33.8(L)  Platelets 150 - 400 K/uL 147(L) 150 122(L)   Lipid Panel     Component Value Date/Time   CHOL 125 04/20/2012 0500   TRIG 100 04/20/2012 0500   HDL 36 (L) 04/20/2012 0500   CHOLHDL 3.5 04/20/2012 0500   VLDL 20 04/20/2012 0500   LDLCALC 69 04/20/2012 0500   HEMOGLOBIN A1C Lab Results  Component Value Date   HGBA1C 5.4 09/09/2019   MPG 97 02/22/2015   TSH No results for input(s): TSH in the last 8760 hours.  02/01/2021: BUN 14, creatinine 0.87, GFR >60, sodium 142, potassium 3.8, AST 32, ALT 27 Total cholesterol 101, triglycerides 41, HDL 47, LDL 43 BNP pending  09/09/2019: HbA1C 5.4%  12/01/2018: H/H 13/38.2. MCV 97. Platelets 157   Review of Systems  Constitutional: Negative for malaise/fatigue and weight gain.  Cardiovascular:  Negative for chest pain, claudication, leg swelling, near-syncope, orthopnea, palpitations, paroxysmal nocturnal dyspnea and syncope.  Respiratory:  Negative for shortness of breath.   Neurological:  Negative for dizziness.        Vitals:   03/05/21 0824  BP: 136/79  Pulse: (!) 56  Resp: 16  Temp: 98 F (36.7 C)  SpO2: 95%     Body mass index is 29.15 kg/m. Filed Weights   03/05/21 [phone]  Weight: 209 lb (94.8 kg)     Objective:   Physical  Exam Vitals and nursing note reviewed.  Constitutional:      Appearance: He is well-developed.  HENT:     Head: Normocephalic and atraumatic.  Neck:     Vascular: No JVD.  Cardiovascular:     Rate and Rhythm: Normal rate and regular rhythm.     Pulses: Intact distal pulses.     Heart sounds: Normal heart sounds, S1 normal and S2 normal. No murmur heard.   No gallop.  Pulmonary:     Effort: No respiratory distress.     Breath sounds: Normal breath sounds. No wheezing, rhonchi or rales.  Musculoskeletal:     Right lower leg: No edema.     Left lower leg: No edema.  Neurological:     General: No focal deficit present.     Mental Status: He is alert.     Cranial Nerves: No cranial nerve deficit.       Assessment & Recommendations:   65 y.o. male with hypertension, paroxysmal Afib, moderate nonobstructive coronary artery disease (cath 2012), COPD, active smoker, prior h/o polysubstance abuse (EtOH, cocaine).   Paroxysmal atrial fibrillation/flutter: CHA2DS2VASc score 2, annual stroke risk 2%. Tolerating anticoagulation with Xarelto without evidence of bleeding diathesis.  We will continue Xarelto Tolerating Multaq 400 mg p.o. twice daily Underwent successful direct-current cardioversion 02/23/2021.  He is presently maintaining sinus rhythm and has had resolution of dyspnea, dizziness, and fatigue. Continue metoprolol Discussed at length with patient regarding the importance of medication compliance, he verbalized understanding agreement. Reviewed and discussed with patient results of echocardiogram which noted preserved LVEF and mild to moderate valvular disease.  Also discussed with patient recommendation for further ischemic evaluation.  Discussed risks versus benefits of undergoing further ischemic evaluation given that it has been greater than 5 years since last stress test.  Patient verbalized understanding of the benefits, however despite this he wishes to hold off on further  ischemic evaluation at this time.  CAD with stable angina: Continue beta-blocker and statin therapy. Continue Ranexa and Imdur. Patient is not on aspirin given Xarelto for paroxysmal atrial fibrillation Discussed with patient recommendation for further ischemic evaluation.  Discussed risks versus benefits of undergoing further ischemic evaluation given that it has been greater than 5 years since last stress test.  Patient verbalized understanding of the benefits, however despite this he wishes to hold off on further ischemic evaluation at this time.  HFpEF: Repeat echocardiogram noted preserved LVEF at 50-55%.  No significant change in echocardiogram since 2021. BNP mildly elevated, likely secondary to atrial fibrillation with RVR There is no clinical evidence of heart failure at today's office visit. Continue metoprolol, Imdur, spironolactone, as well as Lasix.  Would recommend considering addition of ARB or Entresto, however patient is feeling well and does not wish to make changes to medications at this time.  We will reevaluate up titration of guideline directed medical therapy in the future.  Hypertension: Within acceptable range.  Patient follows closely with PCP, advised that he keep a log of blood pressure readings for review by PCP as well as our office at next visit.  Follow-up in 3 months, sooner if needed, for HFpEF, CAD, and atrial fibrillation.   Rayford Halsted, PA-C 03/05/2021, 10:05 AM Office: [phone]

## 2021-03-06 ENCOUNTER — Other Ambulatory Visit: Payer: Self-pay | Admitting: Cardiology

## 2021-03-14 ENCOUNTER — Telehealth: Payer: Self-pay

## 2021-03-19 NOTE — Telephone Encounter (Signed)
yes

## 2021-03-19 NOTE — Telephone Encounter (Signed)
If I remember correctly you told me there is nothing I need to do for this, correct?

## 2021-03-21 ENCOUNTER — Telehealth: Payer: Self-pay | Admitting: Student

## 2021-03-21 NOTE — Telephone Encounter (Signed)
Patient called back.  I attempted to discuss with him transitioning off Multaq to amiodarone.  However patient was frustrated and expressed that he would like "a heart doctor that sees him every week and comes out to his house".  I explained to patient that home health may be a good option for him and to discuss further with PCP.  However patient became increasingly frustrated and stated he "did not want to be seen by our office ever again" and he then hung up.  I was unable to make recommendations regarding antiarrhythmic therapy prior to patient hanging up the phone.  We will make patient's PCP aware as he seems to rely on her significantly.

## 2021-03-21 NOTE — Telephone Encounter (Signed)
Called patient and left a voicemail for him to call the office back to discuss further.  Patient's insurance denied coverage of Multaq, therefore would like to transition patient to amiodarone.

## 2021-06-05 ENCOUNTER — Telehealth: Payer: Self-pay

## 2021-06-05 ENCOUNTER — Ambulatory Visit: Payer: Medicaid Other | Admitting: Student

## 2021-06-05 ENCOUNTER — Other Ambulatory Visit: Payer: Self-pay

## 2021-06-05 ENCOUNTER — Encounter: Payer: Self-pay | Admitting: Student

## 2021-06-05 VITALS — BP 125/80 | HR 94 | Temp 97.0°F | Resp 17 | Ht 71.0 in | Wt 206.0 lb

## 2021-06-05 DIAGNOSIS — I480 Paroxysmal atrial fibrillation: Secondary | ICD-10-CM

## 2021-06-05 DIAGNOSIS — I10 Essential (primary) hypertension: Secondary | ICD-10-CM

## 2021-06-05 DIAGNOSIS — I25118 Atherosclerotic heart disease of native coronary artery with other forms of angina pectoris: Secondary | ICD-10-CM

## 2021-06-05 DIAGNOSIS — I5032 Chronic diastolic (congestive) heart failure: Secondary | ICD-10-CM

## 2021-06-05 MED ORDER — DILTIAZEM HCL ER COATED BEADS 120 MG PO CP24: 120.0000 mg | ORAL_CAPSULE | Freq: Every day | ORAL | 3 refills | Status: DC

## 2021-06-05 NOTE — Progress Notes (Signed)
Patient referred by [name], NP for chest pain  Subjective:   Kevin Orozco, male    DOB: 1955/08/08, 66 y.o.   MRN: [id number]   Chief Complaint  Patient presents with   Follow-up   Atrial Fibrillation   Hypertension   Coronary Artery Disease   CHETS PAIN     HPI  66 y.o. male with hypertension, paroxysmal Afib, moderate nonobstructive coronary artery disease (cath 2012), COPD, active smoker, prior h/o polysubstance abuse (EtOH, cocaine).  Patient has been unable to tolerate HCTZ in the past due to dizziness and falls.   Patient was diagnosed with HFrEF and A-fib with RVR in 01/2020, subsequently cardioverted in 02/2020.  He was then lost to follow-up until 01/2021 at which time patient admitted to medication noncompliance, therefore he was restarted on rate control medications given recurrence of A-fib with RVR.  He remained in A-fib with RVR and symptomatic, therefore underwent direct-current cardioversion 02/23/2021.   Patient now presents for urgent visit at his request with complaints of dyspnea, chest pain, and dizziness over the last 2 to 3 days.  Denies syncope or near syncope.  Notably patient was previously advised to start Multaq, however this was not approved by insurance, and patient was advised to transition to amiodarone, however during the conversation regarding antiarrhythmic therapy patient stated "I never want to see you again" and therefore amiodarone was not started.   Our office did call the pharmacy today who reports patient has been getting his medications, including metoprolol and Xarelto, in pill packs. Patient reports he has been taking all medications in pill packs as directed.    Unfortunately patient does continue to smoke approximately 1 pack/day.   Current Outpatient Medications on File Prior to Visit  Medication Sig Dispense Refill   clonazePAM (KLONOPIN) 0.5 MG tablet Take 0.5-1 mg by mouth See admin instructions. Take 1 mg by mouth at  bedtime and an additional 0.5-1 mg in the morning as needed for anxiety     diclofenac sodium (VOLTAREN) 1 % GEL Apply 2 g topically 4 (four) times daily as needed (to painful sites).     finasteride (PROSCAR) 5 MG tablet Take 5 mg by mouth at bedtime.     furosemide (LASIX) 40 MG tablet Take 1 tablet (40 mg total) by mouth daily as needed. For fluid weight gain- (Patient taking differently: Take 40 mg by mouth in the morning.) 30 tablet 0   gabapentin (NEURONTIN) 300 MG capsule Take 300 mg by mouth at bedtime.     ipratropium-albuterol (DUONEB) 0.5-2.5 (3) MG/3ML SOLN Take 3 mLs by nebulization 4 (four) times daily as needed (for shortness of breath or wheezing).     isosorbide mononitrate (IMDUR) 30 MG 24 hr tablet Take 1 tablet (30 mg total) by mouth daily. 30 tablet 0   lubiprostone (AMITIZA) 8 MCG capsule Take 8 mcg by mouth daily with breakfast.     metoprolol (TOPROL-XL) 200 MG 24 hr tablet Take 1 tablet (200 mg total) by mouth in the morning. 7 tablet 0   morphine (MS CONTIN) 30 MG 12 hr tablet Take 30 mg by mouth See admin instructions. Take 30 mg by mouth at 8 AM and 8 PM     MOVANTIK 12.5 MG TABS tablet Take 12.5 mg by mouth every morning.     Naloxone HCl (KLOXXADO) 8 MG/0.1ML LIQD Place into the nose as needed (for accidental overdose).     nitroGLYCERIN (NITROSTAT) 0.4 MG SL tablet Place under the tongue  every 5 (five) minutes as needed for chest pain.     oxyCODONE-acetaminophen (PERCOCET) 10-325 MG tablet Take 1 tablet by mouth every 4 (four) hours as needed.     pantoprazole (PROTONIX) 40 MG tablet Take 40 mg by mouth daily before breakfast.     predniSONE (DELTASONE) 10 MG tablet Take 10 mg by mouth daily with breakfast.     ranolazine (RANEXA) 500 MG 12 hr tablet TAKE ONE TABLET BY MOUTH TWICE DAILY (Patient taking differently: Take 500 mg by mouth in the morning and at bedtime.) 60 tablet 6   rosuvastatin (CRESTOR) 10 MG tablet Take 1 tablet (10 mg total) by mouth daily. 90  tablet 1   sertraline (ZOLOFT) 50 MG tablet Take 50 mg by mouth daily.     spironolactone (ALDACTONE) 25 MG tablet Take 1 tablet (25 mg total) by mouth daily. 30 tablet 0   tamsulosin (FLOMAX) 0.4 MG CAPS capsule Take 0.4 mg by mouth at bedtime.  0   traZODone (DESYREL) 100 MG tablet Take 50-100 mg by mouth at bedtime as needed for sleep.     TRELEGY ELLIPTA 200-62.5-25 MCG/INH AEPB Inhale 1 puff into the lungs daily.     XARELTO 20 MG TABS tablet Take 1 tablet (20 mg total) by mouth daily with supper. 90 tablet 1   No current facility-administered medications on file prior to visit.    Cardiovascular and other pertinent studies: EKG 06/05/2021:  Atrial fibrillation with rapid ventricular response at a rate of 144 bpm.  EKG 03/05/2021:  Sinus rhythm at a rate of 56 bpm.  Normal axis.  Left atrial enlargement.  Poor R wave progression, cannot exclude anteroseptal infarct old. Normal QT.   Direct-current cardioversion in the ED 02/23/2021 Successful return to normal sinus rhythm from atrial fibrillation with RVR.  EKG 02/23/2021: Atrial fibrillation with rapid ventricular response at a rate of 121 bpm.  PCV ECHOCARDIOGRAM COMPLETE 02/13/2021 Left ventricle cavity is normal in size. Moderate concentric hypertrophy of the left ventricle. Normal global wall motion. Normal LV systolic function with visual EF 50-55%. Diastolic function not assessed due to atrial fibrillation. Left atrial cavity is mildly dilated. Trileaflet aortic valve. Moderate aortic valve leaflet calcification. Mild (Grade I) aortic regurgitation. Mild mitral valve leaflet calcification. Mildly restricted mitral valve leaflets without significant stenosis. Moderate (Grade III) mitral regurgitation. Mild to moderate tricuspid regurgitation. Estimated pulmonary artery systolic pressure 29 mmHg. Mild to moderate pulmonic regurgitation. No significant change compared to previous study in 2021.  EKG 02/06/2021:  Atrial flutter  at a rate of 111 bpm  EKG 03/08/2020: Sinus rhyth 71 bpm Left atrial enlargement Occasional PVC  EKG 12/24/2019: Atrial fibrillation 91 bpm  EKG 07/12/2019: Atrial fibrillation, controlled ventricular rate 90 bpm. Nonspecific T-abnormality.   Coronary angiography 2012: LM: Normal LAD: Ostial 40-50% stenosis, mid LAD 30% stenosis LCx: OM2 30% stenosis RCA: Mid 20% stenosis and posterior lateral branch   Recent labs: CMP Latest Ref Rng & Units 02/23/2021 02/22/2020 02/21/2020  Glucose 70 - 99 mg/dL 119(H) 103(H) 89  BUN 8 - 23 mg/dL 14 17 10   Creatinine 0.61 - 1.24 mg/dL 1.19 1.06 0.88  Sodium 135 - 145 mmol/L 136 135 137  Potassium 3.5 - 5.1 mmol/L 4.3 4.4 3.8  Chloride 98 - 111 mmol/L 103 100 102  CO2 22 - 32 mmol/L 26 26 24   Calcium 8.9 - 10.3 mg/dL 9.3 8.9 9.0  Total Protein 6.5 - 8.1 g/dL 7.4 - -  Total Bilirubin 0.3 - 1.2 mg/dL  1.5(H) - -  Alkaline Phos 38 - 126 U/L 47 - -  AST 15 - 41 U/L 27 - -  ALT 0 - 44 U/L 24 - -   CBC Latest Ref Rng & Units 02/23/2021 02/18/2020 02/26/2015  WBC 4.0 - 10.5 K/uL 8.8 5.3 6.9  Hemoglobin 13.0 - 17.0 g/dL 13.2 11.6(L) 11.3(L)  Hematocrit 39.0 - 52.0 % 39.8 35.8(L) 33.8(L)  Platelets 150 - 400 K/uL 147(L) 150 122(L)   Lipid Panel     Component Value Date/Time   CHOL 125 04/20/2012 0500   TRIG 100 04/20/2012 0500   HDL 36 (L) 04/20/2012 0500   CHOLHDL 3.5 04/20/2012 0500   VLDL 20 04/20/2012 0500   LDLCALC 69 04/20/2012 0500   HEMOGLOBIN A1C Lab Results  Component Value Date   HGBA1C 5.4 09/09/2019   MPG 97 02/22/2015   TSH No results for input(s): TSH in the last 8760 hours.  02/01/2021: BUN 14, creatinine 0.87, GFR >60, sodium 142, potassium 3.8, AST 32, ALT 27 Total cholesterol 101, triglycerides 41, HDL 47, LDL 43 BNP pending  09/09/2019: HbA1C 5.4%  12/01/2018: H/H 13/38.2. MCV 97. Platelets 157   Review of Systems  Constitutional: Negative for malaise/fatigue and weight gain.  Cardiovascular:  Positive for  chest pain and dyspnea on exertion. Negative for claudication, leg swelling, near-syncope, orthopnea, palpitations, paroxysmal nocturnal dyspnea and syncope.  Respiratory:  Positive for shortness of breath.   Neurological:  Positive for dizziness.        Vitals:   06/05/21 0850 06/05/21 0905  BP: 99/67 125/80  Pulse: (!) 103 94  Resp: 17   Temp: (!) 97 F (36.1 C)   SpO2: 97% 97%     Body mass index is 28.73 kg/m. Filed Weights   06/05/21 0850  Weight: 206 lb (93.4 kg)     Objective:   Physical Exam Vitals and nursing note reviewed.  Constitutional:      Appearance: He is well-developed.  Neck:     Vascular: No JVD.  Cardiovascular:     Rate and Rhythm: Tachycardia present. Rhythm irregular.     Pulses: Intact distal pulses.     Heart sounds: S1 normal and S2 normal. No murmur heard.   No gallop.  Pulmonary:     Effort: No respiratory distress.     Breath sounds: Normal breath sounds. No wheezing, rhonchi or rales.  Musculoskeletal:     Right lower leg: No edema.     Left lower leg: No edema.  Neurological:     General: No focal deficit present.     Mental Status: He is alert.     Cranial Nerves: No cranial nerve deficit.       Assessment & Recommendations:   66 y.o. male with hypertension, paroxysmal Afib, moderate nonobstructive coronary artery disease (cath 2012), COPD, active smoker, prior h/o polysubstance abuse (EtOH, cocaine).   Paroxysmal atrial fibrillation/flutter: Patient presented today in atrial fibrillation with RVR with symptoms including dyspnea, chest pain, and dizziness.  Therefore discussed with patient recommendation to have him go to the emergency department for further evaluation and likely cardioversion as he reports compliance with medication pill packs from the pharmacy and we called pharmacy to confirm that he has been indeed taking Xarelto and metoprolol.  Patient was resistant to treatment in the emergency department despite  discussions of risks versus benefits.  Patient recognizes the risk of delaying further evaluation and treatment, however he opts for outpatient management.  Therefore counseled patient regarding signs  and symptoms that would warrant urgent or emergent evaluation, he verbalized understanding agreement. We will continue Toprol-XL 200 mg p.o. once daily and add diltiazem ER 120 mg p.o. daily to improve rate control.  Echo in 01/2021 revealed normal LVEF. As patient has been on anticoagulation >4 weeks we will plan for outpatient cardioversion.  CHA2DS2VASc score 2, annual stroke risk 2%. Patient continues to tolerate Xarelto without bleeding diathesis.  We will continue this.  Again reiterated to patient the importance of medication compliance, he verbalized understanding agreement.  CAD with stable angina: Continue beta-blocker and statin therapy. Continue Ranexa and Imdur. Patient is not on aspirin given Xarelto for paroxysmal atrial fibrillation Have previously discussed with patient recommendation for further ischemic evaluation, however despite recognition of the benefits of this evaluation patient wishes to hold off in the past.  We will reconsider this in the future.  HFpEF: 01/2021 echocardiogram noted preserved LVEF at 50-55%.  No significant change in echocardiogram since 2021. There is no clinical evidence of acute heart failure at today's visit. Continue metoprolol, spironolactone, Imdur, as well as Lasix. Would recommend considering addition of ARB or Entresto, however patient has previously opted to hold off on additional medications, we will plan to reevaluate at next visit.  Hypertension: Well-controlled. Continue present medications including spironolactone, metoprolol, Imdur.   Follow up after cardioversion.   Patient was seen in collaboration with Dr. Virgina Jock who is in agreement with the plan.    Alethia Berthold, PA-C 06/05/2021, 10:10 AM Office: [phone]

## 2021-06-06 ENCOUNTER — Other Ambulatory Visit: Payer: Self-pay | Admitting: Student

## 2021-06-06 DIAGNOSIS — I480 Paroxysmal atrial fibrillation: Secondary | ICD-10-CM

## 2021-06-13 ENCOUNTER — Ambulatory Visit: Payer: Medicaid Other | Admitting: Student

## 2021-06-13 ENCOUNTER — Other Ambulatory Visit: Payer: Self-pay

## 2021-06-13 VITALS — BP 114/85 | HR 109 | Resp 14

## 2021-06-13 DIAGNOSIS — I480 Paroxysmal atrial fibrillation: Secondary | ICD-10-CM

## 2021-06-13 NOTE — Progress Notes (Signed)
EKG 06/13/2021: Atrial fibrillation with rapid ventricular response at a rate of 129 bpm.  Patient remains in atrial fibrillation.  He is currently scheduled for cardioversion in 2 weeks.  However given continued A-fib with RVR recommend initiation of Multaq which she has previously tolerated well and responded well to.  Patient is not a good candidate for amiodarone as he is only 66 years old.  Patient is not a candidate for disopyramide or quinidine as these agents should be avoided given evidence suggestive of slightly increased mortality.  Given patient's history of CAD he is not a candidate for flecainide or propafenone.   Patient refuses hospitalization for initiation of dofetilide.  Therefore feel best option is to restart Multaq.  Paroxysmal atrial fibrillation [facility]) - Plan: EKG 12-Lead   Rayford Halsted, PA-C 06/13/2021, 4:15 PM Office: [phone]

## 2021-06-19 ENCOUNTER — Inpatient Hospital Stay (HOSPITAL_COMMUNITY)
Admission: EM | Admit: 2021-06-19 | Discharge: 2021-06-26 | DRG: 175 | Disposition: A | Discharge status: Home / Self Care | Payer: Medicaid Other | Attending: Internal Medicine | Admitting: Internal Medicine

## 2021-06-19 ENCOUNTER — Other Ambulatory Visit: Payer: Self-pay

## 2021-06-19 ENCOUNTER — Emergency Department (HOSPITAL_COMMUNITY): Payer: Medicaid Other

## 2021-06-19 ENCOUNTER — Encounter (HOSPITAL_COMMUNITY): Payer: Self-pay | Admitting: Cardiology

## 2021-06-19 DIAGNOSIS — J9621 Acute and chronic respiratory failure with hypoxia: Secondary | ICD-10-CM | POA: Diagnosis present

## 2021-06-19 DIAGNOSIS — I5033 Acute on chronic diastolic (congestive) heart failure: Secondary | ICD-10-CM | POA: Diagnosis present

## 2021-06-19 DIAGNOSIS — I130 Hypertensive heart and chronic kidney disease with heart failure and stage 1 through stage 4 chronic kidney disease, or unspecified chronic kidney disease: Secondary | ICD-10-CM | POA: Diagnosis present

## 2021-06-19 DIAGNOSIS — Z7901 Long term (current) use of anticoagulants: Secondary | ICD-10-CM

## 2021-06-19 DIAGNOSIS — I25118 Atherosclerotic heart disease of native coronary artery with other forms of angina pectoris: Secondary | ICD-10-CM | POA: Diagnosis present

## 2021-06-19 DIAGNOSIS — F419 Anxiety disorder, unspecified: Secondary | ICD-10-CM | POA: Diagnosis not present

## 2021-06-19 DIAGNOSIS — N182 Chronic kidney disease, stage 2 (mild): Secondary | ICD-10-CM | POA: Diagnosis present

## 2021-06-19 DIAGNOSIS — I248 Other forms of acute ischemic heart disease: Secondary | ICD-10-CM | POA: Diagnosis present

## 2021-06-19 DIAGNOSIS — I2510 Atherosclerotic heart disease of native coronary artery without angina pectoris: Secondary | ICD-10-CM | POA: Diagnosis present

## 2021-06-19 DIAGNOSIS — F1120 Opioid dependence, uncomplicated: Secondary | ICD-10-CM | POA: Diagnosis present

## 2021-06-19 DIAGNOSIS — Z7952 Long term (current) use of systemic steroids: Secondary | ICD-10-CM

## 2021-06-19 DIAGNOSIS — I4892 Unspecified atrial flutter: Secondary | ICD-10-CM | POA: Diagnosis present

## 2021-06-19 DIAGNOSIS — I2694 Multiple subsegmental pulmonary emboli without acute cor pulmonale: Principal | ICD-10-CM | POA: Diagnosis present

## 2021-06-19 DIAGNOSIS — Z888 Allergy status to other drugs, medicaments and biological substances status: Secondary | ICD-10-CM

## 2021-06-19 DIAGNOSIS — Z20822 Contact with and (suspected) exposure to covid-19: Secondary | ICD-10-CM | POA: Diagnosis present

## 2021-06-19 DIAGNOSIS — J9601 Acute respiratory failure with hypoxia: Secondary | ICD-10-CM

## 2021-06-19 DIAGNOSIS — Z833 Family history of diabetes mellitus: Secondary | ICD-10-CM

## 2021-06-19 DIAGNOSIS — F1011 Alcohol abuse, in remission: Secondary | ICD-10-CM | POA: Diagnosis present

## 2021-06-19 DIAGNOSIS — E1151 Type 2 diabetes mellitus with diabetic peripheral angiopathy without gangrene: Secondary | ICD-10-CM | POA: Diagnosis present

## 2021-06-19 DIAGNOSIS — E1122 Type 2 diabetes mellitus with diabetic chronic kidney disease: Secondary | ICD-10-CM | POA: Diagnosis present

## 2021-06-19 DIAGNOSIS — Z7951 Long term (current) use of inhaled steroids: Secondary | ICD-10-CM

## 2021-06-19 DIAGNOSIS — J9811 Atelectasis: Secondary | ICD-10-CM | POA: Diagnosis present

## 2021-06-19 DIAGNOSIS — I5032 Chronic diastolic (congestive) heart failure: Secondary | ICD-10-CM

## 2021-06-19 DIAGNOSIS — D72829 Elevated white blood cell count, unspecified: Secondary | ICD-10-CM | POA: Diagnosis present

## 2021-06-19 DIAGNOSIS — J449 Chronic obstructive pulmonary disease, unspecified: Secondary | ICD-10-CM | POA: Diagnosis present

## 2021-06-19 DIAGNOSIS — G8929 Other chronic pain: Secondary | ICD-10-CM | POA: Diagnosis present

## 2021-06-19 DIAGNOSIS — E785 Hyperlipidemia, unspecified: Secondary | ICD-10-CM | POA: Diagnosis present

## 2021-06-19 DIAGNOSIS — G4700 Insomnia, unspecified: Secondary | ICD-10-CM | POA: Diagnosis present

## 2021-06-19 DIAGNOSIS — I2699 Other pulmonary embolism without acute cor pulmonale: Secondary | ICD-10-CM | POA: Diagnosis not present

## 2021-06-19 DIAGNOSIS — F17210 Nicotine dependence, cigarettes, uncomplicated: Secondary | ICD-10-CM | POA: Diagnosis present

## 2021-06-19 DIAGNOSIS — Z8249 Family history of ischemic heart disease and other diseases of the circulatory system: Secondary | ICD-10-CM

## 2021-06-19 DIAGNOSIS — J441 Chronic obstructive pulmonary disease with (acute) exacerbation: Secondary | ICD-10-CM | POA: Diagnosis present

## 2021-06-19 DIAGNOSIS — E7800 Pure hypercholesterolemia, unspecified: Secondary | ICD-10-CM | POA: Diagnosis present

## 2021-06-19 DIAGNOSIS — I10 Essential (primary) hypertension: Secondary | ICD-10-CM

## 2021-06-19 DIAGNOSIS — E876 Hypokalemia: Secondary | ICD-10-CM | POA: Diagnosis not present

## 2021-06-19 DIAGNOSIS — E1121 Type 2 diabetes mellitus with diabetic nephropathy: Secondary | ICD-10-CM | POA: Diagnosis present

## 2021-06-19 DIAGNOSIS — I480 Paroxysmal atrial fibrillation: Secondary | ICD-10-CM | POA: Diagnosis present

## 2021-06-19 LAB — CBC
HCT: 37.2 % — ABNORMAL LOW (ref 39.0–52.0)
Hemoglobin: 12.2 g/dL — ABNORMAL LOW (ref 13.0–17.0)
MCH: 35.4 pg — ABNORMAL HIGH (ref 26.0–34.0)
MCHC: 32.8 g/dL (ref 30.0–36.0)
MCV: 107.8 fL — ABNORMAL HIGH (ref 80.0–100.0)
Platelets: 146 10*3/uL — ABNORMAL LOW (ref 150–400)
RBC: 3.45 MIL/uL — ABNORMAL LOW (ref 4.22–5.81)
RDW: 14.6 % (ref 11.5–15.5)
WBC: 9.7 10*3/uL (ref 4.0–10.5)
nRBC: 0 % (ref 0.0–0.2)

## 2021-06-19 LAB — BASIC METABOLIC PANEL
Anion gap: 9 (ref 5–15)
BUN: 19 mg/dL (ref 8–23)
CO2: 23 mmol/L (ref 22–32)
Calcium: 8.6 mg/dL — ABNORMAL LOW (ref 8.9–10.3)
Chloride: 103 mmol/L (ref 98–111)
Creatinine, Ser: 0.92 mg/dL (ref 0.61–1.24)
GFR, Estimated: >60 mL/min (ref >60)
Glucose, Bld: 137 mg/dL — ABNORMAL HIGH (ref 70–99)
Potassium: 3.9 mmol/L (ref 3.5–5.1)
Sodium: 135 mmol/L (ref 135–145)

## 2021-06-19 LAB — MAGNESIUM: Magnesium: 2.1 mg/dL (ref 1.7–2.4)

## 2021-06-19 LAB — TROPONIN I (HIGH SENSITIVITY)
Troponin I (High Sensitivity): 24 ng/L — ABNORMAL HIGH (ref <18)
Troponin I (High Sensitivity): 30 ng/L — ABNORMAL HIGH (ref <18)

## 2021-06-19 MED ORDER — DILTIAZEM HCL ER COATED BEADS 120 MG PO CP24
120.0000 mg | ORAL_CAPSULE | Freq: Every day | ORAL | Status: DC
  Administered 2021-06-20 – 2021-06-26 (×7): 120 mg via ORAL
  Filled 2021-06-19 (×7): qty 1

## 2021-06-19 MED ORDER — IOHEXOL 350 MG/ML SOLN
75.0000 mL | Freq: Once | INTRAVENOUS | Status: AC | PRN
  Administered 2021-06-19: 75 mL via INTRAVENOUS

## 2021-06-19 MED ORDER — HEPARIN (PORCINE) 25000 UT/250ML-% IV SOLN
1900.0000 [IU]/h | INTRAVENOUS | Status: DC
  Administered 2021-06-19: 1500 [IU]/h via INTRAVENOUS
  Administered 2021-06-20: 1700 [IU]/h via INTRAVENOUS
  Filled 2021-06-19 (×2): qty 250

## 2021-06-19 MED ORDER — SODIUM CHLORIDE 0.9 % IV BOLUS
500.0000 mL | Freq: Once | INTRAVENOUS | Status: AC
  Administered 2021-06-19: 500 mL via INTRAVENOUS

## 2021-06-19 MED ORDER — ETOMIDATE 2 MG/ML IV SOLN
10.0000 mg | Freq: Once | INTRAVENOUS | Status: AC
  Administered 2021-06-19: 10 mg via INTRAVENOUS
  Filled 2021-06-19: qty 10

## 2021-06-19 MED ORDER — FLUTICASONE FUROATE-VILANTEROL 200-25 MCG/ACT IN AEPB
1.0000 | INHALATION_SPRAY | Freq: Every day | RESPIRATORY_TRACT | Status: DC
Start: 2021-06-20 — End: 2021-06-26
  Administered 2021-06-20 – 2021-06-26 (×7): 1 via RESPIRATORY_TRACT
  Filled 2021-06-19: qty 28

## 2021-06-19 MED ORDER — ISOSORBIDE MONONITRATE ER 30 MG PO TB24
30.0000 mg | ORAL_TABLET | Freq: Every day | ORAL | Status: DC
Start: 2021-06-20 — End: 2021-06-26
  Administered 2021-06-20 – 2021-06-26 (×7): 30 mg via ORAL
  Filled 2021-06-19 (×7): qty 1

## 2021-06-19 MED ORDER — FENTANYL CITRATE PF 50 MCG/ML IJ SOSY
50.0000 ug | PREFILLED_SYRINGE | Freq: Once | INTRAMUSCULAR | Status: AC
  Administered 2021-06-19: 50 ug via INTRAVENOUS
  Filled 2021-06-19: qty 1

## 2021-06-19 MED ORDER — OXYCODONE-ACETAMINOPHEN 5-325 MG PO TABS
1.0000 | ORAL_TABLET | ORAL | Status: DC | PRN
  Administered 2021-06-20 (×3): 1 via ORAL
  Filled 2021-06-19 (×3): qty 1

## 2021-06-19 MED ORDER — SODIUM CHLORIDE 0.9% FLUSH
3.0000 mL | Freq: Two times a day (BID) | INTRAVENOUS | Status: DC
  Administered 2021-06-19 – 2021-06-26 (×13): 3 mL via INTRAVENOUS

## 2021-06-19 MED ORDER — ACETAMINOPHEN 650 MG RE SUPP: 650.0000 mg | Freq: Four times a day (QID) | RECTAL | Status: DC | PRN

## 2021-06-19 MED ORDER — NALOXONE HCL 0.4 MG/ML IJ SOLN: 0.4000 mg | INTRAMUSCULAR | Status: DC | PRN

## 2021-06-19 MED ORDER — METOPROLOL SUCCINATE ER 25 MG PO TB24
200.0000 mg | ORAL_TABLET | Freq: Every morning | ORAL | Status: DC
Start: 2021-06-20 — End: 2021-06-20
  Administered 2021-06-20: 200 mg via ORAL
  Filled 2021-06-19: qty 8

## 2021-06-19 MED ORDER — MORPHINE SULFATE ER 15 MG PO TBCR
30.0000 mg | EXTENDED_RELEASE_TABLET | Freq: Two times a day (BID) | ORAL | Status: DC
  Administered 2021-06-20 – 2021-06-26 (×13): 30 mg via ORAL
  Filled 2021-06-19 (×12): qty 2
  Filled 2021-06-19: qty 1

## 2021-06-19 MED ORDER — SPIRONOLACTONE 25 MG PO TABS
25.0000 mg | ORAL_TABLET | Freq: Every day | ORAL | Status: DC
  Administered 2021-06-20 – 2021-06-26 (×7): 25 mg via ORAL
  Filled 2021-06-19 (×7): qty 1

## 2021-06-19 MED ORDER — PANTOPRAZOLE SODIUM 40 MG PO TBEC
40.0000 mg | DELAYED_RELEASE_TABLET | Freq: Every day | ORAL | Status: DC
Start: 2021-06-20 — End: 2021-06-26
  Administered 2021-06-20 – 2021-06-26 (×7): 40 mg via ORAL
  Filled 2021-06-19 (×7): qty 1

## 2021-06-19 MED ORDER — RANOLAZINE ER 500 MG PO TB12
500.0000 mg | ORAL_TABLET | Freq: Two times a day (BID) | ORAL | Status: DC
Start: 2021-06-19 — End: 2021-06-26
  Administered 2021-06-20 – 2021-06-26 (×14): 500 mg via ORAL
  Filled 2021-06-19 (×15): qty 1

## 2021-06-19 MED ORDER — UMECLIDINIUM BROMIDE 62.5 MCG/ACT IN AEPB
1.0000 | INHALATION_SPRAY | Freq: Every day | RESPIRATORY_TRACT | Status: DC
  Administered 2021-06-20 – 2021-06-26 (×7): 1 via RESPIRATORY_TRACT
  Filled 2021-06-19: qty 7

## 2021-06-19 MED ORDER — HEPARIN BOLUS VIA INFUSION
4000.0000 [IU] | Freq: Once | INTRAVENOUS | Status: AC
  Administered 2021-06-19: 4000 [IU] via INTRAVENOUS
  Filled 2021-06-19: qty 4000

## 2021-06-19 MED ORDER — OXYCODONE HCL 5 MG PO TABS
5.0000 mg | ORAL_TABLET | ORAL | Status: DC | PRN
  Administered 2021-06-20 – 2021-06-26 (×22): 5 mg via ORAL
  Filled 2021-06-19 (×22): qty 1

## 2021-06-19 MED ORDER — ACETAMINOPHEN 325 MG PO TABS
650.0000 mg | ORAL_TABLET | Freq: Four times a day (QID) | ORAL | Status: DC | PRN
  Administered 2021-06-21 – 2021-06-25 (×6): 650 mg via ORAL
  Filled 2021-06-19 (×6): qty 2

## 2021-06-19 MED ORDER — ROSUVASTATIN CALCIUM 5 MG PO TABS
10.0000 mg | ORAL_TABLET | Freq: Every day | ORAL | Status: DC
  Administered 2021-06-20 – 2021-06-26 (×7): 10 mg via ORAL
  Filled 2021-06-19 (×7): qty 2

## 2021-06-19 MED ORDER — OXYCODONE-ACETAMINOPHEN 10-325 MG PO TABS: 1.0000 | ORAL_TABLET | ORAL | Status: DC | PRN

## 2021-06-19 NOTE — Progress Notes (Signed)
ANTICOAGULATION CONSULT NOTE - Initial Consult  Pharmacy Consult for heparin Indication: pulmonary embolus  Allergies  Allergen Reactions   Etomidate Anxiety   Tape Other (See Comments)    SKIN WILL TEAR EASILY!!!!    Patient Measurements:   Heparin Dosing Weight: TBW  Vital Signs: Temp: 97.7 F (36.5 C) (02/28 1442) Temp Source: Oral (02/28 2205) BP: 99/70 (02/28 2215) Pulse Rate: 74 (02/28 2215)  Labs: Recent Labs    06/19/21 1519 06/19/21 1711  HGB 12.2*  --   HCT 37.2*  --   PLT 146*  --   CREATININE 0.92  --   TROPONINIHS 24* 30*    CrCl cannot be calculated (Unknown ideal weight.).   Medical History: Past Medical History:  Diagnosis Date   Acute respiratory distress 02/22/2015   Atrial fibrillation with RVR (HCC) 04/19/12   New onset; spontaneous conversion to NSR; Pradaxa anticoagulation   CAD (coronary artery disease)    a. Cath 2008- nonobstructive, mod LAD, mild LCx & RCA disease b. Cardiolite 2010 normal c. 03/2012 abnormal Lexiscan Myoview attributed to cocaine d. 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   Elevated LFTs in the setting of acute EtOH abuse, no acute findings on abdominal u/s, HCV+, suspected acute alcohilic heptatitis   HTN (hypertension)    Hypercholesteremia    PAF (paroxysmal atrial fibrillation) (HCC) 02/22/2015   Tobacco abuse     Assessment: 18 YOM presenting with SOB, hx of afib on Xarelto PTA (last dose 2/27 ~1700), CT angio showing bilateral segmental/subsegmental PE without RHS however distended pulmonary trunk and contrast reflux VC/hepatic veins - given this and last dose >24h ago will add bolus  Goal of Therapy:  Heparin level 0.3-0.7 units/ml aPTT 66-102 seconds Monitor platelets by anticoagulation protocol: Yes   Plan:  Heparin 4000 units IV x 1, and gtt at 1500 units/hr F/u  6 hour aPTT/HL  Daylene Posey, PharmD Clinical Pharmacist ED Pharmacist Phone # [phone] 06/19/2021 10:56 PM

## 2021-06-19 NOTE — ED Notes (Signed)
Pt A&O4, GCS 15. Pt awaiting cardioversion, AFIB noted on cardiac monitor. Pt reporting chronic back pain and fatigue at this time.

## 2021-06-19 NOTE — ED Notes (Addendum)
Pt updated on delay of cardioversion by MD Long due to high patient acuity in the ED at this time. Pt remains stable at this time.

## 2021-06-19 NOTE — ED Triage Notes (Signed)
EMS stated, he is in A-Fib and the pt stated I have to be cardio verted before he goes out. He cant afford the medication that keeps him regular.

## 2021-06-19 NOTE — ED Provider Notes (Signed)
Emergency Department Provider Note   I have reviewed the triage vital signs and the nursing notes.   HISTORY  Chief Complaint Atrial Fibrillation and Back Pain   HPI Kevin Orozco is a 66 y.o. male with past medical history reviewed below including COPD, diabetes, paroxysmal atrial fibrillation on Xarelto who presents with shortness of breath symptoms.  Patient denies any chest pain/pressure.  He tells me that his shortness of breath feels different than his COPD and is requesting cardioversion.  He states that he has been following with his cardiology team at [facility] and has a cardioversion scheduled for March 8th.  He has been taking sample packs of Multaq but only has 3 days left because his insurance will not pay for this prescription.  Patient tells me he has been cardioverted multiple times.  He denies any fevers, chills, productive cough.  No abdominal or back pain. Patient placed on [place] O2 in triage for comfort but no hypoxemia.    Past Medical History:  Diagnosis Date   Acute respiratory distress 02/22/2015   Atrial fibrillation with RVR ([place]) 04/19/12   New onset; spontaneous conversion to NSR; Pradaxa anticoagulation   CAD (coronary artery disease)    a. Cath 2008- nonobstructive, mod LAD, mild LCx & RCA disease b. Cardiolite 2010 normal c. 03/2012 abnormal Lexiscan Myoview attributed to cocaine d. 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 ENT: No sore throat. Cardiovascular: Denies chest pain. Respiratory:  Positive shortness of breath. Gastrointestinal: No abdominal pain.  No nausea, no vomiting.  No diarrhea.  No constipation. Genitourinary: Negative for dysuria. Musculoskeletal: Negative for back pain. Skin: Negative for rash. Neurological: Negative for headaches, focal weakness or numbness.   ____________________________________________   PHYSICAL EXAM:  VITAL SIGNS: ED Triage Vitals  Enc Vitals Group     BP 06/19/21 1442 95/64     Pulse Rate 06/19/21 1442 89     Resp 06/19/21 1442 20     Temp 06/19/21 1442 97.7 F (36.5 C)     Temp Source 06/19/21 1442 Oral     SpO2 06/19/21 1442 96 %   Constitutional: Alert and oriented. Well appearing and in no acute distress. Eyes: Conjunctivae are normal.  Head: Atraumatic. Nose: No congestion/rhinnorhea. Mouth/Throat: Mucous membranes are moist.  Neck: No stridor.   Cardiovascular: Rate-controlled atrial flutter. Good peripheral circulation. Grossly normal heart sounds.   Respiratory: Normal respiratory effort.  No retractions. Lungs CTAB. Gastrointestinal: Soft and nontender. No distention.  Musculoskeletal: No lower extremity tenderness nor edema. No gross deformities of extremities. Neurologic:  Normal speech and language. No gross focal neurologic deficits are appreciated.  Skin:  Skin is warm, dry and intact. No rash noted.  ____________________________________________   LABS (all labs ordered are listed, but only abnormal results are displayed)  Labs Reviewed  BASIC METABOLIC PANEL - Abnormal; Notable for the following components:      Result Value   Glucose, Bld 137 (*)    Calcium 8.6 (*)  All other components within normal limits  CBC - Abnormal; Notable for the following components:   RBC 3.45 (*)    Hemoglobin 12.2 (*)    HCT 37.2 (*)    MCV 107.8 (*)    MCH 35.4 (*)    Platelets 146 (*)    All other components within normal limits  BRAIN NATRIURETIC PEPTIDE - Abnormal; Notable for the following components:    B Natriuretic Peptide 708.0 (*)    All other components within normal limits  HEPARIN LEVEL (UNFRACTIONATED) - Abnormal; Notable for the following components:   Heparin Unfractionated >1.10 (*)    All other components within normal limits  APTT - Abnormal; Notable for the following components:   aPTT 56 (*)    All other components within normal limits  CBC - Abnormal; Notable for the following components:   WBC 14.3 (*)    RBC 3.86 (*)    MCV 107.5 (*)    MCH 35.8 (*)    All other components within normal limits  BASIC METABOLIC PANEL - Abnormal; Notable for the following components:   CO2 21 (*)    Calcium 8.5 (*)    All other components within normal limits  APTT - Abnormal; Notable for the following components:   aPTT 52 (*)    All other components within normal limits  RAPID URINE DRUG SCREEN, HOSP PERFORMED - Abnormal; Notable for the following components:   Opiates POSITIVE (*)    Benzodiazepines POSITIVE (*)    All other components within normal limits  CBC - Abnormal; Notable for the following components:   WBC 11.2 (*)    RBC 3.64 (*)    Hemoglobin 12.9 (*)    HCT 37.7 (*)    MCV 103.6 (*)    MCH 35.4 (*)    Platelets 146 (*)    All other components within normal limits  BASIC METABOLIC PANEL - Abnormal; Notable for the following components:   Calcium 8.6 (*)    All other components within normal limits  GLUCOSE, CAPILLARY - Abnormal; Notable for the following components:   Glucose-Capillary 104 (*)    All other components within normal limits  CBG MONITORING, ED - Abnormal; Notable for the following components:   Glucose-Capillary 107 (*)    All other components within normal limits  TROPONIN I (HIGH SENSITIVITY) - Abnormal; Notable for the following components:   Troponin I (High Sensitivity) 24 (*)    All other components within normal limits  TROPONIN I (HIGH SENSITIVITY) - Abnormal; Notable for the following components:   Troponin I (High Sensitivity)  30 (*)    All other components within normal limits  RESP PANEL BY RT-PCR (FLU A&B, COVID) ARPGX2  MAGNESIUM  HIV ANTIBODY (ROUTINE TESTING W REFLEX)  LIPID PANEL  CBG MONITORING, ED   ____________________________________________  EKG   EKG Interpretation  Date/Time:  Tuesday June 19 2021 14:41:28 EST Ventricular Rate:  85 PR Interval:    QRS Duration: 92 QT Interval:  442 QTC Calculation: 525 R Axis:   24 Text Interpretation: Atrial flutter with variable A-V block Left ventricular hypertrophy with repolarization abnormality ( R in aVL ) Possible Inferior infarct , age undetermined Prolonged QT Abnormal ECG When compared with ECG of 23-Feb-2021 19:51, PREVIOUS ECG IS PRESENT Confirmed by Nanda Quinton [phone]) on 06/19/2021 4:26:41 PM        ____________________________________________  RADIOLOGY  ECHOCARDIOGRAM COMPLETE  Result Date: 06/20/2021    ECHOCARDIOGRAM REPORT   Patient Name:  [name] Date of Exam: 06/20/2021 Medical Rec #:  [id number]      Height:       71.0 in Accession #:    [id number]     Weight:       205.0 lb Date of Birth:  [birth year]      BSA:          2.131 m Patient Age:    75 years       BP:           132/81 mmHg Patient Gender: M              HR:           67 bpm. Exam Location:  Inpatient Procedure: 2D Echo Indications:    Acute respiratory distress  History:        Patient has prior history of Echocardiogram examinations, most                 recent 02/13/2021. CAD; Risk Factors:Diabetes and Hypertension.  Sonographer:    Arlyss Gandy Referring Phys: [id number] CELESTE C CANTWELL  Sonographer Comments: Image acquisition challenging due to patient body habitus and supine. IMPRESSIONS  1. Left ventricular ejection fraction, by estimation, is 60 to 65%. The left ventricle has normal function. The left ventricle has no regional wall motion abnormalities. There is mild left ventricular hypertrophy. Left ventricular diastolic parameters are indeterminate.  2.  Right ventricular systolic function is moderately reduced. The right ventricular size is normal.  3. Left atrial size was severely dilated.  4. Right atrial size was moderately dilated.  5. The mitral valve is grossly normal. Mild mitral valve regurgitation.  6. The aortic valve is grossly normal. Aortic valve regurgitation is trivial. FINDINGS  Left Ventricle: Left ventricular ejection fraction, by estimation, is 60 to 65%. The left ventricle has normal function. The left ventricle has no regional wall motion abnormalities. The left ventricular internal cavity size was normal in size. There is  mild left ventricular hypertrophy. Left ventricular diastolic parameters are indeterminate. Right Ventricle: The right ventricular size is normal. Right vetricular wall thickness was not well visualized. Right ventricular systolic function is moderately reduced. Left Atrium: Left atrial size was severely dilated. Right Atrium: Right atrial size was moderately dilated. Pericardium: There is no evidence of pericardial effusion. Mitral Valve: The mitral valve is grossly normal. Mild mitral valve regurgitation. Tricuspid Valve: The tricuspid valve is grossly normal. Tricuspid valve regurgitation is not demonstrated. Aortic Valve: The aortic valve is grossly normal. Aortic valve regurgitation is trivial. Aortic regurgitation PHT measures 535 msec. Aortic valve mean gradient measures 3.0 mmHg. Aortic valve peak gradient measures 5.7 mmHg. Aortic valve area, by VTI measures 3.21 cm. Pulmonic Valve: The pulmonic valve was not well visualized. Pulmonic valve regurgitation is not visualized. Aorta: The aortic root and ascending aorta are structurally normal, with no evidence of dilitation. IAS/Shunts: The atrial septum is grossly normal.  LEFT VENTRICLE PLAX 2D LVIDd:         4.30 cm   Diastology LVIDs:         3.00 cm   LV e' medial:    7.72 cm/s LV PW:         1.30 cm   LV E/e' medial:  13.2 LV IVS:        1.30 cm   LV e' lateral:    8.81 cm/s LVOT diam:     2.00 cm   LV E/e' lateral: 11.6 LV SV:  84 LV SV Index:   39 LVOT Area:     3.14 cm  RIGHT VENTRICLE             IVC RV Basal diam:  3.70 cm     IVC diam: 2.70 cm RV Mid diam:    2.90 cm RV S prime:     10.10 cm/s TAPSE (M-mode): 1.2 cm LEFT ATRIUM              Index        RIGHT ATRIUM           Index LA diam:        4.50 cm  2.11 cm/m   RA Area:     25.20 cm LA Vol (A2C):   120.0 ml 56.32 ml/m  RA Volume:   81.00 ml  38.01 ml/m LA Vol (A4C):   90.8 ml  42.61 ml/m LA Biplane Vol: 106.0 ml 49.75 ml/m  AORTIC VALVE AV Area (Vmax):    3.25 cm AV Area (Vmean):   3.09 cm AV Area (VTI):     3.21 cm AV Vmax:           119.00 cm/s AV Vmean:          84.500 cm/s AV VTI:            0.260 m AV Peak Grad:      5.7 mmHg AV Mean Grad:      3.0 mmHg LVOT Vmax:         123.00 cm/s LVOT Vmean:        83.200 cm/s LVOT VTI:          0.266 m LVOT/AV VTI ratio: 1.02 AI PHT:            535 msec  AORTA Ao Root diam: 3.70 cm Ao Asc diam:  3.80 cm MITRAL VALVE MV Area (PHT): 3.53 cm     SHUNTS MV Decel Time: 215 msec     Systemic VTI:  0.27 m MV E velocity: 102.00 cm/s  Systemic Diam: 2.00 cm MV A velocity: 74.10 cm/s MV E/A ratio:  1.38 Mertie Moores MD Electronically signed by Mertie Moores MD Signature Date/Time: 06/20/2021/4:07:03 PM    Final     ____________________________________________   PROCEDURES  Procedure(s) performed:   .Cardioversion  Date/Time: 06/19/2021 9:24 PM Performed by: Margette Fast, MD Authorized by: Margette Fast, MD   Consent:    Consent obtained:  Written   Consent given by:  Patient   Risks discussed:  Cutaneous burn, death, induced arrhythmia and pain   Alternatives discussed:  Delayed treatment Universal protocol:    Immediately prior to procedure a time out was called: yes     Patient identity confirmed:  Arm band and verbally with patient Pre-procedure details:    Cardioversion basis:  Emergent   Rhythm:  Atrial flutter   Electrode  placement:  Anterior-posterior Patient sedated: Yes. Refer to sedation procedure documentation for details of sedation.  Attempt one:    Cardioversion mode:  Synchronous   Waveform:  Biphasic   Shock (Joules):  100   Shock outcome:  Conversion to normal sinus rhythm Post-procedure details:    Patient status:  Awake   Patient tolerance of procedure:  Tolerated well, no immediate complications Comments:     After the cardioversion, the patient had some difficulty when coming out from sedation.  He seemed confused and somewhat panicked at times.  He did respond to redirection and ultimately returned  to baseline although did take somewhat longer than expected.  Would avoid etomidate in the future.  I have listed this as an allergy in the chart.   .Critical Care Performed by: Maia Plan, MD Authorized by: Maia Plan, MD   Critical care provider statement:    Critical care time (minutes):  75   Critical care time was exclusive of:  Separately billable procedures and treating other patients and teaching time   Critical care was necessary to treat or prevent imminent or life-threatening deterioration of the following conditions:  Respiratory failure and circulatory failure   Critical care was time spent personally by me on the following activities:  Development of treatment plan with patient or surrogate, discussions with consultants, evaluation of patient's response to treatment, examination of patient, ordering and review of laboratory studies, ordering and review of radiographic studies, ordering and performing treatments and interventions, pulse oximetry, re-evaluation of patient's condition, review of old charts and blood draw for specimens   I assumed direction of critical care for this patient from another provider in my specialty: no     Care discussed with: admitting provider     ____________________________________________   INITIAL IMPRESSION / ASSESSMENT AND PLAN / ED  COURSE  Pertinent labs & imaging results that were available during my care of the patient were reviewed by me and considered in my medical decision making (see chart for details).   This patient is Presenting for Evaluation of SOB, which does require a range of treatment options, and is a complaint that involves a high risk of morbidity and mortality.  The Differential Diagnoses include COPD, PNA, PNX, PE, arrhythmia, CHF.  Critical Interventions- Cardioversion and coordination with cardiology.    Medications  morphine (MS CONTIN) 12 hr tablet 30 mg (30 mg Oral Given 06/21/21 0844)  diltiazem (CARDIZEM CD) 24 hr capsule 120 mg (120 mg Oral Given 06/21/21 1029)  isosorbide mononitrate (IMDUR) 24 hr tablet 30 mg (30 mg Oral Given 06/21/21 1029)  ranolazine (RANEXA) 12 hr tablet 500 mg (500 mg Oral Given 06/21/21 1029)  rosuvastatin (CRESTOR) tablet 10 mg (10 mg Oral Given 06/21/21 1030)  pantoprazole (PROTONIX) EC tablet 40 mg (40 mg Oral Given 06/21/21 0844)  naloxone (NARCAN) injection 0.4 mg (has no administration in time range)  fluticasone furoate-vilanterol (BREO ELLIPTA) 200-25 MCG/ACT 1 puff (1 puff Inhalation Given 06/21/21 0823)    And  umeclidinium bromide (INCRUSE ELLIPTA) 62.5 MCG/ACT 1 puff (1 puff Inhalation Given by Other 06/21/21 0825)  sodium chloride flush (NS) 0.9 % injection 3 mL (3 mLs Intravenous Given 06/21/21 1031)  acetaminophen (TYLENOL) tablet 650 mg (650 mg Oral Given 06/21/21 0545)    Or  acetaminophen (TYLENOL) suppository 650 mg ( Rectal See Alternative 06/21/21 0545)  spironolactone (ALDACTONE) tablet 25 mg (25 mg Oral Given 06/21/21 1029)  oxyCODONE (Oxy IR/ROXICODONE) immediate release tablet 5 mg (5 mg Oral Given 06/21/21 0545)  insulin aspart (novoLOG) injection 0-9 Units (0 Units Subcutaneous Patient Refused/Not Given 06/21/21 0850)  gabapentin (NEURONTIN) capsule 300 mg (300 mg Oral Given 06/20/21 2000)  predniSONE (DELTASONE) tablet 10 mg (10 mg Oral Given 06/21/21 0844)   traZODone (DESYREL) tablet 50-100 mg (50 mg Oral Given 06/20/21 0412)  clonazePAM (KLONOPIN) tablet 0.5-1 mg (1 mg Oral Given 06/20/21 1104)  furosemide (LASIX) injection 40 mg (40 mg Intravenous Given 06/21/21 0905)  enoxaparin (LOVENOX) injection 95 mg (has no administration in time range)  dronedarone (MULTAQ) tablet 400 mg (has no administration in time range)  warfarin (COUMADIN) tablet 7.5 mg (has no administration in time range)  Warfarin - Pharmacist Dosing Inpatient (has no administration in time range)  coumadin book (has no administration in time range)  etomidate (AMIDATE) injection 10 mg (10 mg Intravenous Given 06/19/21 2036)  sodium chloride 0.9 % bolus 500 mL (0 mLs Intravenous Stopped 06/19/21 2309)  iohexol (OMNIPAQUE) 350 MG/ML injection 75 mL (75 mLs Intravenous Contrast Given 06/19/21 2208)  fentaNYL (SUBLIMAZE) injection 50 mcg (50 mcg Intravenous Given 06/19/21 2323)  heparin bolus via infusion 4,000 Units (4,000 Units Intravenous Bolus from Bag 06/19/21 2327)  furosemide (LASIX) tablet 40 mg (40 mg Oral Given 06/20/21 2157)  oxyCODONE (Oxy IR/ROXICODONE) immediate release tablet 5 mg (5 mg Oral Given 06/21/21 0546)  iohexol (OMNIPAQUE) 9 MG/ML oral solution (500 mLs  Contrast Given 06/21/21 0906)    Reassessment after intervention:  symptoms unchanged after cardioversion.    I decided to review pertinent External Data, and in summary patient seen last by Cardiology on 2/22 with Cardioversion scheduled for 06/27/21.   Clinical Laboratory Tests Ordered, included potassium is within normal limits.  Troponin is mildly elevated at 24.  Second troponin is pending.  Radiologic Tests Ordered, included CXR. I independently interpreted the images and agree with radiology interpretation.   Cardiac Monitor Tracing which shows rate controlled a flutter.    Social Determinants of Health Risk patient is a smoker.  Consult complete with Cardiology, Dr. Terri Skains. Discussed case in detail.  Patient  with likely multifactorial shortness of breath.  Lower suspicion that his rate controlled atrial flutter is causing his symptoms but patient is adamant that this is the cause of his issue and states has been through this multiple times.  Cardiology advises that could offer cardioversion in the ED and they will follow closely in the office this week to make sure that he is able to stay on his AV nodal blocking medications.   Medical Decision Making: Summary:  Patient presents to the emergency department for evaluation of shortness of breath.  He is in rate controlled a flutter with largely normal vitals.  His x-ray shows some pleural effusion and bibasilar atelectasis versus edema.  Discussed with patient, as above, that his shortness of breath is likely multifactorial and this could be contributing.  He is adamant about not staying in the hospital for additional tests or treatment.  Per my discussion above with cardiology, I did offer cardioversion and patient would like this to be done. He has been complaint with other meds including Xarelto for at least the last 4 weeks.   09:30 PM  Patient cardioverted.  He continues to look about the same to me.  He, as discussed above, likely has many reasons to be short of breath.  He is back in sinus rhythm but blood pressure has not improved significantly either.  I have ordered a CTA PE study although reports compliance with anticoagulation. Have also reached out to [facility] Cardiology regarding his unchanged status after cardioversion as the initial plan was d/c if feeling better.   CTA positive for PE. Patient remains on supplemental O2. Plan for admit.   Reevaluation with update and discussion with patient. Discussed CTA results. He agrees with admit.   Disposition: admit  ____________________________________________  FINAL CLINICAL IMPRESSION(S) / ED DIAGNOSES  Final diagnoses:  Acute respiratory failure with hypoxia (HCC)  Atrial flutter,  unspecified type ([place])  Multiple subsegmental pulmonary emboli without acute cor pulmonale ([place])    Note:  This document was prepared  using Systems analyst and may include unintentional dictation errors.  Nanda Quinton, MD, [facility] Emergency Medicine    Dustine Bertini, [facility], MD 06/21/21 [phone]

## 2021-06-19 NOTE — Sedation Documentation (Addendum)
100j cardioversion conducted, normal sinus rhythm achieved, 65bpm

## 2021-06-19 NOTE — Sedation Documentation (Addendum)
Significant agitation post sedation. Pt calming at this time. Able to follow commands, answer questions.

## 2021-06-19 NOTE — H&P (Signed)
History and Physical   Kevin Orozco:[id number] DOB: 1955/07/20 DOA: 06/19/2021  PCP: Marletta Lor, NP   Patient coming from: Home  Chief Complaint: Shortness of breath, back pain  HPI: Kevin Orozco is a 66 y.o. male with medical history significant of substance use, atrial fibrillation, hyperlipidemia, hypertension, diabetes, claudication, COPD, CHF, CAD, hepatitis C, chronic pain presenting with new pain and shortness of breath.  Patient presenting with shortness of breath that he states is different from his COPD.  He believes it is from his A-fib and is requesting cardioversion.  States he has had several cardioversions in the past and has one scheduled for March 8 anyway.  He is taking samples of a medication but cannot afford to take it on a daily basis so preferred to have cardioversion here.   This was discussed with his cardiologist office and they were okay with proceeding with cardioversion here given his insistence.  Is on Xarelto and he reports taking it daily, but not always at the same time and not always with food.  He denies fevers, chills, abdominal pain, constipation, diarrhea, nausea, vomiting.  ED Course: Vital signs in the ED significant for heart rate in the 110s and improved while he is there to the 70s to 90s, blood pressure in the 90s to 120 systolic, respiratory rate in the teens to 20s, new oxygen requirement of 2 to 4 L.  Lab work-up included BMP with glucose 137 and calcium 8.6.  CBC with hemoglobin stable at 12.2 and platelets stable at 146.  Troponin mildly elevated at 24 and 30 on repeat.  BNP pending and respiratory panel flu COVID pending.  Chest x-ray showed basilar atelectasis versus edema and small effusions.  CTA PE study showed bilateral segmental and subsegmental PEs without right heart strain.  Also showed distended pulmonary trunk, bilateral edema versus atelectasis versus infiltrate, small effusions, nodular liver surface.  Patient received etomidate  and cardioversion in the ED and a 500 cc bolus.  Symptoms persisted despite this and patient was convinced to stay and CTA was done showing pulmonary embolus as above.  As above cardiology was consulted given his request for cardioversion and it appears they will consult on the patient per EDP.  Review of Systems: As per HPI otherwise all other systems reviewed and are negative.  Past Medical History:  Diagnosis Date   Acute respiratory distress 02/22/2015   Atrial fibrillation with RVR (HCC) 04/19/12   New onset; spontaneous conversion to NSR; Pradaxa anticoagulation   CAD (coronary artery disease)    a. Cath 2008- nonobstructive, mod LAD, mild LCx & RCA disease b. Cardiolite 2010 normal c. 03/2012 abnormal Lexiscan Myoview attributed to cocaine d. 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   Elevated LFTs in the setting of acute EtOH abuse, no acute findings on abdominal u/s, HCV+, suspected acute alcohilic heptatitis   HTN (hypertension)    Hypercholesteremia    PAF (paroxysmal atrial fibrillation) (HCC) 02/22/2015   Tobacco abuse     Past Surgical History:  Procedure Laterality Date   APPENDECTOMY     CARDIOVERSION N/A 02/21/2020   Procedure: CARDIOVERSION;  Surgeon: Yates Decamp, MD;  Location: [facility] ENDOSCOPY;  Service: Cardiovascular;  Laterality: N/A;   FOOT SURGERY      Social History  reports that he has been smoking cigarettes. He  has a 40.00 pack-year smoking history. He has never used smokeless tobacco. He reports that he does not currently use alcohol after a past usage of about 12.0 standard drinks per week. He reports current drug use. Drugs: Cocaine, Morphine, and Oxycodone.  Allergies  Allergen Reactions   Etomidate Anxiety   Tape Other (See Comments)    SKIN WILL TEAR EASILY!!!!    Family History  Problem Relation Age of Onset    Heart attack Father 70   Diabetes Mother 89   Diabetes Sister    Heart attack Sister   Reviewed on admission  Prior to Admission medications   Medication Sig Start Date End Date Taking? Authorizing Provider  clonazePAM (KLONOPIN) 0.5 MG tablet Take 0.5-1 mg by mouth See admin instructions. Take 1 mg by mouth at bedtime and an additional 0.5-1 mg in the morning as needed for anxiety    [provider]  diclofenac sodium (VOLTAREN) 1 % GEL Apply 2 g topically 4 (four) times daily as needed (to painful sites).    [provider]  diltiazem (CARDIZEM CD) 120 MG 24 hr capsule Take 1 capsule (120 mg total) by mouth daily. 06/05/21 05/31/22  Cantwell, Celeste C, PA-C  finasteride (PROSCAR) 5 MG tablet Take 5 mg by mouth at bedtime.    [provider]  furosemide (LASIX) 40 MG tablet Take 1 tablet (40 mg total) by mouth daily as needed. For fluid weight gain- Patient taking differently: Take 40 mg by mouth in the morning. 02/22/20 06/05/21  Debbe Odea, MD  gabapentin (NEURONTIN) 300 MG capsule Take 300 mg by mouth at bedtime.    [provider]  ipratropium-albuterol (DUONEB) 0.5-2.5 (3) MG/3ML SOLN Take 3 mLs by nebulization 4 (four) times daily as needed (for shortness of breath or wheezing).    [provider]  isosorbide mononitrate (IMDUR) 30 MG 24 hr tablet Take 1 tablet (30 mg total) by mouth daily. 02/26/15   Allie Bossier, MD  lubiprostone (AMITIZA) 8 MCG capsule Take 8 mcg by mouth daily with breakfast.    [provider]  metoprolol (TOPROL-XL) 200 MG 24 hr tablet Take 1 tablet (200 mg total) by mouth in the morning. 02/02/21   Cantwell, Celeste C, PA-C  morphine (MS CONTIN) 30 MG 12 hr tablet Take 30 mg by mouth See admin instructions. Take 30 mg by mouth at 8 AM and 8 PM    [provider]  MOVANTIK 12.5 MG TABS tablet Take 12.5 mg by mouth every morning. 05/30/21   [provider]  Naloxone HCl (KLOXXADO) 8 MG/0.1ML LIQD  Place into the nose as needed (for accidental overdose).    [provider]  nitroGLYCERIN (NITROSTAT) 0.4 MG SL tablet Place under the tongue every 5 (five) minutes as needed for chest pain.    [provider]  oxyCODONE-acetaminophen (PERCOCET) 10-325 MG tablet Take 1 tablet by mouth every 4 (four) hours as needed. 05/28/21   [provider]  pantoprazole (PROTONIX) 40 MG tablet Take 40 mg by mouth daily before breakfast.    [provider]  predniSONE (DELTASONE) 10 MG tablet Take 10 mg by mouth daily with breakfast.    [provider]  ranolazine (RANEXA) 500 MG 12 hr tablet TAKE ONE TABLET BY MOUTH TWICE DAILY Patient taking differently: Take 500 mg by mouth in the morning and at bedtime. 02/06/21   Cantwell, Celeste C, PA-C  rosuvastatin (CRESTOR) 10 MG tablet Take 1 tablet (10 mg total) by mouth  daily. 01/08/21 06/05/21  [name], Anabel Bene, MD  sertraline (ZOLOFT) 50 MG tablet Take 50 mg by mouth daily.    [provider]  spironolactone (ALDACTONE) 25 MG tablet Take 1 tablet (25 mg total) by mouth daily. 02/23/20   Calvert Cantor, MD  tamsulosin (FLOMAX) 0.4 MG CAPS capsule Take 0.4 mg by mouth at bedtime. 08/21/16   [provider]  traZODone (DESYREL) 100 MG tablet Take 50-100 mg by mouth at bedtime as needed for sleep.    [provider]  Dwyane Luo 200-62.5-25 MCG/INH AEPB Inhale 1 puff into the lungs daily. 01/31/21   [provider]  XARELTO 20 MG TABS tablet Take 1 tablet (20 mg total) by mouth daily with supper. 03/07/21   Rayford Halsted, PA-C    Physical Exam: Vitals:   06/19/21 2215 06/19/21 2245 06/20/21 0000 06/20/21 0015  BP: 99/70 104/74  134/83  Pulse: 74 65 82 72  Resp: (!) 21 (!) 25 (!) 28 (!) 28  Temp:      TempSrc:      SpO2: 92% 94% (!) 88% 100%  Weight: 93 kg       Physical Exam Constitutional:      General: He is not in acute distress.    Appearance: Normal appearance.   HENT:     Head: Normocephalic and atraumatic.     Mouth/Throat:     Mouth: Mucous membranes are moist.     Pharynx: Oropharynx is clear.  Eyes:     Extraocular Movements: Extraocular movements intact.     Pupils: Pupils are equal, round, and reactive to light.  Cardiovascular:     Rate and Rhythm: Normal rate and regular rhythm.     Pulses: Normal pulses.     Heart sounds: Normal heart sounds.  Pulmonary:     Effort: Pulmonary effort is normal. No respiratory distress.     Breath sounds: Rales present.  Abdominal:     General: Bowel sounds are normal. There is no distension.     Palpations: Abdomen is soft.     Tenderness: There is no abdominal tenderness.  Musculoskeletal:        General: No swelling or deformity.  Skin:    General: Skin is warm and dry.  Neurological:     General: No focal deficit present.     Mental Status: Mental status is at baseline.    Labs on Admission: I have personally reviewed following labs and imaging studies  CBC: Recent Labs  Lab 06/19/21 1519  WBC 9.7  HGB 12.2*  HCT 37.2*  MCV 107.8*  PLT 146*    Basic Metabolic Panel: Recent Labs  Lab 06/19/21 1519 06/19/21 1711  NA 135  --   K 3.9  --   CL 103  --   CO2 23  --   GLUCOSE 137*  --   BUN 19  --   CREATININE 0.92  --   CALCIUM 8.6*  --   MG  --  2.1    GFR: Estimated Creatinine Clearance: 95.8 mL/min (by C-G formula based on SCr of 0.92 mg/dL).  Liver Function Tests: No results for input(s): AST, ALT, ALKPHOS, BILITOT, PROT, ALBUMIN in the last 168 hours.  Urine analysis: No results found for: COLORURINE, APPEARANCEUR, LABSPEC, PHURINE, GLUCOSEU, HGBUR, BILIRUBINUR, KETONESUR, PROTEINUR, UROBILINOGEN, NITRITE, LEUKOCYTESUR  Radiological Exams on Admission: DG Chest 2 View  Result Date: 06/19/2021 CLINICAL DATA:  Shortness of breath. EXAM: CHEST - 2 VIEW COMPARISON:  February 23, 2021. FINDINGS: Stable cardiomediastinal silhouette. Increased bibasilar atelectasis or  edema is noted with small bilateral pleural effusions. Bony thorax is unremarkable. IMPRESSION: Increased bibasilar atelectasis or edema is noted with small bilateral pleural effusions. Electronically Signed   By: Marijo Conception M.D.   On: 06/19/2021 15:55   CT Angio Chest PE W and/or Wo Contrast  Result Date: 06/19/2021 CLINICAL DATA:  Pulmonary embolism suspected, high probability. EXAM: CT ANGIOGRAPHY CHEST WITH CONTRAST TECHNIQUE: Multidetector CT imaging of the chest was performed using the standard protocol during bolus administration of intravenous contrast. Multiplanar CT image reconstructions and MIPs were obtained to evaluate the vascular anatomy. RADIATION DOSE REDUCTION: This exam was performed according to the departmental dose-optimization program which includes automated exposure control, adjustment of the mA and/or kV according to patient size and/or use of iterative reconstruction technique. CONTRAST:  88mL OMNIPAQUE IOHEXOL 350 MG/ML SOLN COMPARISON:  None. FINDINGS: Cardiovascular: The heart is enlarged and there is no pericardial effusion. Multi-vessel coronary artery calcifications are noted. There is atherosclerotic calcification of the aorta with mild aneurysmal dilatation of the ascending aorta measuring 4.1 cm. The pulmonary trunk is distended suggesting underlying pulmonary artery hypertension. Examination of the pulmonary arteries is limited due to respiratory motion artifact. Pulmonary artery filling defects are present in the segmental and subsegmental arteries in the left upper and lower lobes and right middle and lower lobes. No evidence of right heart strain. Mediastinum/Nodes: Shotty lymph nodes are present in the mediastinum and hilar regions bilaterally which may be reactive. No axillary lymphadenopathy. The thyroid gland, trachea, and esophagus are within normal limits. A few prominent lymph nodes are present in the periesophageal fat, not well evaluated due to respiratory  motion artifact. Lungs/Pleura: Hazy attenuation is noted in the lungs bilaterally, greater on the right than on the left. There is patchy atelectasis or infiltrate at the lung bases. Small bilateral pleural effusions are noted. No pneumothorax. Upper Abdomen: The liver has a nodular contour suggesting underlying cirrhosis. There is reflux of contrast into the inferior vena cava and hepatic veins suggesting right heart failure. Cysts are noted in the left kidney. No acute abnormality. Musculoskeletal: No acute osseous abnormality. Review of the MIP images confirms the above findings. IMPRESSION: 1. Bilateral segmental and subsegmental pulmonary emboli. No evidence of right heart strain. 2. Distended pulmonary trunk which may be associated with underlying pulmonary artery hypertension. Reflux of contrast into the inferior vena cava and hepatic veins may be associated with right heart failure. 3. Hazy attenuation of the lungs bilaterally with patchy airspace disease in the lower lobes, possible edema, atelectasis, and/or infiltrate. 4. Small bilateral pleural effusions. 5. Aortic atherosclerosis with aneurysmal dilatation of the ascending aorta measuring up to 4.1 cm. Recommend annual imaging followup by CTA or MRA. This recommendation follows 2010 ACCF/AHA/AATS/ACR/ASA/SCA/SCAI/SIR/STS/SVM Guidelines for the Diagnosis and Management of Patients with Thoracic Aortic Disease. Circulation. 2010; 121ML:[id number]. Aortic aneurysm NOS (ICD10-I71.9) 6. Cardiomegaly with coronary artery calcifications. 7. Slightly nodular contour of the liver, suggesting underlying cirrhosis. 8. Left renal cysts. Critical findings were reported to Dr. Laverta Baltimore at 10:29 p.m. Electronically Signed   By: Brett Fairy M.D.   On: 06/19/2021 22:30    EKG: Independently reviewed.  Initial EKG showed atrial flutter with variable block at 85 bpm.  Evidence of LVH and prolonged QTc at 525.... Repeat EKG showed sinus rhythm at 76 bpm.  Continued prolonged  QTc at 532.  Assessment/Plan Principal Problem:   Acute pulmonary embolism (HCC) Active Problems:   CAD (  coronary artery disease)   Hyperlipidemia   Essential hypertension   Paroxysmal A-fib (HCC)   Acute respiratory failure with hypoxia (HCC)   Chronic heart failure with preserved ejection fraction (HCC)   Chronic obstructive pulmonary disease (HCC)   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HCC)   Anxiety   Acute pulmonary embolism Acute respiratory failure with hypoxia > Patient presenting with shortness of breath and some back pain, he was initially concerned it was due to his A-fib as it was different from his dyspnea he gets with COPD. > Symptoms persisted despite cardioversion in the E and subsequent work-up included a CTA PE study which was positive for segmental and subsegmental pulmonary emboli without right heart strain. > Patient requiring 2 to 4 L to maintain saturations in the ED. > Patient has been prescribed Xarelto daily outpatient, he states he tries to take it daily with meals however he has been taking it daily in a time range of 12 PM to 6 PM and takes it without food around 3 days a week.  So may not be a true failure of Xarelto. - Monitor on progressive unit - Continue with already ordered nitro drip - Echocardiogram - We will need to consider new outpatient regimen structure*  Atrial fibrillation > Known history of atrial fibrillation with multiple cardioversions. > Has intolerances or insurance issues with affording rhythm control medications. > Cardioverted in the ED, sinus rhythm on repeat EKG. - Continue home diltiazem, metoprolol - On heparin for now  CAD Angina - Continue home Imdur, metoprolol, Ranexa - Continue home rosuvastatin  CHF > Last echo October 2022 with EF [id number], diastolic function not assessed. - Continue home metoprolol, spironolactone, Lasix  Hypertension - Continue diltiazem, Imdur, metoprolol,  spironolactone  Hyperlipidemia - Continue home rosuvastatin  COPD - Replace home Trelegy with formulary Breo and Incruse - As needed DuoNebs - Continue Daily Prednisone  Chronic pain - Continue home morphine and as needed oxycodone -Will need to confirm if he takes lubiprostone, Movantik or both, resume when confirmed by pharmacy  Anxiety Insomnia - Continue home as needed Klonopin - Continue home trazodone,   DM  - SSI  BPH? - Confirm Rx for finasteride, Tamsulosin and resume if confirmed by pharmacy   DVT prophylaxis: Heparin Code Status:   Full Family Communication:  On admission.  Patient states there is no need to be notified and he does not currently have any family to notify. Disposition Plan:   Patient is from:  Home  Anticipated DC to:  Home  Anticipated DC date:  1 to 2 days  Anticipated DC barriers: None  Consults called:  Cardiology, [facility] cardiology, consulted by EDP.  It appears that they will consult on the patient, but this is not certain. Admission status:  Observation, progressive  Severity of Illness: The appropriate patient status for this patient is OBSERVATION. Observation status is judged to be reasonable and necessary in order to provide the required intensity of service to ensure the patient's safety. The patient's presenting symptoms, physical exam findings, and initial radiographic and laboratory data in the context of their medical condition is felt to place them at decreased risk for further clinical deterioration. Furthermore, it is anticipated that the patient will be medically stable for discharge from the hospital within 2 midnights of admission.    Marcelyn Bruins MD [facility]  How to contact the [facility] Attending or Consulting provider [place] or covering provider during after hours [place],  for this patient?   Check the care team in [facility] and look for a) attending/consulting [facility] provider listed and b) the [facility] team listed Log  into [email or web address] and use [place]'s universal password to access. If you do not have the password, please contact the hospital operator. Locate the [facility] provider you are looking for under [facility] and page to a number that you can be directly reached. If you still have difficulty reaching the provider, please page the [facility] (Director on Call) for the Hospitalists listed on amion for assistance.  06/20/2021, 12:17 AM

## 2021-06-19 NOTE — ED Provider Triage Note (Signed)
Emergency Medicine Provider Triage Evaluation Note  Kevin Orozco , a 66 y.o. male  was evaluated in triage.  Pt complains of shortness of breath is been ongoing for the last week or so.  Patient does have a history of atrial fibrillation on Xarelto.  Cannot afford to rate control medication.  Patient is still smoking.  Patient was here back in November for similar symptoms and was cardioverted at that time.  Review of Systems  Positive:  Negative: See above   Physical Exam  BP 95/64 (BP Location: Right Arm)    Pulse 89    Temp 97.7 F (36.5 C) (Oral)    Resp 20    SpO2 96%  Gen:   Awake, no distress   Resp:  Tachypneic MSK:   Moves extremities without difficulty  Other:  Rhythm is regular  Medical Decision Making  Medically screening exam initiated at 3:10 PM.  Appropriate orders placed.  Virge Sohl was informed that the remainder of the evaluation will be completed by another provider, this initial triage assessment does not replace that evaluation, and the importance of remaining in the ED until their evaluation is complete.  Patient is 97% on room air.  He is having alternating tachycardia and bradycardia.  Patient is tachypneic despite having high oxygenation sats.  He is in need of emergent care at this time.   Myna Bright [place], [place] 06/19/21 1512

## 2021-06-19 NOTE — ED Notes (Signed)
Patient transported to CT 

## 2021-06-20 ENCOUNTER — Encounter (HOSPITAL_COMMUNITY): Payer: Self-pay | Admitting: Internal Medicine

## 2021-06-20 ENCOUNTER — Inpatient Hospital Stay (HOSPITAL_COMMUNITY): Payer: Medicaid Other

## 2021-06-20 DIAGNOSIS — N182 Chronic kidney disease, stage 2 (mild): Secondary | ICD-10-CM | POA: Diagnosis present

## 2021-06-20 DIAGNOSIS — I2694 Multiple subsegmental pulmonary emboli without acute cor pulmonale: Principal | ICD-10-CM

## 2021-06-20 DIAGNOSIS — R0602 Shortness of breath: Secondary | ICD-10-CM | POA: Diagnosis present

## 2021-06-20 DIAGNOSIS — E7800 Pure hypercholesterolemia, unspecified: Secondary | ICD-10-CM | POA: Diagnosis present

## 2021-06-20 DIAGNOSIS — I130 Hypertensive heart and chronic kidney disease with heart failure and stage 1 through stage 4 chronic kidney disease, or unspecified chronic kidney disease: Secondary | ICD-10-CM | POA: Diagnosis present

## 2021-06-20 DIAGNOSIS — I4892 Unspecified atrial flutter: Secondary | ICD-10-CM | POA: Diagnosis present

## 2021-06-20 DIAGNOSIS — G8929 Other chronic pain: Secondary | ICD-10-CM | POA: Diagnosis present

## 2021-06-20 DIAGNOSIS — R0603 Acute respiratory distress: Secondary | ICD-10-CM

## 2021-06-20 DIAGNOSIS — J441 Chronic obstructive pulmonary disease with (acute) exacerbation: Secondary | ICD-10-CM | POA: Diagnosis present

## 2021-06-20 DIAGNOSIS — Z888 Allergy status to other drugs, medicaments and biological substances status: Secondary | ICD-10-CM | POA: Diagnosis not present

## 2021-06-20 DIAGNOSIS — G4700 Insomnia, unspecified: Secondary | ICD-10-CM | POA: Diagnosis present

## 2021-06-20 DIAGNOSIS — I5033 Acute on chronic diastolic (congestive) heart failure: Secondary | ICD-10-CM | POA: Diagnosis present

## 2021-06-20 DIAGNOSIS — F1011 Alcohol abuse, in remission: Secondary | ICD-10-CM | POA: Diagnosis present

## 2021-06-20 DIAGNOSIS — D72829 Elevated white blood cell count, unspecified: Secondary | ICD-10-CM | POA: Diagnosis present

## 2021-06-20 DIAGNOSIS — E1122 Type 2 diabetes mellitus with diabetic chronic kidney disease: Secondary | ICD-10-CM | POA: Diagnosis present

## 2021-06-20 DIAGNOSIS — J9811 Atelectasis: Secondary | ICD-10-CM | POA: Diagnosis present

## 2021-06-20 DIAGNOSIS — F17210 Nicotine dependence, cigarettes, uncomplicated: Secondary | ICD-10-CM | POA: Diagnosis present

## 2021-06-20 DIAGNOSIS — J9621 Acute and chronic respiratory failure with hypoxia: Secondary | ICD-10-CM | POA: Diagnosis present

## 2021-06-20 DIAGNOSIS — I248 Other forms of acute ischemic heart disease: Secondary | ICD-10-CM | POA: Diagnosis present

## 2021-06-20 DIAGNOSIS — F419 Anxiety disorder, unspecified: Secondary | ICD-10-CM | POA: Diagnosis present

## 2021-06-20 DIAGNOSIS — E876 Hypokalemia: Secondary | ICD-10-CM | POA: Diagnosis not present

## 2021-06-20 DIAGNOSIS — I480 Paroxysmal atrial fibrillation: Secondary | ICD-10-CM | POA: Diagnosis present

## 2021-06-20 DIAGNOSIS — I25118 Atherosclerotic heart disease of native coronary artery with other forms of angina pectoris: Secondary | ICD-10-CM | POA: Diagnosis present

## 2021-06-20 DIAGNOSIS — F1120 Opioid dependence, uncomplicated: Secondary | ICD-10-CM | POA: Diagnosis present

## 2021-06-20 DIAGNOSIS — Z20822 Contact with and (suspected) exposure to covid-19: Secondary | ICD-10-CM | POA: Diagnosis present

## 2021-06-20 DIAGNOSIS — I2699 Other pulmonary embolism without acute cor pulmonale: Secondary | ICD-10-CM | POA: Diagnosis not present

## 2021-06-20 DIAGNOSIS — E1151 Type 2 diabetes mellitus with diabetic peripheral angiopathy without gangrene: Secondary | ICD-10-CM | POA: Diagnosis present

## 2021-06-20 LAB — CBC
HCT: 41.5 % (ref 39.0–52.0)
Hemoglobin: 13.8 g/dL (ref 13.0–17.0)
MCH: 35.8 pg — ABNORMAL HIGH (ref 26.0–34.0)
MCHC: 33.3 g/dL (ref 30.0–36.0)
MCV: 107.5 fL — ABNORMAL HIGH (ref 80.0–100.0)
Platelets: 163 10*3/uL (ref 150–400)
RBC: 3.86 MIL/uL — ABNORMAL LOW (ref 4.22–5.81)
RDW: 14.6 % (ref 11.5–15.5)
WBC: 14.3 10*3/uL — ABNORMAL HIGH (ref 4.0–10.5)
nRBC: 0.1 % (ref 0.0–0.2)

## 2021-06-20 LAB — CBG MONITORING, ED
Glucose-Capillary: 90 mg/dL (ref 70–99)
Glucose-Capillary: 107 mg/dL — ABNORMAL HIGH (ref 70–99)

## 2021-06-20 LAB — BASIC METABOLIC PANEL
Anion gap: 11 (ref 5–15)
BUN: 21 mg/dL (ref 8–23)
CO2: 21 mmol/L — ABNORMAL LOW (ref 22–32)
Calcium: 8.5 mg/dL — ABNORMAL LOW (ref 8.9–10.3)
Chloride: 105 mmol/L (ref 98–111)
Creatinine, Ser: 0.88 mg/dL (ref 0.61–1.24)
GFR, Estimated: >60 mL/min (ref >60)
Glucose, Bld: 81 mg/dL (ref 70–99)
Potassium: 4.1 mmol/L (ref 3.5–5.1)
Sodium: 137 mmol/L (ref 135–145)

## 2021-06-20 LAB — GLUCOSE, CAPILLARY: Glucose-Capillary: 104 mg/dL — ABNORMAL HIGH (ref 70–99)

## 2021-06-20 LAB — ECHOCARDIOGRAM COMPLETE
AR max vel: 3.25 cm2
AV Area VTI: 3.21 cm2
AV Area mean vel: 3.09 cm2
AV Mean grad: 3 mmHg
AV Peak grad: 5.7 mmHg
Ao pk vel: 1.19 m/s
Area-P 1/2: 3.53 cm2
P 1/2 time: 535 msec
S' Lateral: 3 cm
Weight: 3280 oz

## 2021-06-20 LAB — RESP PANEL BY RT-PCR (FLU A&B, COVID) ARPGX2
Influenza A by PCR: NEGATIVE
Influenza B by PCR: NEGATIVE
SARS Coronavirus 2 by RT PCR: NEGATIVE

## 2021-06-20 LAB — RAPID URINE DRUG SCREEN, HOSP PERFORMED
Amphetamines: NOT DETECTED
Barbiturates: NOT DETECTED
Benzodiazepines: POSITIVE — AB
Cocaine: NOT DETECTED
Opiates: POSITIVE — AB
Tetrahydrocannabinol: NOT DETECTED

## 2021-06-20 LAB — APTT
aPTT: 56 seconds — ABNORMAL HIGH (ref 24–36)
aPTT: 52 seconds — ABNORMAL HIGH (ref 24–36)

## 2021-06-20 LAB — HIV ANTIBODY (ROUTINE TESTING W REFLEX): HIV Screen 4th Generation wRfx: NONREACTIVE

## 2021-06-20 LAB — BRAIN NATRIURETIC PEPTIDE: B Natriuretic Peptide: 708 pg/mL — ABNORMAL HIGH (ref 0.0–100.0)

## 2021-06-20 LAB — HEPARIN LEVEL (UNFRACTIONATED): Heparin Unfractionated: >1.1 IU/mL — ABNORMAL HIGH (ref 0.30–0.70)

## 2021-06-20 MED ORDER — FUROSEMIDE 10 MG/ML IJ SOLN
40.0000 mg | Freq: Two times a day (BID) | INTRAMUSCULAR | Status: DC
  Administered 2021-06-20 – 2021-06-22 (×4): 40 mg via INTRAVENOUS
  Filled 2021-06-20 (×5): qty 4

## 2021-06-20 MED ORDER — GABAPENTIN 300 MG PO CAPS
300.0000 mg | ORAL_CAPSULE | Freq: Every day | ORAL | Status: DC
  Administered 2021-06-20 – 2021-06-25 (×7): 300 mg via ORAL
  Filled 2021-06-20 (×7): qty 1

## 2021-06-20 MED ORDER — FUROSEMIDE 40 MG PO TABS
40.0000 mg | ORAL_TABLET | Freq: Once | ORAL | Status: AC
Start: 2021-06-20 — End: 2021-06-20
  Administered 2021-06-20: 40 mg via ORAL
  Filled 2021-06-20: qty 1

## 2021-06-20 MED ORDER — CLONAZEPAM 0.5 MG PO TABS
0.5000 mg | ORAL_TABLET | Freq: Three times a day (TID) | ORAL | Status: DC | PRN
  Administered 2021-06-20: 1 mg via ORAL
  Administered 2021-06-20: 0.5 mg via ORAL
  Administered 2021-06-23 – 2021-06-25 (×3): 1 mg via ORAL
  Filled 2021-06-20 (×2): qty 1
  Filled 2021-06-20: qty 2
  Filled 2021-06-20: qty 1
  Filled 2021-06-20: qty 2
  Filled 2021-06-20: qty 1
  Filled 2021-06-20: qty 2

## 2021-06-20 MED ORDER — CLONAZEPAM 0.5 MG PO TABS: 0.5000 mg | ORAL_TABLET | Freq: Three times a day (TID) | ORAL | Status: DC | PRN

## 2021-06-20 MED ORDER — ENOXAPARIN SODIUM 100 MG/ML IJ SOSY
1.0000 mg/kg | PREFILLED_SYRINGE | Freq: Two times a day (BID) | INTRAMUSCULAR | Status: DC
  Administered 2021-06-21: 92.5 mg via SUBCUTANEOUS
  Filled 2021-06-20: qty 1

## 2021-06-20 MED ORDER — TRAZODONE HCL 50 MG PO TABS
50.0000 mg | ORAL_TABLET | Freq: Every evening | ORAL | Status: DC | PRN
  Administered 2021-06-20: 50 mg via ORAL
  Administered 2021-06-21: 100 mg via ORAL
  Administered 2021-06-22: 50 mg via ORAL
  Administered 2021-06-23 – 2021-06-25 (×3): 100 mg via ORAL
  Filled 2021-06-20: qty 2
  Filled 2021-06-20: qty 1
  Filled 2021-06-20 (×4): qty 2

## 2021-06-20 MED ORDER — PREDNISONE 10 MG PO TABS
10.0000 mg | ORAL_TABLET | Freq: Every day | ORAL | Status: DC
Start: 2021-06-20 — End: 2021-06-26
  Administered 2021-06-20 – 2021-06-26 (×7): 10 mg via ORAL
  Filled 2021-06-20 (×7): qty 1

## 2021-06-20 MED ORDER — INSULIN ASPART 100 UNIT/ML IJ SOLN: 0.0000 [IU] | Freq: Three times a day (TID) | INTRAMUSCULAR | Status: DC

## 2021-06-20 NOTE — ED Notes (Signed)
Pt called out via call bell, RN found pt sitting on end of bed stating "I cant breathe" tripoding. Pt was on 4lpm [place] at that time. With the assistance of second RN, pt was slid back onto bed and a NR was placed on patient with relief. Admitting MD paged.  ?

## 2021-06-20 NOTE — Plan of Care (Signed)
?  Problem: Clinical Measurements: ?Goal: Respiratory complications will improve ?Outcome: Progressing ?Goal: Cardiovascular complication will be avoided ?Outcome: Progressing ?  ?Problem: Coping: ?Goal: Level of anxiety will decrease ?Outcome: Progressing ?  ?Problem: Elimination: ?Goal: Will not experience complications related to urinary retention ?Outcome: Progressing ?  ?Problem: Pain Managment: ?Goal: General experience of comfort will improve ?Outcome: Progressing ?  ?Problem: Cardiac: ?Goal: Ability to achieve and maintain adequate cardiopulmonary perfusion will improve ?Outcome: Progressing ?  ?

## 2021-06-20 NOTE — Consult Note (Signed)
NAME:  Kevin Orozco, MRN:  [id number], DOB:  [birth year], LOS: 0 ADMISSION DATE:  06/19/2021, CONSULTATION DATE: 06/20/2021 REFERRING MD: I had, CHIEF COMPLAINT: Hypoxic respiratory failure  History of Present Illness:  66 year old male lifelong smoker polysubstance abuse cocaine and marijuana use had multiple episodes of increasing shortness of breath usually associated with atrial for breath ventricular response.  Similar episode currently over the last 3 to 4 days.  Noted to have bilateral PEs despite being on Xarelto.  He has been transitioned to noninvasive mechanical ventilatory support with an FiO2 60% with O2 sats of 90% and tidal volumes of 1100 cc with a rate of 16.  Pulmonary critical care asked to evaluate due to the fact he has had PEs in the setting of anticoagulation with Xarelto for his history of atrial fibrillation.  Despite being in a sinus rhythm he is hypoxic.  Reports a pack and half a day smoker lifelong.  Denies fevers chills sweats or purulent sputum at this time.  He is a full code and should he deteriorate will require intubation and full mechanical ventilatory support.  Pertinent  Medical History   Past Medical History:  Diagnosis Date   Acute respiratory distress 02/22/2015   Atrial fibrillation with RVR (HCC) 04/19/12   New onset; spontaneous conversion to NSR; Pradaxa anticoagulation   CAD (coronary artery disease)    a. Cath 2008- nonobstructive, mod LAD, mild LCx & RCA disease b. Cardiolite 2010 normal c. 03/2012 abnormal Lexiscan Myoview attributed to cocaine d. 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   Elevated LFTs in the setting of acute EtOH abuse, no acute findings on abdominal u/s, HCV+, suspected acute alcohilic heptatitis   HTN (hypertension)    Hypercholesteremia    PAF (paroxysmal atrial fibrillation) (HCC)  02/22/2015   Tobacco abuse      Significant Hospital Events: Including procedures, antibiotic start and stop dates in addition to other pertinent events   06/20/2021 placed on BiPAP  Interim History / Subjective:  Currently on noninvasive mechanical ventilatory support  Objective   Blood pressure (!) 146/86, pulse 73, temperature 97.7 F (36.5 C), temperature source Oral, resp. rate (!) 27, weight 93 kg, SpO2 92 %.    FiO2 (%):  [65 %] 65 %   Intake/Output Summary (Last 24 hours) at 06/20/2021 1046 Last data filed at 06/20/2021 0700 Gross per 24 hour  Intake --  Output 800 ml  Net -800 ml   Filed Weights   06/19/21 2215  Weight: 93 kg    Examination: General: Malnourished male currently on BiPAP no acute distress HENT: Fullface mask in place.  Disheveled appearance Lungs: Air movement Cardiovascular: Regular rate regular rhythm Abdomen: Protuberant soft Extremities: Without edema warm Neuro: Grossly intact no focal defect not interested in having a conversation with the examiner at this time. GU: Voids  Resolved Hospital Problem list     Assessment & Plan:  Acute hypoxic respiratory failure in the setting of the new diagnosis of bilateral PE despite being on Xarelto also in the setting of congestive heart failure, ongoing tobacco abuse, reported last drink was in December 2022.  Currently on noninvasive mechanical dilatory support at 60% with a O2 saturation of 90%.  Status post cardioversion for atrial fibrillation rapid ventricular response currently in sinus bradycardia. Agree with diuresis Currently on noninvasive  mechanical ventilatory support hopefully with aggressive diuresis he can come off BiPAP. Oxygen to keep sats greater than 90% currently on 60% Continue bronchodilator He is on chronic steroid therapy Noted to be on Neurontin, MS Contin, oxycodone as home medication which may lead to respiratory insufficiency. Full code and if needed will be intubated if  unresponsive to current treatment Consider smoking cessation  Atrial fibrillation with rapid ventricular response Status post cardioversion to normal sinus rhythm 06/19/2018 Per cardiology  Bilateral pulmonary embolus despite being on anticoagulation with Xarelto Systemic heparin  History of substance abuse cocaine Check UDS for complete  History of EtOH abuse Last drink was in December 2022 Consider thiamine folic acid     Best Practice (right click and "Reselect all SmartList Selections" daily)   Diet/type: Regular consistency (see orders) DVT prophylaxis: systemic heparin GI prophylaxis: PPI Lines: N/A Foley:  N/A Code Status:  full code Last date of multidisciplinary goals of care discussion [tbd]  Labs   CBC: Recent Labs  Lab 06/19/21 1519 06/20/21 0425  WBC 9.7 14.3*  HGB 12.2* 13.8  HCT 37.2* 41.5  MCV 107.8* 107.5*  PLT 146* 163    Basic Metabolic Panel: Recent Labs  Lab 06/19/21 1519 06/19/21 1711 06/20/21 0425  NA 135  --  137  K 3.9  --  4.1  CL 103  --  105  CO2 23  --  21*  GLUCOSE 137*  --  81  BUN 19  --  21  CREATININE 0.92  --  0.88  CALCIUM 8.6*  --  8.5*  MG  --  2.1  --    GFR: Estimated Creatinine Clearance: 100.1 mL/min (by C-G formula based on SCr of 0.88 mg/dL). Recent Labs  Lab 06/19/21 1519 06/20/21 0425  WBC 9.7 14.3*    Liver Function Tests: No results for input(s): AST, ALT, ALKPHOS, BILITOT, PROT, ALBUMIN in the last 168 hours. No results for input(s): LIPASE, AMYLASE in the last 168 hours. No results for input(s): AMMONIA in the last 168 hours.  ABG    Component Value Date/Time   PHART 7.465 (H) 02/22/2015 1213   PCO2ART 29.9 (L) 02/22/2015 1213   PO2ART 63.0 (L) 02/22/2015 1213   HCO3 21.5 02/22/2015 1213   TCO2 22 02/22/2015 1213   ACIDBASEDEF 1.0 02/22/2015 1213   O2SAT 93.0 02/22/2015 1213     Coagulation Profile: No results for input(s): INR, PROTIME in the last 168 hours.  Cardiac Enzymes: No  results for input(s): CKTOTAL, CKMB, CKMBINDEX, TROPONINI in the last 168 hours.  HbA1C: Hgb A1c MFr Bld  Date/Time Value Ref Range Status  09/09/2019 11:22 AM 5.4 4.8 - 5.6 % Final    Comment:             Prediabetes: 5.7 - 6.4          Diabetes: >6.4          Glycemic control for adults with diabetes: <7.0   02/22/2015 01:28 PM 5.0 4.8 - 5.6 % Final    Comment:    (NOTE)         Pre-diabetes: 5.7 - 6.4         Diabetes: >6.4         Glycemic control for adults with diabetes: <7.0     CBG: Recent Labs  Lab 06/20/21 0735  GLUCAP 90    Review of Systems:   10 point review of system taken, please see HPI for positives and negatives. Positive for shortness  of breath x3 to 4 days Denies current chest pain   Past Medical History:  He,  has a past medical history of Acute respiratory distress (02/22/2015), Atrial fibrillation with RVR (HCC) (04/19/12), CAD (coronary artery disease), Cocaine abuse (HCC), DM type 2 (diabetes mellitus, type 2) (HCC), ETOH abuse, HCV antibody positive (03/2012), HTN (hypertension), Hypercholesteremia, PAF (paroxysmal atrial fibrillation) (HCC) (02/22/2015), and Tobacco abuse.   Surgical History:   Past Surgical History:  Procedure Laterality Date   APPENDECTOMY     CARDIOVERSION N/A 02/21/2020   Procedure: CARDIOVERSION;  Surgeon: Yates Decamp, MD;  Location: [facility] ENDOSCOPY;  Service: Cardiovascular;  Laterality: N/A;   FOOT SURGERY       Social History:   reports that he has been smoking cigarettes. He has a 40.00 pack-year smoking history. He has never used smokeless tobacco. He reports that he does not currently use alcohol after a past usage of about 12.0 standard drinks per week. He reports current drug use. Drugs: Cocaine, Morphine, and Oxycodone.   Family History:  His family history includes Diabetes in his sister; Diabetes (age of onset: 38) in his mother; Heart attack in his sister; Heart attack (age of onset: 12) in his father.    Allergies Allergies  Allergen Reactions   Etomidate Anxiety   Tape Other (See Comments)    SKIN WILL TEAR EASILY!!!!     Home Medications  Prior to Admission medications   Medication Sig Start Date End Date Taking? Authorizing Provider  clonazePAM (KLONOPIN) 0.5 MG tablet Take 0.5-1 mg by mouth See admin instructions. Take 1 mg by mouth at bedtime and an additional 0.5-1 mg in the morning as needed for anxiety    [provider]  diclofenac sodium (VOLTAREN) 1 % GEL Apply 2 g topically 4 (four) times daily as needed (to painful sites).    [provider]  diltiazem (CARDIZEM CD) 120 MG 24 hr capsule Take 1 capsule (120 mg total) by mouth daily. 06/05/21 05/31/22  Cantwell, Celeste C, PA-C  finasteride (PROSCAR) 5 MG tablet Take 5 mg by mouth at bedtime.    [provider]  furosemide (LASIX) 40 MG tablet Take 1 tablet (40 mg total) by mouth daily as needed. For fluid weight gain- Patient taking differently: Take 40 mg by mouth in the morning. 02/22/20 06/05/21  Calvert Cantor, MD  gabapentin (NEURONTIN) 300 MG capsule Take 300 mg by mouth at bedtime.    [provider]  ipratropium-albuterol (DUONEB) 0.5-2.5 (3) MG/3ML SOLN Take 3 mLs by nebulization 4 (four) times daily as needed (for shortness of breath or wheezing).    [provider]  isosorbide mononitrate (IMDUR) 30 MG 24 hr tablet Take 1 tablet (30 mg total) by mouth daily. 02/26/15   Drema Dallas, MD  lubiprostone (AMITIZA) 8 MCG capsule Take 8 mcg by mouth daily with breakfast.    [provider]  metoprolol (TOPROL-XL) 200 MG 24 hr tablet Take 1 tablet (200 mg total) by mouth in the morning. 02/02/21   Cantwell, Celeste C, PA-C  morphine (MS CONTIN) 30 MG 12 hr tablet Take 30 mg by mouth See admin instructions. Take 30 mg by mouth at 8 AM and 8 PM    [provider]  MOVANTIK 12.5 MG TABS tablet Take 12.5 mg by mouth every morning. 05/30/21   [provider]   Naloxone HCl (KLOXXADO) 8 MG/0.1ML LIQD Place into the nose as needed (for accidental overdose).    [provider]  nitroGLYCERIN (  NITROSTAT) 0.4 MG SL tablet Place under the tongue every 5 (five) minutes as needed for chest pain.    [provider]  oxyCODONE-acetaminophen (PERCOCET) 10-325 MG tablet Take 1 tablet by mouth every 4 (four) hours as needed. 05/28/21   [provider]  pantoprazole (PROTONIX) 40 MG tablet Take 40 mg by mouth daily before breakfast.    [provider]  predniSONE (DELTASONE) 10 MG tablet Take 10 mg by mouth daily with breakfast.    [provider]  ranolazine (RANEXA) 500 MG 12 hr tablet TAKE ONE TABLET BY MOUTH TWICE DAILY Patient taking differently: Take 500 mg by mouth in the morning and at bedtime. 02/06/21   Cantwell, Celeste C, PA-C  rosuvastatin (CRESTOR) 10 MG tablet Take 1 tablet (10 mg total) by mouth daily. 01/08/21 06/05/21  [name], Anabel Bene, MD  sertraline (ZOLOFT) 50 MG tablet Take 50 mg by mouth daily.    [provider]  spironolactone (ALDACTONE) 25 MG tablet Take 1 tablet (25 mg total) by mouth daily. 02/23/20   Calvert Cantor, MD  tamsulosin (FLOMAX) 0.4 MG CAPS capsule Take 0.4 mg by mouth at bedtime. 08/21/16   [provider]  traZODone (DESYREL) 100 MG tablet Take 50-100 mg by mouth at bedtime as needed for sleep.    [provider]  Dwyane Luo 200-62.5-25 MCG/INH AEPB Inhale 1 puff into the lungs daily. 01/31/21   [provider]  XARELTO 20 MG TABS tablet Take 1 tablet (20 mg total) by mouth daily with supper. 03/07/21   Rayford Halsted, PA-C     Critical care time: 23 min    Brett Canales Atavia Poppe ACNP Acute Care Nurse Practitioner [facility] Pulmonary/Critical Care Please consult Amion 06/20/2021, 10:46 AM

## 2021-06-20 NOTE — Consult Note (Signed)
CARDIOLOGY CONSULT NOTE  Patient ID: Kevin Orozco MRN: [id number] DOB/AGE: 09-30-1955 66 y.o.  Admit date: 06/19/2021 Referring Physician: Lynn Ito, MD Primary Physician:  Marletta Lor, NP Reason for Consultation: Atrial fibrillation   Patient ID: Kevin Orozco, male    DOB: October 05, 1955, 66 y.o.   MRN: [id number]  Chief Complaint  Patient presents with   Atrial Fibrillation   Back Pain   HPI:    Kevin Orozco  is a 66 y.o.  male with hypertension, paroxysmal Afib, moderate nonobstructive coronary artery disease (cath 2012), diabetes, hepatitis C, COPD, active smoker, prior h/o polysubstance abuse (EtOH, cocaine).   Patient was last seen in our office 06/13/2021 at which time he was restarted on Multaq which patient had run out of given that insurance would not cover it.  He has been on long-term anticoagulation, therefore he was scheduled for cardioversion.  However patient presented to [facility] emergency department 06/19/2021 with worsening shortness of breath.  Patient's initial EKG revealed atrial flutter with variable conduction and a ventricular rate of 85 bpm.  Patient's blood pressure was soft and troponin minimally elevated at 24 --> 30.  Given symptoms and underlying A-fib patient was cardioverted in the emergency department with successful return to normal sinus rhythm.   Evaluation in the emergency department revealed bilateral segmental and subsegmental PEs on CTA, as well as bilateral edema versus infiltrate.  His BNP is elevated at 708. Patient's symptoms of shortness of breath persisted despite cardioversion to normal sinus rhythm, he was therefore admitted for further evaluation and cardiology consulted for management of atrial fibrillation.  Patient was seen and examined at approximately 8:30 AM today.  He is maintaining normal sinus rhythm. Patient continues to have significant shortness of breath despite non-rebreather.   Past Medical History:  Diagnosis Date   Acute  respiratory distress 02/22/2015   Atrial fibrillation with RVR (HCC) 04/19/12   New onset; spontaneous conversion to NSR; Pradaxa anticoagulation   CAD (coronary artery disease)    a. Cath 2008- nonobstructive, mod LAD, mild LCx & RCA disease b. Cardiolite 2010 normal c. 03/2012 abnormal Lexiscan Myoview attributed to cocaine d. 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   Elevated LFTs in the setting of acute EtOH abuse, no acute findings on abdominal u/s, HCV+, suspected acute alcohilic heptatitis   HTN (hypertension)    Hypercholesteremia    PAF (paroxysmal atrial fibrillation) (HCC) 02/22/2015   Tobacco abuse    Past Surgical History:  Procedure Laterality Date   APPENDECTOMY     CARDIOVERSION N/A 02/21/2020   Procedure: CARDIOVERSION;  Surgeon: Yates Decamp, MD;  Location: [facility];  Service: Cardiovascular;  Laterality: N/A;   FOOT SURGERY     Family History  Problem Relation Age of Onset   Heart attack Father 91   Diabetes Mother 26   Diabetes Sister    Heart attack Sister    Social History   Tobacco Use   Smoking status: Every Day    Packs/day: 1.00    Years: 40.00    Pack years: 40.00    Types: Cigarettes   Smokeless tobacco: Never  Substance Use Topics   Alcohol use: Not Currently    Alcohol/week: 12.0 standard drinks    Types: 12 Standard drinks or equivalent per week    Marital Sttus: Divorced  ROS  Review of Systems  Constitutional: Positive for malaise/fatigue.  Cardiovascular:  Negative for chest pain, claudication, leg swelling, near-syncope, orthopnea, palpitations, paroxysmal nocturnal dyspnea and syncope.  Respiratory:  Positive for shortness of breath.   Neurological:  Positive for dizziness.   Objective   Vitals with BMI 06/20/2021 06/20/2021 06/20/2021  Height - - -  Weight - - -  BMI - - -  Systolic [id number] A999333 A999333   Diastolic 88 98 [id number]  Pulse 74 72 71    Blood pressure (!) 143/88, pulse 74, temperature 97.7 F (36.5 C), temperature source Oral, resp. rate (!) 27, weight 93 kg, SpO2 90 %.    Physical Exam Vitals reviewed.  Neck:     Vascular: No JVD.  Cardiovascular:     Rate and Rhythm: Normal rate and regular rhythm.     Pulses: Intact distal pulses.     Heart sounds: S1 normal and S2 normal. No murmur heard.   No gallop.  Pulmonary:     Effort: Accessory muscle usage and respiratory distress (moderate) present.     Breath sounds: Wheezing (bilaterally throughout) and rales present. No rhonchi.  Musculoskeletal:     Right lower leg: No edema.     Left lower leg: No edema.  Neurological:     Mental Status: He is alert.   Laboratory examination:   Recent Labs    02/23/21 1215 06/19/21 1519 06/20/21 0425  NA 136 135 137  K 4.3 3.9 4.1  CL 103 103 105  CO2 26 23 21*  GLUCOSE 119* 137* 81  BUN 14 19 21   CREATININE 1.19 0.92 0.88  CALCIUM 9.3 8.6* 8.5*  GFRNONAA >60 >60 >60   estimated creatinine clearance is 100.1 mL/min (by C-G formula based on SCr of 0.88 mg/dL).  CMP Latest Ref Rng & Units 06/20/2021 06/19/2021 02/23/2021  Glucose 70 - 99 mg/dL 81 137(H) 119(H)  BUN 8 - 23 mg/dL 21 19 14   Creatinine 0.61 - 1.24 mg/dL 0.88 0.92 1.19  Sodium 135 - 145 mmol/L 137 135 136  Potassium 3.5 - 5.1 mmol/L 4.1 3.9 4.3  Chloride 98 - 111 mmol/L 105 103 103  CO2 22 - 32 mmol/L 21(L) 23 26  Calcium 8.9 - 10.3 mg/dL 8.5(L) 8.6(L) 9.3  Total Protein 6.5 - 8.1 g/dL - - 7.4  Total Bilirubin 0.3 - 1.2 mg/dL - - 1.5(H)  Alkaline Phos 38 - 126 U/L - - 47  AST 15 - 41 U/L - - 27  ALT 0 - 44 U/L - - 24   CBC Latest Ref Rng & Units 06/20/2021 06/19/2021 02/23/2021  WBC 4.0 - 10.5 K/uL 14.3(H) 9.7 8.8  Hemoglobin 13.0 - 17.0 g/dL 13.8 12.2(L) 13.2  Hematocrit 39.0 - 52.0 % 41.5 37.2(L) 39.8  Platelets 150 - 400 K/uL 163 146(L) 147(L)   Lipid Panel No results for input(s): CHOL, TRIG, LDLCALC,  VLDL, HDL, CHOLHDL, LDLDIRECT in the last 8760 hours.  HEMOGLOBIN A1C Lab Results  Component Value Date   HGBA1C 5.4 09/09/2019   MPG 97 02/22/2015   TSH No results for input(s): TSH in the last 8760 hours. BNP (last 3 results) Recent Labs    02/23/21 1221 06/19/21 2238  BNP 510.6* 708.0*   Results for orders placed or performed during the hospital encounter of 06/19/21 (from the past 48 hour(s))  Basic metabolic panel     Status: Abnormal   Collection Time: 06/19/21  3:19 PM  Result Value Ref Range   Sodium 135 135 -  145 mmol/L   Potassium 3.9 3.5 - 5.1 mmol/L   Chloride 103 98 - 111 mmol/L   CO2 23 22 - 32 mmol/L   Glucose, Bld 137 (H) 70 - 99 mg/dL    Comment: Glucose reference range applies only to samples taken after fasting for at least 8 hours.   BUN 19 8 - 23 mg/dL   Creatinine, Ser 0.92 0.61 - 1.24 mg/dL   Calcium 8.6 (L) 8.9 - 10.3 mg/dL   GFR, Estimated >60 >60 mL/min    Comment: (NOTE) Calculated using the CKD-EPI Creatinine Equation (2021)    Anion gap 9 5 - 15    Comment: Performed at [facility] [address] [place], [place] [address]  CBC     Status: Abnormal   Collection Time: 06/19/21  3:19 PM  Result Value Ref Range   WBC 9.7 4.0 - 10.5 K/uL   RBC 3.45 (L) 4.22 - 5.81 MIL/uL   Hemoglobin 12.2 (L) 13.0 - 17.0 g/dL   HCT 37.2 (L) 39.0 - 52.0 %   MCV 107.8 (H) 80.0 - 100.0 fL   MCH 35.4 (H) 26.0 - 34.0 pg   MCHC 32.8 30.0 - 36.0 g/dL   RDW 14.6 11.5 - 15.5 %   Platelets 146 (L) 150 - 400 K/uL   nRBC 0.0 0.0 - 0.2 %    Comment: Performed at [facility] [address] [place], [place] [address]  Troponin I (High Sensitivity)     Status: Abnormal   Collection Time: 06/19/21  3:19 PM  Result Value Ref Range   Troponin I (High Sensitivity) 24 (H) <18 ng/L    Comment: (NOTE) Elevated high sensitivity troponin I (hsTnI) values and significant  changes across serial measurements may suggest ACS but many other  chronic and acute  conditions are known to elevate hsTnI results.  Refer to the "Links" section for chest pain algorithms and additional  guidance. Performed at [facility], [place] [address], [place], [place] [address]   Troponin I (High Sensitivity)     Status: Abnormal   Collection Time: 06/19/21  5:11 PM  Result Value Ref Range   Troponin I (High Sensitivity) 30 (H) <18 ng/L    Comment: (NOTE) Elevated high sensitivity troponin I (hsTnI) values and significant  changes across serial measurements may suggest ACS but many other  chronic and acute conditions are known to elevate hsTnI results.  Refer to the "Links" section for chest pain algorithms and additional  guidance. Performed at [facility], [place] [address], [place], [place] [address]   Magnesium     Status: None   Collection Time: 06/19/21  5:11 PM  Result Value Ref Range   Magnesium 2.1 1.7 - 2.4 mg/dL    Comment: Performed at [facility] [address], [place], [place] [address]  Resp Panel by RT-PCR (Flu A&B, Covid) Nasopharyngeal Swab     Status: None   Collection Time: 06/19/21 10:29 PM   Specimen: Nasopharyngeal Swab; Nasopharyngeal(NP) swabs in vial transport medium  Result Value Ref Range   SARS Coronavirus 2 by RT PCR NEGATIVE NEGATIVE    Comment: (NOTE) SARS-CoV-2 target nucleic acids are NOT DETECTED.  The SARS-CoV-2 RNA is generally detectable in upper respiratory specimens during the acute phase of infection. The lowest concentration of SARS-CoV-2 viral copies this assay can detect is 138 copies/mL. A negative result does not preclude SARS-Cov-2 infection and should not be used as the  sole basis for treatment or other patient management decisions. A negative result may occur with  improper specimen collection/handling, submission of specimen other than nasopharyngeal swab, presence of viral mutation(s) within the areas targeted by this assay, and inadequate number of viral copies(<138 copies/mL). A  negative result must be combined with clinical observations, patient history, and epidemiological information. The expected result is Negative.  Fact Sheet for Patients:  EntrepreneurPulse.com.au  Fact Sheet for Healthcare Providers:  IncredibleEmployment.be  This test is no t yet approved or cleared by the [place] FDA and  has been authorized for detection and/or diagnosis of SARS-CoV-2 by FDA under an Emergency Use Authorization (EUA). This EUA will remain  in effect (meaning this test can be used) for the duration of the COVID-19 declaration under Section 564(b)(1) of the Act, 21 U.S.C.section 360bbb-3(b)(1), unless the authorization is terminated  or revoked sooner.       Influenza A by PCR NEGATIVE NEGATIVE   Influenza B by PCR NEGATIVE NEGATIVE    Comment: (NOTE) The Xpert Xpress SARS-CoV-2/FLU/RSV plus assay is intended as an aid in the diagnosis of influenza from Nasopharyngeal swab specimens and should not be used as a sole basis for treatment. Nasal washings and aspirates are unacceptable for Xpert Xpress SARS-CoV-2/FLU/RSV testing.  Fact Sheet for Patients: EntrepreneurPulse.com.au  Fact Sheet for Healthcare Providers: IncredibleEmployment.be  This test is not yet approved or cleared by the [place] FDA and has been authorized for detection and/or diagnosis of SARS-CoV-2 by FDA under an Emergency Use Authorization (EUA). This EUA will remain in effect (meaning this test can be used) for the duration of the COVID-19 declaration under Section 564(b)(1) of the Act, 21 U.S.C. section 360bbb-3(b)(1), unless the authorization is terminated or revoked.  Performed at [facility], [place] [address], [place], [place] [address]   Brain natriuretic peptide     Status: Abnormal   Collection Time: 06/19/21 10:38 PM  Result Value Ref Range   B Natriuretic Peptide 708.0 (H) 0.0 - 100.0  pg/mL    Comment: Performed at [facility] [address], [place], [place] [address]  HIV Antibody (routine testing w rflx)     Status: None   Collection Time: 06/19/21 11:05 PM  Result Value Ref Range   HIV Screen 4th Generation wRfx Non Reactive Non Reactive    Comment: Performed at [facility], [place] [address], [place], [place] [address]  Heparin level (unfractionated)     Status: Abnormal   Collection Time: 06/20/21  4:25 AM  Result Value Ref Range   Heparin Unfractionated >1.10 (H) 0.30 - 0.70 IU/mL    Comment: (NOTE) The clinical reportable range upper limit is being lowered to >1.10 to align with the FDA approved guidance for the current laboratory assay.  If heparin results are below expected values, and patient dosage has  been confirmed, suggest follow up testing of antithrombin III levels. Performed at [facility], [place] [address], [place], [place] [address]   APTT     Status: Abnormal   Collection Time: 06/20/21  4:25 AM  Result Value Ref Range   aPTT 56 (H) 24 - 36 seconds    Comment:        IF BASELINE aPTT IS ELEVATED, SUGGEST PATIENT RISK ASSESSMENT BE USED TO DETERMINE APPROPRIATE ANTICOAGULANT THERAPY. Performed at [facility], [place] [address] [place], [place] [address]   CBC     Status: Abnormal   Collection Time: 06/20/21  4:25 AM  Result Value Ref Range   WBC 14.3 (H) 4.0 - 10.5 K/uL   RBC 3.86 (L) 4.22 - 5.81 MIL/uL   Hemoglobin 13.8 13.0 - 17.0 g/dL   HCT 41.5 39.0 - 52.0 %   MCV 107.5 (H) 80.0 - 100.0 fL   MCH 35.8 (H) 26.0 - 34.0 pg   MCHC 33.3 30.0 - 36.0 g/dL   RDW 14.6 11.5 - 15.5 %   Platelets 163 150 - 400 K/uL   nRBC 0.1 0.0 - 0.2 %    Comment: Performed at [facility] [address], [place], [place] [id number]  Basic metabolic panel     Status: Abnormal   Collection Time: 06/20/21  4:25 AM  Result Value Ref Range   Sodium 137 135 - 145 mmol/L   Potassium 4.1 3.5 - 5.1 mmol/L   Chloride 105 98 - 111  mmol/L   CO2 21 (L) 22 - 32 mmol/L   Glucose, Bld 81 70 - 99 mg/dL    Comment: Glucose reference range applies only to samples taken after fasting for at least 8 hours.   BUN 21 8 - 23 mg/dL   Creatinine, Ser 0.88 0.61 - 1.24 mg/dL   Calcium 8.5 (L) 8.9 - 10.3 mg/dL   GFR, Estimated >60 >60 mL/min    Comment: (NOTE) Calculated using the CKD-EPI Creatinine Equation (2021)    Anion gap 11 5 - 15    Comment: Performed at [facility] [address] [place], [place] [address]  CBG monitoring, ED     Status: None   Collection Time: 06/20/21  7:35 AM  Result Value Ref Range   Glucose-Capillary 90 70 - 99 mg/dL    Comment: Glucose reference range applies only to samples taken after fasting for at least 8 hours.    Medications and allergies   Allergies  Allergen Reactions   Etomidate Anxiety   Tape Other (See Comments)    SKIN WILL TEAR EASILY!!!!     No outpatient medications have been marked as taking for the 06/19/21 encounter [facility] Encounter).    Scheduled Meds:  diltiazem  120 mg Oral Daily   fluticasone furoate-vilanterol  1 puff Inhalation Daily   And   umeclidinium bromide  1 puff Inhalation Daily   gabapentin  300 mg Oral QHS   insulin aspart  0-9 Units Subcutaneous TID WC   isosorbide mononitrate  30 mg Oral Daily   metoprolol  200 mg Oral q AM   morphine  30 mg Oral Q12H   pantoprazole  40 mg Oral QAC breakfast   predniSONE  10 mg Oral Q breakfast   ranolazine  500 mg Oral BID   rosuvastatin  10 mg Oral Daily   sodium chloride flush  3 mL Intravenous Q12H   spironolactone  25 mg Oral Daily   Continuous Infusions:  heparin 1,700 Units/hr (06/20/21 0618)   PRN Meds:.acetaminophen **OR** acetaminophen, clonazePAM, naLOXone (NARCAN)  injection, oxyCODONE-acetaminophen **AND** oxyCODONE, traZODone   No intake/output data recorded. No intake/output data recorded.    Radiology:   CT angio chest PE 06/19/2021: 1. Bilateral segmental and subsegmental  pulmonary emboli. No evidence of right heart strain. 2. Distended pulmonary trunk which may be associated with underlying pulmonary artery hypertension. Reflux of contrast into the inferior vena cava and hepatic veins may be associated with right heart failure. 3. Hazy attenuation of the lungs bilaterally with patchy airspace disease in the lower lobes, possible edema, atelectasis, and/or  infiltrate. 4. Small bilateral pleural effusions. 5. Aortic atherosclerosis with aneurysmal dilatation of the ascending aorta measuring up to 4.1 cm. Recommend annual imaging followup by CTA or MRA. This recommendation follows 2010 ACCF/AHA/AATS/ACR/ASA/SCA/SCAI/SIR/STS/SVM Guidelines for the Diagnosis and Management of Patients with Thoracic Aortic Disease. Circulation. 2010; [id number]. Aortic aneurysm NOS (ICD10-I71.9) 6. Cardiomegaly with coronary artery calcifications. 7. Slightly nodular contour of the liver, suggesting underlying cirrhosis. 8. Left renal cysts.  Chest x-ray 06/19/2021: Stable cardiomediastinal silhouette. Increased bibasilar atelectasis or edema is noted with small bilateral pleural effusions. Bony thorax is unremarkable. IMPRESSION: Increased bibasilar atelectasis or edema is noted with small bilateral pleural effusions.  Cardiac Studies:   Direct-current cardioversion in the ED 02/23/2021 Successful return to normal sinus rhythm from atrial fibrillation with RVR.   PCV ECHOCARDIOGRAM COMPLETE 02/13/2021 Left ventricle cavity is normal in size. Moderate concentric hypertrophy of the left ventricle. Normal global wall motion. Normal LV systolic function with visual EF 50-55%. Diastolic function not assessed due to atrial fibrillation. Left atrial cavity is mildly dilated. Trileaflet aortic valve. Moderate aortic valve leaflet calcification. Mild (Grade I) aortic regurgitation. Mild mitral valve leaflet calcification. Mildly restricted mitral valve leaflets without  significant stenosis. Moderate (Grade III) mitral regurgitation. Mild to moderate tricuspid regurgitation. Estimated pulmonary artery systolic pressure 29 mmHg. Mild to moderate pulmonic regurgitation. No significant change compared to previous study in 2021  Coronary angiography 2012: LM: Normal LAD: Ostial 40-50% stenosis, mid LAD 30% stenosis LCx: OM2 30% stenosis RCA: Mid 20% stenosis and posterior lateral branch  EKG: 06/19/2021 14: 41: Atrial flutter with variable conduction at a rate of 85 bpm.  Normal axis.  LVH with secondary repolarization abnormality.  06/19/2021 20: 42: Normal sinus rhythm at a rate of 76 bpm.  Telemetry: NSR  Assessment   Dequarius Placzek  is a 66 y.o. male with hypertension, paroxysmal Afib, moderate nonobstructive coronary artery disease (cath 2012), diabetes, hepatitis C, COPD, active smoker, prior h/o polysubstance abuse (EtOH, cocaine).  Presenting with shortness of breath and found to have bilateral segmental and subsegmental PEs.  Patient also presented in atrial fibrillation and was subsequently cardioverted in the emergency department.  Atrial fibrillation/flutter Acute pulmonary embolism CAD with stable angina HFpEF Hypertension Hyperlipidemia COPD Diabetes  Recommendations:   Atrial fibrillation/flutter This patients CHA2DS2-VASc Score 3 (DM, HF, vasc) and yearly risk of stroke 3.2%.  Patient is on Xarelto outpatient and reports that he takes it daily, although he admits he does not consistently take it at the same time daily.  Patient is presently on IV heparin given PE. Agree with hematology consult for further recommendations regarding anticoagulation.  Continue diltiazem Will stop metoprolol succinate as patient is now in normal sinus and he does have bilateral wheezing on exam. Patient is on Multaq outpatient, can consider restarting pending results of echocardiogram    Acute on chronic HFpEF BNP 708 Patient is fairly euvolemic on  exam.  Will obtain repeat echocardiogram.  Continue spironolactone, isosorbide mononitrate Stop metoprolol succinate as above Start Lasix 40 mg IV twice daily Patient's troponin minimally elevated and essentially flat, not consistent with ACS  Acute respiratory failure Likely multifactorial including acute on chronic heart failure as well as underlying COPD exacerbation, bilateral PE, and ongoing tobacco use At time of exam this morning recommended patient be placed on BiPAP which he had previously refused but was willing to try after discussion. Requested critical care consultation for further management. Notably discussed CODE STATUS, patient wishes to be full code.  Acute pulmonary embolism  Presently on IV heparin, although he is on Xarelto given underlying A-fib on outpatient basis. Agree with hematology consult We will defer further management to primary team.  CAD with stable angina Continue Imdur, Ranexa, and statin therapy Stop metoprolol succinate as above   Hypertension Blood pressure is within acceptable limits. Continue Imdur, spironolactone, diltiazem Stop metoprolol succinate as above  Hyperlipidemia Continue statin therapy  COPD Management per primary team Suspected COPD exacerbation is contributing to acute respiratory failure  Diabetes Management per primary team   Patient was seen in collaboration with Dr. Virgina Jock. He also reviewed patient's chart and examined the patient. Dr. Virgina Jock is in agreement of the plan.    Kevin Berthold, PA-C 06/20/2021, 8:04 AM Office: [phone]

## 2021-06-20 NOTE — TOC Progression Note (Signed)
Transition of Care (TOC) - Progression Note  ? ? ?Patient Details  ?Name: Kevin Orozco ?MRN: [id number] ?Date of Birth: 10-18-1955 ? ?Transition of Care (TOC) CM/SW Contact  ?Zenon Mayo, RN ?Phone Number: ?06/20/2021, 4:50 PM ? ?Clinical Narrative:    ?From home found to have PE, and sob, conts on heparind drip and iv lasix, TOC will continue to follow for dc needs.   ? ? ?  ?  ? ?Expected Discharge Plan and Services ?  ?  ?  ?  ?  ?                ?  ?  ?  ?  ?  ?  ?  ?  ?  ?  ? ? ?Social Determinants of Health (SDOH) Interventions ?  ? ?Readmission Risk Interventions ?No flowsheet data found. ? ?

## 2021-06-20 NOTE — Progress Notes (Signed)
Patient declining to have a new IV placed to receive IV lasix.  Patient had large amounts of bleeding from heparin infusion at previous IV site.  Patient does not want another IV until he feels confident that he will not start bleeding again, possibly in the morning.  MD notified and orders placed for PO lasix 40mg  to which patient is agreeable in taking.  ?

## 2021-06-20 NOTE — ED Notes (Addendum)
Pt oxygen saturation noted to be trending downward during sleep. Oxygen delivery increased from 4L [place] to 6L [place] with no improvement (remaining 86-88%). Pt then placed on [place] and oxygen titrated up to 12 LPM without improvement in saturation. Pt not in distress at this time. Respiratory therapy called.  ?

## 2021-06-20 NOTE — Progress Notes (Signed)
65 y/o Caucasian male with hypertension, paroxysmal Afib, moderate nonobstructive coronary artery disease (cath 2012), COPD, active smoker, prior h/o polysubstance abuse (EtOH, cocaine), admitted with acute on chronic hypoxic respiratory failure, Afib w/RVR. ? ?He underwent cardioversion in RT on 06/20/2021, but symptoms have persisted. Further workup showed CTA chest with subsegmental PE without RV strain. CXR had bilateral atelectasis or edema and small pleural effusions.  ?  ?This morning, patient is in moderate respiratory distress, with O2 sat around 89-90% on 15 L oxygen.  Patient sitting up with increased accessory muscle use.  No edema or significant JVD on exam.  Has bilateral wheezing. ? ?Acute hypoxic respiratory failure, likely multifactorial with COPD exacerbation, acute on chronic HFpEF, subsegmental PE. Patient is at risk for further decompensation.  We will continue IV Lasix, and COPD management. Continue Xarelto for A-fib as well as PE.  He is currently on IV heparin, and not on Xarelto. ? ?I recommend BiPAP use.  He is now agreeable to using BiPAP.  Without this, I am concerned that he may progress to requiring intubation and mechanical ventilation.  Respiratory therapist was contacted for the same. ? ?I will update primary team. ? ?CRITICAL CARE ?Performed by: Truett Mainland ?  ?Total critical care time: 30 minutes ?  ?Critical care time was exclusive of separately billable procedures and treating other patients. ?  ?Critical care was necessary to treat or prevent imminent or life-threatening deterioration. ?  ?Critical care was time spent personally by me on the following activities: development of treatment plan with patient and/or surrogate as well as nursing, discussions with consultants, evaluation of patient's response to treatment, examination of patient, obtaining history from patient or surrogate, ordering and performing treatments and interventions, ordering and review of laboratory  studies, ordering and review of radiographic studies, pulse oximetry and re-evaluation of patient's condition. ?  ? ?

## 2021-06-20 NOTE — Progress Notes (Signed)
Pt is refusing CPAP and don't want to be disturbed. ?

## 2021-06-20 NOTE — Progress Notes (Signed)
Pt oxygen saturation is 85% to 88% while asleep on 12L [place]. Pt has a history of COPD and may have OSA. Pt stated that he will not wear CPAP mask and wanted to be left alone. RT informed RN of pt refusal. RN will notify RT if sats continue to decrease.  ?

## 2021-06-20 NOTE — Progress Notes (Signed)
ANTICOAGULATION CONSULT NOTE  ? ?Pharmacy Consult for heparin ?Indication: pulmonary embolus ? ?Allergies  ?Allergen Reactions  ? Etomidate Anxiety  ? Tape Other (See Comments)  ?  SKIN WILL TEAR EASILY!!!!  ? ? ?Patient Measurements: ?Weight: 93 kg (205 lb) ?Heparin Dosing Weight: TBW ? ?Vital Signs: ?Temp Source: Oral (02/28 2205) ?BP: 160/139 (03/01 0400) ?Pulse Rate: 87 (03/01 0400) ? ?Labs: ?Recent Labs  ?  06/19/21 ?1519 06/19/21 ?1711 06/20/21 ?0425  ?HGB 12.2*  --  13.8  ?HCT 37.2*  --  41.5  ?PLT 146*  --  163  ?APTT  --   --  56*  ?HEPARINUNFRC  --   --  >1.10*  ?CREATININE 0.92  --  0.88  ?TROPONINIHS 24* 30*  --   ? ? ? ?Estimated Creatinine Clearance: 100.1 mL/min (by C-G formula based on SCr of 0.88 mg/dL). ? ? ?Medical History: ?Past Medical History:  ?Diagnosis Date  ? Acute respiratory distress 02/22/2015  ? Atrial fibrillation with RVR (HCC) 04/19/12  ? New onset; spontaneous conversion to NSR; Pradaxa anticoagulation  ? CAD (coronary artery disease)   ? a. Cath 2008- nonobstructive, mod LAD, mild LCx & RCA disease b. Cardiolite 2010 normal c. 03/2012 abnormal Lexiscan Myoview attributed to cocaine d. 03/2012 echo: EF 55-60%, moderate LVH, moderate LA dilatation  ? Cocaine abuse (HCC)   ? DM type 2 (diabetes mellitus, type 2) (HCC)   ? ETOH abuse   ? 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 Elevated LFTs in the setting of acute EtOH abuse, no acute findings on abdominal u/s, HCV+, suspected acute alcohilic heptatitis  ? HTN (hypertension)   ? Hypercholesteremia   ? PAF (paroxysmal atrial fibrillation) (HCC) 02/22/2015  ? Tobacco abuse   ? ? ?Assessment: ?45 YOM presenting with SOB, hx of afib on Xarelto PTA (last dose 2/27 ~1700), CT angio showing bilateral segmental/subsegmental PE without RHS however distended pulmonary trunk and contrast reflux VC/hepatic veins - given this and last dose >24h ago will add bolus ? ?3/1 AM update:  ?aPTT below goal ? ?Goal of Therapy:  ?Heparin level 0.3-0.7 units/ml ?aPTT  66-102 seconds ?Monitor platelets by anticoagulation protocol: Yes ?  ?Plan:  ?Inc heparin to 1700 units/hr ?1400 aPTT and heparin level ? ?Abran Duke, PharmD, BCPS ?Clinical Pharmacist ?Phone: [phone] ? ? ? ?

## 2021-06-20 NOTE — ED Notes (Signed)
Pt pulled off BiPap mask to eat. This RN called respiratory who instructed me to put machine on Bipap. Pt appears to be tolerating high flow [place] well while eating  ?

## 2021-06-20 NOTE — Progress Notes (Signed)
?  X-cover Note: ?RN reports pt has lost IV access. Refusing to have another IV placed. Cannot give IV lasix. Will give 1 dose of po lasix tonight. ? ? ?Carollee Herter, DO ?[facility] ? ?

## 2021-06-20 NOTE — Consult Note (Addendum)
[facility]  [phone] [phone]   MEDICAL ONCOLOGY - INITIAL CONSULTATION    Referral MD  Reason for Referral: PE while on anticoagulation  Chief Complaint  Patient presents with   Atrial Fibrillation   Back Pain    HPI:   This is a 66 year old male patient with hypertension, paroxysmal atrial fibrillation, COPD, active smoker, admitted with acute on chronic hypoxic respiratory failure, A-fib with RVR.  He was attempted cardioversion in emergency room however continued to have shortness of breath and was admitted for further investigation.  He had CT imaging which showed bilateral subsegmental PE without RV strain.  At the home, he is on anticoagulation with Xarelto and he denies any noncompliance. Hematology was consulted because of concern for anticoagulation failure.  He is currently on heparin drip. He was upset that we are not letting him sleep. He says he has been doing everything like it was asked, has not used alcohol or cocaine in years He remembers having liver disease and ascites again, couldn't tell me when exactly No blood in his stool or urine. Smoked 1PPD maximum, smokes much less now Rest of the pertinent 10 point ROS reviewed and negative.   Past Medical History:  Diagnosis Date   Acute respiratory distress 02/22/2015   Atrial fibrillation with RVR (HCC) 04/19/12   New onset; spontaneous conversion to NSR; Pradaxa anticoagulation   CAD (coronary artery disease)    a. Cath 2008- nonobstructive, mod LAD, mild LCx & RCA disease b. Cardiolite 2010 normal c. 03/2012 abnormal Lexiscan Myoview attributed to cocaine d. 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   Elevated LFTs in the setting of acute EtOH abuse, no acute findings on abdominal u/s, HCV+, suspected acute alcohilic heptatitis    HTN (hypertension)    Hypercholesteremia    PAF (paroxysmal atrial fibrillation) (HCC) 02/22/2015   Tobacco abuse   :   Past Surgical History:  Procedure Laterality Date   APPENDECTOMY     CARDIOVERSION N/A 02/21/2020   Procedure: CARDIOVERSION;  Surgeon: Yates Decamp, MD;  Location: [facility] ENDOSCOPY;  Service: Cardiovascular;  Laterality: N/A;   FOOT SURGERY    :   Current Facility-Administered Medications  Medication Dose Route Frequency Provider Last Rate Last Admin   acetaminophen (TYLENOL) tablet 650 mg  650 mg Oral Q6H PRN Synetta Fail, MD       Or   acetaminophen (TYLENOL) suppository 650 mg  650 mg Rectal Q6H PRN Synetta Fail, MD       clonazePAM Scarlette Calico) tablet 0.5-1 mg  0.5-1 mg Oral TID PRN Synetta Fail, MD   0.5 mg at 06/20/21 0141   diltiazem (CARDIZEM CD) 24 hr capsule 120 mg  120 mg Oral Daily Synetta Fail, MD       fluticasone furoate-vilanterol (BREO ELLIPTA) 200-25 MCG/ACT 1 puff  1 puff Inhalation Daily Synetta Fail, MD   1 puff at 06/20/21 3244   And   umeclidinium bromide (INCRUSE ELLIPTA) 62.5 MCG/ACT 1 puff  1 puff Inhalation Daily Synetta Fail, MD   1 puff at 06/20/21 0827   furosemide (LASIX) injection 40 mg  40 mg Intravenous BID [name], [name], MD       gabapentin (NEURONTIN) capsule 300 mg  300 mg Oral QHS Synetta Fail, MD  300 mg at 06/20/21 0141   heparin ADULT infusion 100 units/mL (25000 units/232mL)  1,700 Units/hr Intravenous Continuous Stevphen Rochester, RPH 17 mL/hr at 06/20/21 0618 1,700 Units/hr at 06/20/21 0618   insulin aspart (novoLOG) injection 0-9 Units  0-9 Units Subcutaneous TID WC Synetta Fail, MD       isosorbide mononitrate (IMDUR) 24 hr tablet 30 mg  30 mg Oral Daily Synetta Fail, MD       metoprolol succinate (TOPROL-XL) 24 hr tablet 200 mg  200 mg Oral q AM Synetta Fail, MD   200 mg at 06/20/21 0746   morphine (MS CONTIN) 12 hr tablet 30 mg  30 mg Oral Q12H Synetta Fail, MD   30 mg at 06/20/21 0747   naloxone [facility]) injection 0.4 mg  0.4 mg Intravenous PRN Synetta Fail, MD       oxyCODONE-acetaminophen (PERCOCET/ROXICET) 5-325 MG per tablet 1 tablet  1 tablet Oral Q4H PRN Synetta Fail, MD   1 tablet at 06/20/21 5956   And   oxyCODONE (Oxy IR/ROXICODONE) immediate release tablet 5 mg  5 mg Oral Q4H PRN Synetta Fail, MD   5 mg at 06/20/21 0829   pantoprazole (PROTONIX) EC tablet 40 mg  40 mg Oral QAC breakfast Synetta Fail, MD   40 mg at 06/20/21 0747   predniSONE (DELTASONE) tablet 10 mg  10 mg Oral Q breakfast Synetta Fail, MD   10 mg at 06/20/21 0747   ranolazine (RANEXA) 12 hr tablet 500 mg  500 mg Oral BID Synetta Fail, MD   500 mg at 06/20/21 0011   rosuvastatin (CRESTOR) tablet 10 mg  10 mg Oral Daily Synetta Fail, MD       sodium chloride flush (NS) 0.9 % injection 3 mL  3 mL Intravenous Q12H Synetta Fail, MD   3 mL at 06/19/21 2324   spironolactone (ALDACTONE) tablet 25 mg  25 mg Oral Daily Synetta Fail, MD       traZODone (DESYREL) tablet 50-100 mg  50-100 mg Oral QHS PRN Synetta Fail, MD   50 mg at 06/20/21 3875   Current Outpatient Medications  Medication Sig Dispense Refill   clonazePAM (KLONOPIN) 0.5 MG tablet Take 0.5-1 mg by mouth See admin instructions. Take 1 mg by mouth at bedtime and an additional 0.5-1 mg in the morning as needed for anxiety     diclofenac sodium (VOLTAREN) 1 % GEL Apply 2 g topically 4 (four) times daily as needed (to painful sites).     diltiazem (CARDIZEM CD) 120 MG 24 hr capsule Take 1 capsule (120 mg total) by mouth daily. 90 capsule 3   finasteride (PROSCAR) 5 MG tablet Take 5 mg by mouth at bedtime.     furosemide (LASIX) 40 MG tablet Take 1 tablet (40 mg total) by mouth daily as needed. For fluid weight gain- (Patient taking differently: Take 40 mg by mouth in the morning.) 30 tablet 0   gabapentin (NEURONTIN) 300 MG capsule Take 300 mg  by mouth at bedtime.     ipratropium-albuterol (DUONEB) 0.5-2.5 (3) MG/3ML SOLN Take 3 mLs by nebulization 4 (four) times daily as needed (for shortness of breath or wheezing).     isosorbide mononitrate (IMDUR) 30 MG 24 hr tablet Take 1 tablet (30 mg total) by mouth daily. 30 tablet 0   lubiprostone (AMITIZA) 8 MCG capsule Take 8 mcg by mouth daily with breakfast.  metoprolol (TOPROL-XL) 200 MG 24 hr tablet Take 1 tablet (200 mg total) by mouth in the morning. 7 tablet 0   morphine (MS CONTIN) 30 MG 12 hr tablet Take 30 mg by mouth See admin instructions. Take 30 mg by mouth at 8 AM and 8 PM     MOVANTIK 12.5 MG TABS tablet Take 12.5 mg by mouth every morning.     Naloxone HCl (KLOXXADO) 8 MG/0.1ML LIQD Place into the nose as needed (for accidental overdose).     nitroGLYCERIN (NITROSTAT) 0.4 MG SL tablet Place under the tongue every 5 (five) minutes as needed for chest pain.     oxyCODONE-acetaminophen (PERCOCET) 10-325 MG tablet Take 1 tablet by mouth every 4 (four) hours as needed.     pantoprazole (PROTONIX) 40 MG tablet Take 40 mg by mouth daily before breakfast.     predniSONE (DELTASONE) 10 MG tablet Take 10 mg by mouth daily with breakfast.     ranolazine (RANEXA) 500 MG 12 hr tablet TAKE ONE TABLET BY MOUTH TWICE DAILY (Patient taking differently: Take 500 mg by mouth in the morning and at bedtime.) 60 tablet 6   rosuvastatin (CRESTOR) 10 MG tablet Take 1 tablet (10 mg total) by mouth daily. 90 tablet 1   sertraline (ZOLOFT) 50 MG tablet Take 50 mg by mouth daily.     spironolactone (ALDACTONE) 25 MG tablet Take 1 tablet (25 mg total) by mouth daily. 30 tablet 0   tamsulosin (FLOMAX) 0.4 MG CAPS capsule Take 0.4 mg by mouth at bedtime.  0   traZODone (DESYREL) 100 MG tablet Take 50-100 mg by mouth at bedtime as needed for sleep.     TRELEGY ELLIPTA 200-62.5-25 MCG/INH AEPB Inhale 1 puff into the lungs daily.     XARELTO 20 MG TABS tablet Take 1 tablet (20 mg total) by mouth daily with  supper. 90 tablet 1      Allergies  Allergen Reactions   Etomidate Anxiety   Tape Other (See Comments)    SKIN WILL TEAR EASILY!!!!  :   Family History  Problem Relation Age of Onset   Heart attack Father 2   Diabetes Mother 51   Diabetes Sister    Heart attack Sister   :   Social History   Socioeconomic History   Marital status: Divorced    Spouse name: Not on file   Number of children: 1   Years of education: Not on file   Highest education level: Not on file  Occupational History   Occupation: unemployed  Tobacco Use   Smoking status: Every Day    Packs/day: 1.00    Years: 40.00    Pack years: 40.00    Types: Cigarettes   Smokeless tobacco: Never  Vaping Use   Vaping Use: Never used  Substance and Sexual Activity   Alcohol use: Not Currently    Alcohol/week: 12.0 standard drinks    Types: 12 Standard drinks or equivalent per week   Drug use: Yes    Types: Cocaine, Morphine, Oxycodone    Comment: Currently on Hydrocodone and Morphine, not Cocaine   Sexual activity: Not Currently  Other Topics Concern   Not on file  Social History Narrative   Unemployed.  Lives alone. Divorced.   Social Determinants of Health   Financial Resource Strain: Not on file  Food Insecurity: Not on file  Transportation Needs: Not on file  Physical Activity: Not on file  Stress: Not on file  Social Connections: Not  on file  Intimate Partner Violence: Not on file    Exam: Patient Vitals for the past 24 hrs:  BP Temp Temp src Pulse Resp SpO2 Weight  06/20/21 0915 (!) 146/86 -- -- 73 (!) 27 92 % --  06/20/21 0850 -- -- -- 83 (!) 29 92 % --  06/20/21 0730 (!) 143/88 -- -- 74 (!) 27 90 % --  06/20/21 0700 (!) 131/98 -- -- 72 (!) 25 (!) 86 % --  06/20/21 0600 (!) 143/104 -- -- 71 (!) 26 (!) 86 % --  06/20/21 0400 (!) 160/139 -- -- 87 (!) 22 (!) 89 % --  06/20/21 0345 123/79 -- -- 75 (!) 23 (!) 89 % --  06/20/21 0307 -- -- -- 70 (!) 23 (!) 87 % --  06/20/21 0306 -- -- --  70 (!) 26 (!) 89 % --  06/20/21 0251 -- -- -- 69 (!) 24 (!) 88 % --  06/20/21 0245 115/72 -- -- 67 (!) 23 90 % --  06/20/21 0230 109/71 -- -- 66 (!) 24 (!) 88 % --  06/20/21 0215 108/67 -- -- 64 (!) 23 (!) 87 % --  06/20/21 0200 111/69 -- -- 64 (!) 22 (!) 88 % --  06/20/21 0145 110/69 -- -- 66 (!) 22 (!) 89 % --  06/20/21 0115 122/76 -- -- 62 18 92 % --  06/20/21 0100 131/83 -- -- (!) 59 (!) 22 92 % --  06/20/21 0015 134/83 -- -- 72 (!) 28 100 % --  06/20/21 0000 -- -- -- 82 (!) 28 (!) 88 % --  06/19/21 2245 104/74 -- -- 65 (!) 25 94 % --  06/19/21 2215 99/70 -- -- 74 (!) 21 92 % 205 lb (93 kg)  06/19/21 2206 -- -- -- 71 (!) 24 93 % --  06/19/21 2205 (!) 150/103 -- Oral 72 (!) 27 93 % --  06/19/21 2130 104/78 -- -- 64 19 91 % --  06/19/21 2125 (!) 89/70 -- -- 64 (!) 23 93 % --  06/19/21 2123 (!) 87/67 -- -- 64 (!) 23 93 % --  06/19/21 2115 (!) 87/68 -- -- 64 20 97 % --  06/19/21 2100 93/72 -- -- 61 (!) 25 96 % --  06/19/21 2048 123/85 -- -- 74 (!) 26 90 % --  06/19/21 2045 (!) 160/106 -- -- 78 (!) 23 91 % --  06/19/21 2044 (!) 156/137 -- -- 79 (!) 23 94 % --  06/19/21 2040 -- -- -- 65 (!) 28 93 % --  06/19/21 2039 125/65 -- -- (!) 52 (!) 23 97 % --  06/19/21 2036 (!) 103/91 -- -- 88 (!) 21 98 % --  06/19/21 2030 116/82 -- -- (!) 198 18 94 % --  06/19/21 2000 103/79 -- -- (!) 111 19 96 % --  06/19/21 1915 115/78 -- -- 77 18 95 % --  06/19/21 1845 94/81 -- -- (!) 107 16 96 % --  06/19/21 1830 90/63 -- -- 90 19 95 % --  06/19/21 1815 109/81 -- -- 69 14 95 % --  06/19/21 1800 111/85 -- -- 72 17 95 % --  06/19/21 1745 94/78 -- -- (!) 54 19 97 % --  06/19/21 1718 107/76 -- -- 80 19 97 % --  06/19/21 1630 (!) 119/107 -- -- (!) 102 (!) 23 97 % --  06/19/21 1609 98/72 -- -- (!) 32 20 97 % --  06/19/21 1442 95/64 97.7 F (36.5  C) Oral 89 20 96 % --   Physical Exam Constitutional:      General: He is in acute distress (mild resp distress).  Pulmonary:     Effort: Pulmonary effort is  normal.     Breath sounds: Normal breath sounds. No wheezing or rales.     Comments: Only ant lung fields examined, patient upset about having to wake up Abdominal:     General: Abdomen is flat. There is distension.     Palpations: Abdomen is soft.  Musculoskeletal:        General: No swelling or tenderness.  Skin:    General: Skin is warm and dry.     Findings: Bruising present.  Neurological:     Mental Status: He is alert.  Psychiatric:        Mood and Affect: Mood normal.       Lab Results  Component Value Date   WBC 14.3 (H) 06/20/2021   HGB 13.8 06/20/2021   HCT 41.5 06/20/2021   PLT 163 06/20/2021   GLUCOSE 81 06/20/2021   CHOL 125 04/20/2012   TRIG 100 04/20/2012   HDL 36 (L) 04/20/2012   LDLCALC 69 04/20/2012   ALT 24 02/23/2021   AST 27 02/23/2021   NA 137 06/20/2021   K 4.1 06/20/2021   CL 105 06/20/2021   CREATININE 0.88 06/20/2021   BUN 21 06/20/2021   CO2 21 (L) 06/20/2021    DG Chest 2 View  Result Date: 06/19/2021 CLINICAL DATA:  Shortness of breath. EXAM: CHEST - 2 VIEW COMPARISON:  February 23, 2021. FINDINGS: Stable cardiomediastinal silhouette. Increased bibasilar atelectasis or edema is noted with small bilateral pleural effusions. Bony thorax is unremarkable. IMPRESSION: Increased bibasilar atelectasis or edema is noted with small bilateral pleural effusions. Electronically Signed   By: Lupita Raider M.D.   On: 06/19/2021 15:55   CT Angio Chest PE W and/or Wo Contrast  Result Date: 06/19/2021 CLINICAL DATA:  Pulmonary embolism suspected, high probability. EXAM: CT ANGIOGRAPHY CHEST WITH CONTRAST TECHNIQUE: Multidetector CT imaging of the chest was performed using the standard protocol during bolus administration of intravenous contrast. Multiplanar CT image reconstructions and MIPs were obtained to evaluate the vascular anatomy. RADIATION DOSE REDUCTION: This exam was performed according to the departmental dose-optimization program which includes  automated exposure control, adjustment of the mA and/or kV according to patient size and/or use of iterative reconstruction technique. CONTRAST:  75mL OMNIPAQUE IOHEXOL 350 MG/ML SOLN COMPARISON:  None. FINDINGS: Cardiovascular: The heart is enlarged and there is no pericardial effusion. Multi-vessel coronary artery calcifications are noted. There is atherosclerotic calcification of the aorta with mild aneurysmal dilatation of the ascending aorta measuring 4.1 cm. The pulmonary trunk is distended suggesting underlying pulmonary artery hypertension. Examination of the pulmonary arteries is limited due to respiratory motion artifact. Pulmonary artery filling defects are present in the segmental and subsegmental arteries in the left upper and lower lobes and right middle and lower lobes. No evidence of right heart strain. Mediastinum/Nodes: Shotty lymph nodes are present in the mediastinum and hilar regions bilaterally which may be reactive. No axillary lymphadenopathy. The thyroid gland, trachea, and esophagus are within normal limits. A few prominent lymph nodes are present in the periesophageal fat, not well evaluated due to respiratory motion artifact. Lungs/Pleura: Hazy attenuation is noted in the lungs bilaterally, greater on the right than on the left. There is patchy atelectasis or infiltrate at the lung bases. Small bilateral pleural effusions are noted. No pneumothorax. Upper  Abdomen: The liver has a nodular contour suggesting underlying cirrhosis. There is reflux of contrast into the inferior vena cava and hepatic veins suggesting right heart failure. Cysts are noted in the left kidney. No acute abnormality. Musculoskeletal: No acute osseous abnormality. Review of the MIP images confirms the above findings. IMPRESSION: 1. Bilateral segmental and subsegmental pulmonary emboli. No evidence of right heart strain. 2. Distended pulmonary trunk which may be associated with underlying pulmonary artery hypertension.  Reflux of contrast into the inferior vena cava and hepatic veins may be associated with right heart failure. 3. Hazy attenuation of the lungs bilaterally with patchy airspace disease in the lower lobes, possible edema, atelectasis, and/or infiltrate. 4. Small bilateral pleural effusions. 5. Aortic atherosclerosis with aneurysmal dilatation of the ascending aorta measuring up to 4.1 cm. Recommend annual imaging followup by CTA or MRA. This recommendation follows 2010 ACCF/AHA/AATS/ACR/ASA/SCA/SCAI/SIR/STS/SVM Guidelines for the Diagnosis and Management of Patients with Thoracic Aortic Disease. Circulation. 2010; 121: D220-U542. Aortic aneurysm NOS (ICD10-I71.9) 6. Cardiomegaly with coronary artery calcifications. 7. Slightly nodular contour of the liver, suggesting underlying cirrhosis. 8. Left renal cysts. Critical findings were reported to Dr. Jacqulyn Bath at 10:29 p.m. Electronically Signed   By: Thornell Sartorius M.D.   On: 06/19/2021 22:30     DG Chest 2 View  Result Date: 06/19/2021 CLINICAL DATA:  Shortness of breath. EXAM: CHEST - 2 VIEW COMPARISON:  February 23, 2021. FINDINGS: Stable cardiomediastinal silhouette. Increased bibasilar atelectasis or edema is noted with small bilateral pleural effusions. Bony thorax is unremarkable. IMPRESSION: Increased bibasilar atelectasis or edema is noted with small bilateral pleural effusions. Electronically Signed   By: Lupita Raider M.D.   On: 06/19/2021 15:55   CT Angio Chest PE W and/or Wo Contrast  Result Date: 06/19/2021 CLINICAL DATA:  Pulmonary embolism suspected, high probability. EXAM: CT ANGIOGRAPHY CHEST WITH CONTRAST TECHNIQUE: Multidetector CT imaging of the chest was performed using the standard protocol during bolus administration of intravenous contrast. Multiplanar CT image reconstructions and MIPs were obtained to evaluate the vascular anatomy. RADIATION DOSE REDUCTION: This exam was performed according to the departmental dose-optimization program which  includes automated exposure control, adjustment of the mA and/or kV according to patient size and/or use of iterative reconstruction technique. CONTRAST:  75mL OMNIPAQUE IOHEXOL 350 MG/ML SOLN COMPARISON:  None. FINDINGS: Cardiovascular: The heart is enlarged and there is no pericardial effusion. Multi-vessel coronary artery calcifications are noted. There is atherosclerotic calcification of the aorta with mild aneurysmal dilatation of the ascending aorta measuring 4.1 cm. The pulmonary trunk is distended suggesting underlying pulmonary artery hypertension. Examination of the pulmonary arteries is limited due to respiratory motion artifact. Pulmonary artery filling defects are present in the segmental and subsegmental arteries in the left upper and lower lobes and right middle and lower lobes. No evidence of right heart strain. Mediastinum/Nodes: Shotty lymph nodes are present in the mediastinum and hilar regions bilaterally which may be reactive. No axillary lymphadenopathy. The thyroid gland, trachea, and esophagus are within normal limits. A few prominent lymph nodes are present in the periesophageal fat, not well evaluated due to respiratory motion artifact. Lungs/Pleura: Hazy attenuation is noted in the lungs bilaterally, greater on the right than on the left. There is patchy atelectasis or infiltrate at the lung bases. Small bilateral pleural effusions are noted. No pneumothorax. Upper Abdomen: The liver has a nodular contour suggesting underlying cirrhosis. There is reflux of contrast into the inferior vena cava and hepatic veins suggesting right heart failure. Cysts are noted  in the left kidney. No acute abnormality. Musculoskeletal: No acute osseous abnormality. Review of the MIP images confirms the above findings. IMPRESSION: 1. Bilateral segmental and subsegmental pulmonary emboli. No evidence of right heart strain. 2. Distended pulmonary trunk which may be associated with underlying pulmonary artery  hypertension. Reflux of contrast into the inferior vena cava and hepatic veins may be associated with right heart failure. 3. Hazy attenuation of the lungs bilaterally with patchy airspace disease in the lower lobes, possible edema, atelectasis, and/or infiltrate. 4. Small bilateral pleural effusions. 5. Aortic atherosclerosis with aneurysmal dilatation of the ascending aorta measuring up to 4.1 cm. Recommend annual imaging followup by CTA or MRA. This recommendation follows 2010 ACCF/AHA/AATS/ACR/ASA/SCA/SCAI/SIR/STS/SVM Guidelines for the Diagnosis and Management of Patients with Thoracic Aortic Disease. Circulation. 2010; 121: Z610-R604. Aortic aneurysm NOS (ICD10-I71.9) 6. Cardiomegaly with coronary artery calcifications. 7. Slightly nodular contour of the liver, suggesting underlying cirrhosis. 8. Left renal cysts. Critical findings were reported to Dr. Jacqulyn Bath at 10:29 p.m. Electronically Signed   By: Thornell Sartorius M.D.   On: 06/19/2021 22:30    Assessment and Plan:   This is a 66 year old male patient with hypertension, paroxysmal atrial fibrillation on anticoagulation, COPD, diabetes, hepatitis C who presented to the ER with chief complaint of shortness of breath.  He was found to have atrial flutter with RVR when he first came to the ER.  Despite cardioversion, he continued to have severe shortness of breath hence underwent further investigation which showed bilateral PE without evidence of right heart strain, possible underlying pulmonary arterial hypertension.  Hematology was consulted given anticoagulation failure and for further anticoagulation recommendations.  It is highly unusual to have acute PE while on anticoagulation. One must suspect xarelto failure in this care. I agree with heparin/lovenox/warfarin as recommended anticoagulants If he can tolerate warfarin and can be monitored in coumadin clinic, this could be a choice. Also consider CT abdomen/pelvis to rule out malignancy since he had PE  while on anticoagulation. We will follow him outpatient and consider hypercoagulable work up. Consider BLE Duplex, may not change anticoagulation recommendations.  The length of time of the face-to-face encounter was 50 minutes. More than 50% of time was spent counseling and coordination of care.    Thank you for this referral.

## 2021-06-20 NOTE — Progress Notes (Signed)
Patient bleeding from iv site while on Heparin drip , pressure hold for about 1.5 hrs. MD and pharmacy notified, patient refuse heparin drip MD aware. See epic for new order. Will continue to monitor. ?

## 2021-06-20 NOTE — Progress Notes (Signed)
ANTICOAGULATION CONSULT NOTE  ? ?Pharmacy Consult for heparin >> lovenox ?Indication: pulmonary embolus ? ?Allergies  ?Allergen Reactions  ? Etomidate Anxiety  ? Tape Other (See Comments)  ?  SKIN WILL TEAR EASILY!!!!  ? ? ?Patient Measurements: ?Weight: 93 kg (205 lb) ?Heparin Dosing Weight: TBW ? ?Vital Signs: ?Temp: 98.1 ?F (36.7 ?C) (03/01 1642) ?Temp Source: Oral (03/01 1642) ?BP: 111/91 (03/01 1642) ?Pulse Rate: 65 (03/01 1642) ? ?Labs: ?Recent Labs  ?  06/19/21 ?1519 06/19/21 ?1711 06/20/21 ?0425 06/20/21 ?1512  ?HGB 12.2*  --  13.8  --   ?HCT 37.2*  --  41.5  --   ?PLT 146*  --  163  --   ?APTT  --   --  56* 52*  ?HEPARINUNFRC  --   --  >1.10*  --   ?CREATININE 0.92  --  0.88  --   ?TROPONINIHS 24* 30*  --   --   ? ? ? ?Estimated Creatinine Clearance: 100.1 mL/min (by C-G formula based on SCr of 0.88 mg/dL). ? ? ?Medical History: ?Past Medical History:  ?Diagnosis Date  ? Acute respiratory distress 02/22/2015  ? Atrial fibrillation with RVR ([place]) 04/19/12  ? New onset; spontaneous conversion to NSR; Pradaxa anticoagulation  ? CAD (coronary artery disease)   ? a. Cath 2008- nonobstructive, mod LAD, mild LCx & RCA disease b. Cardiolite 2010 normal c. 03/2012 abnormal Lexiscan Myoview attributed to cocaine d. 03/2012 echo: EF 55-60%, moderate LVH, moderate LA dilatation  ? Cocaine abuse ([place])   ? DM type 2 (diabetes mellitus, type 2) ([place])   ? ETOH abuse   ? 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 Elevated LFTs in the setting of acute EtOH abuse, no acute findings on abdominal u/s, HCV+, suspected acute alcohilic heptatitis  ? HTN (hypertension)   ? Hypercholesteremia   ? PAF (paroxysmal atrial fibrillation) ([place]) 02/22/2015  ? Tobacco abuse   ? ? ?Assessment: ?73 YOM presenting with SOB, hx of afib on Xarelto PTA (last dose 2/27 ~1700), CT angio showing bilateral segmental/subsegmental PE without RHS however distended pulmonary trunk and contrast reflux VC/hepatic veins - given this and last dose >24h ago will add  bolus ? ?Patient with bleeding from the IV site. Patient refusing any more heparin. Will switch to therapeutic Lovenox.  Will not start Lovenox injections for 6 hours to allow the heparin to wear off. ? ?Goal of Therapy:  ?Monitor platelets by anticoagulation protocol: Yes ?  ?Plan:  ?D/c Heparin drip ?Start Lovenox 92.5 mg (1 mg/kg) subq q12hr at 0030 ?Monitor for bleeding ? ?Alanda Slim, PharmD, FCCM ?Clinical Pharmacist ?Please see AMION for all Pharmacists' Contact Phone Numbers ?06/20/2021, 6:28 PM  ? ? ? ? ?

## 2021-06-20 NOTE — ED Notes (Signed)
Pt called out reporting that he couldn't breath. This RN went in pt.s room and noticed pt on side of bed in tripod position. This RN placed pt on non-rebreather. I educated the pt on effectiveness of bipap but pt continues to refuse. I have messaged pharmacy to send inhalers. Pt is responding to mask and breathing more effectively.  ?

## 2021-06-20 NOTE — Progress Notes (Signed)
ANTICOAGULATION CONSULT NOTE  ? ?Pharmacy Consult for heparin ?Indication: pulmonary embolus ? ?Allergies  ?Allergen Reactions  ? Etomidate Anxiety  ? Tape Other (See Comments)  ?  SKIN WILL TEAR EASILY!!!!  ? ? ?Patient Measurements: ?Weight: 93 kg (205 lb) ?Heparin Dosing Weight: TBW ? ?Vital Signs: ?BP: 118/68 (03/01 1530) ?Pulse Rate: 66 (03/01 1530) ? ?Labs: ?Recent Labs  ?  06/19/21 ?1519 06/19/21 ?1711 06/20/21 ?0425 06/20/21 ?1512  ?HGB 12.2*  --  13.8  --   ?HCT 37.2*  --  41.5  --   ?PLT 146*  --  163  --   ?APTT  --   --  56* 52*  ?HEPARINUNFRC  --   --  >1.10*  --   ?CREATININE 0.92  --  0.88  --   ?TROPONINIHS 24* 30*  --   --   ? ? ? ?Estimated Creatinine Clearance: 100.1 mL/min (by C-G formula based on SCr of 0.88 mg/dL). ? ? ?Medical History: ?Past Medical History:  ?Diagnosis Date  ? Acute respiratory distress 02/22/2015  ? Atrial fibrillation with RVR ([facility]) 04/19/12  ? New onset; spontaneous conversion to NSR; Pradaxa anticoagulation  ? CAD (coronary artery disease)   ? a. Cath 2008- nonobstructive, mod LAD, mild LCx & RCA disease b. Cardiolite 2010 normal c. 03/2012 abnormal Lexiscan Myoview attributed to cocaine d. 03/2012 echo: EF 55-60%, moderate LVH, moderate LA dilatation  ? Cocaine abuse ([facility])   ? DM type 2 (diabetes mellitus, type 2) ([facility])   ? ETOH abuse   ? 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 Elevated LFTs in the setting of acute EtOH abuse, no acute findings on abdominal u/s, HCV+, suspected acute alcohilic heptatitis  ? HTN (hypertension)   ? Hypercholesteremia   ? PAF (paroxysmal atrial fibrillation) ([facility]) 02/22/2015  ? Tobacco abuse   ? ? ?Assessment: ?51 YOM presenting with SOB, hx of afib on Xarelto PTA (last dose 2/27 ~1700), CT angio showing bilateral segmental/subsegmental PE without RHS however distended pulmonary trunk and contrast reflux VC/hepatic veins - given this and last dose >24h ago will add bolus ? ?3/1 PM update:  ?aPTT below goal ? ?Goal of Therapy:  ?Heparin level  0.3-0.7 units/ml ?aPTT 66-102 seconds ?Monitor platelets by anticoagulation protocol: Yes ?  ?Plan:  ?Inc heparin to 1900 units/hr ?2200 aPTT and qam aPTT & heparin level ? ?Jeanella Cara, PharmD, FCCM ?Clinical Pharmacist ?Please see AMION for all Pharmacists' Contact Phone Numbers ?06/20/2021, 3:50 PM  ? ? ? ? ?

## 2021-06-20 NOTE — Progress Notes (Signed)
Patient refusing CBG checks.  Last few CBGs have been stable.  MD notified.   ?

## 2021-06-20 NOTE — Progress Notes (Signed)
Echocardiogram ?2D Echocardiogram has been performed. ? ?Kevin Orozco ?06/20/2021, 2:14 PM ?

## 2021-06-20 NOTE — ED Notes (Signed)
Pt respiratory effort decreased at this time and pt requested to be placed back on [place]. Pt placed on 4L [place] and is maintaining well at 92%. MD Alinda Money updated on event and patient status.  ?

## 2021-06-20 NOTE — Progress Notes (Signed)
PROGRESS NOTE    Kevin Orozco  [id number] DOB: [birth year] DOA: 06/19/2021 PCP: [name], NP    Brief Narrative:  Kevin Orozco is a 66 y.o. male with medical history significant of substance use, atrial fibrillation, hyperlipidemia, hypertension, diabetes, claudication, COPD, CHF, CAD, hepatitis C, chronic pain presenting with new pain and shortness of breath.   3/1 found with PE. Was on 15L [place], placed on bipap this am.     Consultants:  Hematology, cardiology  Procedures:   Antimicrobials:      Subjective: Pt reports feeling little better with bipap on.  Objective: Vitals:   06/20/21 0700 06/20/21 0730 06/20/21 0850 06/20/21 0915  BP: (!) 131/98 (!) 143/88  (!) 146/86  Pulse: 72 74 83 73  Resp: (!) 25 (!) 27 (!) 29 (!) 27  Temp:      TempSrc:      SpO2: (!) 86% 90% 92% 92%  Weight:        Intake/Output Summary (Last 24 hours) at 06/20/2021 1040 Last data filed at 06/20/2021 0700 Gross per 24 hour  Intake --  Output 800 ml  Net -800 ml   Filed Weights   06/19/21 2215  Weight: 93 kg    Examination: Calm, on bipap, appears tired Decrease bs. No wheezing Reg s1/s2 no gallop Soft benign +bs No edema Awake and alert Mood and affect appropriate in current setting    Data Reviewed: I have personally reviewed following labs and imaging studies  CBC: Recent Labs  Lab 06/19/21 1519 06/20/21 0425  WBC 9.7 14.3*  HGB 12.2* 13.8  HCT 37.2* 41.5  MCV 107.8* 107.5*  PLT 146* [id number]   Basic Metabolic Panel: Recent Labs  Lab 06/19/21 1519 06/19/21 1711 06/20/21 0425  NA 135  --  137  K 3.9  --  4.1  CL 103  --  105  CO2 23  --  21*  GLUCOSE 137*  --  81  BUN 19  --  21  CREATININE 0.92  --  0.88  CALCIUM 8.6*  --  8.5*  MG  --  2.1  --    GFR: Estimated Creatinine Clearance: 100.1 mL/min (by C-G formula based on SCr of 0.88 mg/dL). Liver Function Tests: No results for input(s): AST, ALT, ALKPHOS, BILITOT, PROT, ALBUMIN in the last 168  hours. No results for input(s): LIPASE, AMYLASE in the last 168 hours. No results for input(s): AMMONIA in the last 168 hours. Coagulation Profile: No results for input(s): INR, PROTIME in the last 168 hours. Cardiac Enzymes: No results for input(s): CKTOTAL, CKMB, CKMBINDEX, TROPONINI in the last 168 hours. BNP (last 3 results) No results for input(s): PROBNP in the last 8760 hours. HbA1C: No results for input(s): HGBA1C in the last 72 hours. CBG: Recent Labs  Lab 06/20/21 0735  GLUCAP 90   Lipid Profile: No results for input(s): CHOL, HDL, LDLCALC, TRIG, CHOLHDL, LDLDIRECT in the last 72 hours. Thyroid Function Tests: No results for input(s): TSH, T4TOTAL, FREET4, T3FREE, THYROIDAB in the last 72 hours. Anemia Panel: No results for input(s): VITAMINB12, FOLATE, FERRITIN, TIBC, IRON, RETICCTPCT in the last 72 hours. Sepsis Labs: No results for input(s): PROCALCITON, LATICACIDVEN in the last 168 hours.  Recent Results (from the past 240 hour(s))  Resp Panel by RT-PCR (Flu A&B, Covid) Nasopharyngeal Swab     Status: None   Collection Time: 06/19/21 10:29 PM   Specimen: Nasopharyngeal Swab; Nasopharyngeal(NP) swabs in vial transport medium  Result Value Ref Range Status   SARS  Coronavirus 2 by RT PCR NEGATIVE NEGATIVE Final    Comment: (NOTE) SARS-CoV-2 target nucleic acids are NOT DETECTED.  The SARS-CoV-2 RNA is generally detectable in upper respiratory specimens during the acute phase of infection. The lowest concentration of SARS-CoV-2 viral copies this assay can detect is 138 copies/mL. A negative result does not preclude SARS-Cov-2 infection and should not be used as the sole basis for treatment or other patient management decisions. A negative result may occur with  improper specimen collection/handling, submission of specimen other than nasopharyngeal swab, presence of viral mutation(s) within the areas targeted by this assay, and inadequate number of  viral copies(<138 copies/mL). A negative result must be combined with clinical observations, patient history, and epidemiological information. The expected result is Negative.  Fact Sheet for Patients:  EntrepreneurPulse.com.au  Fact Sheet for Healthcare Providers:  IncredibleEmployment.be  This test is no t yet approved or cleared by the [place] FDA and  has been authorized for detection and/or diagnosis of SARS-CoV-2 by FDA under an Emergency Use Authorization (EUA). This EUA will remain  in effect (meaning this test can be used) for the duration of the COVID-19 declaration under Section 564(b)(1) of the Act, 21 U.S.C.section 360bbb-3(b)(1), unless the authorization is terminated  or revoked sooner.       Influenza A by PCR NEGATIVE NEGATIVE Final   Influenza B by PCR NEGATIVE NEGATIVE Final    Comment: (NOTE) The Xpert Xpress SARS-CoV-2/FLU/RSV plus assay is intended as an aid in the diagnosis of influenza from Nasopharyngeal swab specimens and should not be used as a sole basis for treatment. Nasal washings and aspirates are unacceptable for Xpert Xpress SARS-CoV-2/FLU/RSV testing.  Fact Sheet for Patients: EntrepreneurPulse.com.au  Fact Sheet for Healthcare Providers: IncredibleEmployment.be  This test is not yet approved or cleared by the [place] FDA and has been authorized for detection and/or diagnosis of SARS-CoV-2 by FDA under an Emergency Use Authorization (EUA). This EUA will remain in effect (meaning this test can be used) for the duration of the COVID-19 declaration under Section 564(b)(1) of the Act, 21 U.S.C. section 360bbb-3(b)(1), unless the authorization is terminated or revoked.  Performed at [facility], [place] [address], [place], [place] [address]          Radiology Studies: DG Chest 2 View  Result Date: 06/19/2021 CLINICAL DATA:  Shortness of breath.  EXAM: CHEST - 2 VIEW COMPARISON:  February 23, 2021. FINDINGS: Stable cardiomediastinal silhouette. Increased bibasilar atelectasis or edema is noted with small bilateral pleural effusions. Bony thorax is unremarkable. IMPRESSION: Increased bibasilar atelectasis or edema is noted with small bilateral pleural effusions. Electronically Signed   By: Marijo Conception M.D.   On: 06/19/2021 15:55   CT Angio Chest PE W and/or Wo Contrast  Result Date: 06/19/2021 CLINICAL DATA:  Pulmonary embolism suspected, high probability. EXAM: CT ANGIOGRAPHY CHEST WITH CONTRAST TECHNIQUE: Multidetector CT imaging of the chest was performed using the standard protocol during bolus administration of intravenous contrast. Multiplanar CT image reconstructions and MIPs were obtained to evaluate the vascular anatomy. RADIATION DOSE REDUCTION: This exam was performed according to the departmental dose-optimization program which includes automated exposure control, adjustment of the mA and/or kV according to patient size and/or use of iterative reconstruction technique. CONTRAST:  50mL OMNIPAQUE IOHEXOL 350 MG/ML SOLN COMPARISON:  None. FINDINGS: Cardiovascular: The heart is enlarged and there is no pericardial effusion. Multi-vessel coronary artery calcifications are noted. There is atherosclerotic calcification of the aorta with mild aneurysmal dilatation of  the ascending aorta measuring 4.1 cm. The pulmonary trunk is distended suggesting underlying pulmonary artery hypertension. Examination of the pulmonary arteries is limited due to respiratory motion artifact. Pulmonary artery filling defects are present in the segmental and subsegmental arteries in the left upper and lower lobes and right middle and lower lobes. No evidence of right heart strain. Mediastinum/Nodes: Shotty lymph nodes are present in the mediastinum and hilar regions bilaterally which may be reactive. No axillary lymphadenopathy. The thyroid gland, trachea, and esophagus  are within normal limits. A few prominent lymph nodes are present in the periesophageal fat, not well evaluated due to respiratory motion artifact. Lungs/Pleura: Hazy attenuation is noted in the lungs bilaterally, greater on the right than on the left. There is patchy atelectasis or infiltrate at the lung bases. Small bilateral pleural effusions are noted. No pneumothorax. Upper Abdomen: The liver has a nodular contour suggesting underlying cirrhosis. There is reflux of contrast into the inferior vena cava and hepatic veins suggesting right heart failure. Cysts are noted in the left kidney. No acute abnormality. Musculoskeletal: No acute osseous abnormality. Review of the MIP images confirms the above findings. IMPRESSION: 1. Bilateral segmental and subsegmental pulmonary emboli. No evidence of right heart strain. 2. Distended pulmonary trunk which may be associated with underlying pulmonary artery hypertension. Reflux of contrast into the inferior vena cava and hepatic veins may be associated with right heart failure. 3. Hazy attenuation of the lungs bilaterally with patchy airspace disease in the lower lobes, possible edema, atelectasis, and/or infiltrate. 4. Small bilateral pleural effusions. 5. Aortic atherosclerosis with aneurysmal dilatation of the ascending aorta measuring up to 4.1 cm. Recommend annual imaging followup by CTA or MRA. This recommendation follows 2010 ACCF/AHA/AATS/ACR/ASA/SCA/SCAI/SIR/STS/SVM Guidelines for the Diagnosis and Management of Patients with Thoracic Aortic Disease. Circulation. 2010; 121: Z025-E527. Aortic aneurysm NOS (ICD10-I71.9) 6. Cardiomegaly with coronary artery calcifications. 7. Slightly nodular contour of the liver, suggesting underlying cirrhosis. 8. Left renal cysts. Critical findings were reported to Dr. Jacqulyn Bath at 10:29 p.m. Electronically Signed   By: Thornell Sartorius M.D.   On: 06/19/2021 22:30        Scheduled Meds:  diltiazem  120 mg Oral Daily   fluticasone  furoate-vilanterol  1 puff Inhalation Daily   And   umeclidinium bromide  1 puff Inhalation Daily   furosemide  40 mg Intravenous BID   gabapentin  300 mg Oral QHS   insulin aspart  0-9 Units Subcutaneous TID WC   isosorbide mononitrate  30 mg Oral Daily   metoprolol  200 mg Oral q AM   morphine  30 mg Oral Q12H   pantoprazole  40 mg Oral QAC breakfast   predniSONE  10 mg Oral Q breakfast   ranolazine  500 mg Oral BID   rosuvastatin  10 mg Oral Daily   sodium chloride flush  3 mL Intravenous Q12H   spironolactone  25 mg Oral Daily   Continuous Infusions:  heparin 1,700 Units/hr (06/20/21 0618)    Assessment & Plan:   Principal Problem:   Acute pulmonary embolism ([facility]) Active Problems:   CAD (coronary artery disease)   Hyperlipidemia   Essential hypertension   Paroxysmal A-fib ([facility])   Acute respiratory failure with hypoxia ([facility])   Chronic heart failure with preserved ejection fraction (H[facility])   Chronic obstructive pulmonary disease (HCC)   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[facility])   Anxiety   Acute pulmonary embolism Acute respiratory failure with hypoxia Acute resp. Failure due to  copd exac, acute chf, and PE On heparin gtt 'consulted hematology since pt was on Xeralto as outpatient PCCM consulted this am as pt was placed on bipap requiring more 02 supplementation. Iv lasix Cards following , input was appreciated. CT no RV strian Ck echo Continue inhalers    Atrial fibrillation Cards following Hold beta blk due to wheezing Can consider multaq outpt to be resumed pending echo results   CAD with stable Angina Continue Imdur, Ranexa Hold beta-blockers as above Continue statins   Acute on chronic HFpEF Echo pending Bnp elevated from baseline Continue aldactone, imdur Hold beta blk as above Continue iv lasix bid   Leukocytosis Possibly reactive Afebrile . Monitor  Elevated Troponin 2/2 demand ischemia Ck echo      Hypertension - Continue diltiazem, Imdur, spironolactone  Hyperlipidemia - Continue home rosuvastatin   COPD - Replace home Trelegy with formulary Breo and Incruse - As needed DuoNebs - Continue Daily Prednisone  Chronic pain - Continue home morphine and as needed oxycodone -Will need to confirm if he takes lubiprostone, Movantik or both, resume when confirmed by pharmacy   Anxiety Insomnia - Continue home as needed Klonopin - Continue home trazodone,    DM  - SSI   DVT prophylaxis: Heparin drip Code Status:full Family Communication: none at bedside Disposition Plan:  Status is: Inpatient Remains inpatient appropriate because: IV treatment. On bipap.                 LOS: 0 days   Time spent: 45 min with >50% on coc    [name], MD [facility] Pager [phone]  If 7PM-7AM, please contact night-coverage 06/20/2021, 10:40 AM

## 2021-06-21 ENCOUNTER — Telehealth: Payer: Self-pay | Admitting: Hematology and Oncology

## 2021-06-21 ENCOUNTER — Inpatient Hospital Stay (HOSPITAL_COMMUNITY): Payer: Medicaid Other

## 2021-06-21 DIAGNOSIS — I2694 Multiple subsegmental pulmonary emboli without acute cor pulmonale: Secondary | ICD-10-CM | POA: Diagnosis not present

## 2021-06-21 DIAGNOSIS — I2699 Other pulmonary embolism without acute cor pulmonale: Secondary | ICD-10-CM | POA: Diagnosis not present

## 2021-06-21 LAB — CBC
HCT: 37.7 % — ABNORMAL LOW (ref 39.0–52.0)
Hemoglobin: 12.9 g/dL — ABNORMAL LOW (ref 13.0–17.0)
MCH: 35.4 pg — ABNORMAL HIGH (ref 26.0–34.0)
MCHC: 34.2 g/dL (ref 30.0–36.0)
MCV: 103.6 fL — ABNORMAL HIGH (ref 80.0–100.0)
Platelets: 146 10*3/uL — ABNORMAL LOW (ref 150–400)
RBC: 3.64 MIL/uL — ABNORMAL LOW (ref 4.22–5.81)
RDW: 14.5 % (ref 11.5–15.5)
WBC: 11.2 10*3/uL — ABNORMAL HIGH (ref 4.0–10.5)
nRBC: 0 % (ref 0.0–0.2)

## 2021-06-21 LAB — BASIC METABOLIC PANEL
Anion gap: 12 (ref 5–15)
BUN: 20 mg/dL (ref 8–23)
CO2: 24 mmol/L (ref 22–32)
Calcium: 8.6 mg/dL — ABNORMAL LOW (ref 8.9–10.3)
Chloride: 100 mmol/L (ref 98–111)
Creatinine, Ser: 1.06 mg/dL (ref 0.61–1.24)
GFR, Estimated: >60 mL/min (ref >60)
Glucose, Bld: 83 mg/dL (ref 70–99)
Potassium: 3.8 mmol/L (ref 3.5–5.1)
Sodium: 136 mmol/L (ref 135–145)

## 2021-06-21 LAB — LIPID PANEL
Cholesterol: 83 mg/dL (ref 0–200)
HDL: 37 mg/dL — ABNORMAL LOW (ref >40)
LDL Cholesterol: 39 mg/dL (ref 0–99)
Total CHOL/HDL Ratio: 2.2 RATIO
Triglycerides: 37 mg/dL (ref <150)
VLDL: 7 mg/dL (ref 0–40)

## 2021-06-21 MED ORDER — ENOXAPARIN SODIUM 100 MG/ML IJ SOSY
95.0000 mg | PREFILLED_SYRINGE | Freq: Two times a day (BID) | INTRAMUSCULAR | Status: DC
  Administered 2021-06-21 – 2021-06-25 (×10): 95 mg via SUBCUTANEOUS
  Filled 2021-06-21 (×10): qty 1

## 2021-06-21 MED ORDER — COUMADIN BOOK
Freq: Once | Status: AC
  Filled 2021-06-21: qty 1

## 2021-06-21 MED ORDER — WARFARIN - PHARMACIST DOSING INPATIENT: Freq: Every day | Status: DC

## 2021-06-21 MED ORDER — IOHEXOL 300 MG/ML  SOLN
100.0000 mL | Freq: Once | INTRAMUSCULAR | Status: AC | PRN
  Administered 2021-06-21: 100 mL via INTRAVENOUS

## 2021-06-21 MED ORDER — DRONEDARONE HCL 400 MG PO TABS
400.0000 mg | ORAL_TABLET | Freq: Two times a day (BID) | ORAL | Status: DC
  Administered 2021-06-21 – 2021-06-26 (×10): 400 mg via ORAL
  Filled 2021-06-21 (×10): qty 1

## 2021-06-21 MED ORDER — IOHEXOL 9 MG/ML PO SOLN
ORAL | Status: AC
  Administered 2021-06-21: 500 mL
  Filled 2021-06-21: qty 1000

## 2021-06-21 MED ORDER — WARFARIN SODIUM 7.5 MG PO TABS
7.5000 mg | ORAL_TABLET | Freq: Once | ORAL | Status: AC
  Administered 2021-06-21: 7.5 mg via ORAL
  Filled 2021-06-21: qty 1

## 2021-06-21 MED ORDER — OXYCODONE HCL 5 MG PO TABS
5.0000 mg | ORAL_TABLET | Freq: Once | ORAL | Status: AC
  Administered 2021-06-21: 5 mg via ORAL
  Filled 2021-06-21: qty 1

## 2021-06-21 NOTE — Progress Notes (Signed)
Lower extremity venous has been completed.  ? ?Preliminary results in CV Proc.  ? ?Kevin Orozco ?06/21/2021 11:38 AM    ?

## 2021-06-21 NOTE — Progress Notes (Signed)
Patient hesitant to have new IV placed. Explained that he would need IV for planned CT scan. Patient agreed to let IV team come place PIV. ?

## 2021-06-21 NOTE — Consult Note (Signed)
NAME:  Kevin Orozco, MRN:  [id number], DOB:  22-Jan-1956, LOS: 1 ADMISSION DATE:  06/19/2021, CONSULTATION DATE: 06/20/2021 REFERRING MD: I had, CHIEF COMPLAINT: Hypoxic respiratory failure  History of Present Illness:  66 year old male lifelong smoker polysubstance abuse cocaine and marijuana use had multiple episodes of increasing shortness of breath usually associated with atrial for breath ventricular response.  Similar episode currently over the last 3 to 4 days.  Noted to have bilateral PEs despite being on Xarelto.  He has been transitioned to noninvasive mechanical ventilatory support with an FiO2 60% with O2 sats of 90% and tidal volumes of 1100 cc with a rate of 16.  Pulmonary critical care asked to evaluate due to the fact he has had PEs in the setting of anticoagulation with Xarelto for his history of atrial fibrillation.  Despite being in a sinus rhythm he is hypoxic.  Reports a pack and half a day smoker lifelong.  Denies fevers chills sweats or purulent sputum at this time.  He is a full code and should he deteriorate will require intubation and full mechanical ventilatory support.  Pertinent  Medical History   Past Medical History:  Diagnosis Date   Acute respiratory distress 02/22/2015   Atrial fibrillation with RVR (HCC) 04/19/12   New onset; spontaneous conversion to NSR; Pradaxa anticoagulation   CAD (coronary artery disease)    a. Cath 2008- nonobstructive, mod LAD, mild LCx & RCA disease b. Cardiolite 2010 normal c. 03/2012 abnormal Lexiscan Myoview attributed to cocaine d. 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 Feels a bit better. Suspect in large part to diuresis.  Objective   Blood pressure 105/61, pulse 73, temperature 97.9 F (36.6 C), temperature source Oral, resp. rate (!) 23, weight 94.2 kg, SpO2 95 %.        Intake/Output Summary (Last 24 hours) at 06/21/2021 1829 Last data filed at 06/21/2021 9371 Gross per 24 hour  Intake 1335 ml  Output 2400 ml  Net -1065 ml    Filed Weights   06/19/21 2215 06/21/21 0503  Weight: 93 kg 94.2 kg    Examination: General: Malnourished male currently on [place] HENT: AT, no icterus Lungs: poor air movement Cardiovascular: Regular rate regular rhythm Abdomen: Protuberant soft Extremities: Without edema warm Neuro: Grossly intact no focal deficit   Resolved Hospital Problem list     Assessment & Plan:  Acute hypoxic respiratory failure in the setting of the new diagnosis of bilateral PE despite being on Xarelto also in the setting of congestive heart failure with volume overload on imaging Aggressive diuresis per cardiology Continue anticoagulation Continue chronic prednisone Wean O2 for sat goal 88%  Bilateral pulmonary embolus despite being on anticoagulation with Xarelto AC per primary  PCCM will sign off  Best Practice (right click and "Reselect all SmartList Selections" daily)   Per primary  Labs  CBC: Recent Labs  Lab 06/19/21 1519 06/20/21 0425 06/21/21 0449  WBC 9.7 14.3* 11.2*  HGB 12.2* 13.8 12.9*  HCT 37.2* 41.5 37.7*  MCV 107.8* 107.5* 103.6*  PLT 146* 163 146*     Basic Metabolic Panel: Recent Labs  Lab 06/19/21 1519 06/19/21 1711 06/20/21 0425 06/21/21 0449  NA 135  --  137 136  K 3.9  --  4.1 3.8  CL 103  --  105 100  CO2 23  --  21* 24  GLUCOSE 137*  --  81 83  BUN 19   --  21 20  CREATININE 0.92  --  0.88 1.06  CALCIUM 8.6*  --  8.5* 8.6*  MG  --  2.1  --   --     GFR: Estimated Creatinine Clearance: 83.6 mL/min (by C-G formula based on SCr of 1.06 mg/dL). Recent Labs  Lab 06/19/21 1519 06/20/21 0425 06/21/21 0449  WBC 9.7 14.3* 11.2*     Liver Function Tests: No results for input(s): AST, ALT, ALKPHOS, BILITOT, PROT, ALBUMIN in the last 168 hours. No results for input(s): LIPASE, AMYLASE in the last 168 hours. No results for input(s): AMMONIA in the last 168 hours.  ABG    Component Value Date/Time   PHART 7.465 (H) 02/22/2015 1213   PCO2ART 29.9 (L) 02/22/2015 1213   PO2ART 63.0 (L) 02/22/2015 1213   HCO3 21.5 02/22/2015 1213   TCO2 22 02/22/2015 1213   ACIDBASEDEF 1.0 02/22/2015 1213   O2SAT 93.0 02/22/2015 1213      Coagulation Profile: No results for input(s): INR, PROTIME in the last 168 hours.  Cardiac Enzymes: No results for input(s): CKTOTAL, CKMB, CKMBINDEX, TROPONINI in the last 168 hours.  HbA1C: Hgb A1c MFr Bld  Date/Time Value Ref Range Status  09/09/2019 11:22 AM 5.4 4.8 - 5.6 % Final    Comment:             Prediabetes: 5.7 - 6.4          Diabetes: >6.4          Glycemic control for adults with diabetes: <7.0   02/22/2015 01:28 PM 5.0 4.8 - 5.6 % Final    Comment:    (NOTE)         Pre-diabetes: 5.7 - 6.4         Diabetes: >6.4         Glycemic control for adults with diabetes: <7.0     CBG: Recent Labs  Lab 06/20/21 0735 06/20/21 1152 06/20/21 1728  GLUCAP 90 107* 104*     Review of Systems:   N/a   Past Medical History:  He,  has a past medical history of Acute respiratory distress (02/22/2015), Atrial fibrillation with RVR (HCC) (04/19/12), CAD (coronary artery disease), Cocaine abuse (HCC), DM type 2 (diabetes mellitus, type 2) (HCC), ETOH abuse, HCV antibody positive (03/2012), HTN (hypertension), Hypercholesteremia, PAF (paroxysmal atrial fibrillation) (HCC) (02/22/2015), and Tobacco  abuse.   Surgical History:   Past Surgical History:  Procedure Laterality Date   APPENDECTOMY     CARDIOVERSION N/A 02/21/2020   Procedure: CARDIOVERSION;  Surgeon: Yates Decamp, MD;  Location: [facility] ENDOSCOPY;  Service: Cardiovascular;  Laterality: N/A;   FOOT SURGERY       Social History:   reports that he has been smoking cigarettes. He has a 40.00 pack-year smoking history. He has never used smokeless tobacco. He reports that he does not currently use alcohol after a past usage of about 12.0  standard drinks per week. He reports current drug use. Drugs: Cocaine, Morphine, and Oxycodone.   Family History:  His family history includes Diabetes in his sister; Diabetes (age of onset: 2256) in his mother; Heart attack in his sister; Heart attack (age of onset: 4565) in his father.   Allergies Allergies  Allergen Reactions   Etomidate Anxiety   Tape Other (See Comments)    SKIN WILL TEAR EASILY!!!!     Home Medications  Prior to Admission medications   Medication Sig Start Date End Date Taking? Authorizing Provider  clonazePAM (KLONOPIN) 0.5 MG tablet Take 0.5-1 mg by mouth See admin instructions. Take 1 mg by mouth at bedtime and an additional 0.5-1 mg in the morning as needed for anxiety    [provider]  diclofenac sodium (VOLTAREN) 1 % GEL Apply 2 g topically 4 (four) times daily as needed (to painful sites).    [provider]  diltiazem (CARDIZEM CD) 120 MG 24 hr capsule Take 1 capsule (120 mg total) by mouth daily. 06/05/21 05/31/22  Cantwell, Celeste C, PA-C  finasteride (PROSCAR) 5 MG tablet Take 5 mg by mouth at bedtime.    [provider]  furosemide (LASIX) 40 MG tablet Take 1 tablet (40 mg total) by mouth daily as needed. For fluid weight gain- Patient taking differently: Take 40 mg by mouth in the morning. 02/22/20 06/05/21  Calvert Cantorizwan, Saima, MD  gabapentin (NEURONTIN) 300 MG capsule Take 300 mg by mouth at bedtime.    [provider]   ipratropium-albuterol (DUONEB) 0.5-2.5 (3) MG/3ML SOLN Take 3 mLs by nebulization 4 (four) times daily as needed (for shortness of breath or wheezing).    [provider]  isosorbide mononitrate (IMDUR) 30 MG 24 hr tablet Take 1 tablet (30 mg total) by mouth daily. 02/26/15   Drema DallasWoods, Curtis J, MD  lubiprostone (AMITIZA) 8 MCG capsule Take 8 mcg by mouth daily with breakfast.    [provider]  metoprolol (TOPROL-XL) 200 MG 24 hr tablet Take 1 tablet (200 mg total) by mouth in the morning. 02/02/21   Cantwell, Celeste C, PA-C  morphine (MS CONTIN) 30 MG 12 hr tablet Take 30 mg by mouth See admin instructions. Take 30 mg by mouth at 8 AM and 8 PM    [provider]  MOVANTIK 12.5 MG TABS tablet Take 12.5 mg by mouth every morning. 05/30/21   [provider]  Naloxone HCl (KLOXXADO) 8 MG/0.1ML LIQD Place into the nose as needed (for accidental overdose).    [provider]  nitroGLYCERIN (NITROSTAT) 0.4 MG SL tablet Place under the tongue every 5 (five) minutes as needed for chest pain.    [provider]  oxyCODONE-acetaminophen (PERCOCET) 10-325 MG tablet Take 1 tablet by mouth every 4 (four) hours as needed. 05/28/21   [provider]  pantoprazole (PROTONIX) 40 MG tablet Take 40 mg by mouth daily before breakfast.    [provider]  predniSONE (DELTASONE) 10 MG tablet Take 10 mg by mouth daily with breakfast.    [provider]  ranolazine (RANEXA) 500 MG 12 hr tablet TAKE ONE TABLET BY MOUTH TWICE DAILY Patient taking differently: Take 500 mg by mouth in the morning and at bedtime. 02/06/21   Cantwell, Celeste C, PA-C  rosuvastatin (CRESTOR) 10 MG tablet Take 1 tablet (10 mg total) by mouth daily. 01/08/21 06/05/21  [name], Anabel BeneManish J, MD  sertraline (ZOLOFT) 50 MG tablet Take 50 mg by mouth daily.  [provider]  spironolactone (ALDACTONE) 25 MG tablet Take 1 tablet (25 mg total) by mouth daily. 02/23/20    Calvert Cantor, MD  tamsulosin (FLOMAX) 0.4 MG CAPS capsule Take 0.4 mg by mouth at bedtime. 08/21/16   [provider]  traZODone (DESYREL) 100 MG tablet Take 50-100 mg by mouth at bedtime as needed for sleep.    [provider]  Dwyane Luo 200-62.5-25 MCG/INH AEPB Inhale 1 puff into the lungs daily. 01/31/21   [provider]  XARELTO 20 MG TABS tablet Take 1 tablet (20 mg total) by mouth daily with supper. 03/07/21   Rayford Halsted, PA-C     Critical care time: n/a    Karren Burly, MD [facility] Please consult Amion 06/21/2021, 9:27 AM

## 2021-06-21 NOTE — Progress Notes (Signed)
PROGRESS NOTE    Kevin Orozco  SUP:[id number] DOB: 1956-02-26 DOA: 06/19/2021 PCP: Marletta Lor, NP    Brief Narrative:  Kevin Orozco is a 66 y.o. male with medical history significant of substance use, atrial fibrillation, hyperlipidemia, hypertension, diabetes, claudication, COPD, CHF, CAD, hepatitis C, chronic pain presenting with new pain and shortness of breath.   3/1 found with PE. Was on 15L [place], placed on bipap this am.  3/2 on HF , reports feeling better. Overnight issues where heparin gtt addressed and was switched to lovenox. Discussed with patient about Coumadin, INR checks, risk and possible complications including hemorrhagic head bleed and GI bleed and he verbalizes understanding.   Consultants:  Hematology, cardiology  Procedures:   Antimicrobials:      Subjective: Little less short of breath, no dizziness or chest pain  Objective: Vitals:   06/20/21 1944 06/21/21 0112 06/21/21 0200 06/21/21 0503  BP: 122/78 (!) 138/92  (!) 146/75  Pulse: 67 82  84  Resp: (!) 24 (!) 21  (!) 22  Temp: 98.2 F (36.8 C) 98.7 F (37.1 C)  98.7 F (37.1 C)  TempSrc: Oral Oral  Oral  SpO2: 95% 98% 94% 96%  Weight:    94.2 kg    Intake/Output Summary (Last 24 hours) at 06/21/2021 0820 Last data filed at 06/21/2021 5929 Gross per 24 hour  Intake 1015 ml  Output 2400 ml  Net -1385 ml   Filed Weights   06/19/21 2215 06/21/21 0503  Weight: 93 kg 94.2 kg    Calm, NAD Decreased breath sounds no wheezing Reg s1/s2 no gallop Soft benign +bs No edema Aaoxox3  Mood and affect appropriate in current setting    Data Reviewed: I have personally reviewed following labs and imaging studies  CBC: Recent Labs  Lab 06/19/21 1519 06/20/21 0425 06/21/21 0449  WBC 9.7 14.3* 11.2*  HGB 12.2* 13.8 12.9*  HCT 37.2* 41.5 37.7*  MCV 107.8* 107.5* 103.6*  PLT 146* 163 146*   Basic Metabolic Panel: Recent Labs  Lab 06/19/21 1519 06/19/21 1711 06/20/21 0425 06/21/21 0449   NA 135  --  137 136  K 3.9  --  4.1 3.8  CL 103  --  105 100  CO2 23  --  21* 24  GLUCOSE 137*  --  81 83  BUN 19  --  21 20  CREATININE 0.92  --  0.88 1.06  CALCIUM 8.6*  --  8.5* 8.6*  MG  --  2.1  --   --    GFR: Estimated Creatinine Clearance: 83.6 mL/min (by C-G formula based on SCr of 1.06 mg/dL). Liver Function Tests: No results for input(s): AST, ALT, ALKPHOS, BILITOT, PROT, ALBUMIN in the last 168 hours. No results for input(s): LIPASE, AMYLASE in the last 168 hours. No results for input(s): AMMONIA in the last 168 hours. Coagulation Profile: No results for input(s): INR, PROTIME in the last 168 hours. Cardiac Enzymes: No results for input(s): CKTOTAL, CKMB, CKMBINDEX, TROPONINI in the last 168 hours. BNP (last 3 results) No results for input(s): PROBNP in the last 8760 hours. HbA1C: No results for input(s): HGBA1C in the last 72 hours. CBG: Recent Labs  Lab 06/20/21 0735 06/20/21 1152 06/20/21 1728  GLUCAP 90 107* 104*   Lipid Profile: No results for input(s): CHOL, HDL, LDLCALC, TRIG, CHOLHDL, LDLDIRECT in the last 72 hours. Thyroid Function Tests: No results for input(s): TSH, T4TOTAL, FREET4, T3FREE, THYROIDAB in the last 72 hours. Anemia Panel: No results for input(s):  VITAMINB12, FOLATE, FERRITIN, TIBC, IRON, RETICCTPCT in the last 72 hours. Sepsis Labs: No results for input(s): PROCALCITON, LATICACIDVEN in the last 168 hours.  Recent Results (from the past 240 hour(s))  Resp Panel by RT-PCR (Flu A&B, Covid) Nasopharyngeal Swab     Status: None   Collection Time: 06/19/21 10:29 PM   Specimen: Nasopharyngeal Swab; Nasopharyngeal(NP) swabs in vial transport medium  Result Value Ref Range Status   SARS Coronavirus 2 by RT PCR NEGATIVE NEGATIVE Final    Comment: (NOTE) SARS-CoV-2 target nucleic acids are NOT DETECTED.  The SARS-CoV-2 RNA is generally detectable in upper respiratory specimens during the acute phase of infection. The lowest concentration  of SARS-CoV-2 viral copies this assay can detect is 138 copies/mL. A negative result does not preclude SARS-Cov-2 infection and should not be used as the sole basis for treatment or other patient management decisions. A negative result may occur with  improper specimen collection/handling, submission of specimen other than nasopharyngeal swab, presence of viral mutation(s) within the areas targeted by this assay, and inadequate number of viral copies(<138 copies/mL). A negative result must be combined with clinical observations, patient history, and epidemiological information. The expected result is Negative.  Fact Sheet for Patients:  BloggerCourse.comhttps://www.fda.gov/media/152166/download  Fact Sheet for Healthcare Providers:  SeriousBroker.ithttps://www.fda.gov/media/152162/download  This test is no t yet approved or cleared by the [place] FDA and  has been authorized for detection and/or diagnosis of SARS-CoV-2 by FDA under an Emergency Use Authorization (EUA). This EUA will remain  in effect (meaning this test can be used) for the duration of the COVID-19 declaration under Section 564(b)(1) of the Act, 21 U.S.C.section 360bbb-3(b)(1), unless the authorization is terminated  or revoked sooner.       Influenza A by PCR NEGATIVE NEGATIVE Final   Influenza B by PCR NEGATIVE NEGATIVE Final    Comment: (NOTE) The Xpert Xpress SARS-CoV-2/FLU/RSV plus assay is intended as an aid in the diagnosis of influenza from Nasopharyngeal swab specimens and should not be used as a sole basis for treatment. Nasal washings and aspirates are unacceptable for Xpert Xpress SARS-CoV-2/FLU/RSV testing.  Fact Sheet for Patients: BloggerCourse.comhttps://www.fda.gov/media/152166/download  Fact Sheet for Healthcare Providers: SeriousBroker.ithttps://www.fda.gov/media/152162/download  This test is not yet approved or cleared by the [place] FDA and has been authorized for detection and/or diagnosis of SARS-CoV-2 by FDA under an Emergency Use  Authorization (EUA). This EUA will remain in effect (meaning this test can be used) for the duration of the COVID-19 declaration under Section 564(b)(1) of the Act, 21 U.S.C. section 360bbb-3(b)(1), unless the authorization is terminated or revoked.  Performed at [facility], [address] [place], [place] [address]          Radiology Studies: DG Chest 2 View  Result Date: 06/19/2021 CLINICAL DATA:  Shortness of breath. EXAM: CHEST - 2 VIEW COMPARISON:  February 23, 2021. FINDINGS: Stable cardiomediastinal silhouette. Increased bibasilar atelectasis or edema is noted with small bilateral pleural effusions. Bony thorax is unremarkable. IMPRESSION: Increased bibasilar atelectasis or edema is noted with small bilateral pleural effusions. Electronically Signed   By: Lupita RaiderJames  Green Jr M.D.   On: 06/19/2021 15:55   CT Angio Chest PE W and/or Wo Contrast  Result Date: 06/19/2021 CLINICAL DATA:  Pulmonary embolism suspected, high probability. EXAM: CT ANGIOGRAPHY CHEST WITH CONTRAST TECHNIQUE: Multidetector CT imaging of the chest was performed using the standard protocol during bolus administration of intravenous contrast. Multiplanar CT image reconstructions and MIPs were obtained to evaluate the vascular anatomy.  RADIATION DOSE REDUCTION: This exam was performed according to the departmental dose-optimization program which includes automated exposure control, adjustment of the mA and/or kV according to patient size and/or use of iterative reconstruction technique. CONTRAST:  45mL OMNIPAQUE IOHEXOL 350 MG/ML SOLN COMPARISON:  None. FINDINGS: Cardiovascular: The heart is enlarged and there is no pericardial effusion. Multi-vessel coronary artery calcifications are noted. There is atherosclerotic calcification of the aorta with mild aneurysmal dilatation of the ascending aorta measuring 4.1 cm. The pulmonary trunk is distended suggesting underlying pulmonary artery hypertension. Examination of  the pulmonary arteries is limited due to respiratory motion artifact. Pulmonary artery filling defects are present in the segmental and subsegmental arteries in the left upper and lower lobes and right middle and lower lobes. No evidence of right heart strain. Mediastinum/Nodes: Shotty lymph nodes are present in the mediastinum and hilar regions bilaterally which may be reactive. No axillary lymphadenopathy. The thyroid gland, trachea, and esophagus are within normal limits. A few prominent lymph nodes are present in the periesophageal fat, not well evaluated due to respiratory motion artifact. Lungs/Pleura: Hazy attenuation is noted in the lungs bilaterally, greater on the right than on the left. There is patchy atelectasis or infiltrate at the lung bases. Small bilateral pleural effusions are noted. No pneumothorax. Upper Abdomen: The liver has a nodular contour suggesting underlying cirrhosis. There is reflux of contrast into the inferior vena cava and hepatic veins suggesting right heart failure. Cysts are noted in the left kidney. No acute abnormality. Musculoskeletal: No acute osseous abnormality. Review of the MIP images confirms the above findings. IMPRESSION: 1. Bilateral segmental and subsegmental pulmonary emboli. No evidence of right heart strain. 2. Distended pulmonary trunk which may be associated with underlying pulmonary artery hypertension. Reflux of contrast into the inferior vena cava and hepatic veins may be associated with right heart failure. 3. Hazy attenuation of the lungs bilaterally with patchy airspace disease in the lower lobes, possible edema, atelectasis, and/or infiltrate. 4. Small bilateral pleural effusions. 5. Aortic atherosclerosis with aneurysmal dilatation of the ascending aorta measuring up to 4.1 cm. Recommend annual imaging followup by CTA or MRA. This recommendation follows 2010 ACCF/AHA/AATS/ACR/ASA/SCA/SCAI/SIR/STS/SVM Guidelines for the Diagnosis and Management of Patients  with Thoracic Aortic Disease. Circulation. 2010; 121: D220-U542. Aortic aneurysm NOS (ICD10-I71.9) 6. Cardiomegaly with coronary artery calcifications. 7. Slightly nodular contour of the liver, suggesting underlying cirrhosis. 8. Left renal cysts. Critical findings were reported to Dr. Jacqulyn Bath at 10:29 p.m. Electronically Signed   By: Thornell Sartorius M.D.   On: 06/19/2021 22:30   ECHOCARDIOGRAM COMPLETE  Result Date: 06/20/2021    ECHOCARDIOGRAM REPORT   Patient Name:   Kevin Orozco Date of Exam: 06/20/2021 Medical Rec #:  [id number]      Height:       71.0 in Accession #:    [id number]     Weight:       205.0 lb Date of Birth:  1956/04/09      BSA:          2.131 m Patient Age:    65 years       BP:           132/81 mmHg Patient Gender: M              HR:           67 bpm. Exam Location:  Inpatient Procedure: 2D Echo Indications:    Acute respiratory distress  History:        Patient has prior  history of Echocardiogram examinations, most                 recent 02/13/2021. CAD; Risk Factors:Diabetes and Hypertension.  Sonographer:    Devonne Doughty Referring Phys: [id number] CELESTE C CANTWELL  Sonographer Comments: Image acquisition challenging due to patient body habitus and supine. IMPRESSIONS  1. Left ventricular ejection fraction, by estimation, is 60 to 65%. The left ventricle has normal function. The left ventricle has no regional wall motion abnormalities. There is mild left ventricular hypertrophy. Left ventricular diastolic parameters are indeterminate.  2. Right ventricular systolic function is moderately reduced. The right ventricular size is normal.  3. Left atrial size was severely dilated.  4. Right atrial size was moderately dilated.  5. The mitral valve is grossly normal. Mild mitral valve regurgitation.  6. The aortic valve is grossly normal. Aortic valve regurgitation is trivial. FINDINGS  Left Ventricle: Left ventricular ejection fraction, by estimation, is 60 to 65%. The left ventricle has normal  function. The left ventricle has no regional wall motion abnormalities. The left ventricular internal cavity size was normal in size. There is  mild left ventricular hypertrophy. Left ventricular diastolic parameters are indeterminate. Right Ventricle: The right ventricular size is normal. Right vetricular wall thickness was not well visualized. Right ventricular systolic function is moderately reduced. Left Atrium: Left atrial size was severely dilated. Right Atrium: Right atrial size was moderately dilated. Pericardium: There is no evidence of pericardial effusion. Mitral Valve: The mitral valve is grossly normal. Mild mitral valve regurgitation. Tricuspid Valve: The tricuspid valve is grossly normal. Tricuspid valve regurgitation is not demonstrated. Aortic Valve: The aortic valve is grossly normal. Aortic valve regurgitation is trivial. Aortic regurgitation PHT measures 535 msec. Aortic valve mean gradient measures 3.0 mmHg. Aortic valve peak gradient measures 5.7 mmHg. Aortic valve area, by VTI measures 3.21 cm. Pulmonic Valve: The pulmonic valve was not well visualized. Pulmonic valve regurgitation is not visualized. Aorta: The aortic root and ascending aorta are structurally normal, with no evidence of dilitation. IAS/Shunts: The atrial septum is grossly normal.  LEFT VENTRICLE PLAX 2D LVIDd:         4.30 cm   Diastology LVIDs:         3.00 cm   LV e' medial:    7.72 cm/s LV PW:         1.30 cm   LV E/e' medial:  13.2 LV IVS:        1.30 cm   LV e' lateral:   8.81 cm/s LVOT diam:     2.00 cm   LV E/e' lateral: 11.6 LV SV:         84 LV SV Index:   39 LVOT Area:     3.14 cm  RIGHT VENTRICLE             IVC RV Basal diam:  3.70 cm     IVC diam: 2.70 cm RV Mid diam:    2.90 cm RV S prime:     10.10 cm/s TAPSE (M-mode): 1.2 cm LEFT ATRIUM              Index        RIGHT ATRIUM           Index LA diam:        4.50 cm  2.11 cm/m   RA Area:     25.20 cm LA Vol (A2C):   120.0 ml 56.32 ml/m  RA Volume:   81.00 ml  38.01 ml/m LA Vol (A4C):   90.8 ml  42.61 ml/m LA Biplane Vol: 106.0 ml 49.75 ml/m  AORTIC VALVE AV Area (Vmax):    3.25 cm AV Area (Vmean):   3.09 cm AV Area (VTI):     3.21 cm AV Vmax:           119.00 cm/s AV Vmean:          84.500 cm/s AV VTI:            0.260 m AV Peak Grad:      5.7 mmHg AV Mean Grad:      3.0 mmHg LVOT Vmax:         123.00 cm/s LVOT Vmean:        83.200 cm/s LVOT VTI:          0.266 m LVOT/AV VTI ratio: 1.02 AI PHT:            535 msec  AORTA Ao Root diam: 3.70 cm Ao Asc diam:  3.80 cm MITRAL VALVE MV Area (PHT): 3.53 cm     SHUNTS MV Decel Time: 215 msec     Systemic VTI:  0.27 m MV E velocity: 102.00 cm/s  Systemic Diam: 2.00 cm MV A velocity: 74.10 cm/s MV E/A ratio:  1.38 Kristeen MissPhilip Nahser MD Electronically signed by Kristeen MissPhilip Nahser MD Signature Date/Time: 06/20/2021/4:07:03 PM    Final         Scheduled Meds:  diltiazem  120 mg Oral Daily   enoxaparin (LOVENOX) injection  95 mg Subcutaneous Q12H   fluticasone furoate-vilanterol  1 puff Inhalation Daily   And   umeclidinium bromide  1 puff Inhalation Daily   furosemide  40 mg Intravenous BID   gabapentin  300 mg Oral QHS   insulin aspart  0-9 Units Subcutaneous TID WC   isosorbide mononitrate  30 mg Oral Daily   morphine  30 mg Oral Q12H   pantoprazole  40 mg Oral QAC breakfast   predniSONE  10 mg Oral Q breakfast   ranolazine  500 mg Oral BID   rosuvastatin  10 mg Oral Daily   sodium chloride flush  3 mL Intravenous Q12H   spironolactone  25 mg Oral Daily   Continuous Infusions:    Assessment & Plan:   Principal Problem:   Multiple subsegmental pulmonary emboli without acute cor pulmonale (HCC) Active Problems:   CAD (coronary artery disease)   Hyperlipidemia   Essential hypertension   Paroxysmal A-fib (HCC)   Acute respiratory failure with hypoxia (HCC)   Acute on chronic heart failure with preserved ejection fraction (HFpEF) (HCC)   Chronic obstructive pulmonary disease (HCC)   Controlled type 2  diabetes mellitus with diabetic nephropathy (HCC)   Anxiety   Acute pulmonary embolism Acute respiratory failure with hypoxia Acute resp. Failure due to copd exac, acute chf, and PE 3/2 heparin drip was switched to Lovenox due to bleed from IV site overnight Discussed Coumadin with patient and will consult pharmacy for Coumadin initiation since failed Xarelto Hematologist input was appreciated Will obtain CT of abdomen and pelvis to rule out malignancy since had PE while on Xarelto Check venous Doppler Continue diuresis with IV Lasix   Atrial fibrillation Cardiology following Currently sinus rhythm, with multiple symptomatic episodes of A-fib benefits from rhythm control Considering Multaq 400 mg twice daily Continue diltiazem Started on Coumadin  CAD with stable Angina Continue Imdur, Ranexa and statins  Holding beta-blockers as above   Acute on chronic HFpEF Echo  with normal EF BNP was elevated from baseline Holding beta-blockers as above Continue IV Lasix, Aldactone and Imdur I/O , daily weight  Leukocytosis Possibly reactive Afebrile improving  Elevated Troponin Nml ef , no wma     Hypertension - Continue diltiazem, Imdur, spironolactone  Hyperlipidemia - Continue home rosuvastatin   COPD - Replace home Trelegy with formulary Breo and Incruse - As needed DuoNebs - Continue Daily Prednisone  Chronic pain - Continue home morphine and as needed oxycodone -Will need to confirm if he takes lubiprostone, Movantik or both, resume when confirmed by pharmacy   Anxiety Insomnia - Continue home as needed Klonopin - Continue home trazodone,    DM  - SSI   DVT prophylaxis: lovenox Code Status:full Family Communication: none at bedside Disposition Plan:  Status is: Inpatient Remains inpatient appropriate because: IV treatment.                 LOS: 1 day  35 min with >50% on coc    Lynn Ito, MD [facility] Pager [phone]  [phone]  If 7PM-7AM, please contact night-coverage 06/21/2021, 8:20 AM

## 2021-06-21 NOTE — Progress Notes (Signed)
Subjective:  ?Breathing improved today.  Down to 9 L HFNC, oxygen saturation >90%. ? ?Objective:  ?Vital Signs in the last 24 hours: ?Temp:  [98.1 ?F (36.7 ?C)-98.7 ?F (37.1 ?C)] 98.7 ?F (37.1 ?C) (03/02 0503) ?Pulse Rate:  [63-84] 84 (03/02 0503) ?Resp:  [18-29] 22 (03/02 0503) ?BP: (111-146)/(68-92) 146/75 (03/02 0503) ?SpO2:  [88 %-100 %] 96 % (03/02 0503) ?FiO2 (%):  [65 %] 65 % (03/01 0850) ?Weight:  [94.2 kg] 94.2 kg (03/02 0503) ? ?Intake/Output from previous day: ?03/01 0701 - 03/02 0700 ?In: [id number] [P.O.:1015] ?Out: 2400 [Urine:2400] ? ?Physical Exam ?Vitals and nursing note reviewed.  ?Constitutional:   ?   General: He is not in acute distress. ?Neck:  ?   Vascular: No JVD.  ?Cardiovascular:  ?   Rate and Rhythm: Normal rate and regular rhythm.  ?   Heart sounds: Normal heart sounds. No murmur heard. ?Pulmonary:  ?   Effort: Pulmonary effort is normal.  ?   Breath sounds: Wheezing (Improved compared to 06/20/2021) present. No rales.  ?Musculoskeletal:  ?   Right lower leg: No edema.  ?   Left lower leg: No edema.  ? ? ?Cardiac Studies: ? ?Telemetry 06/21/2021: ?Sinus rhythm ?First degree AV block ? ?EKG: ?Pending today ? ?Echocardiogram 3/1/02023: ?1. Left ventricular ejection fraction, by estimation, is 60 to 65%. The  ?left ventricle has normal function. The left ventricle has no regional  ?wall motion abnormalities. There is mild left ventricular hypertrophy.  ?Left ventricular diastolic parameters  ?are indeterminate.  ? 2. Right ventricular systolic function is moderately reduced. The right  ?ventricular size is normal.  ? 3. Left atrial size was severely dilated.  ? 4. Right atrial size was moderately dilated.  ? 5. The mitral valve is grossly normal. Mild mitral valve regurgitation.  ? 6. The aortic valve is grossly normal. Aortic valve regurgitation is  ?trivial.  ? ? ? ?Imaging/tests reviewed and independently interpreted: ?Echocardiogram 06/20/2021 ?Telemetry 06/20/2021 ?CT chest 06/20/2021 ? ? ?Assessment &  Recommendations: ? ?66 y/o Caucasian male with hypertension, paroxysmal Afib, moderate nonobstructive coronary artery disease (cath 2012), COPD, active smoker, prior h/o polysubstance abuse (EtOH, cocaine), admitted with acute on chronic hypoxic respiratory failure, Afib w/RVR-now converted to sinus rhythm, bilateral subsegmental PE ? ?Acute on chronic hypoxic respiratory failure: ?Likely multifactorial.Initially felt to be due to Afib, but he has been in sinus rhythm since cardioversion on 06/19/2021. ?Bilateral subsegmental PE.Mod RV dilatation new, could be due to PE as well as cor pulmonale from COPD. No rV strain on CT chest 06/20/2021.  ?EF normal on echocardiogram 06/20/2021, indeterminate diastolic function. ?Diuresed 2.4 L over 24 hours correlating with improvement in respiratory status suggesting component of HFPEF-now improving ?Continue IV Lasix 40 mg daily for 1 more day.  Can be switched to oral Lasix on 06/23/2018 2340 mg twice daily. ?Continue spironlactone 25 mg daily ? ?Paroxysmal atrial fibrillation: ?Currently in sinus rhythm since cardioversion on 06/19/2021. ?He has had multiple symptomatic episodes of atrial fibrillation, therefore will benefit from rhythm control therapy. ?He has known coronary artery disease.  While he does have HFpEF, systolic function is preserved. ?I do think Multaq 400 mg twice daily will be a safe and effective medication for him, which has maintained sinus rhythm in the past. ?He has enough samples for 3 more days, my office is working on patient assistance, and Effexor.  We will follow-up. ?Continue diltiazem 120 mg daily. ?CHA2DS2VAsc score 4, annual stroke risk 5% ?Previously on Xarelto 20  mg daily, but had concerns re: compliance. ?May need PE dose Xarelto for now. ? ?CAD: ?No angina symptoms at present. ?Not on Aspirin due to ongoing use of Xarelto. ?Continue Imdur, diltiazem, Ranexa, statin ?Check lipid panel ? ? ?Discussed interpretation of tests and management  recommendations with the primary team ? ? ?[name], MD ?Pager: [phone] ?Office: [phone] ? ?

## 2021-06-21 NOTE — Progress Notes (Signed)
Patient continues to refuse CBG checks. ?

## 2021-06-21 NOTE — Telephone Encounter (Signed)
Scheduled appointment per 03/02 staff message. Left message with details of appointments and my direct number. Patient is in the hospital, so they will have appointment on discharge papers.  ?

## 2021-06-21 NOTE — Progress Notes (Signed)
ANTICOAGULATION CONSULT NOTE - Initial Consult ? ?Pharmacy Consult for Lovenox, Coumadin ?Indication:  afib, PE ? ?Allergies  ?Allergen Reactions  ? Etomidate Anxiety  ? Tape Other (See Comments)  ?  SKIN WILL TEAR EASILY!!!!  ? ? ?Patient Measurements: ?Weight: 94.2 kg (207 lb 9.6 oz) ? ?Vital Signs: ?Temp: 97.9 ?F (36.6 ?C) (03/02 [id number]) ?Temp Source: Oral (03/02 [id number]) ?BP: 113/64 (03/02 1029) ?Pulse Rate: 73 (03/02 0839) ? ?Labs: ?Recent Labs  ?  06/19/21 ?1519 06/19/21 ?1711 06/20/21 ?0425 06/20/21 ?1512 06/21/21 ?0449  ?HGB 12.2*  --  13.8  --  12.9*  ?HCT 37.2*  --  41.5  --  37.7*  ?PLT 146*  --  163  --  146*  ?APTT  --   --  56* 52*  --   ?HEPARINUNFRC  --   --  >1.10*  --   --   ?CREATININE 0.92  --  0.88  --  1.06  ?TROPONINIHS 24* 30*  --   --   --   ? ? ?Estimated Creatinine Clearance: 83.6 mL/min (by C-G formula based on SCr of 1.06 mg/dL). ? ? ?Medical History: ?Past Medical History:  ?Diagnosis Date  ? Acute respiratory distress 02/22/2015  ? Atrial fibrillation with RVR ([place]) 04/19/12  ? New onset; spontaneous conversion to NSR; Pradaxa anticoagulation  ? CAD (coronary artery disease)   ? a. Cath 2008- nonobstructive, mod LAD, mild LCx & RCA disease b. Cardiolite 2010 normal c. 03/2012 abnormal Lexiscan Myoview attributed to cocaine d. 03/2012 echo: EF 55-60%, moderate LVH, moderate LA dilatation  ? Cocaine abuse ([place])   ? DM type 2 (diabetes mellitus, type 2) ([place])   ? ETOH abuse   ? 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 Elevated LFTs in the setting of acute EtOH abuse, no acute findings on abdominal u/s, HCV+, suspected acute alcohilic heptatitis  ? HTN (hypertension)   ? Hypercholesteremia   ? PAF (paroxysmal atrial fibrillation) ([place]) 02/22/2015  ? Tobacco abuse   ? ? ?Assessment: ? ?Anticoag: Heparin for new bL subsegmental PE, hx afib (PTA xarelto, LD 2/27 PM). CHADS2VASC 4, ?- 3/1 PM: IV site bleeding with heparin infusion. Pt refusing another IV site tonight. ?- Hgb 12.9 stable. Plts only 146, No  further bleeding this AM but IV site yet to be replaced. Start Coumadin 3/2. No baseline INR. Monitor for INR interaction with dronedarone. ? ?Goal of Therapy:  ?INR 2-3 ?Monitor platelets by anticoagulation protocol: Yes ?  ?Plan:  ?Lovenox 95mg /12h ?Start Coumadin 7.5mg  po x 1 tonight.  ?Daily INR, CBC q72h ? ? ?Sanaiyah Kirchhoff S. Alford Highland, PharmD, BCPS ?Clinical Staff Pharmacist [email or web address] ?Alford Highland, [employer] ?06/21/2021,10:43 AM ? ? ?

## 2021-06-21 NOTE — Progress Notes (Signed)
Patient complains of 10/10 chronic pain in his back.  Patient requested his home dose because the 5mg  of oxy was not effective last night.  MD notified.  Order placed for additional 5mg  of oxy to give with the 5mg  prn oxy plus prn tylenol.  Patient to address with day team in the AM.  ?

## 2021-06-22 ENCOUNTER — Other Ambulatory Visit (HOSPITAL_COMMUNITY): Payer: Self-pay

## 2021-06-22 DIAGNOSIS — I2694 Multiple subsegmental pulmonary emboli without acute cor pulmonale: Secondary | ICD-10-CM | POA: Diagnosis not present

## 2021-06-22 LAB — CBC
HCT: 36.7 % — ABNORMAL LOW (ref 39.0–52.0)
Hemoglobin: 12.4 g/dL — ABNORMAL LOW (ref 13.0–17.0)
MCH: 34.7 pg — ABNORMAL HIGH (ref 26.0–34.0)
MCHC: 33.8 g/dL (ref 30.0–36.0)
MCV: 102.8 fL — ABNORMAL HIGH (ref 80.0–100.0)
Platelets: 136 10*3/uL — ABNORMAL LOW (ref 150–400)
RBC: 3.57 MIL/uL — ABNORMAL LOW (ref 4.22–5.81)
RDW: 14.1 % (ref 11.5–15.5)
WBC: 10.6 10*3/uL — ABNORMAL HIGH (ref 4.0–10.5)
nRBC: 0 % (ref 0.0–0.2)

## 2021-06-22 LAB — PROTIME-INR
INR: 1.1 (ref 0.8–1.2)
Prothrombin Time: 14.4 seconds (ref 11.4–15.2)

## 2021-06-22 MED ORDER — WARFARIN SODIUM 7.5 MG PO TABS
7.5000 mg | ORAL_TABLET | Freq: Once | ORAL | Status: AC
  Administered 2021-06-22: 7.5 mg via ORAL
  Filled 2021-06-22: qty 1

## 2021-06-22 MED ORDER — DRONEDARONE HCL 400 MG PO TABS
400.0000 mg | ORAL_TABLET | Freq: Two times a day (BID) | ORAL | 2 refills | Status: AC
  Filled 2021-06-22: qty 60, 30d supply, fill #0

## 2021-06-22 MED ORDER — FUROSEMIDE 40 MG PO TABS: 40.0000 mg | ORAL_TABLET | Freq: Every day | ORAL | Status: DC

## 2021-06-22 MED ORDER — FUROSEMIDE 40 MG PO TABS
40.0000 mg | ORAL_TABLET | Freq: Two times a day (BID) | ORAL | Status: DC
  Administered 2021-06-22 – 2021-06-25 (×7): 40 mg via ORAL
  Filled 2021-06-22 (×7): qty 1

## 2021-06-22 NOTE — Progress Notes (Signed)
PROGRESS NOTE    Kevin Orozco  [id number] DOB: Mar 26, 1956 DOA: 06/19/2021 PCP: [name], NP    Brief Narrative:  Kevin Orozco is a 66 y.o. male with medical history significant of substance use, atrial fibrillation, hyperlipidemia, hypertension, diabetes, claudication, COPD, CHF, CAD, hepatitis C, chronic pain presenting with new pain and shortness of breath.   3/1 found with PE. Was on 15L , placed on bipap this am.  3/2 on HF , reports feeling better. Overnight issues where heparin gtt addressed and was switched to lovenox. Discussed with patient about Coumadin, INR checks, risk and possible complications including hemorrhagic head bleed and GI bleed and he verbalizes understanding.  Discussed with patient that he may require home oxygen and he refused to go home with oxygen.  Even when I tried to explain why the oxygen is important for him he still refuses.  Consultants:  Hematology, cardiology, PCCm  Procedures:   Antimicrobials:      Subjective: Not much interaction today, in foul mood. But does endorse feeling little better.   Objective: Vitals:   06/21/21 2322 06/22/21 0148 06/22/21 0437 06/22/21 0736  BP:   122/65 117/72  Pulse: 73  75 81  Resp:   (!) 21 20  Temp:   98.5 F (36.9 C) 98.4 F (36.9 C)  TempSrc:   Oral   SpO2: 92%  90% 92%  Weight:  91.3 kg      Intake/Output Summary (Last 24 hours) at 06/22/2021 0812 Last data filed at 06/22/2021 [id number] Gross per 24 hour  Intake 866 ml  Output 3300 ml  Net -2434 ml   Filed Weights   06/19/21 2215 06/21/21 0503 06/22/21 0148  Weight: 93 kg 94.2 kg 91.3 kg    Calm, NAD Decrease bs , no wheezing Reg s1/s2 no gallop Soft benign +bs No edema Aaoxox3  Mood and affect appropriate in current setting    Data Reviewed: I have personally reviewed following labs and imaging studies  CBC: Recent Labs  Lab 06/19/21 1519 06/20/21 0425 06/21/21 0449  WBC 9.7 14.3* 11.2*  HGB 12.2* 13.8 12.9*  HCT  37.2* 41.5 37.7*  MCV 107.8* 107.5* 103.6*  PLT 146* 163 123456*   Basic Metabolic Panel: Recent Labs  Lab 06/19/21 1519 06/19/21 1711 06/20/21 0425 06/21/21 0449  NA 135  --  137 136  K 3.9  --  4.1 3.8  CL 103  --  105 100  CO2 23  --  21* 24  GLUCOSE 137*  --  81 83  BUN 19  --  21 20  CREATININE 0.92  --  0.88 1.06  CALCIUM 8.6*  --  8.5* 8.6*  MG  --  2.1  --   --    GFR: Estimated Creatinine Clearance: 82.4 mL/min (by C-G formula based on SCr of 1.06 mg/dL). Liver Function Tests: No results for input(s): AST, ALT, ALKPHOS, BILITOT, PROT, ALBUMIN in the last 168 hours. No results for input(s): LIPASE, AMYLASE in the last 168 hours. No results for input(s): AMMONIA in the last 168 hours. Coagulation Profile: No results for input(s): INR, PROTIME in the last 168 hours. Cardiac Enzymes: No results for input(s): CKTOTAL, CKMB, CKMBINDEX, TROPONINI in the last 168 hours. BNP (last 3 results) No results for input(s): PROBNP in the last 8760 hours. HbA1C: No results for input(s): HGBA1C in the last 72 hours. CBG: Recent Labs  Lab 06/20/21 0735 06/20/21 1152 06/20/21 1728  GLUCAP 90 107* 104*   Lipid Profile: Recent Labs  06/21/21 0449  CHOL 83  HDL 37*  LDLCALC 39  TRIG 37  CHOLHDL 2.2   Thyroid Function Tests: No results for input(s): TSH, T4TOTAL, FREET4, T3FREE, THYROIDAB in the last 72 hours. Anemia Panel: No results for input(s): VITAMINB12, FOLATE, FERRITIN, TIBC, IRON, RETICCTPCT in the last 72 hours. Sepsis Labs: No results for input(s): PROCALCITON, LATICACIDVEN in the last 168 hours.  Recent Results (from the past 240 hour(s))  Resp Panel by RT-PCR (Flu A&B, Covid) Nasopharyngeal Swab     Status: None   Collection Time: 06/19/21 10:29 PM   Specimen: Nasopharyngeal Swab; Nasopharyngeal(NP) swabs in vial transport medium  Result Value Ref Range Status   SARS Coronavirus 2 by RT PCR NEGATIVE NEGATIVE Final    Comment: (NOTE) SARS-CoV-2 target  nucleic acids are NOT DETECTED.  The SARS-CoV-2 RNA is generally detectable in upper respiratory specimens during the acute phase of infection. The lowest concentration of SARS-CoV-2 viral copies this assay can detect is 138 copies/mL. A negative result does not preclude SARS-Cov-2 infection and should not be used as the sole basis for treatment or other patient management decisions. A negative result may occur with  improper specimen collection/handling, submission of specimen other than nasopharyngeal swab, presence of viral mutation(s) within the areas targeted by this assay, and inadequate number of viral copies(<138 copies/mL). A negative result must be combined with clinical observations, patient history, and epidemiological information. The expected result is Negative.  Fact Sheet for Patients:  EntrepreneurPulse.com.au  Fact Sheet for Healthcare Providers:  IncredibleEmployment.be  This test is no t yet approved or cleared by the [place] FDA and  has been authorized for detection and/or diagnosis of SARS-CoV-2 by FDA under an Emergency Use Authorization (EUA). This EUA will remain  in effect (meaning this test can be used) for the duration of the COVID-19 declaration under Section 564(b)(1) of the Act, 21 U.S.C.section 360bbb-3(b)(1), unless the authorization is terminated  or revoked sooner.       Influenza A by PCR NEGATIVE NEGATIVE Final   Influenza B by PCR NEGATIVE NEGATIVE Final    Comment: (NOTE) The Xpert Xpress SARS-CoV-2/FLU/RSV plus assay is intended as an aid in the diagnosis of influenza from Nasopharyngeal swab specimens and should not be used as a sole basis for treatment. Nasal washings and aspirates are unacceptable for Xpert Xpress SARS-CoV-2/FLU/RSV testing.  Fact Sheet for Patients: EntrepreneurPulse.com.au  Fact Sheet for Healthcare  Providers: IncredibleEmployment.be  This test is not yet approved or cleared by the [place] FDA and has been authorized for detection and/or diagnosis of SARS-CoV-2 by FDA under an Emergency Use Authorization (EUA). This EUA will remain in effect (meaning this test can be used) for the duration of the COVID-19 declaration under Section 564(b)(1) of the Act, 21 U.S.C. section 360bbb-3(b)(1), unless the authorization is terminated or revoked.  Performed at [facility], [place] [address], [place], [place] [address]          Radiology Studies: CT ABDOMEN PELVIS W CONTRAST  Result Date: 06/21/2021 CLINICAL DATA:  Evaluate for occult malignancy EXAM: CT ABDOMEN AND PELVIS WITH CONTRAST TECHNIQUE: Multidetector CT imaging of the abdomen and pelvis was performed using the standard protocol following bolus administration of intravenous contrast. RADIATION DOSE REDUCTION: This exam was performed according to the departmental dose-optimization program which includes automated exposure control, adjustment of the mA and/or kV according to patient size and/or use of iterative reconstruction technique. CONTRAST:  183mL OMNIPAQUE IOHEXOL 300 MG/ML  SOLN COMPARISON:  Sonogram  done on 07/09/2017 FINDINGS: Lower chest: Heart is enlarged in size. Small bilateral pleural effusions are seen, more so on the right side. There are patchy infiltrates in the posterior lower lung fields, possibly atelectasis. Possibility of pneumonia is not excluded. Coronary artery calcifications are seen. There is dense pericardial calcification. Hepatobiliary: There is nodularity in the liver surface. No focal abnormality is seen. Gallbladder is unremarkable. Pancreas: No focal abnormality is seen. Spleen: Unremarkable. Adrenals/Urinary Tract: Adrenals are not enlarged. There is no hydronephrosis. There are scattered calcifications in the renal artery branches. There are multiple smooth marginated  low-density lesions in the renal cortex largest measuring 4.3 cm in the upper pole of left kidney. Ureters are not dilated. Urinary bladder is unremarkable. Stomach/Bowel: Small hiatal hernia is seen. Stomach is not distended. Small bowel loops are not dilated. Appendix is not seen. There is no focal pericecal inflammation. There is no significant wall thickening in colon. There is no pericolic stranding or fluid collection. Vascular/Lymphatic: Dense calcifications are seen in the aorta and its major branches. Reproductive: Unremarkable. Other: There is no ascites or pneumoperitoneum. Bilateral inguinal hernias containing fat are seen, larger on the right side. Umbilical and paraumbilical hernias containing fat are noted. There is small ventral hernia containing fat slightly superior to the umbilicus. Musculoskeletal: Degenerative changes are noted in the lumbar spine with encroachment of neural foramina at multiple levels, more so at L4-L5 and L5-S1 levels. Schmorl's nodes are seen in the multiple vertebral bodies. IMPRESSION: There is no evidence of intestinal obstruction or pneumoperitoneum. There is no hydronephrosis. Bilateral pleural effusions, more so on the right side. Infiltrates in the both lower lung fields suggest atelectasis or pneumonia. Nodularity in the liver surface suggests possible cirrhosis. Small hiatal hernia. Bilateral renal cysts. Other findings as described in the body of the report. Electronically Signed   By: Ernie Avena M.D.   On: 06/21/2021 12:35   ECHOCARDIOGRAM COMPLETE  Result Date: 06/20/2021    ECHOCARDIOGRAM REPORT   Patient Name:   Kevin Orozco Date of Exam: 06/20/2021 Medical Rec #:  [id number]      Height:       71.0 in Accession #:    [id number]     Weight:       205.0 lb Date of Birth:  Mar 12, 1956      BSA:          2.131 m Patient Age:    65 years       BP:           132/81 mmHg Patient Gender: M              HR:           67 bpm. Exam Location:  Inpatient  Procedure: 2D Echo Indications:    Acute respiratory distress  History:        Patient has prior history of Echocardiogram examinations, most                 recent 02/13/2021. CAD; Risk Factors:Diabetes and Hypertension.  Sonographer:    Devonne Doughty Referring Phys: [id number] CELESTE C CANTWELL  Sonographer Comments: Image acquisition challenging due to patient body habitus and supine. IMPRESSIONS  1. Left ventricular ejection fraction, by estimation, is 60 to 65%. The left ventricle has normal function. The left ventricle has no regional wall motion abnormalities. There is mild left ventricular hypertrophy. Left ventricular diastolic parameters are indeterminate.  2. Right ventricular systolic function is moderately reduced. The right ventricular size is  normal.  3. Left atrial size was severely dilated.  4. Right atrial size was moderately dilated.  5. The mitral valve is grossly normal. Mild mitral valve regurgitation.  6. The aortic valve is grossly normal. Aortic valve regurgitation is trivial. FINDINGS  Left Ventricle: Left ventricular ejection fraction, by estimation, is 60 to 65%. The left ventricle has normal function. The left ventricle has no regional wall motion abnormalities. The left ventricular internal cavity size was normal in size. There is  mild left ventricular hypertrophy. Left ventricular diastolic parameters are indeterminate. Right Ventricle: The right ventricular size is normal. Right vetricular wall thickness was not well visualized. Right ventricular systolic function is moderately reduced. Left Atrium: Left atrial size was severely dilated. Right Atrium: Right atrial size was moderately dilated. Pericardium: There is no evidence of pericardial effusion. Mitral Valve: The mitral valve is grossly normal. Mild mitral valve regurgitation. Tricuspid Valve: The tricuspid valve is grossly normal. Tricuspid valve regurgitation is not demonstrated. Aortic Valve: The aortic valve is grossly normal.  Aortic valve regurgitation is trivial. Aortic regurgitation PHT measures 535 msec. Aortic valve mean gradient measures 3.0 mmHg. Aortic valve peak gradient measures 5.7 mmHg. Aortic valve area, by VTI measures 3.21 cm. Pulmonic Valve: The pulmonic valve was not well visualized. Pulmonic valve regurgitation is not visualized. Aorta: The aortic root and ascending aorta are structurally normal, with no evidence of dilitation. IAS/Shunts: The atrial septum is grossly normal.  LEFT VENTRICLE PLAX 2D LVIDd:         4.30 cm   Diastology LVIDs:         3.00 cm   LV e' medial:    7.72 cm/s LV PW:         1.30 cm   LV E/e' medial:  13.2 LV IVS:        1.30 cm   LV e' lateral:   8.81 cm/s LVOT diam:     2.00 cm   LV E/e' lateral: 11.6 LV SV:         84 LV SV Index:   39 LVOT Area:     3.14 cm  RIGHT VENTRICLE             IVC RV Basal diam:  3.70 cm     IVC diam: 2.70 cm RV Mid diam:    2.90 cm RV S prime:     10.10 cm/s TAPSE (M-mode): 1.2 cm LEFT ATRIUM              Index        RIGHT ATRIUM           Index LA diam:        4.50 cm  2.11 cm/m   RA Area:     25.20 cm LA Vol (A2C):   120.0 ml 56.32 ml/m  RA Volume:   81.00 ml  38.01 ml/m LA Vol (A4C):   90.8 ml  42.61 ml/m LA Biplane Vol: 106.0 ml 49.75 ml/m  AORTIC VALVE AV Area (Vmax):    3.25 cm AV Area (Vmean):   3.09 cm AV Area (VTI):     3.21 cm AV Vmax:           119.00 cm/s AV Vmean:          84.500 cm/s AV VTI:            0.260 m AV Peak Grad:      5.7 mmHg AV Mean Grad:      3.0 mmHg  LVOT Vmax:         123.00 cm/s LVOT Vmean:        83.200 cm/s LVOT VTI:          0.266 m LVOT/AV VTI ratio: 1.02 AI PHT:            535 msec  AORTA Ao Root diam: 3.70 cm Ao Asc diam:  3.80 cm MITRAL VALVE MV Area (PHT): 3.53 cm     SHUNTS MV Decel Time: 215 msec     Systemic VTI:  0.27 m MV E velocity: 102.00 cm/s  Systemic Diam: 2.00 cm MV A velocity: 74.10 cm/s MV E/A ratio:  1.38 Mertie Moores MD Electronically signed by Mertie Moores MD Signature Date/Time: 06/20/2021/4:07:03  PM    Final    VAS [name] LOWER EXTREMITY VENOUS (DVT)  Result Date: 06/21/2021  Lower Venous DVT Study Patient Name:  Kevin Orozco  Date of Exam:   06/21/2021 Medical Rec #: [id number]       Accession #:    [id number] Date of Birth: April 07, 1956       Patient Gender: M Patient Age:   32 years Exam Location:  [facility] Procedure:      VAS [name] LOWER EXTREMITY VENOUS (DVT) Referring Phys: [name] --------------------------------------------------------------------------------  Indications: Pulmonary embolism.  Comparison Study: no prior Performing Technologist: Archie Patten RVS  Examination Guidelines: A complete evaluation includes B-mode imaging, spectral Doppler, color Doppler, and power Doppler as needed of all accessible portions of each vessel. Bilateral testing is considered an integral part of a complete examination. Limited examinations for reoccurring indications may be performed as noted. The reflux portion of the exam is performed with the patient in reverse Trendelenburg.  +---------+---------------+---------+-----------+----------+--------------+  RIGHT     Compressibility Phasicity Spontaneity Properties Thrombus Aging  +---------+---------------+---------+-----------+----------+--------------+  CFV       Full            Yes       Yes                                    +---------+---------------+---------+-----------+----------+--------------+  SFJ       Full                                                             +---------+---------------+---------+-----------+----------+--------------+  FV Prox   Full                                                             +---------+---------------+---------+-----------+----------+--------------+  FV Mid    Full                                                             +---------+---------------+---------+-----------+----------+--------------+  FV Distal Full                                                              +---------+---------------+---------+-----------+----------+--------------+  PFV       Full                                                             +---------+---------------+---------+-----------+----------+--------------+  POP       Full            Yes       Yes                                    +---------+---------------+---------+-----------+----------+--------------+  PTV       Full                                                             +---------+---------------+---------+-----------+----------+--------------+  PERO      Full                                                             +---------+---------------+---------+-----------+----------+--------------+   +---------+---------------+---------+-----------+----------+--------------+  LEFT      Compressibility Phasicity Spontaneity Properties Thrombus Aging  +---------+---------------+---------+-----------+----------+--------------+  CFV       Full            Yes       Yes                                    +---------+---------------+---------+-----------+----------+--------------+  SFJ       Full                                                             +---------+---------------+---------+-----------+----------+--------------+  FV Prox   Full                                                             +---------+---------------+---------+-----------+----------+--------------+  FV Mid    Full                                                             +---------+---------------+---------+-----------+----------+--------------+  FV Distal Full                                                             +---------+---------------+---------+-----------+----------+--------------+  PFV       Full                                                             +---------+---------------+---------+-----------+----------+--------------+  POP       Full            Yes       Yes                                     +---------+---------------+---------+-----------+----------+--------------+  PTV       Full                                                             +---------+---------------+---------+-----------+----------+--------------+  PERO      Full                                                             +---------+---------------+---------+-----------+----------+--------------+     Summary: BILATERAL: - No evidence of deep vein thrombosis seen in the lower extremities, bilaterally. -No evidence of popliteal cyst, bilaterally.   *See table(s) above for measurements and observations. Electronically signed by Jamelle Haring on 06/21/2021 at 3:46:19 PM.    Final         Scheduled Meds:  diltiazem  120 mg Oral Daily   dronedarone  400 mg Oral BID WC   enoxaparin (LOVENOX) injection  95 mg Subcutaneous Q12H   fluticasone furoate-vilanterol  1 puff Inhalation Daily   And   umeclidinium bromide  1 puff Inhalation Daily   furosemide  40 mg Intravenous BID   gabapentin  300 mg Oral QHS   insulin aspart  0-9 Units Subcutaneous TID WC   isosorbide mononitrate  30 mg Oral Daily   morphine  30 mg Oral Q12H   pantoprazole  40 mg Oral QAC breakfast   predniSONE  10 mg Oral Q breakfast   ranolazine  500 mg Oral BID   rosuvastatin  10 mg Oral Daily   sodium chloride flush  3 mL Intravenous Q12H   spironolactone  25 mg Oral Daily   Warfarin - Pharmacist Dosing Inpatient   Does not apply q1600   Continuous Infusions:    Assessment & Plan:   Principal Problem:   Multiple subsegmental pulmonary emboli without acute cor pulmonale (HCC) Active Problems:   Paroxysmal A-fib (HCC)   Essential hypertension   CAD (coronary artery disease)   Hyperlipidemia   Acute respiratory failure with hypoxia (HCC)   Acute on chronic heart failure with preserved ejection fraction (HCC)   Chronic obstructive pulmonary disease ([place])   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HCC)   Anxiety   Acute  pulmonary embolism Acute respiratory failure with hypoxia Acute resp. Failure due to copd exac, acute chf, and PE 3/2 heparin drip was switched to  Lovenox due to bleed from IV site overnight Discussed Coumadin with patient and will consult pharmacy for Coumadin initiation since failed Xarelto Hematologist input was appreciated 3/3 CT  a/p without evidence of malignancy  Venous ultrasound negative for DVT  Continue Coumadin until INR is between 2-3  Continue Lovenox  Iv lasix to po     Atrial fibrillation Cardiology following Currently sinus rhythm, with multiple symptomatic episodes of A-fib benefits from rhythm control 3/3 continue multaq, insurance approved it and RX to [place] Continue coumadin as above Continue diltiazem Needs to follow-up with cardiology as outpatient   CAD with stable Angina Continue Imdur, Ranexa and statins  Holding beta-blockers     Acute on chronic HFpEF Echo with normal EF BNP was elevated from baseline Holding beta-blockers as above 3/3 improving clinicaly Diuresed well.  Changing iv lasix to po 40mg  bid Continue spironlactone   Leukocytosis Likely reactive Improving Afebrile  Elevated Troponin Nml ef , no wma     Hypertension - Continue diltiazem, Imdur, spironolactone  Hyperlipidemia - Continue home rosuvastatin   COPD - Replace home Trelegy with formulary Breo and Incruse - As needed DuoNebs - Continue Daily Prednisone  Chronic pain - Continue home morphine and as needed oxycodone -Will need to confirm if he takes lubiprostone, Movantik or both, resume when confirmed by pharmacy   Anxiety Insomnia - Continue home as needed Klonopin - Continue home trazodone,    DM  - SSI   DVT prophylaxis: lovenox Code Status:full Family Communication: none at bedside Disposition Plan:  Status is: Inpatient Remains inpatient appropriate because: IV treatment. Needs inr to be therapeutic since has b/l  PE                LOS: 2 days   Time spent:35 min with >50% on coc    [name], MD [facility] Pager [phone]  If 7PM-7AM, please contact night-coverage 06/22/2021, 8:12 AM

## 2021-06-22 NOTE — Discharge Instructions (Signed)
Information on my medicine - Coumadin   (Warfarin)  Why was Coumadin prescribed for you? Coumadin was prescribed for you because you have a blood clot or a medical condition that can cause an increased risk of forming blood clots. Blood clots can cause serious health problems by blocking the flow of blood to the heart, lung, or brain. Coumadin can prevent harmful blood clots from forming. As a reminder your indication for Coumadin is:  Pulmonary Embolism treatment  What test will check on my response to Coumadin? While on Coumadin (warfarin) you will need to have an INR test regularly to ensure that your dose is keeping you in the desired range. The INR (international normalized ratio) number is calculated from the result of the laboratory test called prothrombin time (PT).  If an INR APPOINTMENT HAS NOT ALREADY BEEN MADE FOR YOU please schedule an appointment to have this lab work done by your health care provider within 7 days. Your INR goal is usually a number between:  2 to 3 or your provider may give you a more narrow range like 2-2.5.  Ask your health care provider during an office visit what your goal INR is.  What  do you need to  know  About  COUMADIN? Take Coumadin (warfarin) exactly as prescribed by your healthcare provider about the same time each day.  DO NOT stop taking without talking to the doctor who prescribed the medication.  Stopping without other blood clot prevention medication to take the place of Coumadin may increase your risk of developing a new clot or stroke.  Get refills before you run out.  What do you do if you miss a dose? If you miss a dose, take it as soon as you remember on the same day then continue your regularly scheduled regimen the next day.  Do not take two doses of Coumadin at the same time.  Important Safety Information A possible side effect of Coumadin (Warfarin) is an increased risk of bleeding. You should call your healthcare provider right away if  you experience any of the following: Bleeding from an injury or your nose that does not stop. Unusual colored urine (red or dark brown) or unusual colored stools (red or black). Unusual bruising for unknown reasons. A serious fall or if you hit your head (even if there is no bleeding).  Some foods or medicines interact with Coumadin (warfarin) and might alter your response to warfarin. To help avoid this: Eat a balanced diet, maintaining a consistent amount of Vitamin K. Notify your provider about major diet changes you plan to make. Avoid alcohol or limit your intake to 1 drink for women and 2 drinks for men per day. (1 drink is 5 oz. wine, 12 oz. beer, or 1.5 oz. liquor.)  Make sure that ANY health care provider who prescribes medication for you knows that you are taking Coumadin (warfarin).  Also make sure the healthcare provider who is monitoring your Coumadin knows when you have started a new medication including herbals and non-prescription products.  Coumadin (Warfarin)  Major Drug Interactions  Increased Warfarin Effect Decreased Warfarin Effect  Alcohol (large quantities) Antibiotics (esp. Septra/Bactrim, Flagyl, Cipro) Amiodarone (Cordarone) Aspirin (ASA) Cimetidine (Tagamet) Megestrol (Megace) NSAIDs (ibuprofen, naproxen, etc.) Piroxicam (Feldene) Propafenone (Rythmol SR) Propranolol (Inderal) Isoniazid (INH) Posaconazole (Noxafil) Barbiturates (Phenobarbital) Carbamazepine (Tegretol) Chlordiazepoxide (Librium) Cholestyramine (Questran) Griseofulvin Oral Contraceptives Rifampin Sucralfate (Carafate) Vitamin K   Coumadin (Warfarin) Major Herbal Interactions  Increased Warfarin Effect Decreased Warfarin Effect  Garlic Ginseng   Ginkgo biloba Coenzyme Q10 Green tea St. John's wort    Coumadin (Warfarin) FOOD Interactions  Eat a consistent number of servings per week of foods HIGH in Vitamin K (1 serving =  cup)  Collards (cooked, or boiled & drained) Kale  (cooked, or boiled & drained) Mustard greens (cooked, or boiled & drained) Parsley *serving size only =  cup Spinach (cooked, or boiled & drained) Swiss chard (cooked, or boiled & drained) Turnip greens (cooked, or boiled & drained)  Eat a consistent number of servings per week of foods MEDIUM-HIGH in Vitamin K (1 serving = 1 cup)  Asparagus (cooked, or boiled & drained) Broccoli (cooked, boiled & drained, or raw & chopped) Brussel sprouts (cooked, or boiled & drained) *serving size only =  cup Lettuce, raw (green leaf, endive, romaine) Spinach, raw Turnip greens, raw & chopped   These websites have more information on Coumadin (warfarin):  www.coumadin.com; www.ahrq.gov/consumer/coumadin.htm;   

## 2021-06-22 NOTE — TOC Initial Note (Signed)
Transition of Care (TOC) - Initial/Assessment Note  ? ? ?Patient Details  ?Name: Kevin Orozco ?MRN: [id number] ?Date of Birth: March 10, 1956 ? ?Transition of Care (TOC) CM/SW Contact:    ?Zenon Mayo, RN ?Phone Number: ?06/22/2021, 1:35 PM ? ?Clinical Narrative:                 ?Patient is from home with friend Ricard Dillon, she does not drive.  He does not need any DME, he states he will call a taxi himself and pay for it himself at discharge. He has a PCP.  He states his copay for his medications is usually zero dollars. TOC has brought medications up for him on Friday, Staff Nurse has them put away.   ? ?Expected Discharge Plan: Home/Self Care ?Barriers to Discharge: Continued Medical Work up ? ? ?Patient Goals and CMS Choice ?Patient states their goals for this hospitalization and ongoing recovery are:: return home ?  ?Choice offered to / list presented to : NA ? ?Expected Discharge Plan and Services ?Expected Discharge Plan: Home/Self Care ?  ?Discharge Planning Services: CM Consult ?Post Acute Care Choice: NA ?Living arrangements for the past 2 months: [place] ?                ?  ?DME Agency: NA ?  ?  ?  ?HH Arranged: NA ?  ?  ?  ?  ? ?Prior Living Arrangements/Services ?Living arrangements for the past 2 months: [place] ?Lives with:: Friends ?Patient language and need for interpreter reviewed:: Yes ?Do you feel safe going back to the place where you live?: Yes      ?Need for Family Participation in Patient Care: Yes (Comment) ?Care giver support system in place?: Yes (comment) ?  ?Criminal Activity/Legal Involvement Pertinent to Current Situation/Hospitalization: No - Comment as needed ? ?Activities of Daily Living ?  ?  ? ?Permission Sought/Granted ?  ?  ?   ?   ?   ?   ? ?Emotional Assessment ?Appearance:: Appears stated age ?Attitude/Demeanor/Rapport: Engaged ?Affect (typically observed): Appropriate ?Orientation: : Oriented to Situation, Oriented to  Time, Oriented to Place,  Oriented to Self ?Alcohol / Substance Use: Not Applicable ?Psych Involvement: No (comment) ? ?Admission diagnosis:  Acute pulmonary embolism ([place]) [I26.99] ?Acute respiratory failure with hypoxia ([place]) [J96.01] ?Atrial flutter, unspecified type ([place]) [I48.92] ?Multiple subsegmental pulmonary emboli without acute cor pulmonale (HCC) [I26.94] ?Patient Active Problem List  ? Diagnosis Date Noted  ? Anxiety 06/20/2021  ? Multiple subsegmental pulmonary emboli without acute cor pulmonale ([place]) 06/19/2021  ? Acute CHF (congestive heart failure) ([place]) 02/18/2020  ? Hypokalemia 02/18/2020  ? Atrial fibrillation with RVR ([place]) 02/18/2020  ? Acute on chronic systolic CHF (congestive heart failure) ([place]) 02/18/2020  ? Chest pain 07/09/2019  ? Claudication ([place]) 09/07/2015  ? 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[place])   ? 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   ? 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[place])   ? Acute on chronic heart failure with preserved ejection fraction ([place])   ? Tobacco abuse   ? Polysubstance abuse ([place])   ? Marijuana abuse   ? Chronic obstructive pulmonary disease (HCC)   ? Acute respiratory failure with hypoxia and hypercapnia (HCC)   ? Hepatitis, viral   ? Acute respiratory failure with hypoxia ([place]) 02/22/2015  ? Cocaine abuse ([place]) 04/24/2012  ? Coronary vasospasm ([place]) 04/24/2012  ? HCV antibody positive  04/24/2012  ? Transaminitis 04/20/2012  ? CAD (coronary artery disease) 04/19/2012  ? Hyperlipidemia 04/19/2012  ? Paroxysmal A-fib ([place]) 04/19/2012  ? Essential hypertension   ? Alcohol abuse   ? ?PCP:  [name], NP ?Pharmacy:   ?[facility] ?[address] ?[place] [address] ?Phone: [phone] Fax: [phone] ? ?[facility] ?[address] ?[place] [address] ?Phone: [phone] Fax: [phone] ? ? ? ? ?Social  Determinants of Health (SDOH) Interventions ?  ? ?Readmission Risk Interventions ?Readmission Risk Prevention Plan 06/22/2021  ?Transportation Screening Complete  ?PCP or Specialist Appt within 3-5 Days Complete  ?[place] or Home Care Consult Complete  ?Social Work Consult for [place] Planning/Counseling Complete  ?Palliative Care Screening Not Applicable  ?Medication Review Press photographer) Complete  ?Some recent data might be hidden  ? ? ? ?

## 2021-06-22 NOTE — Progress Notes (Signed)
Subjective:  ?Breathing improved today.  Down to 5 L HFNC, oxygen saturation >90%. ? ?Objective:  ?Vital Signs in the last 24 hours: ?Temp:  [97.9 ?F (36.6 ?C)-98.7 ?F (37.1 ?C)] 98.4 ?F (36.9 ?C) (03/03 [id number]) ?Pulse Rate:  [72-81] 81 (03/03 0736) ?Resp:  [19-23] 20 (03/03 0736) ?BP: (97-122)/(57-72) 117/72 (03/03 0736) ?SpO2:  [90 %-95 %] 92 % (03/03 0736) ?Weight:  [91.3 kg] 91.3 kg (03/03 0148) ? ?Intake/Output from previous day: ?03/02 0701 - 03/03 0700 ?In: 51 [P.O.:860; I.V.:6] ?Out: 3100 [Urine:3100] ? ?Physical Exam ?Vitals and nursing note reviewed.  ?Constitutional:   ?   General: He is not in acute distress. ?Neck:  ?   Vascular: No JVD.  ?Cardiovascular:  ?   Rate and Rhythm: Normal rate and regular rhythm.  ?   Heart sounds: Normal heart sounds. No murmur heard. ?Pulmonary:  ?   Effort: Pulmonary effort is normal.  ?   Breath sounds: No wheezing or rales.  ?Musculoskeletal:  ?   Right lower leg: No edema.  ?   Left lower leg: No edema.  ? ? ?Cardiac Studies: ? ?Telemetry 06/21/2021: ?Sinus rhythm ?First degree AV block ? ?EKG: ?Pending today ? ?Echocardiogram 3/1/02023: ?1. Left ventricular ejection fraction, by estimation, is 60 to 65%. The  ?left ventricle has normal function. The left ventricle has no regional  ?wall motion abnormalities. There is mild left ventricular hypertrophy.  ?Left ventricular diastolic parameters  ?are indeterminate.  ? 2. Right ventricular systolic function is moderately reduced. The right  ?ventricular size is normal.  ? 3. Left atrial size was severely dilated.  ? 4. Right atrial size was moderately dilated.  ? 5. The mitral valve is grossly normal. Mild mitral valve regurgitation.  ? 6. The aortic valve is grossly normal. Aortic valve regurgitation is  ?trivial.  ? ? ? ?Imaging/tests reviewed and independently interpreted: ?Echocardiogram 06/20/2021 ?Telemetry 06/22/2021 ?CT chest 06/20/2021 ? ? ?Assessment & Recommendations: ? ?66 y/o Caucasian male with hypertension,  paroxysmal Afib, moderate nonobstructive coronary artery disease (cath 2012), COPD, active smoker, prior h/o polysubstance abuse (EtOH, cocaine), admitted with acute on chronic hypoxic respiratory failure, Afib w/RVR-now converted to sinus rhythm, bilateral subsegmental PE ? ?Acute on chronic hypoxic respiratory failure: ?Likely multifactorial.Initially felt to be due to Afib, but he has been in sinus rhythm since cardioversion on 06/19/2021. ?Bilateral subsegmental PE.Mod RV dilatation new, could be due to PE as well as cor pulmonale from COPD. No rV strain on CT chest 06/20/2021.  ?Infiltrate/atelectasis noted on CT scan.  ?EF normal on echocardiogram 06/20/2021, indeterminate diastolic function. ?Diuresed 4.4 L over 48 hours correlating with improvement in respiratory status suggesting component of HFPEF-now improving ?Changed IV lasix 40 mg bid to PO 40 mg twice daily. ?Continue spironlactone 25 mg daily ? ?Paroxysmal atrial fibrillation: ?Currently in sinus rhythm since cardioversion on 06/19/2021. ?He has had multiple symptomatic episodes of atrial fibrillation, therefore will benefit from rhythm control therapy. ?He has known coronary artery disease.  While he does have HFpEF, systolic function is preserved. ?I do think Multaq 400 mg twice daily will be a safe and effective medication for him, which has maintained sinus rhythm in the past. ?Sent prescription to [facility]. His insurance has approved it.  ?He has enough samples for 3 more days, my office is working on patient assistance, and Effexor.  We will follow-up. ?Continue diltiazem 120 mg daily. ?CHA2DS2VAsc score 4, annual stroke risk 5% ?Reviewed hematology recommendations. I do suspect lack of compliance played  a role, but I defer to primary team.  ? ?CAD: ?No angina symptoms at present. ?Not on Aspirin due to ongoing use of Xarelto. ?Continue Imdur, diltiazem, Ranexa, statin ?Check lipid panel ? ?Cardiology will sign off. Will arrange outpatient follow  up. Please call us back , if needed. ? ? ?[name], MD ?Pager: [phone] ?Office: [phone] ? ?

## 2021-06-22 NOTE — Progress Notes (Signed)
Patient refused morning labs. Informed on-call physician; acknowledged by physician. ? ?Patient continues to refuse scheduled CBGs. ?

## 2021-06-22 NOTE — TOC Transition Note (Signed)
Transition of Care (TOC) - CM/SW Discharge Note ? ? ?Patient Details  ?Name: Kevin Orozco ?MRN: [id number] ?Date of Birth: 1956-01-28 ? ?Transition of Care (TOC) CM/SW Contact:  ?Zenon Mayo, RN ?Phone Number: ?06/22/2021, 1:40 PM ? ? ?Clinical Narrative:    ?Patient has no DME needs, no HH needs, has PCP.  TOC has brought meds up to Staff RN, please check with Staff RN prior to dc to make sure he has meds.  Patient will call a taxi himself and pay for him a taxi at discharge.  ? ? ?Final next level of care: Home/Self Care ?Barriers to Discharge: Continued Medical Work up ? ? ?Patient Goals and CMS Choice ?Patient states their goals for this hospitalization and ongoing recovery are:: return home ?  ?Choice offered to / list presented to : NA ? ?Discharge Placement ?  ?           ?  ?  ?  ?  ? ?Discharge Plan and Services ?  ?Discharge Planning Services: CM Consult ?Post Acute Care Choice: NA          ?  ?DME Agency: NA ?  ?  ?  ?HH Arranged: NA ?  ?  ?  ?  ? ?Social Determinants of Health (SDOH) Interventions ?  ? ? ?Readmission Risk Interventions ?Readmission Risk Prevention Plan 06/22/2021  ?Transportation Screening Complete  ?PCP or Specialist Appt within 3-5 Days Complete  ?[place] or Home Care Consult Complete  ?Social Work Consult for [place] Planning/Counseling Complete  ?Palliative Care Screening Not Applicable  ?Medication Review Press photographer) Complete  ?Some recent data might be hidden  ? ? ? ? ? ?

## 2021-06-22 NOTE — Progress Notes (Signed)
ANTICOAGULATION CONSULT NOTE - Initial Consult ? ?Pharmacy Consult for Lovenox, Coumadin ?Indication:  afib, PE ? ?Allergies  ?Allergen Reactions  ? Etomidate Anxiety  ? Tape Other (See Comments)  ?  SKIN WILL TEAR EASILY!!!!  ? ? ?Patient Measurements: ?Weight: 91.3 kg (201 lb 4.5 oz) ? ?Vital Signs: ?Temp: 98.4 ?F (36.9 ?C) (03/03 [id number]) ?Temp Source: Oral (03/03 0437) ?BP: 117/72 (03/03 0736) ?Pulse Rate: 81 (03/03 0736) ? ?Labs: ?Recent Labs  ?  06/19/21 ?1519 06/19/21 ?1711 06/20/21 ?0425 06/20/21 ?1512 06/21/21 ?0449 06/22/21 ?0744  ?HGB 12.2*  --  13.8  --  12.9* 12.4*  ?HCT 37.2*  --  41.5  --  37.7* 36.7*  ?PLT 146*  --  163  --  146* 136*  ?APTT  --   --  56* 52*  --   --   ?LABPROT  --   --   --   --   --  14.4  ?INR  --   --   --   --   --  1.1  ?HEPARINUNFRC  --   --  >1.10*  --   --   --   ?CREATININE 0.92  --  0.88  --  1.06  --   ?TROPONINIHS 24* 30*  --   --   --   --   ? ? ? ?Estimated Creatinine Clearance: 82.4 mL/min (by C-G formula based on SCr of 1.06 mg/dL). ? ? ?Medical History: ?Past Medical History:  ?Diagnosis Date  ? Acute respiratory distress 02/22/2015  ? Atrial fibrillation with RVR ([place]) 04/19/12  ? New onset; spontaneous conversion to NSR; Pradaxa anticoagulation  ? CAD (coronary artery disease)   ? a. Cath 2008- nonobstructive, mod LAD, mild LCx & RCA disease b. Cardiolite 2010 normal c. 03/2012 abnormal Lexiscan Myoview attributed to cocaine d. 03/2012 echo: EF 55-60%, moderate LVH, moderate LA dilatation  ? Cocaine abuse ([place])   ? DM type 2 (diabetes mellitus, type 2) ([place])   ? ETOH abuse   ? 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 Elevated LFTs in the setting of acute EtOH abuse, no acute findings on abdominal u/s, HCV+, suspected acute alcohilic heptatitis  ? HTN (hypertension)   ? Hypercholesteremia   ? PAF (paroxysmal atrial fibrillation) ([place]) 02/22/2015  ? Tobacco abuse   ? ? ?Assessment: ? ?Anticoag: Heparin for new BL subsegmental PE, hx afib (PTA xarelto, LD 2/27 PM). CHADS2VASC  4, ?- 3/1 PM: IV site bleeding with heparin infusion.  ?- 3/2: Started Coumadin. No baseline INR. ?- Hgb 12.4 slightly down. Plts down 136, INR 1.1 today. Monitor for INR interaction with dronedarone. ? ?Goal of Therapy:  ?INR 2-3 ?Monitor platelets by anticoagulation protocol: Yes ?  ?Plan:  ?Lovenox 95mg /12h ?Repeat Coumadin 7.5mg  po x 1 tonight.  ?Daily INR, CBC q72h ? ? ?Everline Mahaffy S. Alford Highland, PharmD, BCPS ?Clinical Staff Pharmacist ?[email or web address] ?Alford Highland, [employer] ?06/22/2021,8:54 AM ? ? ?

## 2021-06-23 DIAGNOSIS — I2694 Multiple subsegmental pulmonary emboli without acute cor pulmonale: Secondary | ICD-10-CM | POA: Diagnosis not present

## 2021-06-23 LAB — CREATININE, SERUM
Creatinine, Ser: 0.84 mg/dL (ref 0.61–1.24)
GFR, Estimated: >60 mL/min (ref >60)

## 2021-06-23 LAB — GLUCOSE, CAPILLARY
Glucose-Capillary: 124 mg/dL — ABNORMAL HIGH (ref 70–99)
Glucose-Capillary: 114 mg/dL — ABNORMAL HIGH (ref 70–99)

## 2021-06-23 LAB — POTASSIUM: Potassium: 3.4 mmol/L — ABNORMAL LOW (ref 3.5–5.1)

## 2021-06-23 LAB — CBC
HCT: 34.1 % — ABNORMAL LOW (ref 39.0–52.0)
Hemoglobin: 11.8 g/dL — ABNORMAL LOW (ref 13.0–17.0)
MCH: 35.3 pg — ABNORMAL HIGH (ref 26.0–34.0)
MCHC: 34.6 g/dL (ref 30.0–36.0)
MCV: 102.1 fL — ABNORMAL HIGH (ref 80.0–100.0)
Platelets: 126 10*3/uL — ABNORMAL LOW (ref 150–400)
RBC: 3.34 MIL/uL — ABNORMAL LOW (ref 4.22–5.81)
RDW: 13.8 % (ref 11.5–15.5)
WBC: 10.3 10*3/uL (ref 4.0–10.5)
nRBC: 0 % (ref 0.0–0.2)

## 2021-06-23 LAB — PROTIME-INR
INR: 1.3 — ABNORMAL HIGH (ref 0.8–1.2)
Prothrombin Time: 16.4 seconds — ABNORMAL HIGH (ref 11.4–15.2)

## 2021-06-23 MED ORDER — POTASSIUM CHLORIDE CRYS ER 20 MEQ PO TBCR
40.0000 meq | EXTENDED_RELEASE_TABLET | Freq: Once | ORAL | Status: AC
  Administered 2021-06-23: 40 meq via ORAL
  Filled 2021-06-23: qty 2

## 2021-06-23 MED ORDER — POLYETHYLENE GLYCOL 3350 17 G PO PACK
17.0000 g | PACK | Freq: Every day | ORAL | Status: DC | PRN
  Administered 2021-06-25: 17 g via ORAL
  Filled 2021-06-23: qty 1

## 2021-06-23 MED ORDER — WARFARIN SODIUM 7.5 MG PO TABS: 7.5000 mg | ORAL_TABLET | Freq: Once | ORAL | Status: DC

## 2021-06-23 MED ORDER — BISACODYL 5 MG PO TBEC
5.0000 mg | DELAYED_RELEASE_TABLET | Freq: Every day | ORAL | Status: DC | PRN
  Administered 2021-06-25 – 2021-06-26 (×2): 5 mg via ORAL
  Filled 2021-06-23 (×2): qty 1

## 2021-06-23 MED ORDER — SENNOSIDES-DOCUSATE SODIUM 8.6-50 MG PO TABS
1.0000 | ORAL_TABLET | Freq: Every evening | ORAL | Status: DC | PRN
  Filled 2021-06-23: qty 1

## 2021-06-23 MED ORDER — WARFARIN SODIUM 5 MG PO TABS
8.0000 mg | ORAL_TABLET | Freq: Once | ORAL | Status: AC
  Administered 2021-06-23: 8 mg via ORAL
  Filled 2021-06-23: qty 1

## 2021-06-23 NOTE — Progress Notes (Signed)
PROGRESS NOTE    Kevin Orozco  [id number] DOB: [birth year] DOA: 06/19/2021 PCP: [name], NP    Brief Narrative:  Kevin Orozco is a 66 y.o. male with medical history significant of substance use, atrial fibrillation, hyperlipidemia, hypertension, diabetes, claudication, COPD, CHF, CAD, hepatitis C, chronic pain presenting with new pain and shortness of breath.   3/1 found with PE. Was on 15L [place], placed on bipap this am.  3/2 on HF , reports feeling better. Overnight issues where heparin gtt addressed and was switched to lovenox. Discussed with patient about Coumadin, INR checks, risk and possible complications including hemorrhagic head bleed and GI bleed and he verbalizes understanding.  Discussed with patient that he may require home oxygen and he refused to go home with oxygen.  Even when I tried to explain why the oxygen is important for him he still refuses.  3/4 patient denies having shortness of breath.  No chest pain.  He is threatening to walk out AMA, explained to him his INR is not therapeutic and complications and dangers of doing so.  He has agreed to stay here today but is threatening to walk out tomorrow if he is not discharged.  Consultants:  Hematology, cardiology, PCCm  Procedures:   Antimicrobials:      Subjective: No nausea or vomiting  Objective: Vitals:   06/23/21 0453 06/23/21 0719 06/23/21 0750 06/23/21 1145  BP:  137/75  107/69  Pulse:  71  64  Resp: 17 16  18   Temp: 98.2 F (36.8 C) 98.2 F (36.8 C)  97.9 F (36.6 C)  TempSrc: Oral Oral  Oral  SpO2: 93% 95% 96% 95%  Weight: 90.8 kg       Intake/Output Summary (Last 24 hours) at 06/23/2021 1152 Last data filed at 06/23/2021 0817 Gross per 24 hour  Intake 723 ml  Output 1500 ml  Net -777 ml   Filed Weights   06/21/21 0503 06/22/21 0148 06/23/21 0453  Weight: 94.2 kg 91.3 kg 90.8 kg    Calm, NAD Decreased breath no wheezing Reg s1/s2 no gallop Soft benign +bs No edema Aaoxox3   Mood and affect appropriate in current setting    Data Reviewed: I have personally reviewed following labs and imaging studies  CBC: Recent Labs  Lab 06/19/21 1519 06/20/21 0425 06/21/21 0449 06/22/21 0744 06/23/21 0241  WBC 9.7 14.3* 11.2* 10.6* 10.3  HGB 12.2* 13.8 12.9* 12.4* 11.8*  HCT 37.2* 41.5 37.7* 36.7* 34.1*  MCV 107.8* 107.5* 103.6* 102.8* 102.1*  PLT 146* 163 146* 136* 123XX123*   Basic Metabolic Panel: Recent Labs  Lab 06/19/21 1519 06/19/21 1711 06/20/21 0425 06/21/21 0449 06/23/21 0241  NA 135  --  137 136  --   K 3.9  --  4.1 3.8 3.4*  CL 103  --  105 100  --   CO2 23  --  21* 24  --   GLUCOSE 137*  --  81 83  --   BUN 19  --  21 20  --   CREATININE 0.92  --  0.88 1.06 0.84  CALCIUM 8.6*  --  8.5* 8.6*  --   MG  --  2.1  --   --   --    GFR: Estimated Creatinine Clearance: 103.8 mL/min (by C-G formula based on SCr of 0.84 mg/dL). Liver Function Tests: No results for input(s): AST, ALT, ALKPHOS, BILITOT, PROT, ALBUMIN in the last 168 hours. No results for input(s): LIPASE, AMYLASE in the last 168 hours.  No results for input(s): AMMONIA in the last 168 hours. Coagulation Profile: Recent Labs  Lab 06/22/21 0744 06/23/21 0241  INR 1.1 1.3*   Cardiac Enzymes: No results for input(s): CKTOTAL, CKMB, CKMBINDEX, TROPONINI in the last 168 hours. BNP (last 3 results) No results for input(s): PROBNP in the last 8760 hours. HbA1C: No results for input(s): HGBA1C in the last 72 hours. CBG: Recent Labs  Lab 06/20/21 0735 06/20/21 1152 06/20/21 1728  GLUCAP 90 107* 104*   Lipid Profile: Recent Labs    06/21/21 0449  CHOL 83  HDL 37*  LDLCALC 39  TRIG 37  CHOLHDL 2.2   Thyroid Function Tests: No results for input(s): TSH, T4TOTAL, FREET4, T3FREE, THYROIDAB in the last 72 hours. Anemia Panel: No results for input(s): VITAMINB12, FOLATE, FERRITIN, TIBC, IRON, RETICCTPCT in the last 72 hours. Sepsis Labs: No results for input(s): PROCALCITON,  LATICACIDVEN in the last 168 hours.  Recent Results (from the past 240 hour(s))  Resp Panel by RT-PCR (Flu A&B, Covid) Nasopharyngeal Swab     Status: None   Collection Time: 06/19/21 10:29 PM   Specimen: Nasopharyngeal Swab; Nasopharyngeal(NP) swabs in vial transport medium  Result Value Ref Range Status   SARS Coronavirus 2 by RT PCR NEGATIVE NEGATIVE Final    Comment: (NOTE) SARS-CoV-2 target nucleic acids are NOT DETECTED.  The SARS-CoV-2 RNA is generally detectable in upper respiratory specimens during the acute phase of infection. The lowest concentration of SARS-CoV-2 viral copies this assay can detect is 138 copies/mL. A negative result does not preclude SARS-Cov-2 infection and should not be used as the sole basis for treatment or other patient management decisions. A negative result may occur with  improper specimen collection/handling, submission of specimen other than nasopharyngeal swab, presence of viral mutation(s) within the areas targeted by this assay, and inadequate number of viral copies(<138 copies/mL). A negative result must be combined with clinical observations, patient history, and epidemiological information. The expected result is Negative.  Fact Sheet for Patients:  EntrepreneurPulse.com.au  Fact Sheet for Healthcare Providers:  IncredibleEmployment.be  This test is no t yet approved or cleared by the [place] FDA and  has been authorized for detection and/or diagnosis of SARS-CoV-2 by FDA under an Emergency Use Authorization (EUA). This EUA will remain  in effect (meaning this test can be used) for the duration of the COVID-19 declaration under Section 564(b)(1) of the Act, 21 U.S.C.section 360bbb-3(b)(1), unless the authorization is terminated  or revoked sooner.       Influenza A by PCR NEGATIVE NEGATIVE Final   Influenza B by PCR NEGATIVE NEGATIVE Final    Comment: (NOTE) The Xpert Xpress  SARS-CoV-2/FLU/RSV plus assay is intended as an aid in the diagnosis of influenza from Nasopharyngeal swab specimens and should not be used as a sole basis for treatment. Nasal washings and aspirates are unacceptable for Xpert Xpress SARS-CoV-2/FLU/RSV testing.  Fact Sheet for Patients: EntrepreneurPulse.com.au  Fact Sheet for Healthcare Providers: IncredibleEmployment.be  This test is not yet approved or cleared by the [place] FDA and has been authorized for detection and/or diagnosis of SARS-CoV-2 by FDA under an Emergency Use Authorization (EUA). This EUA will remain in effect (meaning this test can be used) for the duration of the COVID-19 declaration under Section 564(b)(1) of the Act, 21 U.S.C. section 360bbb-3(b)(1), unless the authorization is terminated or revoked.  Performed at [facility], [place] [address], [place], [place] [address]          Radiology  Studies: CT ABDOMEN PELVIS W CONTRAST  Result Date: 06/21/2021 CLINICAL DATA:  Evaluate for occult malignancy EXAM: CT ABDOMEN AND PELVIS WITH CONTRAST TECHNIQUE: Multidetector CT imaging of the abdomen and pelvis was performed using the standard protocol following bolus administration of intravenous contrast. RADIATION DOSE REDUCTION: This exam was performed according to the departmental dose-optimization program which includes automated exposure control, adjustment of the mA and/or kV according to patient size and/or use of iterative reconstruction technique. CONTRAST:  100mL OMNIPAQUE IOHEXOL 300 MG/ML  SOLN COMPARISON:  Sonogram done on 07/09/2017 FINDINGS: Lower chest: Heart is enlarged in size. Small bilateral pleural effusions are seen, more so on the right side. There are patchy infiltrates in the posterior lower lung fields, possibly atelectasis. Possibility of pneumonia is not excluded. Coronary artery calcifications are seen. There is dense pericardial calcification.  Hepatobiliary: There is nodularity in the liver surface. No focal abnormality is seen. Gallbladder is unremarkable. Pancreas: No focal abnormality is seen. Spleen: Unremarkable. Adrenals/Urinary Tract: Adrenals are not enlarged. There is no hydronephrosis. There are scattered calcifications in the renal artery branches. There are multiple smooth marginated low-density lesions in the renal cortex largest measuring 4.3 cm in the upper pole of left kidney. Ureters are not dilated. Urinary bladder is unremarkable. Stomach/Bowel: Small hiatal hernia is seen. Stomach is not distended. Small bowel loops are not dilated. Appendix is not seen. There is no focal pericecal inflammation. There is no significant wall thickening in colon. There is no pericolic stranding or fluid collection. Vascular/Lymphatic: Dense calcifications are seen in the aorta and its major branches. Reproductive: Unremarkable. Other: There is no ascites or pneumoperitoneum. Bilateral inguinal hernias containing fat are seen, larger on the right side. Umbilical and paraumbilical hernias containing fat are noted. There is small ventral hernia containing fat slightly superior to the umbilicus. Musculoskeletal: Degenerative changes are noted in the lumbar spine with encroachment of neural foramina at multiple levels, more so at L4-L5 and L5-S1 levels. Schmorl's nodes are seen in the multiple vertebral bodies. IMPRESSION: There is no evidence of intestinal obstruction or pneumoperitoneum. There is no hydronephrosis. Bilateral pleural effusions, more so on the right side. Infiltrates in the both lower lung fields suggest atelectasis or pneumonia. Nodularity in the liver surface suggests possible cirrhosis. Small hiatal hernia. Bilateral renal cysts. Other findings as described in the body of the report. Electronically Signed   By: Ernie AvenaPalani  Rathinasamy M.D.   On: 06/21/2021 12:35        Scheduled Meds:  diltiazem  120 mg Oral Daily   dronedarone  400 mg  Oral BID WC   enoxaparin (LOVENOX) injection  95 mg Subcutaneous Q12H   fluticasone furoate-vilanterol  1 puff Inhalation Daily   And   umeclidinium bromide  1 puff Inhalation Daily   furosemide  40 mg Oral BID   gabapentin  300 mg Oral QHS   insulin aspart  0-9 Units Subcutaneous TID WC   isosorbide mononitrate  30 mg Oral Daily   morphine  30 mg Oral Q12H   pantoprazole  40 mg Oral QAC breakfast   predniSONE  10 mg Oral Q breakfast   ranolazine  500 mg Oral BID   rosuvastatin  10 mg Oral Daily   sodium chloride flush  3 mL Intravenous Q12H   spironolactone  25 mg Oral Daily   warfarin  7.5 mg Oral ONCE-1600   Warfarin - Pharmacist Dosing Inpatient   Does not apply q1600   Continuous Infusions:    Assessment & Plan:  Principal Problem:   Multiple subsegmental pulmonary emboli without acute cor pulmonale (HCC) Active Problems:   Paroxysmal A-fib (HCC)   Essential hypertension   CAD (coronary artery disease)   Hyperlipidemia   Acute respiratory failure with hypoxia (HCC)   Acute on chronic heart failure with preserved ejection fraction (HCC)   Chronic obstructive pulmonary disease (HCC)   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HCC)   Anxiety   Acute pulmonary embolism Acute respiratory failure with hypoxia Acute resp. Failure due to copd exac, acute chf, and PE 3/2 heparin drip was switched to Lovenox due to bleed from IV site overnight Discussed Coumadin with patient and will consult pharmacy for Coumadin initiation since failed Xarelto Hematologist input was appreciated  CT  a/p without evidence of malignancy  Venous ultrasound negative for DVT  3/4 continue Lasix INR still subtherapeutic with goal 2-3 Pharmacy to dose Coumadin Continue Lovenox PE dose Wean down oxygen as tolerated We will check ambulatory oxygen today     Atrial fibrillation Cardiology following Currently sinus rhythm, with multiple symptomatic episodes of A-fib  benefits from rhythm control Continue multaq, insurance approved  3/4 continue Coumadin Continue diltiazem Follow-up with cardiology as outpatient    CAD with stable Angina Continue Imdur, Ranexa and statins  3/4 holding beta-blockers    Hypokalemia Replace with potassium Also on Aldactone   Acute on chronic HFpEF Echo with normal EF BNP was elevated from baseline Holding beta-blockers as above 3/4 clinically improving Continue Lasix p.o. Continue Aldactone    Leukocytosis Likely reactive Improving Afebrile  Elevated Troponin Nml ef , no wma     Hypertension - Continue diltiazem, Imdur, spironolactone  Hyperlipidemia - Continue home rosuvastatin   COPD - Replace home Trelegy with formulary Breo and Incruse - As needed DuoNebs - Continue Daily Prednisone  Chronic pain - Continue home morphine and as needed oxycodone -Will need to confirm if he takes lubiprostone, Movantik or both, resume when confirmed by pharmacy   Anxiety Insomnia - Continue home as needed Klonopin - Continue home trazodone,    DM  - SSI   DVT prophylaxis: lovenox Code Status:full Family Communication: none at bedside Disposition Plan:  Status is: Inpatient Remains inpatient appropriate because: IV treatment. Needs inr to be therapeutic since has b/l PE                LOS: 3 days   Time spent:45 min with >50% on coc    [name], MD [facility] Pager [phone]  If 7PM-7AM, please contact night-coverage 06/23/2021, 11:52 AM

## 2021-06-23 NOTE — Progress Notes (Addendum)
ANTICOAGULATION CONSULT NOTE - Follow Up Consult ? ?Pharmacy Consult for Lovenox, Coumadin ?Indication: atrial fibrillation and pulmonary embolus ? ?Allergies  ?Allergen Reactions  ? Etomidate Anxiety  ? Tape Other (See Comments)  ?  SKIN WILL TEAR EASILY!!!!  ? ? ?Patient Measurements: ?Weight: 90.8 kg (200 lb 1.6 oz) ? ?Vital Signs: ?Temp: 98.2 ?F (36.8 ?C) (03/04 0719) ?Temp Source: Oral (03/04 0719) ?BP: 137/75 (03/04 0719) ?Pulse Rate: 71 (03/04 0719) ? ?Labs: ?Recent Labs  ?  06/20/21 ?1512 06/21/21 ?0449 06/21/21 ?0449 06/22/21 ?[id number] 06/23/21 ?0241  ?HGB  --  12.9*   < > 12.4* 11.8*  ?HCT  --  37.7*  --  36.7* 34.1*  ?PLT  --  146*  --  136* 126*  ?APTT 52*  --   --   --   --   ?LABPROT  --   --   --  14.4 16.4*  ?INR  --   --   --  1.1 1.3*  ?CREATININE  --  1.06  --   --  0.84  ? < > = values in this interval not displayed.  ? ? ?Estimated Creatinine Clearance: 103.8 mL/min (by C-G formula based on SCr of 0.84 mg/dL). ? ?Assessment: ?66 yo male admitted 2/28 with shortness of breath and back pain. Patient with hx of afib on PTA Xarelto (last dose 2/27 PM). Initially consulted for heparin for new BL subsegmental PE, now on warfarin. ? ?INR today is still below goal at 1.3. Hemoglobin is still down slightly at 11.8 and platelets are also down slightly at 126. No issues with bleeding noted per RN. Will continue to monitor for INR interaction with dronedarone. ? ?Goal of Therapy:  ?INR 2-3 ?Monitor platelets by anticoagulation protocol: Yes ?  ?Plan:  ?Continue Lovenox 95 mg subcutaneous q12h ?Warfarin 7.5 mg PO x1 tonight  ?Daily INR, CBC, monitor for s/sx of bleeding ? ?Thank you for including pharmacy in the care of this patient. ? ?Zenaida Deed, PharmD ?PGY1 Acute Care Pharmacy Resident  ?Phone: [phone] ?06/23/2021  8:21 AM ? ?Please check [email or web address] for unit-specific pharmacy phone numbers. ? ?_____________________ ?Addendum 06/23/21 @1201 : ? ?Patient threatening to leave AMA. Following discussion with  MD, patient is willing to stay. Adjusted dose to warfarin 8 mg per MD request to try to increase INR slightly faster without overshooting goal.  ? ?Plan:  ?Continue Lovenox 95 mg subcutaneous q12h ?Warfarin 8 mg PO x1 tonight  ?Daily INR, CBC, monitor for s/sx of bleeding ? ? ?

## 2021-06-24 DIAGNOSIS — I2694 Multiple subsegmental pulmonary emboli without acute cor pulmonale: Secondary | ICD-10-CM | POA: Diagnosis not present

## 2021-06-24 LAB — CBC
HCT: 34.6 % — ABNORMAL LOW (ref 39.0–52.0)
Hemoglobin: 11.5 g/dL — ABNORMAL LOW (ref 13.0–17.0)
MCH: 34.5 pg — ABNORMAL HIGH (ref 26.0–34.0)
MCHC: 33.2 g/dL (ref 30.0–36.0)
MCV: 103.9 fL — ABNORMAL HIGH (ref 80.0–100.0)
Platelets: 155 10*3/uL (ref 150–400)
RBC: 3.33 MIL/uL — ABNORMAL LOW (ref 4.22–5.81)
RDW: 13.8 % (ref 11.5–15.5)
WBC: 8 10*3/uL (ref 4.0–10.5)
nRBC: 0 % (ref 0.0–0.2)

## 2021-06-24 LAB — PROTIME-INR
INR: 1.6 — ABNORMAL HIGH (ref 0.8–1.2)
Prothrombin Time: 19 seconds — ABNORMAL HIGH (ref 11.4–15.2)

## 2021-06-24 MED ORDER — WARFARIN SODIUM 10 MG PO TABS
10.0000 mg | ORAL_TABLET | Freq: Once | ORAL | Status: AC
  Administered 2021-06-24: 10 mg via ORAL
  Filled 2021-06-24: qty 1

## 2021-06-24 NOTE — Progress Notes (Signed)
ANTICOAGULATION CONSULT NOTE - Follow Up Consult ? ?Pharmacy Consult for Lovenox, Coumadin ?Indication: atrial fibrillation and pulmonary embolus ? ?Allergies  ?Allergen Reactions  ? Etomidate Anxiety  ? Tape Other (See Comments)  ?  SKIN WILL TEAR EASILY!!!!  ? ? ?Patient Measurements: ?Weight: 90.1 kg (198 lb 10.2 oz) ? ?Vital Signs: ?Temp: 98 ?F (36.7 ?C) (03/05 1057) ?Temp Source: Oral (03/05 1057) ?BP: 106/68 (03/05 1100) ?Pulse Rate: 58 (03/05 1100) ? ?Labs: ?Recent Labs  ?  06/22/21 ?0744 06/23/21 ?0241 06/24/21 ?0150  ?HGB 12.4* 11.8* 11.5*  ?HCT 36.7* 34.1* 34.6*  ?PLT 136* 126* 155  ?LABPROT 14.4 16.4* 19.0*  ?INR 1.1 1.3* 1.6*  ?CREATININE  --  0.84  --   ? ? ? ?Estimated Creatinine Clearance: 95.9 mL/min (by C-G formula based on SCr of 0.84 mg/dL). ? ?Assessment: ?66 yo male admitted 2/28 with shortness of breath and back pain. Patient with hx of afib on PTA Xarelto (last dose 2/27 PM). Initially consulted for heparin for new BL subsegmental PE, now on warfarin. ? ?INR today is still below goal at 1.6. Hemoglobin is still down slightly at 11.5 and platelets are stable at 155. No issues with bleeding noted per RN. Will continue to monitor for INR interaction with dronedarone. ? ?Goal of Therapy:  ?INR 2-3 ?Monitor platelets by anticoagulation protocol: Yes ?  ?Plan:  ?Continue Lovenox 95 mg subcutaneous q12h ?Warfarin 10 mg PO x1 tonight  ?Daily INR, CBC, monitor for s/sx of bleeding ? ?Thank you for including pharmacy in the care of this patient. ? ?Zenaida Deed, PharmD ?PGY1 Acute Care Pharmacy Resident  ?Phone: [phone] ?06/24/2021  12:16 PM ? ?Please check [email or web address] for unit-specific pharmacy phone numbers. ?

## 2021-06-24 NOTE — Progress Notes (Signed)
PROGRESS NOTE    Erick Aye  [id number] DOB: 08-21-1955 DOA: 06/19/2021 PCP: [name], NP    Brief Narrative:  Nouman Malagon is a 66 y.o. male with medical history significant of substance use, atrial fibrillation, hyperlipidemia, hypertension, diabetes, claudication, COPD, CHF, CAD, hepatitis C, chronic pain presenting with new pain and shortness of breath.   3/1 found with PE. Was on 15L [place], placed on bipap this am.  3/2 on HF , reports feeling better. Overnight issues where heparin gtt addressed and was switched to lovenox. Discussed with patient about Coumadin, INR checks, risk and possible complications including hemorrhagic head bleed and GI bleed and he verbalizes understanding.  Discussed with patient that he may require home oxygen and he refused to go home with oxygen.  Even when I tried to explain why the oxygen is important for him he still refuses.  3/4 patient denies having shortness of breath.  No chest pain.  He is threatening to walk out AMA, explained to him his INR is not therapeutic and complications and dangers of doing so.  He has agreed to stay here today but is threatening to walk out tomorrow if he is not discharged.  3/5 inr 1.6. threatening to leave  AMA again, and once again talked patient in staying.   Consultants:  Hematology, cardiology, PCCm  Procedures:   Antimicrobials:      Subjective: No cp, sob, n/v  Objective: Vitals:   06/24/21 0739 06/24/21 0758 06/24/21 1057 06/24/21 1100  BP: 122/79  106/68 106/68  Pulse: 63  (!) 58 (!) 58  Resp: 18  17 16   Temp: 97.8 F (36.6 C)  98 F (36.7 C)   TempSrc: Oral  Oral   SpO2: 94% 95% 93% 92%  Weight:        Intake/Output Summary (Last 24 hours) at 06/24/2021 1210 Last data filed at 06/24/2021 0739 Gross per 24 hour  Intake 603 ml  Output --  Net 603 ml   Filed Weights   06/22/21 0148 06/23/21 0453 06/24/21 0600  Weight: 91.3 kg 90.8 kg 90.1 kg    Calm, NAD Decrease b/s Reg  s1/s2 no gallop Soft benign +bs No edema Aaoxox3  Mood and affect appropriate in current setting    Data Reviewed: I have personally reviewed following labs and imaging studies  CBC: Recent Labs  Lab 06/20/21 0425 06/21/21 0449 06/22/21 0744 06/23/21 0241 06/24/21 0150  WBC 14.3* 11.2* 10.6* 10.3 8.0  HGB 13.8 12.9* 12.4* 11.8* 11.5*  HCT 41.5 37.7* 36.7* 34.1* 34.6*  MCV 107.5* 103.6* 102.8* 102.1* 103.9*  PLT 163 146* 136* 126* [id number]   Basic Metabolic Panel: Recent Labs  Lab 06/19/21 1519 06/19/21 1711 06/20/21 0425 06/21/21 0449 06/23/21 0241  NA 135  --  137 136  --   K 3.9  --  4.1 3.8 3.4*  CL 103  --  105 100  --   CO2 23  --  21* 24  --   GLUCOSE 137*  --  81 83  --   BUN 19  --  21 20  --   CREATININE 0.92  --  0.88 1.06 0.84  CALCIUM 8.6*  --  8.5* 8.6*  --   MG  --  2.1  --   --   --    GFR: Estimated Creatinine Clearance: 95.9 mL/min (by C-G formula based on SCr of 0.84 mg/dL). Liver Function Tests: No results for input(s): AST, ALT, ALKPHOS, BILITOT, PROT, ALBUMIN in the last  168 hours. No results for input(s): LIPASE, AMYLASE in the last 168 hours. No results for input(s): AMMONIA in the last 168 hours. Coagulation Profile: Recent Labs  Lab 06/22/21 0744 06/23/21 0241 06/24/21 0150  INR 1.1 1.3* 1.6*   Cardiac Enzymes: No results for input(s): CKTOTAL, CKMB, CKMBINDEX, TROPONINI in the last 168 hours. BNP (last 3 results) No results for input(s): PROBNP in the last 8760 hours. HbA1C: No results for input(s): HGBA1C in the last 72 hours. CBG: Recent Labs  Lab 06/20/21 0735 06/20/21 1152 06/20/21 1728 06/23/21 1201 06/23/21 1620  GLUCAP 90 107* 104* 124* 114*   Lipid Profile: No results for input(s): CHOL, HDL, LDLCALC, TRIG, CHOLHDL, LDLDIRECT in the last 72 hours.  Thyroid Function Tests: No results for input(s): TSH, T4TOTAL, FREET4, T3FREE, THYROIDAB in the last 72 hours. Anemia Panel: No results for input(s): VITAMINB12,  FOLATE, FERRITIN, TIBC, IRON, RETICCTPCT in the last 72 hours. Sepsis Labs: No results for input(s): PROCALCITON, LATICACIDVEN in the last 168 hours.  Recent Results (from the past 240 hour(s))  Resp Panel by RT-PCR (Flu A&B, Covid) Nasopharyngeal Swab     Status: None   Collection Time: 06/19/21 10:29 PM   Specimen: Nasopharyngeal Swab; Nasopharyngeal(NP) swabs in vial transport medium  Result Value Ref Range Status   SARS Coronavirus 2 by RT PCR NEGATIVE NEGATIVE Final    Comment: (NOTE) SARS-CoV-2 target nucleic acids are NOT DETECTED.  The SARS-CoV-2 RNA is generally detectable in upper respiratory specimens during the acute phase of infection. The lowest concentration of SARS-CoV-2 viral copies this assay can detect is 138 copies/mL. A negative result does not preclude SARS-Cov-2 infection and should not be used as the sole basis for treatment or other patient management decisions. A negative result may occur with  improper specimen collection/handling, submission of specimen other than nasopharyngeal swab, presence of viral mutation(s) within the areas targeted by this assay, and inadequate number of viral copies(<138 copies/mL). A negative result must be combined with clinical observations, patient history, and epidemiological information. The expected result is Negative.  Fact Sheet for Patients:  EntrepreneurPulse.com.au  Fact Sheet for Healthcare Providers:  IncredibleEmployment.be  This test is no t yet approved or cleared by the [place] FDA and  has been authorized for detection and/or diagnosis of SARS-CoV-2 by FDA under an Emergency Use Authorization (EUA). This EUA will remain  in effect (meaning this test can be used) for the duration of the COVID-19 declaration under Section 564(b)(1) of the Act, 21 U.S.C.section 360bbb-3(b)(1), unless the authorization is terminated  or revoked sooner.       Influenza A by PCR  NEGATIVE NEGATIVE Final   Influenza B by PCR NEGATIVE NEGATIVE Final    Comment: (NOTE) The Xpert Xpress SARS-CoV-2/FLU/RSV plus assay is intended as an aid in the diagnosis of influenza from Nasopharyngeal swab specimens and should not be used as a sole basis for treatment. Nasal washings and aspirates are unacceptable for Xpert Xpress SARS-CoV-2/FLU/RSV testing.  Fact Sheet for Patients: EntrepreneurPulse.com.au  Fact Sheet for Healthcare Providers: IncredibleEmployment.be  This test is not yet approved or cleared by the [place] FDA and has been authorized for detection and/or diagnosis of SARS-CoV-2 by FDA under an Emergency Use Authorization (EUA). This EUA will remain in effect (meaning this test can be used) for the duration of the COVID-19 declaration under Section 564(b)(1) of the Act, 21 U.S.C. section 360bbb-3(b)(1), unless the authorization is terminated or revoked.  Performed at [facility], [place]  [address], [place], [place] [address]          Radiology Studies: No results found.      Scheduled Meds:  diltiazem  120 mg Oral Daily   dronedarone  400 mg Oral BID WC   enoxaparin (LOVENOX) injection  95 mg Subcutaneous Q12H   fluticasone furoate-vilanterol  1 puff Inhalation Daily   And   umeclidinium bromide  1 puff Inhalation Daily   furosemide  40 mg Oral BID   gabapentin  300 mg Oral QHS   insulin aspart  0-9 Units Subcutaneous TID WC   isosorbide mononitrate  30 mg Oral Daily   morphine  30 mg Oral Q12H   pantoprazole  40 mg Oral QAC breakfast   predniSONE  10 mg Oral Q breakfast   ranolazine  500 mg Oral BID   rosuvastatin  10 mg Oral Daily   sodium chloride flush  3 mL Intravenous Q12H   spironolactone  25 mg Oral Daily   Warfarin - Pharmacist Dosing Inpatient   Does not apply q1600   Continuous Infusions:    Assessment & Plan:   Principal Problem:   Multiple subsegmental pulmonary emboli  without acute cor pulmonale (HCC) Active Problems:   Paroxysmal A-fib (HCC)   Essential hypertension   CAD (coronary artery disease)   Hyperlipidemia   Acute respiratory failure with hypoxia (HCC)   Acute on chronic heart failure with preserved ejection fraction (HCC)   Chronic obstructive pulmonary disease (HCC)   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HCC)   Anxiety   Acute pulmonary embolism Acute respiratory failure with hypoxia Acute resp. Failure due to copd exac, acute chf, and PE 3/2 heparin drip was switched to Lovenox due to bleed from IV site overnight Discussed Coumadin with patient and will consult pharmacy for Coumadin initiation since failed Xarelto Hematologist input was appreciated  CT  a/p without evidence of malignancy  Venous ultrasound negative for DVT  3/5 pharmacy will give Coumadin 10 mg tonight  INR goal 2-3  Continue Lovenox  Will need ambulatory oxygen       Atrial fibrillation Cardiology following Currently sinus rhythm, with multiple symptomatic episodes of A-fib benefits from rhythm control Continue multaq, insurance approved  3/5 continue Coumadin, diltiazem Follow-up with cardiology as outpatient     CAD with stable Angina Continue Imdur, Ranexa and statins  3/5 continue holding beta-blockers   Hypokalemia Replaced On Aldactone   Acute on chronic HFpEF Echo with normal EF BNP was elevated from baseline Holding beta-blockers as above 3/4 clinically improving Continue Lasix p.o. Continue Aldactone    Leukocytosis Likely reactive Improving Afebrile  Elevated Troponin Nml ef , no wma     Hypertension - Continue diltiazem, Imdur, spironolactone  Hyperlipidemia - Continue home rosuvastatin   COPD - Replace home Trelegy with formulary Breo and Incruse - As needed DuoNebs - Continue Daily Prednisone  Chronic pain - Continue home morphine and as needed oxycodone -Will need to confirm if he  takes lubiprostone, Movantik or both, resume when confirmed by pharmacy   Anxiety Insomnia - Continue home as needed Klonopin - Continue home trazodone,    DM  - SSI   DVT prophylaxis: lovenox Code Status:full Family Communication: none at bedside Disposition Plan:  Status is: Inpatient Remains inpatient appropriate because: IV treatment. Needs inr to be therapeutic since has b/l PE                LOS: 4 days   Time spent:35 min  with >50% on coc    [name], MD [facility] Pager [phone]  If 7PM-7AM, please contact night-coverage 06/24/2021, 12:10 PM

## 2021-06-25 ENCOUNTER — Other Ambulatory Visit (HOSPITAL_COMMUNITY): Payer: Self-pay

## 2021-06-25 LAB — CBC
HCT: 37.9 % — ABNORMAL LOW (ref 39.0–52.0)
Hemoglobin: 12.7 g/dL — ABNORMAL LOW (ref 13.0–17.0)
MCH: 35.8 pg — ABNORMAL HIGH (ref 26.0–34.0)
MCHC: 33.5 g/dL (ref 30.0–36.0)
MCV: 106.8 fL — ABNORMAL HIGH (ref 80.0–100.0)
Platelets: 151 10*3/uL (ref 150–400)
RBC: 3.55 MIL/uL — ABNORMAL LOW (ref 4.22–5.81)
RDW: 13.5 % (ref 11.5–15.5)
WBC: 6.3 10*3/uL (ref 4.0–10.5)
nRBC: 0 % (ref 0.0–0.2)

## 2021-06-25 LAB — PROTIME-INR
INR: 1.8 — ABNORMAL HIGH (ref 0.8–1.2)
INR: 1.8 — ABNORMAL HIGH (ref 0.8–1.2)
Prothrombin Time: 21.1 seconds — ABNORMAL HIGH (ref 11.4–15.2)
Prothrombin Time: 21.3 seconds — ABNORMAL HIGH (ref 11.4–15.2)

## 2021-06-25 LAB — BASIC METABOLIC PANEL
Anion gap: 9 (ref 5–15)
BUN: 13 mg/dL (ref 8–23)
CO2: 24 mmol/L (ref 22–32)
Calcium: 8.3 mg/dL — ABNORMAL LOW (ref 8.9–10.3)
Chloride: 102 mmol/L (ref 98–111)
Creatinine, Ser: 0.98 mg/dL (ref 0.61–1.24)
GFR, Estimated: >60 mL/min (ref >60)
Glucose, Bld: 89 mg/dL (ref 70–99)
Potassium: 4.2 mmol/L (ref 3.5–5.1)
Sodium: 135 mmol/L (ref 135–145)

## 2021-06-25 LAB — GLUCOSE, CAPILLARY
Glucose-Capillary: 113 mg/dL — ABNORMAL HIGH (ref 70–99)
Glucose-Capillary: 102 mg/dL — ABNORMAL HIGH (ref 70–99)

## 2021-06-25 MED ORDER — WARFARIN SODIUM 10 MG PO TABS: 10.0000 mg | ORAL_TABLET | Freq: Once | ORAL | Status: DC

## 2021-06-25 MED ORDER — WARFARIN SODIUM 2 MG PO TABS
12.0000 mg | ORAL_TABLET | Freq: Once | ORAL | Status: AC
  Administered 2021-06-25: 12 mg via ORAL
  Filled 2021-06-25: qty 1

## 2021-06-25 MED ORDER — FUROSEMIDE 40 MG PO TABS
40.0000 mg | ORAL_TABLET | Freq: Two times a day (BID) | ORAL | Status: DC
  Administered 2021-06-25 – 2021-06-26 (×2): 40 mg via ORAL
  Filled 2021-06-25 (×2): qty 1

## 2021-06-25 NOTE — Progress Notes (Signed)
ANTICOAGULATION CONSULT NOTE - Follow Up Consult ? ?Pharmacy Consult for Lovenox, warfarin ?Indication: atrial fibrillation and pulmonary embolus ? ?Allergies  ?Allergen Reactions  ? Etomidate Anxiety  ? Tape Other (See Comments)  ?  SKIN WILL TEAR EASILY!!!!  ? ? ?Patient Measurements: ?Weight: 90.1 kg (198 lb 10.2 oz) ? ?Vital Signs: ?Temp: 97.9 ?F (36.6 ?C) (03/06 0725) ?Temp Source: Oral (03/06 0725) ?BP: 124/81 (03/06 0725) ?Pulse Rate: 60 (03/06 0725) ? ?Labs: ?Recent Labs  ?  06/23/21 ?0241 06/24/21 ?0150 06/25/21 ?0329  ?HGB 11.8* 11.5* 12.7*  ?HCT 34.1* 34.6* 37.9*  ?PLT 126* 155 151  ?LABPROT 16.4* 19.0* 21.1*  ?INR 1.3* 1.6* 1.8*  ?CREATININE 0.84  --   --   ? ? ? ?Estimated Creatinine Clearance: 95.9 mL/min (by C-G formula based on SCr of 0.84 mg/dL). ? ?Assessment: ?66 yo male admitted 2/28 with shortness of breath and back pain. Patient with hx of afib on PTA Xarelto (last dose 2/27 PM). 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 Patient states he was taking it every day, but not always at the same time and not always with food.  ?Pharmacy consulted to dose Lovenox and warfarin for new bilateral subsegmental PE. ?Cardioversion on 2/28, was in normal sinus rhythm after that. ? ?INR trending up but remains subtherapeutic at 1.8. Hgb stable at 12.7, platelets stable 151. No infusion or bleeding issues noted. PO intake 75-100% since admission. ?Notable drug-drug interactions w/warfarin: dronedarone, note both are new and starting together ? ? ?Goal of Therapy:  ?INR 2-3 ?Monitor platelets by anticoagulation protocol: Yes ?  ?Plan:  ?Continue Lovenox 95 mg subcutaneously q12h ?Repeat warfarin 10 mg PO x 1 today  ?Monitor daily INR, CBC, clinical course, s/sx of bleed, PO intake/diet, Drug-Drug Interactions ? ? ? ?Thank you for allowing [name] to participate in this patients care. ?Jens Som, PharmD ?06/25/2021 7:57 AM ? ?**Pharmacist phone directory can be found on [email or web address] listed under [place]** ? ?

## 2021-06-25 NOTE — Progress Notes (Signed)
Mobility Specialist Progress Note: ? ? 06/25/21 1330  ?Mobility  ?Activity Ambulated with assistance in hallway  ?Level of Assistance Standby assist, set-up cues, supervision of patient - no hands on  ?Distance Ambulated (ft) 470 ft  ?Activity Response Tolerated well  ?$Mobility charge 1 Mobility  ? ?Pt asx during ambulation on RA. No SOB noted. SpO2 93% throughout. ? ?Nelta Numbers ?Acute Rehab ?Phone: [phone] ?Office Phone: [phone] ? ?

## 2021-06-25 NOTE — Plan of Care (Signed)
?  Problem: Education: ?Goal: Knowledge of General Education information will improve ?Description: Including pain rating scale, medication(s)/side effects and non-pharmacologic comfort measures ?Outcome: Progressing ?  ?Problem: Health Behavior/Discharge Planning: ?Goal: Ability to manage health-related needs will improve ?Outcome: Progressing ?  ?Problem: Clinical Measurements: ?Goal: Diagnostic test results will improve ?Outcome: Progressing ?Goal: Respiratory complications will improve ?Outcome: Progressing ?Goal: Cardiovascular complication will be avoided ?Outcome: Progressing ?  ?Problem: Activity: ?Goal: Risk for activity intolerance will decrease ?Outcome: Progressing ?  ?Problem: Coping: ?Goal: Level of anxiety will decrease ?Outcome: Progressing ?  ?Problem: Pain Managment: ?Goal: General experience of comfort will improve ?Outcome: Progressing ?  ?Problem: Activity: ?Goal: Capacity to carry out activities will improve ?Outcome: Progressing ?  ?

## 2021-06-25 NOTE — Progress Notes (Signed)
PROGRESS NOTE    Kevin Orozco  [id number] DOB: 04/08/1956 DOA: 06/19/2021 PCP: [name], NP    Brief Narrative:  Kevin Orozco is a 66 y.o. male with medical history significant of substance use, atrial fibrillation, hyperlipidemia, hypertension, diabetes, claudication, COPD, CHF, CAD, hepatitis C, chronic pain presenting with new pain and shortness of breath.   3/1 found with PE. Was on 15L [place], placed on bipap this am.  3/2 on HF , reports feeling better. Overnight issues where heparin gtt addressed and was switched to lovenox. Discussed with patient about Coumadin, INR checks, risk and possible complications including hemorrhagic head bleed and GI bleed and he verbalizes understanding.  Discussed with patient that he may require home oxygen and he refused to go home with oxygen.  Even when I tried to explain why the oxygen is important for him he still refuses.  3/4 patient denies having shortness of breath.  No chest pain.  He is threatening to walk out AMA, explained to him his INR is not therapeutic and complications and dangers of doing so.  He has agreed to stay here today but is threatening to walk out tomorrow if he is not discharged.  3/5 inr 1.6. threatening to leave  AMA again, and once again talked patient in staying.  3/6 1.8.  Asked nursing to do ambulatory oxygen  Consultants:  Hematology, cardiology, PCCm  Procedures:   Antimicrobials:      Subjective: No sob, or cp.  Objective: Vitals:   06/24/21 2353 06/25/21 0341 06/25/21 0725 06/25/21 1055  BP: 129/72 122/75 124/81 105/72  Pulse: (!) 59 (!) 57 60 66  Resp: 14 12 17 17   Temp: 97.8 F (36.6 C) 97.7 F (36.5 C) 97.9 F (36.6 C) 98.1 F (36.7 C)  TempSrc: Oral Oral Oral Oral  SpO2: 99% 97% 96% 96%  Weight:        Intake/Output Summary (Last 24 hours) at 06/25/2021 1430 Last data filed at 06/25/2021 1251 Gross per 24 hour  Intake 777 ml  Output --  Net 777 ml   Filed Weights   06/22/21  0148 06/23/21 0453 06/24/21 0600  Weight: 91.3 kg 90.8 kg 90.1 kg   Calm, NAD Decreased breath sounds, no wheezing Reg s1/s2 no gallop Soft benign +bs No edema Aaoxox3  Mood and affect appropriate in current setting    Data Reviewed: I have personally reviewed following labs and imaging studies  CBC: Recent Labs  Lab 06/21/21 0449 06/22/21 0744 06/23/21 0241 06/24/21 0150 06/25/21 0329  WBC 11.2* 10.6* 10.3 8.0 6.3  HGB 12.9* 12.4* 11.8* 11.5* 12.7*  HCT 37.7* 36.7* 34.1* 34.6* 37.9*  MCV 103.6* 102.8* 102.1* 103.9* 106.8*  PLT 146* 136* 126* 155 [id number]   Basic Metabolic Panel: Recent Labs  Lab 06/19/21 1519 06/19/21 1711 06/20/21 0425 06/21/21 0449 06/23/21 0241 06/25/21 1155  NA 135  --  137 136  --  135  K 3.9  --  4.1 3.8 3.4* 4.2  CL 103  --  105 100  --  102  CO2 23  --  21* 24  --  24  GLUCOSE 137*  --  81 83  --  89  BUN 19  --  21 20  --  13  CREATININE 0.92  --  0.88 1.06 0.84 0.98  CALCIUM 8.6*  --  8.5* 8.6*  --  8.3*  MG  --  2.1  --   --   --   --    GFR:  Estimated Creatinine Clearance: 82.2 mL/min (by C-G formula based on SCr of 0.98 mg/dL). Liver Function Tests: No results for input(s): AST, ALT, ALKPHOS, BILITOT, PROT, ALBUMIN in the last 168 hours. No results for input(s): LIPASE, AMYLASE in the last 168 hours. No results for input(s): AMMONIA in the last 168 hours. Coagulation Profile: Recent Labs  Lab 06/22/21 0744 06/23/21 0241 06/24/21 0150 06/25/21 0329 06/25/21 1244  INR 1.1 1.3* 1.6* 1.8* 1.8*   Cardiac Enzymes: No results for input(s): CKTOTAL, CKMB, CKMBINDEX, TROPONINI in the last 168 hours. BNP (last 3 results) No results for input(s): PROBNP in the last 8760 hours. HbA1C: No results for input(s): HGBA1C in the last 72 hours. CBG: Recent Labs  Lab 06/20/21 0735 06/20/21 1152 06/20/21 1728 06/23/21 1201 06/23/21 1620  GLUCAP 90 107* 104* 124* 114*   Lipid Profile: No results for input(s): CHOL, HDL, LDLCALC,  TRIG, CHOLHDL, LDLDIRECT in the last 72 hours.  Thyroid Function Tests: No results for input(s): TSH, T4TOTAL, FREET4, T3FREE, THYROIDAB in the last 72 hours. Anemia Panel: No results for input(s): VITAMINB12, FOLATE, FERRITIN, TIBC, IRON, RETICCTPCT in the last 72 hours. Sepsis Labs: No results for input(s): PROCALCITON, LATICACIDVEN in the last 168 hours.  Recent Results (from the past 240 hour(s))  Resp Panel by RT-PCR (Flu A&B, Covid) Nasopharyngeal Swab     Status: None   Collection Time: 06/19/21 10:29 PM   Specimen: Nasopharyngeal Swab; Nasopharyngeal(NP) swabs in vial transport medium  Result Value Ref Range Status   SARS Coronavirus 2 by RT PCR NEGATIVE NEGATIVE Final    Comment: (NOTE) SARS-CoV-2 target nucleic acids are NOT DETECTED.  The SARS-CoV-2 RNA is generally detectable in upper respiratory specimens during the acute phase of infection. The lowest concentration of SARS-CoV-2 viral copies this assay can detect is 138 copies/mL. A negative result does not preclude SARS-Cov-2 infection and should not be used as the sole basis for treatment or other patient management decisions. A negative result may occur with  improper specimen collection/handling, submission of specimen other than nasopharyngeal swab, presence of viral mutation(s) within the areas targeted by this assay, and inadequate number of viral copies(<138 copies/mL). A negative result must be combined with clinical observations, patient history, and epidemiological information. The expected result is Negative.  Fact Sheet for Patients:  EntrepreneurPulse.com.au  Fact Sheet for Healthcare Providers:  IncredibleEmployment.be  This test is no t yet approved or cleared by the [place] FDA and  has been authorized for detection and/or diagnosis of SARS-CoV-2 by FDA under an Emergency Use Authorization (EUA). This EUA will remain  in effect (meaning this test can be  used) for the duration of the COVID-19 declaration under Section 564(b)(1) of the Act, 21 U.S.C.section 360bbb-3(b)(1), unless the authorization is terminated  or revoked sooner.       Influenza A by PCR NEGATIVE NEGATIVE Final   Influenza B by PCR NEGATIVE NEGATIVE Final    Comment: (NOTE) The Xpert Xpress SARS-CoV-2/FLU/RSV plus assay is intended as an aid in the diagnosis of influenza from Nasopharyngeal swab specimens and should not be used as a sole basis for treatment. Nasal washings and aspirates are unacceptable for Xpert Xpress SARS-CoV-2/FLU/RSV testing.  Fact Sheet for Patients: EntrepreneurPulse.com.au  Fact Sheet for Healthcare Providers: IncredibleEmployment.be  This test is not yet approved or cleared by the [place] FDA and has been authorized for detection and/or diagnosis of SARS-CoV-2 by FDA under an Emergency Use Authorization (EUA). This EUA will remain in effect (meaning this test can  be used) for the duration of the COVID-19 declaration under Section 564(b)(1) of the Act, 21 U.S.C. section 360bbb-3(b)(1), unless the authorization is terminated or revoked.  Performed at [facility], [place] [address] [place], [place] [address]          Radiology Studies: No results found.      Scheduled Meds:  diltiazem  120 mg Oral Daily   dronedarone  400 mg Oral BID WC   enoxaparin (LOVENOX) injection  95 mg Subcutaneous Q12H   fluticasone furoate-vilanterol  1 puff Inhalation Daily   And   umeclidinium bromide  1 puff Inhalation Daily   gabapentin  300 mg Oral QHS   insulin aspart  0-9 Units Subcutaneous TID WC   isosorbide mononitrate  30 mg Oral Daily   morphine  30 mg Oral Q12H   pantoprazole  40 mg Oral QAC breakfast   predniSONE  10 mg Oral Q breakfast   ranolazine  500 mg Oral BID   rosuvastatin  10 mg Oral Daily   sodium chloride flush  3 mL Intravenous Q12H   spironolactone  25 mg Oral Daily    Warfarin - Pharmacist Dosing Inpatient   Does not apply q1600   Continuous Infusions:    Assessment & Plan:   Principal Problem:   Multiple subsegmental pulmonary emboli without acute cor pulmonale (HCC) Active Problems:   Paroxysmal A-fib (HCC)   Essential hypertension   CAD (coronary artery disease)   Hyperlipidemia   Acute respiratory failure with hypoxia (HCC)   Acute on chronic heart failure with preserved ejection fraction (HCC)   Chronic obstructive pulmonary disease (HCC)   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HCC)   Anxiety   Acute pulmonary embolism Acute respiratory failure with hypoxia Acute resp. Failure due to copd exac, acute chf, and PE 3/2 heparin drip was switched to Lovenox due to bleed from IV site overnight Discussed Coumadin with patient and will consult pharmacy for Coumadin initiation since failed Xarelto Hematologist input was appreciated  CT  a/p without evidence of malignancy  Venous ultrasound negative for DVT  3/6 INR 1.8, goal 2-3 Continue Lovenox Obtain ambulatory oxygen testing Coumadin dosing per pharmacy       Atrial fibrillation Cardiology following Currently sinus rhythm, with multiple symptomatic episodes of A-fib benefits from rhythm control Continue multaq, insurance approved  3/6 continue Coumadin, diltiazem  Follow-up with cardiology as outpatient       CAD with stable Angina Continue Imdur, Ranexa and statins  3/6 holding beta-blockers, bp low   Hypokalemia Replaced on Aldactone   Acute on chronic HFpEF Echo with normal EF BNP was elevated from baseline Holding beta-blockers as above 3/4 clinically improving Continue Lasix p.o. Continue Aldactone    Leukocytosis Likely reactive Improving Afebrile  Elevated Troponin Nml ef , no wma     Hypertension - Continue diltiazem, Imdur, spironolactone  Hyperlipidemia - Continue home rosuvastatin   COPD - Replace home Trelegy  with formulary Breo and Incruse - As needed DuoNebs - Continue Daily Prednisone  Chronic pain - Continue home morphine and as needed oxycodone -Will need to confirm if he takes lubiprostone, Movantik or both, resume when confirmed by pharmacy   Anxiety Insomnia - Continue home as needed Klonopin - Continue home trazodone,    DM  - SSI   DVT prophylaxis: lovenox Code Status:full Family Communication: none at bedside Disposition Plan:  Status is: Inpatient Remains inpatient appropriate because: IV treatment. Needs inr to be therapeutic  since has b/l PE                LOS: 5 days   Time spent:35 min with >50% on coc    [name], MD [facility] Pager [phone]  If 7PM-7AM, please contact night-coverage 06/25/2021, 2:30 PM

## 2021-06-25 NOTE — Progress Notes (Signed)
Patient ambulated in the hall over 50 feet and maintained O2 of 94%.   ?

## 2021-06-25 NOTE — TOC Benefit Eligibility Note (Signed)
Patient Advocate Encounter ?  ?Insurance verification completed.   ?  ?The patient is currently admitted and upon discharge could be taking EFFEXOR 37.5MG  or 75MG . ?  ?The current 30 day co-pay is, $0.  ? ?The patient is insured through [employer]. ? ? ?  ? ?

## 2021-06-26 ENCOUNTER — Other Ambulatory Visit (HOSPITAL_COMMUNITY): Payer: Self-pay

## 2021-06-26 LAB — GLUCOSE, CAPILLARY: Glucose-Capillary: 99 mg/dL (ref 70–99)

## 2021-06-26 LAB — PROTIME-INR
INR: 2.1 — ABNORMAL HIGH (ref 0.8–1.2)
Prothrombin Time: 23.7 seconds — ABNORMAL HIGH (ref 11.4–15.2)

## 2021-06-26 MED ORDER — ISOSORBIDE MONONITRATE ER 30 MG PO TB24
30.0000 mg | ORAL_TABLET | Freq: Every day | ORAL | 0 refills | Status: AC
  Filled 2021-06-26: qty 30, 30d supply, fill #0

## 2021-06-26 MED ORDER — WARFARIN SODIUM 1 MG PO TABS
1.0000 mg | ORAL_TABLET | Freq: Every day | ORAL | 0 refills | Status: DC
  Filled 2021-06-26: qty 30, 30d supply, fill #0

## 2021-06-26 MED ORDER — SPIRONOLACTONE 25 MG PO TABS
25.0000 mg | ORAL_TABLET | Freq: Every day | ORAL | 0 refills | Status: DC
Start: 2021-06-26 — End: 2021-06-29
  Filled 2021-06-26: qty 30, 30d supply, fill #0

## 2021-06-26 MED ORDER — FUROSEMIDE 40 MG PO TABS
40.0000 mg | ORAL_TABLET | Freq: Two times a day (BID) | ORAL | 0 refills | Status: DC
  Filled 2021-06-26: qty 60, 30d supply, fill #0

## 2021-06-26 MED ORDER — WARFARIN SODIUM 10 MG PO TABS: 10.0000 mg | ORAL_TABLET | Freq: Once | ORAL | Status: DC

## 2021-06-26 MED ORDER — WARFARIN SODIUM 10 MG PO TABS
10.0000 mg | ORAL_TABLET | Freq: Every day | ORAL | 0 refills | Status: DC
Start: 2021-06-26 — End: 2021-07-09
  Filled 2021-06-26: qty 30, 30d supply, fill #0

## 2021-06-26 NOTE — Consult Note (Signed)
? ?  [facility] CM Inpatient Consult ? ? ?06/26/2021 ? ?Kevin Orozco ?Nov 08, 1955 ?[id number] ? ?Managed Medicaid: [employer] ? ?Extreme high risk score for unplanned readmission risk ? ?Met with the patient at the bedside for post hospital follow up needs.  Patient states he has a good place to live with a good roommate now. He states his PCP comes and does home visits for follow up. Patient wanted taxi for transitioning home. Spoke inpatient [facility] RNCM and she provided the name of [facility] [employer], [phone] and gave the patient the contact information requested. ? ?Plan: Will assign patient for post hospital follow up with the [facility] Care Management team. ? ?For questions, please contact: ? ?Natividad Brood, RN BSN CCM ?[facility] Liaison ? [phone] business mobile phone ?Toll free office [phone]  ?Fax number: [phone] ?[email or web address] ? ? ? ? ?

## 2021-06-26 NOTE — Progress Notes (Signed)
ANTICOAGULATION CONSULT NOTE - Follow Up Consult ? ?Pharmacy Consult for Lovenox, warfarin ?Indication: atrial fibrillation and pulmonary embolus ? ?Allergies  ?Allergen Reactions  ? Etomidate Anxiety  ? Tape Other (See Comments)  ?  SKIN WILL TEAR EASILY!!!!  ? ? ?Patient Measurements: ?Weight: 88 kg (194 lb 0.1 oz) ? ?Vital Signs: ?Temp: 98 ?F (36.7 ?C) (03/07 0518) ?Temp Source: Oral (03/07 0518) ?BP: 119/73 (03/07 0518) ?Pulse Rate: 59 (03/07 0716) ? ?Labs: ?Recent Labs  ?  06/24/21 ?0150 06/25/21 ?0329 06/25/21 ?1155 06/25/21 ?1244 06/26/21 ?0256  ?HGB 11.5* 12.7*  --   --   --   ?HCT 34.6* 37.9*  --   --   --   ?PLT 155 151  --   --   --   ?LABPROT 19.0* 21.1*  --  21.3* 23.7*  ?INR 1.6* 1.8*  --  1.8* 2.1*  ?CREATININE  --   --  0.98  --   --   ? ? ? ?Estimated Creatinine Clearance: 82.2 mL/min (by C-G formula based on SCr of 0.98 mg/dL). ? ?Assessment: ?66 yo male admitted 2/28 with shortness of breath and back pain. Patient with hx of afib on PTA Xarelto (last dose 2/27 PM). 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 Patient states he was taking it every day, but not always at the same time and not always with food.  ?Pharmacy consulted to dose Lovenox and warfarin for new bilateral subsegmental PE. ?Cardioversion on 2/28, was in normal sinus rhythm after that. ? ?INR trending up and therapeutic today at 2.1. Hgb and plts have been stable. No injection site or bleeding issues noted. PO intake 75-100% since admission. ?Patient received 12 mg of warfarin 3/6. ? ?Notable drug-drug interactions w/warfarin: dronedarone, note both are new and starting together. Expect decrease in warfarin dose may be needed after about a week. ? ? ?Goal of Therapy:  ?INR 2-3 ?Monitor platelets by anticoagulation protocol: Yes ?  ?Plan:  ?Continue Lovenox 95 mg subcutaneously q12h until INR therapeutic x 2 days ?Give warfarin 10 mg PO x 1 today  ?Monitor daily INR, CBC, clinical course, s/sx of bleed, PO intake/diet, Drug-Drug  Interactions ? ? ? ?Thank you for allowing [name] to participate in this patients care. ?Jens Som, PharmD ?06/26/2021 8:10 AM ? ?**Pharmacist phone directory can be found on [email or web address] listed under [place]** ? ?

## 2021-06-26 NOTE — Progress Notes (Signed)
Mobility Specialist Progress Note: ? ? 06/26/21 1015  ?Mobility  ?Activity Ambulated with assistance in hallway  ?Level of Assistance Independent  ?Assistive Device None  ?Distance Ambulated (ft) 470 ft  ?Activity Response Tolerated well  ?$Mobility charge 1 Mobility  ? ?Pt asx during ambulation. No SOB or dyspnea noted. Pt back in bed with all needs met.  ? ?Nelta Numbers ?Acute Rehab ?Phone: [phone] ?Office Phone: [phone] ? ?

## 2021-06-26 NOTE — Discharge Summary (Signed)
Kevin Orozco [id number] DOB: Mar 21, 1956 DOA: 06/19/2021  PCP: [name], NP  Admit date: 06/19/2021 Discharge date: 06/26/2021  Admitted From: home Disposition:  home  Recommendations for Outpatient Follow-up:  Follow up with PCP in 1 week Please obtain BMP/CBC in one week Follow-up with cardiology as outpatient       Discharge Condition:Stable CODE STATUS:full  Diet recommendation: Heart Healthy  Brief/Interim Summary: Per [id number] Kevin Orozco is a 66 y.o. male with medical history significant of substance use, atrial fibrillation, hyperlipidemia, hypertension, diabetes, claudication, COPD, CHF, CAD, hepatitis C, chronic pain presenting with new pain and shortness of breath.  He was found with PE. Was on 15L [place], placed on bipap and then transition to room air.  As outpatient he was on Xarelto, hematology felt he failed this and recommended warfarin.  He was started on heparin drip with initiation of Coumadin until his goal was reached between 2-3.  He was also found with multiple episodes of going into atrial fibrillation RVR.  Cardiology was consulted.  They started patient on Multaq in sinus rhythm.  He was also found in heart failure with elevated BNP.  Was diuresed.  And transition to p.o. Lasix.  Echo revealed normal EF. Today his INR is therapeutic. He is stable for discharge.  On ambulation on room air he was sats were above 92%.  Acute pulmonary embolism Acute respiratory failure with hypoxia Acute resp. Failure due to copd exac, acute chf, and PE initiation on coumadin since failed Xarelto Hematologist was following  CT  a/p without evidence of malignancy  Venous ultrasound negative for DVT  INR therapeutic was more than 92%           Atrial fibrillation Cardiology consulted Currently sinus rhythm, with multiple symptomatic episodes of A-fib benefits from rhythm control Started on Multaq  Continue diltiazem and Coumadin  Follow-up cardiology as outpatient          CAD with stable Angina Continue Imdur, Ranexa and statins       Hypokalemia Replaced also on Aldactone     Acute on chronic HFpEF Echo with normal EF BNP was elevated from baseline Clinically improved Continue Lasix and Aldactone      Leukocytosis Likely reactive Improved Afebrile   Elevated Troponin Nml ef , no wma       Hypertension - Continue diltiazem, Imdur, spironolactone  Hyperlipidemia - Continue rosuvastatin   COPD Continue daily prednisone Continue home inhalers   Chronic pain - Continue home morphine and as needed oxycodone Follow-up PCP   Anxiety Insomnia Continue home meds   DM        Discharge Diagnoses:  Principal Problem:   Multiple subsegmental pulmonary emboli without acute cor pulmonale (HCC) Active Problems:   Paroxysmal A-fib (HCC)   Essential hypertension   CAD (coronary artery disease)   Hyperlipidemia   Acute respiratory failure with hypoxia (HCC)   Acute on chronic heart failure with preserved ejection fraction (HCC)   Chronic obstructive pulmonary disease (HCC)   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HCC)   Anxiety    Discharge Instructions  Discharge Instructions     AMB Referral to Community Care Coordinaton   Complete by: As directed    Managed Medicaid:  Please assigned to RN for post hospital follow up for chronic Care Management of disease management needs.  For questions, please contact:  Natividad Brood, RN BSN [facility] Liaison  [phone] business mobile phone Toll free office [phone]  Fax number:  [email or web address]   Reason for Referral: Care Coordination (Managed Medicaid)   MM Services needed: Nurse Case Manager   Diagnoses of:  High-risk Medicaid Diabetes     Expected date of contact: Emergent - 3 Days   Call MD for:  difficulty breathing, headache or  visual disturbances   Complete by: As directed    Diet - low sodium heart healthy   Complete by: As directed    Discharge instructions   Complete by: As directed    Get inr check in 2 days. F/u with pcp right away F/u with cardiology   Increase activity slowly   Complete by: As directed       Allergies as of 06/26/2021       Reactions   Etomidate Anxiety   Tape Other (See Comments)   SKIN WILL TEAR EASILY!!!!        Medication List     STOP taking these medications    diclofenac sodium 1 % Gel Commonly known as: VOLTAREN   finasteride 5 MG tablet Commonly known as: PROSCAR   metoprolol 200 MG 24 hr tablet Commonly known as: TOPROL-XL   tamsulosin 0.4 MG Caps capsule Commonly known as: FLOMAX   traZODone 100 MG tablet Commonly known as: DESYREL   Xarelto 20 MG Tabs tablet Generic drug: rivaroxaban       TAKE these medications    clonazePAM 0.5 MG tablet Commonly known as: KLONOPIN Take 0.5-1 mg by mouth See admin instructions. 05.mg bid and 1mg  every evening as needed for anxiety.   diltiazem 120 MG 24 hr capsule Commonly known as: CARDIZEM CD Take 1 capsule (120 mg total) by mouth daily.   furosemide 40 MG tablet Commonly known as: LASIX Take 1 tablet (40 mg total) by mouth 2 (two) times daily. What changed:  when to take this reasons to take this additional instructions   gabapentin 300 MG capsule Commonly known as: NEURONTIN Take 300 mg by mouth at bedtime.   ipratropium-albuterol 0.5-2.5 (3) MG/3ML Soln Commonly known as: DUONEB Take 3 mLs by nebulization 4 (four) times daily as needed (for shortness of breath or wheezing).   isosorbide mononitrate 30 MG 24 hr tablet Commonly known as: IMDUR Take 1 tablet (30 mg total) by mouth daily.   Kloxxado 8 MG/0.1ML Liqd Generic drug: Naloxone HCl Place into the nose as needed (for accidental overdose).   morphine 30 MG 12 hr tablet Commonly known as: MS CONTIN Take 30 mg by mouth every 12  (twelve) hours.   Movantik 12.5 MG Tabs tablet Generic drug: naloxegol oxalate Take 12.5 mg by mouth every morning.   Multaq 400 MG tablet Generic drug: dronedarone Take 1 tablet (400 mg total) by mouth 2 (two) times daily with a meal.   nitroGLYCERIN 0.4 MG SL tablet Commonly known as: NITROSTAT Place under the tongue every 5 (five) minutes as needed for chest pain.   oxyCODONE-acetaminophen 10-325 MG tablet Commonly known as: PERCOCET Take 1 tablet by mouth every 4 (four) hours as needed for pain.   pantoprazole 40 MG tablet Commonly known as: PROTONIX Take 40 mg by mouth daily before breakfast.   predniSONE 10 MG tablet Commonly known as: DELTASONE Take 10 mg by mouth daily with breakfast. Continuous.   ranolazine 500 MG 12 hr tablet Commonly known as: RANEXA TAKE ONE TABLET BY MOUTH TWICE DAILY What changed: when to take this   rosuvastatin 10 MG tablet Commonly known as: CRESTOR Take 1 tablet (10 mg total) by  mouth daily.   sertraline 50 MG tablet Commonly known as: ZOLOFT Take 50 mg by mouth daily.   spironolactone 25 MG tablet Commonly known as: ALDACTONE Take 1 tablet (25 mg total) by mouth daily.   Trelegy Ellipta 200-62.5-25 MCG/ACT Aepb Generic drug: Fluticasone-Umeclidin-Vilant Inhale 1 puff into the lungs daily.   warfarin 10 MG tablet Commonly known as: Coumadin Take 1 tablet (10 mg total) by mouth daily.   warfarin 1 MG tablet Commonly known as: Coumadin Take 1 tablet (1 mg total) by mouth daily.        Follow-up Information     Cantwell, Celeste C, PA-C. Go on 06/29/2021.   Specialty: Cardiology Why: Appointment at 11:30 AM.  Please arrive 15 minutes early and bring all medications with you for review Contact information: [place] [id number] [phone]         [name], NP Follow up on 06/27/2021.   Specialty: Nurse Practitioner Why: Please keep appointment Contact information: [place] [phone]                Allergies  Allergen Reactions   Etomidate Anxiety   Tape Other (See Comments)    SKIN WILL TEAR EASILY!!!!    Consultations: Cardiology, hematology   Procedures/Studies: DG Chest 2 View  Result Date: 06/19/2021 CLINICAL DATA:  Shortness of breath. EXAM: CHEST - 2 VIEW COMPARISON:  February 23, 2021. FINDINGS: Stable cardiomediastinal silhouette. Increased bibasilar atelectasis or edema is noted with small bilateral pleural effusions. Bony thorax is unremarkable. IMPRESSION: Increased bibasilar atelectasis or edema is noted with small bilateral pleural effusions. Electronically Signed   By: Marijo Conception M.D.   On: 06/19/2021 15:55   CT Angio Chest PE W and/or Wo Contrast  Result Date: 06/19/2021 CLINICAL DATA:  Pulmonary embolism suspected, high probability. EXAM: CT ANGIOGRAPHY CHEST WITH CONTRAST TECHNIQUE: Multidetector CT imaging of the chest was performed using the standard protocol during bolus administration of intravenous contrast. Multiplanar CT image reconstructions and MIPs were obtained to evaluate the vascular anatomy. RADIATION DOSE REDUCTION: This exam was performed according to the departmental dose-optimization program which includes automated exposure control, adjustment of the mA and/or kV according to patient size and/or use of iterative reconstruction technique. CONTRAST:  72mL OMNIPAQUE IOHEXOL 350 MG/ML SOLN COMPARISON:  None. FINDINGS: Cardiovascular: The heart is enlarged and there is no pericardial effusion. Multi-vessel coronary artery calcifications are noted. There is atherosclerotic calcification of the aorta with mild aneurysmal dilatation of the ascending aorta measuring 4.1 cm. The pulmonary trunk is distended suggesting underlying pulmonary artery hypertension. Examination of the pulmonary arteries is limited due to respiratory motion artifact. Pulmonary artery filling  defects are present in the segmental and subsegmental arteries in the left upper and lower lobes and right middle and lower lobes. No evidence of right heart strain. Mediastinum/Nodes: Shotty lymph nodes are present in the mediastinum and hilar regions bilaterally which may be reactive. No axillary lymphadenopathy. The thyroid gland, trachea, and esophagus are within normal limits. A few prominent lymph nodes are present in the periesophageal fat, not well evaluated due to respiratory motion artifact. Lungs/Pleura: Hazy attenuation is noted in the lungs bilaterally, greater on the right than on the left. There is patchy atelectasis or infiltrate at the lung bases. Small bilateral pleural effusions are noted. No pneumothorax. Upper Abdomen: The liver has a nodular contour suggesting underlying cirrhosis. There is reflux of contrast  into the inferior vena cava and hepatic veins suggesting right heart failure. Cysts are noted in the left kidney. No acute abnormality. Musculoskeletal: No acute osseous abnormality. Review of the MIP images confirms the above findings. IMPRESSION: 1. Bilateral segmental and subsegmental pulmonary emboli. No evidence of right heart strain. 2. Distended pulmonary trunk which may be associated with underlying pulmonary artery hypertension. Reflux of contrast into the inferior vena cava and hepatic veins may be associated with right heart failure. 3. Hazy attenuation of the lungs bilaterally with patchy airspace disease in the lower lobes, possible edema, atelectasis, and/or infiltrate. 4. Small bilateral pleural effusions. 5. Aortic atherosclerosis with aneurysmal dilatation of the ascending aorta measuring up to 4.1 cm. Recommend annual imaging followup by CTA or MRA. This recommendation follows 2010 ACCF/AHA/AATS/ACR/ASA/SCA/SCAI/SIR/STS/SVM Guidelines for the Diagnosis and Management of Patients with Thoracic Aortic Disease. Circulation. 2010; [id number]. Aortic aneurysm NOS  (ICD10-I71.9) 6. Cardiomegaly with coronary artery calcifications. 7. Slightly nodular contour of the liver, suggesting underlying cirrhosis. 8. Left renal cysts. Critical findings were reported to Dr. Laverta Baltimore at 10:29 p.m. Electronically Signed   By: Brett Fairy M.D.   On: 06/19/2021 22:30   CT ABDOMEN PELVIS W CONTRAST  Result Date: 06/21/2021 CLINICAL DATA:  Evaluate for occult malignancy EXAM: CT ABDOMEN AND PELVIS WITH CONTRAST TECHNIQUE: Multidetector CT imaging of the abdomen and pelvis was performed using the standard protocol following bolus administration of intravenous contrast. RADIATION DOSE REDUCTION: This exam was performed according to the departmental dose-optimization program which includes automated exposure control, adjustment of the mA and/or kV according to patient size and/or use of iterative reconstruction technique. CONTRAST:  148mL OMNIPAQUE IOHEXOL 300 MG/ML  SOLN COMPARISON:  Sonogram done on 07/09/2017 FINDINGS: Lower chest: Heart is enlarged in size. Small bilateral pleural effusions are seen, more so on the right side. There are patchy infiltrates in the posterior lower lung fields, possibly atelectasis. Possibility of pneumonia is not excluded. Coronary artery calcifications are seen. There is dense pericardial calcification. Hepatobiliary: There is nodularity in the liver surface. No focal abnormality is seen. Gallbladder is unremarkable. Pancreas: No focal abnormality is seen. Spleen: Unremarkable. Adrenals/Urinary Tract: Adrenals are not enlarged. There is no hydronephrosis. There are scattered calcifications in the renal artery branches. There are multiple smooth marginated low-density lesions in the renal cortex largest measuring 4.3 cm in the upper pole of left kidney. Ureters are not dilated. Urinary bladder is unremarkable. Stomach/Bowel: Small hiatal hernia is seen. Stomach is not distended. Small bowel loops are not dilated. Appendix is not seen. There is no focal  pericecal inflammation. There is no significant wall thickening in colon. There is no pericolic stranding or fluid collection. Vascular/Lymphatic: Dense calcifications are seen in the aorta and its major branches. Reproductive: Unremarkable. Other: There is no ascites or pneumoperitoneum. Bilateral inguinal hernias containing fat are seen, larger on the right side. Umbilical and paraumbilical hernias containing fat are noted. There is small ventral hernia containing fat slightly superior to the umbilicus. Musculoskeletal: Degenerative changes are noted in the lumbar spine with encroachment of neural foramina at multiple levels, more so at L4-L5 and L5-S1 levels. Schmorl's nodes are seen in the multiple vertebral bodies. IMPRESSION: There is no evidence of intestinal obstruction or pneumoperitoneum. There is no hydronephrosis. Bilateral pleural effusions, more so on the right side. Infiltrates in the both lower lung fields suggest atelectasis or pneumonia. Nodularity in the liver surface suggests possible cirrhosis. Small hiatal hernia. Bilateral renal cysts. Other findings as described in the body of  the report. Electronically Signed   By: Ernie Avena M.D.   On: 06/21/2021 12:35   ECHOCARDIOGRAM COMPLETE  Result Date: 06/20/2021    ECHOCARDIOGRAM REPORT   Patient Name:   Kevin Orozco Date of Exam: 06/20/2021 Medical Rec #:  [id number]      Height:       71.0 in Accession #:    [id number]     Weight:       205.0 lb Date of Birth:  1956/02/26      BSA:          2.131 m Patient Age:    65 years       BP:           132/81 mmHg Patient Gender: M              HR:           67 bpm. Exam Location:  Inpatient Procedure: 2D Echo Indications:    Acute respiratory distress  History:        Patient has prior history of Echocardiogram examinations, most                 recent 02/13/2021. CAD; Risk Factors:Diabetes and Hypertension.  Sonographer:    Devonne Doughty Referring Phys: [id number] CELESTE C CANTWELL  Sonographer  Comments: Image acquisition challenging due to patient body habitus and supine. IMPRESSIONS  1. Left ventricular ejection fraction, by estimation, is 60 to 65%. The left ventricle has normal function. The left ventricle has no regional wall motion abnormalities. There is mild left ventricular hypertrophy. Left ventricular diastolic parameters are indeterminate.  2. Right ventricular systolic function is moderately reduced. The right ventricular size is normal.  3. Left atrial size was severely dilated.  4. Right atrial size was moderately dilated.  5. The mitral valve is grossly normal. Mild mitral valve regurgitation.  6. The aortic valve is grossly normal. Aortic valve regurgitation is trivial. FINDINGS  Left Ventricle: Left ventricular ejection fraction, by estimation, is 60 to 65%. The left ventricle has normal function. The left ventricle has no regional wall motion abnormalities. The left ventricular internal cavity size was normal in size. There is  mild left ventricular hypertrophy. Left ventricular diastolic parameters are indeterminate. Right Ventricle: The right ventricular size is normal. Right vetricular wall thickness was not well visualized. Right ventricular systolic function is moderately reduced. Left Atrium: Left atrial size was severely dilated. Right Atrium: Right atrial size was moderately dilated. Pericardium: There is no evidence of pericardial effusion. Mitral Valve: The mitral valve is grossly normal. Mild mitral valve regurgitation. Tricuspid Valve: The tricuspid valve is grossly normal. Tricuspid valve regurgitation is not demonstrated. Aortic Valve: The aortic valve is grossly normal. Aortic valve regurgitation is trivial. Aortic regurgitation PHT measures 535 msec. Aortic valve mean gradient measures 3.0 mmHg. Aortic valve peak gradient measures 5.7 mmHg. Aortic valve area, by VTI measures 3.21 cm. Pulmonic Valve: The pulmonic valve was not well visualized. Pulmonic valve regurgitation  is not visualized. Aorta: The aortic root and ascending aorta are structurally normal, with no evidence of dilitation. IAS/Shunts: The atrial septum is grossly normal.  LEFT VENTRICLE PLAX 2D LVIDd:         4.30 cm   Diastology LVIDs:         3.00 cm   LV e' medial:    7.72 cm/s LV PW:         1.30 cm   LV E/e' medial:  13.2 LV IVS:  1.30 cm   LV e' lateral:   8.81 cm/s LVOT diam:     2.00 cm   LV E/e' lateral: 11.6 LV SV:         84 LV SV Index:   39 LVOT Area:     3.14 cm  RIGHT VENTRICLE             IVC RV Basal diam:  3.70 cm     IVC diam: 2.70 cm RV Mid diam:    2.90 cm RV S prime:     10.10 cm/s TAPSE (M-mode): 1.2 cm LEFT ATRIUM              Index        RIGHT ATRIUM           Index LA diam:        4.50 cm  2.11 cm/m   RA Area:     25.20 cm LA Vol (A2C):   120.0 ml 56.32 ml/m  RA Volume:   81.00 ml  38.01 ml/m LA Vol (A4C):   90.8 ml  42.61 ml/m LA Biplane Vol: 106.0 ml 49.75 ml/m  AORTIC VALVE AV Area (Vmax):    3.25 cm AV Area (Vmean):   3.09 cm AV Area (VTI):     3.21 cm AV Vmax:           119.00 cm/s AV Vmean:          84.500 cm/s AV VTI:            0.260 m AV Peak Grad:      5.7 mmHg AV Mean Grad:      3.0 mmHg LVOT Vmax:         123.00 cm/s LVOT Vmean:        83.200 cm/s LVOT VTI:          0.266 m LVOT/AV VTI ratio: 1.02 AI PHT:            535 msec  AORTA Ao Root diam: 3.70 cm Ao Asc diam:  3.80 cm MITRAL VALVE MV Area (PHT): 3.53 cm     SHUNTS MV Decel Time: 215 msec     Systemic VTI:  0.27 m MV E velocity: 102.00 cm/s  Systemic Diam: 2.00 cm MV A velocity: 74.10 cm/s MV E/A ratio:  1.38 Kristeen Miss MD Electronically signed by Kristeen Miss MD Signature Date/Time: 06/20/2021/4:07:03 PM    Final    VAS [name] LOWER EXTREMITY VENOUS (DVT)  Result Date: 06/21/2021  Lower Venous DVT Study Patient Name:  Kevin Orozco  Date of Exam:   06/21/2021 Medical Rec #: [id number]       Accession #:    [id number] Date of Birth: 02-15-1956       Patient Gender: M Patient Age:   49 years Exam Location:   [facility] Procedure:      VAS [name] LOWER EXTREMITY VENOUS (DVT) Referring Phys: Lynn Ito --------------------------------------------------------------------------------  Indications: Pulmonary embolism.  Comparison Study: no prior Performing Technologist: [place] Ponder RVS  Examination Guidelines: A complete evaluation includes B-mode imaging, spectral Doppler, color Doppler, and power Doppler as needed of all accessible portions of each vessel. Bilateral testing is considered an integral part of a complete examination. Limited examinations for reoccurring indications may be performed as noted. The reflux portion of the exam is performed with the patient in reverse Trendelenburg.  +---------+---------------+---------+-----------+----------+--------------+  RIGHT     Compressibility Phasicity Spontaneity Properties Thrombus Aging  +---------+---------------+---------+-----------+----------+--------------+  CFV  Full            Yes       Yes                                    +---------+---------------+---------+-----------+----------+--------------+  SFJ       Full                                                             +---------+---------------+---------+-----------+----------+--------------+  FV Prox   Full                                                             +---------+---------------+---------+-----------+----------+--------------+  FV Mid    Full                                                             +---------+---------------+---------+-----------+----------+--------------+  FV Distal Full                                                             +---------+---------------+---------+-----------+----------+--------------+  PFV       Full                                                             +---------+---------------+---------+-----------+----------+--------------+  POP       Full            Yes       Yes                                     +---------+---------------+---------+-----------+----------+--------------+  PTV       Full                                                             +---------+---------------+---------+-----------+----------+--------------+  PERO      Full                                                             +---------+---------------+---------+-----------+----------+--------------+   +---------+---------------+---------+-----------+----------+--------------+  LEFT      Compressibility Phasicity Spontaneity Properties Thrombus Aging  +---------+---------------+---------+-----------+----------+--------------+  CFV       Full            Yes       Yes                                    +---------+---------------+---------+-----------+----------+--------------+  SFJ       Full                                                             +---------+---------------+---------+-----------+----------+--------------+  FV Prox   Full                                                             +---------+---------------+---------+-----------+----------+--------------+  FV Mid    Full                                                             +---------+---------------+---------+-----------+----------+--------------+  FV Distal Full                                                             +---------+---------------+---------+-----------+----------+--------------+  PFV       Full                                                             +---------+---------------+---------+-----------+----------+--------------+  POP       Full            Yes       Yes                                    +---------+---------------+---------+-----------+----------+--------------+  PTV       Full                                                             +---------+---------------+---------+-----------+----------+--------------+  PERO      Full                                                              +---------+---------------+---------+-----------+----------+--------------+  Summary: BILATERAL: - No evidence of deep vein thrombosis seen in the lower extremities, bilaterally. -No evidence of popliteal cyst, bilaterally.   *See table(s) above for measurements and observations. Electronically signed by Jamelle Haring on 06/21/2021 at 3:46:19 PM.    Final       Subjective: Feels well.  No shortness of breath or chest pain.  Anxious to get home.  Discharge Exam: Vitals:   06/26/21 0716 06/26/21 0810  BP:  126/70  Pulse: (!) 59 71  Resp:  17  Temp:  97.9 F (36.6 C)  SpO2: 95%    Vitals:   06/25/21 2330 06/26/21 0518 06/26/21 0716 06/26/21 0810  BP: 129/76 119/73  126/70  Pulse:  (!) 58 (!) 59 71  Resp: 16 13  17   Temp: 97.8 F (36.6 C) 98 F (36.7 C)  97.9 F (36.6 C)  TempSrc: Oral Oral  Oral  SpO2: 97% 95% 95%   Weight:  88 kg      General: Pt is alert, awake, not in acute distress Cardiovascular: RRR, S1/S2 +, no rubs, no gallops Respiratory: CTA bilaterally, no wheezing, no rhonchi Abdominal: Soft, NT, ND, bowel sounds + Extremities: no edema    The results of significant diagnostics from this hospitalization (including imaging, microbiology, ancillary and laboratory) are listed below for reference.     Microbiology: Recent Results (from the past 240 hour(s))  Resp Panel by RT-PCR (Flu A&B, Covid) Nasopharyngeal Swab     Status: None   Collection Time: 06/19/21 10:29 PM   Specimen: Nasopharyngeal Swab; Nasopharyngeal(NP) swabs in vial transport medium  Result Value Ref Range Status   SARS Coronavirus 2 by RT PCR NEGATIVE NEGATIVE Final    Comment: (NOTE) SARS-CoV-2 target nucleic acids are NOT DETECTED.  The SARS-CoV-2 RNA is generally detectable in upper respiratory specimens during the acute phase of infection. The lowest concentration of SARS-CoV-2 viral copies this assay can detect is 138 copies/mL. A negative result does not preclude  SARS-Cov-2 infection and should not be used as the sole basis for treatment or other patient management decisions. A negative result may occur with  improper specimen collection/handling, submission of specimen other than nasopharyngeal swab, presence of viral mutation(s) within the areas targeted by this assay, and inadequate number of viral copies(<138 copies/mL). A negative result must be combined with clinical observations, patient history, and epidemiological information. The expected result is Negative.  Fact Sheet for Patients:  EntrepreneurPulse.com.au  Fact Sheet for Healthcare Providers:  IncredibleEmployment.be  This test is no t yet approved or cleared by the [place] FDA and  has been authorized for detection and/or diagnosis of SARS-CoV-2 by FDA under an Emergency Use Authorization (EUA). This EUA will remain  in effect (meaning this test can be used) for the duration of the COVID-19 declaration under Section 564(b)(1) of the Act, 21 U.S.C.section 360bbb-3(b)(1), unless the authorization is terminated  or revoked sooner.       Influenza A by PCR NEGATIVE NEGATIVE Final   Influenza B by PCR NEGATIVE NEGATIVE Final    Comment: (NOTE) The Xpert Xpress SARS-CoV-2/FLU/RSV plus assay is intended as an aid in the diagnosis of influenza from Nasopharyngeal swab specimens and should not be used as a sole basis for treatment. Nasal washings and aspirates are unacceptable for Xpert Xpress SARS-CoV-2/FLU/RSV testing.  Fact Sheet for Patients: EntrepreneurPulse.com.au  Fact Sheet for Healthcare Providers: IncredibleEmployment.be  This test is not yet approved or cleared by the [place] FDA and has been authorized for detection  and/or diagnosis of SARS-CoV-2 by FDA under an Emergency Use Authorization (EUA). This EUA will remain in effect (meaning this test can be used) for the duration of  the COVID-19 declaration under Section 564(b)(1) of the Act, 21 U.S.C. section 360bbb-3(b)(1), unless the authorization is terminated or revoked.  Performed at [facility], [place] [address], [place], [place] [address]      Labs: BNP (last 3 results) Recent Labs    02/23/21 1221 06/19/21 2238  BNP 510.6* [id number]*   Basic Metabolic Panel: Recent Labs  Lab 06/19/21 1711 06/20/21 0425 06/21/21 0449 06/23/21 0241 06/25/21 1155  NA  --  137 136  --  135  K  --  4.1 3.8 3.4* 4.2  CL  --  105 100  --  102  CO2  --  21* 24  --  24  GLUCOSE  --  81 83  --  89  BUN  --  21 20  --  13  CREATININE  --  0.88 1.06 0.84 0.98  CALCIUM  --  8.5* 8.6*  --  8.3*  MG 2.1  --   --   --   --    Liver Function Tests: No results for input(s): AST, ALT, ALKPHOS, BILITOT, PROT, ALBUMIN in the last 168 hours. No results for input(s): LIPASE, AMYLASE in the last 168 hours. No results for input(s): AMMONIA in the last 168 hours. CBC: Recent Labs  Lab 06/21/21 0449 06/22/21 0744 06/23/21 0241 06/24/21 0150 06/25/21 0329  WBC 11.2* 10.6* 10.3 8.0 6.3  HGB 12.9* 12.4* 11.8* 11.5* 12.7*  HCT 37.7* 36.7* 34.1* 34.6* 37.9*  MCV 103.6* 102.8* 102.1* 103.9* 106.8*  PLT 146* 136* 126* 155 151   Cardiac Enzymes: No results for input(s): CKTOTAL, CKMB, CKMBINDEX, TROPONINI in the last 168 hours. BNP: Invalid input(s): POCBNP CBG: Recent Labs  Lab 06/23/21 1201 06/23/21 1620 06/25/21 1603 06/25/21 2028 06/26/21 0609  GLUCAP 124* 114* 113* 102* 99   D-Dimer No results for input(s): DDIMER in the last 72 hours. Hgb A1c No results for input(s): HGBA1C in the last 72 hours. Lipid Profile No results for input(s): CHOL, HDL, LDLCALC, TRIG, CHOLHDL, LDLDIRECT in the last 72 hours. Thyroid function studies No results for input(s): TSH, T4TOTAL, T3FREE, THYROIDAB in the last 72 hours.  Invalid input(s): FREET3 Anemia work up No results for input(s): VITAMINB12, FOLATE, FERRITIN, TIBC,  IRON, RETICCTPCT in the last 72 hours. Urinalysis No results found for: COLORURINE, APPEARANCEUR, [place], [place], GLUCOSEU, [place], [place], [place], PROTEINUR, UROBILINOGEN, NITRITE, LEUKOCYTESUR Sepsis Labs Invalid input(s): PROCALCITONIN,  WBC,  LACTICIDVEN Microbiology Recent Results (from the past 240 hour(s))  Resp Panel by RT-PCR (Flu A&B, Covid) Nasopharyngeal Swab     Status: None   Collection Time: 06/19/21 10:29 PM   Specimen: Nasopharyngeal Swab; Nasopharyngeal(NP) swabs in vial transport medium  Result Value Ref Range Status   SARS Coronavirus 2 by RT PCR NEGATIVE NEGATIVE Final    Comment: (NOTE) SARS-CoV-2 target nucleic acids are NOT DETECTED.  The SARS-CoV-2 RNA is generally detectable in upper respiratory specimens during the acute phase of infection. The lowest concentration of SARS-CoV-2 viral copies this assay can detect is 138 copies/mL. A negative result does not preclude SARS-Cov-2 infection and should not be used as the sole basis for treatment or other patient management decisions. A negative result may occur with  improper specimen collection/handling, submission of specimen other than nasopharyngeal swab, presence of viral mutation(s) within the areas targeted by this assay, and  inadequate number of viral copies(<138 copies/mL). A negative result must be combined with clinical observations, patient history, and epidemiological information. The expected result is Negative.  Fact Sheet for Patients:  EntrepreneurPulse.com.au  Fact Sheet for Healthcare Providers:  IncredibleEmployment.be  This test is no t yet approved or cleared by the [place] FDA and  has been authorized for detection and/or diagnosis of SARS-CoV-2 by FDA under an Emergency Use Authorization (EUA). This EUA will remain  in effect (meaning this test can be used) for the duration of the COVID-19 declaration under Section 564(b)(1) of the  Act, 21 U.S.C.section 360bbb-3(b)(1), unless the authorization is terminated  or revoked sooner.       Influenza A by PCR NEGATIVE NEGATIVE Final   Influenza B by PCR NEGATIVE NEGATIVE Final    Comment: (NOTE) The Xpert Xpress SARS-CoV-2/FLU/RSV plus assay is intended as an aid in the diagnosis of influenza from Nasopharyngeal swab specimens and should not be used as a sole basis for treatment. Nasal washings and aspirates are unacceptable for Xpert Xpress SARS-CoV-2/FLU/RSV testing.  Fact Sheet for Patients: EntrepreneurPulse.com.au  Fact Sheet for Healthcare Providers: IncredibleEmployment.be  This test is not yet approved or cleared by the [place] FDA and has been authorized for detection and/or diagnosis of SARS-CoV-2 by FDA under an Emergency Use Authorization (EUA). This EUA will remain in effect (meaning this test can be used) for the duration of the COVID-19 declaration under Section 564(b)(1) of the Act, 21 U.S.C. section 360bbb-3(b)(1), unless the authorization is terminated or revoked.  Performed at [facility], [place] [address] [place], [place] [address]      Time coordinating discharge: Over 30 minutes  SIGNED:   [name], MD  [facility] 06/26/2021, 3:58 PM Pager   If 7PM-7AM, please contact night-coverage [email or web address] Password [email or web address]

## 2021-06-27 ENCOUNTER — Inpatient Hospital Stay (HOSPITAL_COMMUNITY): Admission: RE | Admit: 2021-06-27 | Payer: Medicaid Other | Source: Home / Self Care | Admitting: Cardiology

## 2021-06-27 ENCOUNTER — Encounter (HOSPITAL_COMMUNITY): Admission: RE | Payer: Self-pay | Source: Home / Self Care

## 2021-06-27 SURGERY — CARDIOVERSION
Anesthesia: General

## 2021-06-29 ENCOUNTER — Encounter: Payer: Self-pay | Admitting: Student

## 2021-06-29 ENCOUNTER — Other Ambulatory Visit: Payer: Self-pay

## 2021-06-29 ENCOUNTER — Ambulatory Visit: Payer: Medicaid Other | Admitting: Student

## 2021-06-29 VITALS — BP 92/46 | HR 61 | Temp 98.4°F | Resp 17 | Ht 71.0 in | Wt 200.0 lb

## 2021-06-29 DIAGNOSIS — I10 Essential (primary) hypertension: Secondary | ICD-10-CM

## 2021-06-29 DIAGNOSIS — I25118 Atherosclerotic heart disease of native coronary artery with other forms of angina pectoris: Secondary | ICD-10-CM

## 2021-06-29 DIAGNOSIS — I5032 Chronic diastolic (congestive) heart failure: Secondary | ICD-10-CM

## 2021-06-29 DIAGNOSIS — I480 Paroxysmal atrial fibrillation: Secondary | ICD-10-CM

## 2021-06-29 NOTE — Progress Notes (Signed)
Patient referred by [name], NP for chest pain  Subjective:   Kevin Orozco, male    DOB: 08-14-1955, 66 y.o.   MRN: [id number]   Chief Complaint  Patient presents with   Hospitalization Follow-up   Atrial Fibrillation     HPI  66 y.o. male with hypertension, paroxysmal Afib, moderate nonobstructive coronary artery disease (cath 2012), COPD, active smoker, prior h/o polysubstance abuse (EtOH, cocaine).  Patient has been unable to tolerate HCTZ in the past due to dizziness and falls.   Patient was diagnosed with HFrEF and A-fib with RVR in 01/2020, subsequently cardioverted in 02/2020.  He was then lost to follow-up until 01/2021 at which time patient admitted to medication noncompliance, therefore he was restarted on rate control medications given recurrence of A-fib with RVR.  He remained in A-fib with RVR and symptomatic, therefore underwent direct-current cardioversion 02/23/2021.   Patient was again lost to follow-up and presented to our office urgently 06/05/2021 with dyspnea on exertion and EKG revealing atrial fibrillation with RVR.  At that time patient was not taking Multaq.  He was restarted on Multaq and set up for direct-current cardioversion, however on 06/19/2021 patient presented to the emergency department and was subsequently admitted 06/20/2021 - 06/26/2021.  Patient was diagnosed with acute PE, likely he is noncompliant with Xarelto and was therefore switched to Coumadin.  Patient was also diuresed and underwent direct-current cardioversion 06/19/2021.  Patient was discharged with Lasix 40 mg p.o. twice daily, Multaq 400 mg p.o. twice daily, diltiazem 120 mg p.o. daily, as well as therapy for CAD including Imdur, Ranexa, statin therapy.   Patient now presents for follow-up.  He is using pill packs and reports medication compliance.  He has been followed by hematology and PCP for management of PE and Coumadin.  Patient reports dyspnea on exertion has significantly improved.   Denies chest pain.  He does report episodes of intermittent dizziness since discharge.  Notably his blood pressure is soft today.  Patient denies evidence of bleeding diathesis.  Unfortunately patient does continue to smoke approximately 1 pack/day.   Current Outpatient Medications on File Prior to Visit  Medication Sig Dispense Refill   clonazePAM (KLONOPIN) 0.5 MG tablet Take 0.5-1 mg by mouth See admin instructions. 05.mg bid and 1mg  every evening as needed for anxiety.     diltiazem (CARDIZEM CD) 120 MG 24 hr capsule Take 1 capsule (120 mg total) by mouth daily. 90 capsule 3   dronedarone (MULTAQ) 400 MG tablet Take 1 tablet (400 mg total) by mouth 2 (two) times daily with a meal. 60 tablet 2   finasteride (PROSCAR) 5 MG tablet Take 5 mg by mouth daily.     furosemide (LASIX) 40 MG tablet Take 1 tablet (40 mg total) by mouth 2 (two) times daily. 60 tablet 0   gabapentin (NEURONTIN) 300 MG capsule Take 300 mg by mouth at bedtime.     ipratropium-albuterol (DUONEB) 0.5-2.5 (3) MG/3ML SOLN Take 3 mLs by nebulization 4 (four) times daily as needed (for shortness of breath or wheezing).     isosorbide mononitrate (IMDUR) 30 MG 24 hr tablet Take 1 tablet (30 mg total) by mouth daily. 30 tablet 0   metoprolol (TOPROL-XL) 200 MG 24 hr tablet Take 200 mg by mouth daily.     morphine (MS CONTIN) 30 MG 12 hr tablet Take 30 mg by mouth every 12 (twelve) hours.     MOVANTIK 12.5 MG TABS tablet Take 12.5 mg by mouth every  morning.     Naloxone HCl (KLOXXADO) 8 MG/0.1ML LIQD Place into the nose as needed (for accidental overdose).     nitroGLYCERIN (NITROSTAT) 0.4 MG SL tablet Place under the tongue every 5 (five) minutes as needed for chest pain.     oxyCODONE-acetaminophen (PERCOCET) 10-325 MG tablet Take 1 tablet by mouth every 4 (four) hours as needed for pain.     pantoprazole (PROTONIX) 40 MG tablet Take 40 mg by mouth daily before breakfast.     predniSONE (DELTASONE) 10 MG tablet Take 10 mg by  mouth daily with breakfast. Continuous.     ranolazine (RANEXA) 500 MG 12 hr tablet TAKE ONE TABLET BY MOUTH TWICE DAILY (Patient taking differently: Take 500 mg by mouth in the morning and at bedtime.) 60 tablet 6   rosuvastatin (CRESTOR) 10 MG tablet Take 1 tablet (10 mg total) by mouth daily. 90 tablet 1   sertraline (ZOLOFT) 50 MG tablet Take 50 mg by mouth daily.     tamsulosin (FLOMAX) 0.4 MG CAPS capsule Take 0.4 mg by mouth at bedtime.     TRELEGY ELLIPTA 200-62.5-25 MCG/INH AEPB Inhale 1 puff into the lungs daily.     warfarin (COUMADIN) 1 MG tablet Take 1 tablet (1 mg total) by mouth daily. 30 tablet 0   warfarin (COUMADIN) 5 MG tablet Take 10 mg by mouth daily.     warfarin (COUMADIN) 10 MG tablet Take 1 tablet (10 mg total) by mouth daily. 30 tablet 0   No current facility-administered medications on file prior to visit.    Cardiovascular and other pertinent studies: EKG 06/29/2021: Sinus bradycardia at a rate of 50 bpm.  Normal axis.  Left atrial enlargement.  No evidence of ischemia or underlying injury pattern.  Normal QT.   Echocardiogram 06/20/2021:   1. Left ventricular ejection fraction, by estimation, is 60 to 65%. The left ventricle has normal function. The left ventricle has no regional wall motion abnormalities. There is mild left ventricular hypertrophy. Left ventricular diastolic parameters are indeterminate.   2. Right ventricular systolic function is moderately reduced. The right ventricular size is normal.   3. Left atrial size was severely dilated.   4. Right atrial size was moderately dilated.   5. The mitral valve is grossly normal. Mild mitral valve regurgitation.   6. The aortic valve is grossly normal. Aortic valve regurgitation is trivial.   Direct-current cardioversion in the ED 06/19/2021:  Successful return to normal sinus rhythm with single shock at 100 J  EKG 06/05/2021:  Atrial fibrillation with rapid ventricular response at a rate of 144  bpm.  Direct-current cardioversion in the ED 02/23/2021 Successful return to normal sinus rhythm from atrial fibrillation with RVR.  EKG 02/23/2021: Atrial fibrillation with rapid ventricular response at a rate of 121 bpm.  PCV ECHOCARDIOGRAM COMPLETE 02/13/2021 Left ventricle cavity is normal in size. Moderate concentric hypertrophy of the left ventricle. Normal global wall motion. Normal LV systolic function with visual EF 50-55%. Diastolic function not assessed due to atrial fibrillation. Left atrial cavity is mildly dilated. Trileaflet aortic valve. Moderate aortic valve leaflet calcification. Mild (Grade I) aortic regurgitation. Mild mitral valve leaflet calcification. Mildly restricted mitral valve leaflets without significant stenosis. Moderate (Grade III) mitral regurgitation. Mild to moderate tricuspid regurgitation. Estimated pulmonary artery systolic pressure 29 mmHg. Mild to moderate pulmonic regurgitation. No significant change compared to previous study in 2021.  EKG 07/12/2019: Atrial fibrillation, controlled ventricular rate 90 bpm. Nonspecific T-abnormality.   Coronary angiography 2012: LM:  Normal LAD: Ostial 40-50% stenosis, mid LAD 30% stenosis LCx: OM2 30% stenosis RCA: Mid 20% stenosis and posterior lateral branch   Recent labs: CMP Latest Ref Rng & Units 06/25/2021 06/23/2021 06/21/2021  Glucose 70 - 99 mg/dL 89 - 83  BUN 8 - 23 mg/dL 13 - 20  Creatinine 0.61 - 1.24 mg/dL 0.98 0.84 1.06  Sodium 135 - 145 mmol/L 135 - 136  Potassium 3.5 - 5.1 mmol/L 4.2 3.4(L) 3.8  Chloride 98 - 111 mmol/L 102 - 100  CO2 22 - 32 mmol/L 24 - 24  Calcium 8.9 - 10.3 mg/dL 8.3(L) - 8.6(L)  Total Protein 6.5 - 8.1 g/dL - - -  Total Bilirubin 0.3 - 1.2 mg/dL - - -  Alkaline Phos 38 - 126 U/L - - -  AST 15 - 41 U/L - - -  ALT 0 - 44 U/L - - -   CBC Latest Ref Rng & Units 06/25/2021 06/24/2021 06/23/2021  WBC 4.0 - 10.5 K/uL 6.3 8.0 10.3  Hemoglobin 13.0 - 17.0 g/dL 12.7(L) 11.5(L) 11.8(L)   Hematocrit 39.0 - 52.0 % 37.9(L) 34.6(L) 34.1(L)  Platelets 150 - 400 K/uL 151 155 126(L)   Lipid Panel     Component Value Date/Time   CHOL 83 06/21/2021 0449   TRIG 37 06/21/2021 0449   HDL 37 (L) 06/21/2021 0449   CHOLHDL 2.2 06/21/2021 0449   VLDL 7 06/21/2021 0449   LDLCALC 39 06/21/2021 0449   HEMOGLOBIN A1C Lab Results  Component Value Date   HGBA1C 5.4 09/09/2019   MPG 97 02/22/2015   TSH No results for input(s): TSH in the last 8760 hours.  02/01/2021: BUN 14, creatinine 0.87, GFR >60, sodium 142, potassium 3.8, AST 32, ALT 27 Total cholesterol 101, triglycerides 41, HDL 47, LDL 43 BNP pending  09/09/2019: HbA1C 5.4%  12/01/2018: H/H 13/38.2. MCV 97. Platelets 157   Review of Systems  Constitutional: Negative for malaise/fatigue and weight gain.  Cardiovascular:  Positive for dyspnea on exertion (improved). Negative for chest pain, claudication, leg swelling, near-syncope, orthopnea, palpitations, paroxysmal nocturnal dyspnea and syncope.  Respiratory:  Negative for shortness of breath.   Neurological:  Positive for dizziness (intermittent, improved).        Vitals:   06/29/21 1029 06/29/21 1030  BP:    Pulse:    Resp:    Temp:    SpO2: 97% 97%     Body mass index is 27.89 kg/m. Filed Weights   06/29/21 1027  Weight: 200 lb (90.7 kg)     Objective:   Physical Exam Vitals reviewed.  Constitutional:      Appearance: He is well-developed.  Neck:     Vascular: No JVD.  Cardiovascular:     Rate and Rhythm: Regular rhythm. Bradycardia present.     Pulses: Intact distal pulses.     Heart sounds: S1 normal and S2 normal. No murmur heard.   No gallop.  Pulmonary:     Effort: No respiratory distress.     Breath sounds: Normal breath sounds. No wheezing, rhonchi or rales.  Musculoskeletal:     Right lower leg: No edema.     Left lower leg: No edema.  Neurological:     Mental Status: He is alert.       Assessment & Recommendations:    66 y.o. male with hypertension, paroxysmal Afib, moderate nonobstructive coronary artery disease (cath 2012), COPD, active smoker, prior h/o polysubstance abuse (EtOH, cocaine).   Chronic HFpEF:  Echocardiogram 06/2021 reveals  normal LVEF with mild LVH, dilated LA and RA as well as mild mitral regurgitation. There is no clinical evidence of heart failure at today's visit Suspect acute on chronic heart failure contributing to patient's acute respiratory failure as he was diuresed approximately 4 L which correlated with improving respiratory status. Continue Lasix 40 mg p.o. twice daily, Imdur, metoprolol. Patient has been on spironolactone, however given her blood pressure and dizziness we will stop spironolactone   Paroxysmal atrial fibrillation: Rate control: Diltiazem, metoprolol Rhythm control: Multaq Thromboembolic prophylaxis: Coumadin -patient developed bilateral PE while on Xarelto, although suspect lack of compliance played a role in this. Anticoagulations now being managed by hematology and PCP, will defer to them CHA2DS2VAsc score 4, annual stroke risk 5% Patient continues to maintain normal sinus rhythm since cardioversion 06/19/2021 Continue Multaq, metoprolol, and diltiazem   CAD: Patient denies anginal symptoms He is not on aspirin due to ongoing use of anticoagulation with Coumadin Continue rosuvastatin, metoprolol, Imdur, and Ranexa Lipids are well controlled  Hypertension: Blood pressure is soft at today's office visit patient complaining of intermittent dizziness We will stop spironolactone Continue metoprolol, Imdur, diltiazem  Follow up in 3 months, sooner if needed.    Alethia Berthold, PA-C 06/29/2021, 12:52 PM Office: [phone]

## 2021-07-02 ENCOUNTER — Other Ambulatory Visit (HOSPITAL_COMMUNITY): Payer: Self-pay

## 2021-07-03 ENCOUNTER — Telehealth: Payer: Self-pay | Admitting: Pharmacist

## 2021-07-04 ENCOUNTER — Other Ambulatory Visit: Payer: Self-pay

## 2021-07-04 ENCOUNTER — Ambulatory Visit: Payer: Medicaid Other | Admitting: Pharmacist

## 2021-07-04 DIAGNOSIS — I480 Paroxysmal atrial fibrillation: Secondary | ICD-10-CM

## 2021-07-04 DIAGNOSIS — Z5181 Encounter for therapeutic drug level monitoring: Secondary | ICD-10-CM | POA: Insufficient documentation

## 2021-07-04 DIAGNOSIS — I2694 Multiple subsegmental pulmonary emboli without acute cor pulmonale: Secondary | ICD-10-CM

## 2021-07-04 LAB — POCT INR: INR: 7.9 — AB (ref 2.0–3.0)

## 2021-07-04 NOTE — Telephone Encounter (Signed)
Agree 

## 2021-07-04 NOTE — Telephone Encounter (Signed)
We are going to switch him to Eliquis per Dr. Rosemary Holms as this was likley due to poor compliance with Xarelto and he is high risk for being out of goal INR range given poor follow up and medication compliance.

## 2021-07-04 NOTE — Progress Notes (Signed)
Anticoagulation Management ?Kevin Orozco is a 66 y.o. male who reports to the clinic for monitoring of warfarin treatment.   ? ?Indication: atrial fibrillation and PE CHA2DS2 Vasc Score 2 (CHF hx, DM), HAS-BLED 1  ?Duration: indefinite ?Supervising physician: Vernell Leep ? ?Anticoagulation Clinic Visit History: ?Pt w/ PMH of paroxysmal Afib maintained on Xarelto previously. Pt went to the ER on 06/19/21 with concerns of significant SOB and CP. Pt was found with PE. Was assessed by Hem/Onc and was considered to have possibly failed Xeralto. Pt does have history of questionable noncompliance. Pt was referred to a pill pack program and reports he has been more compliant since.  Pt was discharged home on warfarin 11 mg daily. Pt taking 2 tabs of 5 mg warfarin + 1 tab of 1 mg warfarin since being discharged. Significant DDI between warfarin and multaq and prednisone noted. Known increased anticoagulation effect with concomitant therapy. Pt was initially planned to be followed through PCP and Hem/Onc for INR management, by pt is currently followed by a house call provider who stated that they wouldn't be able to manage his INR effectively.   ? ?Patient does not report signs/symptoms of bleeding or thromboembolism  ? ?Other recent changes: No diet, medications, lifestyle ? ?Anticoagulation Episode Summary   ? ? Current INR goal:  2.0-3.0  ?TTR:  --  ?Next INR check:  07/09/2021  ?INR from last check:  7.9 (07/04/2021)  ?Weekly max warfarin dose:    ?Target end date:    ?INR check location:    ?Preferred lab:    ?Send INR reminders to:    ? Indications   ?Paroxysmal A-fib ([place]) [I48.0] ?Multiple subsegmental pulmonary emboli without acute cor pulmonale (HCC) [I26.94] ?Monitoring for long-term anticoagulant use [Z51.81 ?Z79.01] ? ?  ?  ? ? Comments:  Bilateral segmental and subsegmental pulmonary emboli. No ?evidence of right heart strain while on Xarelto  ?  ? ?  ? ? ?Allergies  ?Allergen Reactions  ? Etomidate Anxiety   ? Tape Other (See Comments)  ?  SKIN WILL TEAR EASILY!!!!  ? ? ?Current Outpatient Medications:  ?  clonazePAM (KLONOPIN) 0.5 MG tablet, Take 0.5-1 mg by mouth See admin instructions. 05.mg bid and 1mg  every evening as needed for anxiety., Disp: , Rfl:  ?  diltiazem (CARDIZEM CD) 120 MG 24 hr capsule, Take 1 capsule (120 mg total) by mouth daily., Disp: 90 capsule, Rfl: 3 ?  dronedarone (MULTAQ) 400 MG tablet, Take 1 tablet (400 mg total) by mouth 2 (two) times daily with a meal., Disp: 60 tablet, Rfl: 2 ?  finasteride (PROSCAR) 5 MG tablet, Take 5 mg by mouth daily., Disp: , Rfl:  ?  furosemide (LASIX) 40 MG tablet, Take 1 tablet (40 mg total) by mouth 2 (two) times daily., Disp: 60 tablet, Rfl: 0 ?  gabapentin (NEURONTIN) 300 MG capsule, Take 300 mg by mouth at bedtime., Disp: , Rfl:  ?  ipratropium-albuterol (DUONEB) 0.5-2.5 (3) MG/3ML SOLN, Take 3 mLs by nebulization 4 (four) times daily as needed (for shortness of breath or wheezing)., Disp: , Rfl:  ?  isosorbide mononitrate (IMDUR) 30 MG 24 hr tablet, Take 1 tablet (30 mg total) by mouth daily., Disp: 30 tablet, Rfl: 0 ?  metoprolol (TOPROL-XL) 200 MG 24 hr tablet, Take 200 mg by mouth daily., Disp: , Rfl:  ?  morphine (MS CONTIN) 30 MG 12 hr tablet, Take 30 mg by mouth every 12 (twelve) hours., Disp: , Rfl:  ?  MOVANTIK 12.5  MG TABS tablet, Take 12.5 mg by mouth every morning., Disp: , Rfl:  ?  Naloxone HCl (KLOXXADO) 8 MG/0.1ML LIQD, Place into the nose as needed (for accidental overdose)., Disp: , Rfl:  ?  nitroGLYCERIN (NITROSTAT) 0.4 MG SL tablet, Place under the tongue every 5 (five) minutes as needed for chest pain., Disp: , Rfl:  ?  oxyCODONE-acetaminophen (PERCOCET) 10-325 MG tablet, Take 1 tablet by mouth every 4 (four) hours as needed for pain., Disp: , Rfl:  ?  pantoprazole (PROTONIX) 40 MG tablet, Take 40 mg by mouth daily before breakfast., Disp: , Rfl:  ?  predniSONE (DELTASONE) 10 MG tablet, Take 10 mg by mouth daily with breakfast.  Continuous., Disp: , Rfl:  ?  ranolazine (RANEXA) 500 MG 12 hr tablet, TAKE ONE TABLET BY MOUTH TWICE DAILY (Patient taking differently: Take 500 mg by mouth in the morning and at bedtime.), Disp: 60 tablet, Rfl: 6 ?  rosuvastatin (CRESTOR) 10 MG tablet, Take 1 tablet (10 mg total) by mouth daily., Disp: 90 tablet, Rfl: 1 ?  sertraline (ZOLOFT) 50 MG tablet, Take 50 mg by mouth daily., Disp: , Rfl:  ?  tamsulosin (FLOMAX) 0.4 MG CAPS capsule, Take 0.4 mg by mouth at bedtime., Disp: , Rfl:  ?  TRELEGY ELLIPTA 200-62.5-25 MCG/INH AEPB, Inhale 1 puff into the lungs daily., Disp: , Rfl:  ?  warfarin (COUMADIN) 1 MG tablet, Take 1 tablet (1 mg total) by mouth daily., Disp: 30 tablet, Rfl: 0 ?  warfarin (COUMADIN) 10 MG tablet, Take 1 tablet (10 mg total) by mouth daily., Disp: 30 tablet, Rfl: 0 ?  warfarin (COUMADIN) 5 MG tablet, Take 10 mg by mouth daily., Disp: , Rfl:  ?Past Medical History:  ?Diagnosis Date  ? Acute respiratory distress 02/22/2015  ? Atrial fibrillation with RVR ([place]) 04/19/12  ? New onset; spontaneous conversion to NSR; Pradaxa anticoagulation  ? CAD (coronary artery disease)   ? a. Cath 2008- nonobstructive, mod LAD, mild LCx & RCA disease b. Cardiolite 2010 normal c. 03/2012 abnormal Lexiscan Myoview attributed to cocaine d. 03/2012 echo: EF 55-60%, moderate LVH, moderate LA dilatation  ? Cocaine abuse ([place])   ? DM type 2 (diabetes mellitus, type 2) ([place])   ? ETOH abuse   ? 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 Elevated LFTs in the setting of acute EtOH abuse, no acute findings on abdominal u/s, HCV+, suspected acute alcohilic heptatitis  ? HTN (hypertension)   ? Hypercholesteremia   ? PAF (paroxysmal atrial fibrillation) ([employer] [employer]) 02/22/2015  ? Tobacco abuse   ? ? ?ASSESSMENT ? ?Recent Results: ?The most recent result is correlated with 77 mg per week: ? ?Lab Results  ?Component Value Date  ? INR 7.9 (A) 07/04/2021  ? INR 2.1 (H) 06/26/2021  ? INR 1.8 (H) 06/25/2021  ? ? ?Anticoagulation  Dosing: ?Description   ?INR above goal. HOLD warfarin for 5 days. Recheck INR in 5 days. Start Eliquis 5 mg BID if INR <2.  ?  ?  ? ?INR today: Supratherapeutic. INR significantly elevated in setting of new start warfarin without close INR monitoring. Significant DDI noted between warfarin and Multaq and warfarin and prednisone. Pt denies any significant complains of bleeding or bruising symptoms. Denies any concerns of epistaxis, hematuria, hematochezia, melena. Pt notes that he has noticed significant bruising in his arms, but attest to it being due to working on his car and accidentally scratching himself. Pt reports that bleeding self-resolve with bandages. Reviewed with Celeste and pt being  considered to transition to Eliquis. Pt with concerns being able to stay compliant to his warfarin requirement of staying compliant to diet, checking his INR regularly at the office, and adjusted his warfarin dose as directed. In setting of significantly elevated INR today, will transition to Eliquis once INR <2. Pt aware to monitor for signs and symptoms of significant bleedings or bruising complains and going to the ER.  ? ?PLAN ?Weekly dose was deferred at this time by  0 % to 0 mg per week ? ?Patient Instructions  ?INR above goal. HOLD warfarin for 5 days. Recheck INR in 5 days. Start Eliquis 5 mg BID if INR <2.  ?Patient advised to contact clinic or seek medical attention if signs/symptoms of bleeding or thromboembolism occur. ? ?Patient verbalized understanding by repeating back information and was advised to contact me if further medication-related questions arise.  ? ?Follow-up ?Return in about 5 days (around 07/09/2021). ? ?Alysia Penna, PharmD ? ?15 minutes spent face-to-face with the patient during the encounter. 50% of time spent on education, including signs/sx bleeding and clotting, as well as food and drug interactions with warfarin. 50% of time was spent on fingerprick POC INR sample collection,processing,  results determination, and documentation ? ?

## 2021-07-06 NOTE — Patient Instructions (Signed)
INR above goal. HOLD warfarin for 5 days. Recheck INR in 5 days. Start Eliquis 5 mg BID if INR <2.  ?

## 2021-07-09 ENCOUNTER — Ambulatory Visit: Payer: Medicaid Other | Admitting: Pharmacist

## 2021-07-09 ENCOUNTER — Other Ambulatory Visit: Payer: Self-pay | Admitting: Pharmacist

## 2021-07-09 ENCOUNTER — Other Ambulatory Visit: Payer: Self-pay

## 2021-07-09 ENCOUNTER — Other Ambulatory Visit: Payer: Self-pay | Admitting: Obstetrics and Gynecology

## 2021-07-09 DIAGNOSIS — Z5181 Encounter for therapeutic drug level monitoring: Secondary | ICD-10-CM

## 2021-07-09 DIAGNOSIS — I480 Paroxysmal atrial fibrillation: Secondary | ICD-10-CM

## 2021-07-09 DIAGNOSIS — I2694 Multiple subsegmental pulmonary emboli without acute cor pulmonale: Secondary | ICD-10-CM

## 2021-07-09 LAB — POCT INR: INR: 1.1 — AB (ref 2.0–3.0)

## 2021-07-09 NOTE — Patient Instructions (Signed)
INR <2. Start Eliquis 5 mg BID ?

## 2021-07-09 NOTE — Patient Outreach (Signed)
Care Coordination ? ?07/09/2021 ? ?Kevin Orozco ?1955/11/09 ?[id number] ? ? ?Medicaid Managed Care  ? ?Unsuccessful Outreach Note ? ?07/09/2021 ?Name: Kevin Orozco MRN: [id number] DOB: Jul 20, 1955 ? ?Referred by: Marletta Lor, NP ?Reason for referral : High Risk Managed Medicaid (Chronic case management-INITIAL-RN) ? ? ?An unsuccessful telephone outreach was attempted today. The patient was referred to the case management team for assistance with care management and care coordination.  ? ?Follow Up Plan: The care management team will reach out to the patient again over the next 7-14 business days.  ? ?Kathi Der RN, BSN ?[place]  [facility] ?Care Management Coordinator - Managed Medicaid High Risk ?[phone] ?  ? ? ?

## 2021-07-09 NOTE — Patient Instructions (Signed)
Hi Mr. Sainvil, I am sorry we missed you today - as a part of your Medicaid benefit, you are eligible for care management and care coordination services at no cost or copay. I was unable to reach you by phone today but would be happy to help you with your health related needs. Please feel free to call me at [phone] ? ?A member of the Managed Medicaid care management team will reach out to you again over the next 7 - 14 business days.  ? ?Kathi Der RN, BSN ?[place]  [facility] ?Care Management Coordinator - Managed Medicaid High Risk ?[phone] ?  ?

## 2021-07-09 NOTE — Progress Notes (Signed)
Anticoagulation Management ?Kevin Orozco is a 66 y.o. male who reports to the clinic for monitoring of warfarin treatment.   ? ?Indication: atrial fibrillation and PE CHA2DS2 Vasc Score 2 (CHF hx, DM), HAS-BLED 1  ?Duration: indefinite ?Supervising physician: Vernell Leep ? ?Anticoagulation Clinic Visit History: ?Pt w/ PMH of paroxysmal Afib maintained on Xarelto previously. Pt went to the ER on 06/19/21 with concerns of significant SOB and CP. Pt was found with PE. Was assessed by Hem/Onc and was considered to have possibly failed Xeralto. Pt does have history of questionable noncompliance. Pt was referred to a pill pack program and reports he has been more compliant since.  Pt was discharged home on warfarin 11 mg daily. Pt taking 2 tabs of 5 mg warfarin + 1 tab of 1 mg warfarin since being discharged. Significant DDI between warfarin and multaq and prednisone noted. Known increased anticoagulation effect with concomitant therapy. Pt was initially planned to be followed through PCP and Hem/Onc for INR management, by pt is currently followed by a house call provider who stated that they wouldn't be able to manage his INR effectively.   ? ?Patient does not report signs/symptoms of bleeding or thromboembolism  ? ?Other recent changes: No diet, medications, lifestyle ? ?Pt reports to have held his warfarin as directed. 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 Denies any complains of nosebleeds, gum bleeds, blood in stool/urine, coughing up blood.  ? ?Allergies  ?Allergen Reactions  ? Etomidate Anxiety  ? Tape Other (See Comments)  ?  SKIN WILL TEAR EASILY!!!!  ? ? ?Current Outpatient Medications:  ?  clonazePAM (KLONOPIN) 0.5 MG tablet, Take 0.5-1 mg by mouth See admin instructions. 05.mg bid and 1mg  every evening as needed for anxiety., Disp: , Rfl:  ?  diltiazem (CARDIZEM CD) 120 MG 24 hr capsule, Take 1 capsule (120 mg total) by mouth daily., Disp: 90  capsule, Rfl: 3 ?  dronedarone (MULTAQ) 400 MG tablet, Take 1 tablet (400 mg total) by mouth 2 (two) times daily with a meal., Disp: 60 tablet, Rfl: 2 ?  finasteride (PROSCAR) 5 MG tablet, Take 5 mg by mouth daily., Disp: , Rfl:  ?  furosemide (LASIX) 40 MG tablet, Take 1 tablet (40 mg total) by mouth 2 (two) times daily., Disp: 60 tablet, Rfl: 0 ?  gabapentin (NEURONTIN) 300 MG capsule, Take 300 mg by mouth at bedtime., Disp: , Rfl:  ?  ipratropium-albuterol (DUONEB) 0.5-2.5 (3) MG/3ML SOLN, Take 3 mLs by nebulization 4 (four) times daily as needed (for shortness of breath or wheezing)., Disp: , Rfl:  ?  isosorbide mononitrate (IMDUR) 30 MG 24 hr tablet, Take 1 tablet (30 mg total) by mouth daily., Disp: 30 tablet, Rfl: 0 ?  metoprolol (TOPROL-XL) 200 MG 24 hr tablet, Take 200 mg by mouth daily., Disp: , Rfl:  ?  morphine (MS CONTIN) 30 MG 12 hr tablet, Take 30 mg by mouth every 12 (twelve) hours., Disp: , Rfl:  ?  MOVANTIK 12.5 MG TABS tablet, Take 12.5 mg by mouth every morning., Disp: , Rfl:  ?  Naloxone HCl (KLOXXADO) 8 MG/0.1ML LIQD, Place into the nose as needed (for accidental overdose)., Disp: , Rfl:  ?  nitroGLYCERIN (NITROSTAT) 0.4 MG SL tablet, Place under the tongue every 5 (five) minutes as needed for chest pain., Disp: , Rfl:  ?  oxyCODONE-acetaminophen (PERCOCET) 10-325 MG tablet, Take 1 tablet by mouth every 4 (four) hours as needed for  pain., Disp: , Rfl:  ?  pantoprazole (PROTONIX) 40 MG tablet, Take 40 mg by mouth daily before breakfast., Disp: , Rfl:  ?  predniSONE (DELTASONE) 10 MG tablet, Take 10 mg by mouth daily with breakfast. Continuous., Disp: , Rfl:  ?  ranolazine (RANEXA) 500 MG 12 hr tablet, TAKE ONE TABLET BY MOUTH TWICE DAILY (Patient taking differently: Take 500 mg by mouth in the morning and at bedtime.), Disp: 60 tablet, Rfl: 6 ?  rosuvastatin (CRESTOR) 10 MG tablet, Take 1 tablet (10 mg total) by mouth daily., Disp: 90 tablet, Rfl: 1 ?  sertraline (ZOLOFT) 50 MG tablet, Take 50  mg by mouth daily., Disp: , Rfl:  ?  tamsulosin (FLOMAX) 0.4 MG CAPS capsule, Take 0.4 mg by mouth at bedtime., Disp: , Rfl:  ?  TRELEGY ELLIPTA 200-62.5-25 MCG/INH AEPB, Inhale 1 puff into the lungs daily., Disp: , Rfl:  ?Past Medical History:  ?Diagnosis Date  ? Acute respiratory distress 02/22/2015  ? Atrial fibrillation with RVR ([place]) 04/19/12  ? New onset; spontaneous conversion to NSR; Pradaxa anticoagulation  ? CAD (coronary artery disease)   ? a. Cath 2008- nonobstructive, mod LAD, mild LCx & RCA disease b. Cardiolite 2010 normal c. 03/2012 abnormal Lexiscan Myoview attributed to cocaine d. 03/2012 echo: EF 55-60%, moderate LVH, moderate LA dilatation  ? Cocaine abuse ([place])   ? DM type 2 (diabetes mellitus, type 2) ([place])   ? ETOH abuse   ? 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 Elevated LFTs in the setting of acute EtOH abuse, no acute findings on abdominal u/s, HCV+, suspected acute alcohilic heptatitis  ? HTN (hypertension)   ? Hypercholesteremia   ? PAF (paroxysmal atrial fibrillation) ([place]) 02/22/2015  ? Tobacco abuse   ? ? ?ASSESSMENT ? ?Recent Results: ?The most recent result is correlated with 22 mg per week: ? ?Lab Results  ?Component Value Date  ? INR 1.1 (A) 07/09/2021  ? INR 7.9 (A) 07/04/2021  ? INR 2.1 (H) 06/26/2021  ? ? ?Anticoagulation Dosing: ? ?INR today: Subtherapeutic. Following holding warfarin for 5 days. INR <2 and thus will have pt start Eliquis today. Provided pt with samples to get started on while waiting for PA to be completed on pt's behalf. Denies any complains of bleeding or bruising symptoms. Denies any other relevant changes in her diet, medications, or lifestyle.  ? ?PLAN ?Weekly dose was deferred at this time by  0 % to 0 mg per week ? ?Patient Instructions  ?INR <2. Start Eliquis 5 mg BID ?Patient advised to contact clinic or seek medical attention if signs/symptoms of bleeding or thromboembolism occur. ? ?Patient verbalized understanding by repeating back information and was  advised to contact me if further medication-related questions arise.  ? ?Follow-up ?No follow-ups on file. ? ?Alysia Penna, PharmD ? ?15 minutes spent face-to-face with the patient during the encounter. 50% of time spent on education, including signs/sx bleeding and clotting, as well as food and drug interactions with warfarin. 50% of time was spent on fingerprick POC INR sample collection,processing, results determination, and documentation ? ?

## 2021-07-10 MED ORDER — APIXABAN 5 MG PO TABS: 5.0000 mg | ORAL_TABLET | Freq: Two times a day (BID) | ORAL | 5 refills | Status: DC

## 2021-07-10 NOTE — Progress Notes (Signed)
Eliquis order has been sent. Please let me know if you need anything additionally from me.

## 2021-07-12 ENCOUNTER — Ambulatory Visit: Payer: Medicaid Other | Admitting: Student

## 2021-07-19 ENCOUNTER — Inpatient Hospital Stay: Payer: Medicaid Other

## 2021-07-19 ENCOUNTER — Inpatient Hospital Stay: Payer: Medicaid Other | Attending: Hematology and Oncology | Admitting: Hematology and Oncology

## 2021-07-19 NOTE — Progress Notes (Deleted)
[place] ?CONSULT NOTE ? ?Patient Care Team: ?[name], NP as PCP - General (Nurse Practitioner) ? ?CHIEF COMPLAINTS/PURPOSE OF CONSULTATION:  ?PE ? ?ASSESSMENT & PLAN:  ?No problem-specific Assessment & Plan notes found for this encounter. ? ?No orders of the defined types were placed in this encounter. ? ? ? ?HISTORY OF PRESENTING ILLNESS:  ? ?Kevin Orozco 66 y.o. male is here because of PE. ? ?This is a 66 year old male patient with hypertension, paroxysmal atrial fibrillation, COPD, active smoker, admitted with acute on chronic hypoxic respiratory failure, A-fib with RVR.  He was attempted cardioversion in emergency room however continued to have shortness of breath and was admitted for further investigation.  He had CT imaging which showed bilateral subsegmental PE without RV strain.  At the home, he is on anticoagulation with Xarelto and he denies any noncompliance. ?Hematology was consulted because of concern for anticoagulation failure. ?He says he has been doing everything like it was asked, has not used alcohol or cocaine in years. ?No evidence of underlying malignancy ?He was found to have bilateral lower lobe infiltrates suggestive of atelectasis versus pneumonia. ? ?I saw him during his inpatient visit and recommended outpatient follow-up for consideration of hypercoagulable work-up.  Thank you cookie monster at home ? ?REVIEW OF SYSTEMS:   ?Constitutional: Denies fevers, chills or abnormal night sweats ?Eyes: Denies blurriness of vision, double vision or watery eyes ?Ears, nose, mouth, throat, and face: Denies mucositis or sore throat ?Respiratory: Denies cough, dyspnea or wheezes ?Cardiovascular: Denies palpitation, chest discomfort or lower extremity swelling ?Gastrointestinal:  Denies nausea, heartburn or change in bowel habits ?Skin: Denies abnormal skin rashes ?Lymphatics: Denies new lymphadenopathy or easy bruising ?Neurological:Denies numbness, tingling or new  weaknesses ?Behavioral/Psych: Mood is stable, no new changes  ?All other systems were reviewed with the patient and are negative. ? ?MEDICAL HISTORY:  ?Past Medical History:  ?Diagnosis Date  ? Acute respiratory distress 02/22/2015  ? Atrial fibrillation with RVR ([place]) 04/19/12  ? New onset; spontaneous conversion to NSR; Pradaxa anticoagulation  ? CAD (coronary artery disease)   ? a. Cath 2008- nonobstructive, mod LAD, mild LCx & RCA disease b. Cardiolite 2010 normal c. 03/2012 abnormal Lexiscan Myoview attributed to cocaine d. 03/2012 echo: EF 55-60%, moderate LVH, moderate LA dilatation  ? Cocaine abuse ([place])   ? DM type 2 (diabetes mellitus, type 2) ([place])   ? ETOH abuse   ? 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 Elevated LFTs in the setting of acute EtOH abuse, no acute findings on abdominal u/s, HCV+, suspected acute alcohilic heptatitis  ? HTN (hypertension)   ? Hypercholesteremia   ? PAF (paroxysmal atrial fibrillation) ([place]) 02/22/2015  ? Tobacco abuse   ? ? ?SURGICAL HISTORY: ?Past Surgical History:  ?Procedure Laterality Date  ? APPENDECTOMY    ? CARDIOVERSION N/A 02/21/2020  ? Procedure: CARDIOVERSION;  Surgeon: Adrian Prows, MD;  Location: [facility] ENDOSCOPY;  Service: Cardiovascular;  Laterality: N/A;  ? FOOT SURGERY    ? ? ?SOCIAL HISTORY: ?Social History  ? ?Socioeconomic History  ? Marital status: Divorced  ?  Spouse name: Not on file  ? Number of children: 1  ? Years of education: Not on file  ? Highest education level: Not on file  ?Occupational History  ? Occupation: unemployed  ?Tobacco Use  ? Smoking status: Every Day  ?  Packs/day: 1.00  ?  Years: 40.00  ?  Pack years: 40.00  ?  Types: Cigarettes  ? Smokeless tobacco: Never  ?  Vaping Use  ? Vaping Use: Never used  ?Substance and Sexual Activity  ? Alcohol use: Not Currently  ?  Alcohol/week: 12.0 standard drinks  ?  Types: 12 Standard drinks or equivalent per week  ? Drug use: Yes  ?  Types: Cocaine, Morphine, Oxycodone  ?  Comment: Currently on Hydrocodone and  Morphine, not Cocaine  ? Sexual activity: Not Currently  ?Other Topics Concern  ? Not on file  ?Social History Narrative  ? Unemployed.  Lives alone. Divorced.  ? ?Social Determinants of Health  ? ?Financial Resource Strain: Not on file  ?Food Insecurity: Not on file  ?Transportation Needs: Not on file  ?Physical Activity: Not on file  ?Stress: Not on file  ?Social Connections: Not on file  ?Intimate Partner Violence: Not on file  ? ? ?FAMILY HISTORY: ?Family History  ?Problem Relation Age of Onset  ? Heart attack Father 54  ? Diabetes Mother 37  ? Diabetes Sister   ? Heart attack Sister   ? ? ?ALLERGIES:  is allergic to etomidate and tape. ? ?MEDICATIONS:  ?Current Outpatient Medications  ?Medication Sig Dispense Refill  ? apixaban (ELIQUIS) 5 MG TABS tablet Take 1 tablet (5 mg total) by mouth 2 (two) times daily. 60 tablet 5  ? clonazePAM (KLONOPIN) 0.5 MG tablet Take 0.5-1 mg by mouth See admin instructions. 05.mg bid and 1mg  every evening as needed for anxiety.    ? diltiazem (CARDIZEM CD) 120 MG 24 hr capsule Take 1 capsule (120 mg total) by mouth daily. 90 capsule 3  ? dronedarone (MULTAQ) 400 MG tablet Take 1 tablet (400 mg total) by mouth 2 (two) times daily with a meal. 60 tablet 2  ? finasteride (PROSCAR) 5 MG tablet Take 5 mg by mouth daily.    ? furosemide (LASIX) 40 MG tablet Take 1 tablet (40 mg total) by mouth 2 (two) times daily. 60 tablet 0  ? gabapentin (NEURONTIN) 300 MG capsule Take 300 mg by mouth at bedtime.    ? ipratropium-albuterol (DUONEB) 0.5-2.5 (3) MG/3ML SOLN Take 3 mLs by nebulization 4 (four) times daily as needed (for shortness of breath or wheezing).    ? isosorbide mononitrate (IMDUR) 30 MG 24 hr tablet Take 1 tablet (30 mg total) by mouth daily. 30 tablet 0  ? metoprolol (TOPROL-XL) 200 MG 24 hr tablet Take 200 mg by mouth daily.    ? morphine (MS CONTIN) 30 MG 12 hr tablet Take 30 mg by mouth every 12 (twelve) hours.    ? MOVANTIK 12.5 MG TABS tablet Take 12.5 mg by mouth every  morning.    ? Naloxone HCl (KLOXXADO) 8 MG/0.1ML LIQD Place into the nose as needed (for accidental overdose).    ? nitroGLYCERIN (NITROSTAT) 0.4 MG SL tablet Place under the tongue every 5 (five) minutes as needed for chest pain.    ? oxyCODONE-acetaminophen (PERCOCET) 10-325 MG tablet Take 1 tablet by mouth every 4 (four) hours as needed for pain.    ? pantoprazole (PROTONIX) 40 MG tablet Take 40 mg by mouth daily before breakfast.    ? predniSONE (DELTASONE) 10 MG tablet Take 10 mg by mouth daily with breakfast. Continuous.    ? ranolazine (RANEXA) 500 MG 12 hr tablet TAKE ONE TABLET BY MOUTH TWICE DAILY (Patient taking differently: Take 500 mg by mouth in the morning and at bedtime.) 60 tablet 6  ? rosuvastatin (CRESTOR) 10 MG tablet Take 1 tablet (10 mg total) by mouth daily. 90 tablet 1  ?  sertraline (ZOLOFT) 50 MG tablet Take 50 mg by mouth daily.    ? tamsulosin (FLOMAX) 0.4 MG CAPS capsule Take 0.4 mg by mouth at bedtime.    ? TRELEGY ELLIPTA 200-62.5-25 MCG/INH AEPB Inhale 1 puff into the lungs daily.    ? ?No current facility-administered medications for this visit.  ? ? ? ?PHYSICAL EXAMINATION: ?ECOG PERFORMANCE STATUS: {CHL ONC ECOG [id number] ? ?There were no vitals filed for this visit. ?There were no vitals filed for this visit. ? ?GENERAL:alert, no distress and comfortable ?SKIN: skin color, texture, turgor are normal, no rashes or significant lesions ?EYES: normal, conjunctiva are pink and non-injected, sclera clear ?OROPHARYNX:no exudate, no erythema and lips, buccal mucosa, and tongue normal  ?NECK: supple, thyroid normal size, non-tender, without nodularity ?LYMPH:  no palpable lymphadenopathy in the cervical, axillary or inguinal ?LUNGS: clear to auscultation and percussion with normal breathing effort ?HEART: regular rate & rhythm and no murmurs and no lower extremity edema ?ABDOMEN:abdomen soft, non-tender and normal bowel sounds ?Musculoskeletal:no cyanosis of digits and no clubbing   ?PSYCH: alert & oriented x 3 with fluent speech ?NEURO: no focal motor/sensory deficits ? ?LABORATORY DATA:  ?I have reviewed the data as listed ?Lab Results  ?Component Value Date  ? WBC 6.3 06/25/2021  ? HGB 12.7 (

## 2021-07-23 ENCOUNTER — Telehealth: Payer: Self-pay

## 2021-07-23 ENCOUNTER — Other Ambulatory Visit: Payer: Self-pay | Admitting: Obstetrics and Gynecology

## 2021-07-23 ENCOUNTER — Other Ambulatory Visit: Payer: Self-pay | Admitting: Student

## 2021-07-23 NOTE — Telephone Encounter (Signed)
Pt was informed about metoprolol to 100mg  daily

## 2021-07-23 NOTE — Patient Instructions (Signed)
Hi Mr. Morgante, thanks for speaking with me this afternoon-have a nice afternoon ? ?Mr. Sacha was given information about Medicaid Managed Care team care coordination services as a part of their [facility] Medicaid benefit. Suheb Bermudes verbally consented to engagement with the [facility] Managed Care team.  ? ?If you are experiencing a medical emergency, please call 911 or report to your local emergency department or urgent care.  ? ?If you have a non-emergency medical problem during routine business hours, please contact your provider's office and ask to speak with a nurse.  ? ?For questions related to your [facility] health plan, please call: [phone] or go here:[email or web address] ? ?If you would like to schedule transportation through your [facility] plan, please call the following number at least 2 days in advance of your appointment: [phone]. ? You can also use the MTM portal or MTM mobile app to manage your rides. For the portal, please go to [email or web address]. ? ?Call the [place] at [phone], at any time, 24 hours a day, 7 days a week. If you are in danger or need immediate medical attention call 911. ? ?If you would like help to quit smoking, call 1-800-QUIT-NOW (315)648-1783) OR Espa?ol: 1-855-D?jelo-Ya 732-869-2584) o para m?s informaci?n haga clic aqu? or Text READY to 200-400 to register via text ? ?Mr. Mcnelly - following are the goals we discussed in your visit today:  ? Goals Addressed   ? ?Long-Range Goal: Establish Plan of Care for Chronic Disease Management Needs   ?Start Date: 07/23/2021  ?Expected End Date: 10/22/2021  ?Priority: High  ?Note:   ?Timeframe:  Long-Range Goal ?Priority:  High ?Start Date:      07/23/21                       ?Expected End Date:  ongoing                    ? ?Follow Up Date 08/22/21  ?  ?- schedule appointment for flu shot ?- schedule appointment for vaccines needed due to my age or health ?- schedule recommended  health tests ?- schedule and keep appointment for annual check-up  ?  ?Why is this important?   ?Screening tests can find diseases early when they are easier to treat.  ?Your doctor or nurse will talk with you about which tests are important for you.  ?Getting shots for common diseases like the flu and shingles will help prevent them.   ? ? ?Patient verbalizes understanding of instructions and care plan provided today and agrees to view in [place]. Active MyChart status confirmed with patient.   ? ?The Managed Medicaid care management team will reach out to the patient again over the next 30 days.  ?The  Patient has been provided with contact information for the Managed Medicaid care management team and has been advised to call with any health related questions or concerns.  ? ?Aida Raider RN, BSN ?[facility] ?Care Management Coordinator - Managed Medicaid High Risk ?[phone] ?  ?Following is a copy of your plan of care:  ?Care Plan : [place] of Care  ?Updates made by Gayla Medicus, RN since 07/23/2021 12:00 AM  ?  ? ?Problem: Health Promotion or Disease Self-Management (General Plan of Care)   ?  ? ?Long-Range Goal: Chronic Disease Managment   ?Start Date: 07/23/2021  ?Expected End Date: 10/22/2021  ?This Visit's Progress: Not on track  ?  Priority: High  ?Note:   ?Current Barriers:  ?Chronic Disease Management support and education needs related to Tobacco Use and chronic pain  ? ?RNCM Clinical Goal(s):  ?Patient will verbalize understanding of plan for management of Tobacco Use and chronic pain  as evidenced by patient report ?take all medications exactly as prescribed and will call provider for medication related questions as evidenced by patient report ?demonstrate understanding of rationale for each prescribed medication as evidenced by patient report ?attend all scheduled medical appointments as evidenced by patient report ?continue to work with RN Care Manager to address  care management and care coordination needs related to  Tobacco Use and chronic pain  as evidenced by adherence to CM Team Scheduled appointments through collaboration with RN Care manager, provider, and care team.  ? ?Interventions: ?Inter-disciplinary care team collaboration (see longitudinal plan of care) ?Evaluation of current treatment plan related to  self management and patient's adherence to plan as established by provider ?Pain Interventions:  (Status:  New goal.) Long Term Goal ?Pain assessment performed ?Medications reviewed ?Reviewed provider established plan for pain management ?Discussed importance of adherence to all scheduled medical appointments ?Counseled on the importance of reporting any/all new or changed pain symptoms or management strategies to pain management provider ?Advised patient to report to care team affect of pain on daily activities ?Assessed social determinant of health barriers ?Patient given 1-800 QUITNOW number  ? ?Patient Goals/Self-Care Activities: ?Take all medications as prescribed ?Attend all scheduled provider appointments ?Call pharmacy for medication refills 3-7 days in advance of running out of medications ?Perform all self care activities independently  ?Perform IADL's (shopping, preparing meals, housekeeping, managing finances) independently ?Call provider office for new concerns or questions  ? ?Follow Up Plan:  The patient has been provided with contact information for the care management team and has been advised to call with any health related questions or concerns.  ?The care management team will reach out to the patient again over the next 30 days.  ? ?  ?

## 2021-07-23 NOTE — Telephone Encounter (Signed)
Can reduce metoprolol to 100 mg daily.

## 2021-07-23 NOTE — Telephone Encounter (Signed)
Called pt, no answer. Left vm requesting call back?

## 2021-07-23 NOTE — Patient Outreach (Signed)
Medicaid Managed Care   Nurse Care Manager Note  07/23/2021 Name:  Kevin Orozco MRN:  [id number] DOB:  04-Feb-1956  Kevin Orozco is an 66 y.o. year old male who is a primary patient of Kevin Lor, NP.  The [facility] Managed Care Coordination team was consulted for assistance with:    Chronic healthcare management needs, tobacco use, chronic pain, CAD, HTN, Afib, CHF, COPD, HLD, Hepatitis C, anxiety  Kevin Orozco was given information about Medicaid Managed Care Coordination team services today. Kevin Orozco Patient agreed to services and verbal consent obtained.  Engaged with patient by telephone for initial visit in response to provider referral for case management and/or care coordination services.   Assessments/Interventions:  Review of past medical history, allergies, medications, health status, including review of consultants reports, laboratory and other test data, was performed as part of comprehensive evaluation and provision of chronic care management services.  SDOH (Social Determinants of Health) assessments and interventions performed: SDOH Interventions    Flowsheet Row Most Recent Value  SDOH Interventions   Intimate Partner Violence Interventions Intervention Not Indicated  Social Connections Interventions Intervention Not Indicated     Care Plan  Allergies  Allergen Reactions   Etomidate Anxiety   Tape Other (See Comments)    SKIN WILL TEAR EASILY!!!!   Medications Reviewed Today     Reviewed by Kevin Chandler, RN (Registered Nurse) on 07/23/21 at 1339  Med List Status: <None>   Medication Order Taking? Sig Documenting Provider Last Dose Status Informant  apixaban (ELIQUIS) 5 MG TABS tablet [id number]  Take 1 tablet (5 mg total) by mouth 2 (two) times daily. Cantwell, Celeste C, PA-C  Active   clonazePAM (KLONOPIN) 0.5 MG tablet [id number] No Take 0.5-1 mg by mouth See admin instructions. 05.mg bid and 1mg  every evening as needed for anxiety. [provider] Taking Active Pharmacy Records           Med Note (DODD, AMBER L   Fri Jun 29, 2021 10:21 AM)    diltiazem (CARDIZEM CD) 120 MG 24 hr capsule [id number] No Take 1 capsule (120 mg total) by mouth daily. Rayford Halsted, PA-C Taking Active Pharmacy Records           Med Note (DODD, AMBER [name]   Fri Jun 29, 2021 10:24 AM)    dronedarone (MULTAQ) 400 MG tablet [id number] No Take 1 tablet (400 mg total) by mouth 2 (two) times daily with a meal. [name], [name], MD Taking Active   finasteride (PROSCAR) 5 MG tablet [id number] No Take 5 mg by mouth daily. [provider] Taking Active   furosemide (LASIX) 40 MG tablet [id number] No Take 1 tablet (40 mg total) by mouth 2 (two) times daily. Lynn Ito, MD Taking Active   gabapentin (NEURONTIN) 300 MG capsule [id number] No Take 300 mg by mouth at bedtime. [provider] Taking Active Pharmacy Records           Med Note (DODD, AMBER [name]   Fri Jun 29, 2021 10:25 AM)    ipratropium-albuterol (DUONEB) 0.5-2.5 (3) MG/3ML SOLN [id number] No Take 3 mLs by nebulization 4 (four) times daily as needed (for shortness of breath or wheezing). [provider] Taking Active Pharmacy Records           Med Note (DODD, AMBER [name]   Fri Jun 29, 2021 10:25 AM)    isosorbide mononitrate (IMDUR) 30 MG 24 hr tablet [id number] No Take 1 tablet (30 mg total) by mouth daily.  Lynn Ito, MD Taking Active   metoprolol (TOPROL-XL) 200 MG 24 hr tablet [id number] No Take 100 mg by mouth daily. [provider] Taking Active   morphine (MS CONTIN) 30 MG 12 hr tablet [id number] No Take 30 mg by mouth every 12 (twelve) hours. [provider] Taking Active Pharmacy Records           Med Note (DODD, AMBER L   Fri Jun 29, 2021 10:30 AM)    MOVANTIK 12.5 MG TABS tablet [id number] No Take 12.5 mg by mouth every morning. [provider] Taking Active Pharmacy Records           Med Note (DODD, AMBER L   Fri Jun 29, 2021 10:24 AM)     Naloxone HCl (KLOXXADO) 8 MG/0.1ML LIQD [id number] No Place into the nose as needed (for accidental overdose). [provider] Taking Active Pharmacy Records           Med Note (DODD, AMBER L   Fri Jun 29, 2021 10:30 AM)    nitroGLYCERIN (NITROSTAT) 0.4 MG SL tablet [id number] No Place under the tongue every 5 (five) minutes as needed for chest pain. [provider] Taking Active Pharmacy Records  oxyCODONE-acetaminophen (PERCOCET) 10-325 MG tablet [id number] No Take 1 tablet by mouth every 4 (four) hours as needed for pain. [provider] Taking Active Pharmacy Records           Med Note (DODD, AMBER [name]   Fri Jun 29, 2021 10:30 AM)    pantoprazole (PROTONIX) 40 MG tablet [id number] No Take 40 mg by mouth daily before breakfast. [provider] Taking Active Pharmacy Records           Med Note (DODD, AMBER L   Fri Jun 29, 2021 10:22 AM)    predniSONE (DELTASONE) 10 MG tablet [id number] No Take 10 mg by mouth daily with breakfast. Continuous. [provider] Taking Active Pharmacy Records           Med Note (DODD, AMBER [name]   Fri Jun 29, 2021 10:24 AM)    ranolazine (RANEXA) 500 MG 12 hr tablet [id number] No TAKE ONE TABLET BY MOUTH TWICE DAILY  Patient taking differently: Take 500 mg by mouth in the morning and at bedtime.   Rayford Halsted, PA-C Taking Active Pharmacy Records           Med Note (DODD, AMBER [name]   Fri Jun 29, 2021 10:22 AM)    rosuvastatin (CRESTOR) 10 MG tablet [id number] No Take 1 tablet (10 mg total) by mouth daily. Elder Negus, MD Taking Expired 06/29/21 2359 Pharmacy Records           Med Note (DODD, AMBER L   Fri Jun 29, 2021 10:22 AM)    sertraline (ZOLOFT) 50 MG tablet [id number] No Take 50 mg by mouth daily. [provider] Taking Active Pharmacy Records           Med Note Reyne Dumas, COURTNEY A   Thu Jun 21, 2021  1:44 PM) 04/30/21 #90 x 90  tamsulosin (FLOMAX) 0.4 MG CAPS capsule [id number] No Take 0.4 mg by  mouth at bedtime. [provider] Taking Active   TRELEGY ELLIPTA 200-62.5-25 MCG/INH AEPB [id number] No Inhale 1 puff into the lungs daily. [provider] Taking Active Pharmacy Records           Med Note (DODD, AMBER [name]   Fri Jun 29, 2021 10:21 AM)    Med List Note Allyne Gee,  Kristen Loader, CPhT 01/18/14 2041): PCP Tomi Bamberger           Patient Active Problem List   Diagnosis Date Noted   Anxiety 06/20/2021   Acute CHF (congestive heart failure) (HCC) 02/18/2020   Hypokalemia 02/18/2020   Atrial fibrillation with RVR (HCC) 02/18/2020   Acute on chronic systolic CHF (congestive heart failure) (HCC) 02/18/2020   Chest pain 07/09/2019   Claudication (HCC) 09/07/2015   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HCC)    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)    Acute on chronic heart failure with preserved ejection fraction (HCC)    Tobacco abuse    Polysubstance abuse (HCC)    Marijuana abuse    Chronic obstructive pulmonary disease (HCC)    Acute respiratory failure with hypoxia and hypercapnia (HCC)    Hepatitis, viral    Acute respiratory failure with hypoxia (HCC) 02/22/2015   Cocaine abuse (HCC) 04/24/2012   Coronary vasospasm (HCC) 04/24/2012   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   Transaminitis 04/20/2012   CAD (coronary artery disease) 04/19/2012   Hyperlipidemia 04/19/2012   Essential hypertension    Alcohol abuse    Conditions to be addressed/monitored per PCP order:  Chronic healthcare management needs, tobacco use, chronic pain, CAD, HTN, Afib, CHF, COPD, HLD, Hepatitis C, anxiety  Care Plan : RN Care Manager Plan of Care  Updates made by Kevin Chandler, RN since 07/23/2021 12:00 AM     Problem: Health Promotion or Disease Self-Management (General Plan of Care)      Long-Range Goal: Chronic Disease Managment   Start Date: 07/23/2021   Expected End Date: 10/22/2021  This Visit's Progress: Not on track  Priority: High  Note:   Current Barriers:  Chronic Disease Management support and education needs related to Tobacco Use and chronic pain   RNCM Clinical Goal(s):  Patient will verbalize understanding of plan for management of Tobacco Use and chronic pain  as evidenced by patient report take all medications exactly as prescribed and will call provider for medication related questions as evidenced by patient report demonstrate understanding of rationale for each prescribed medication as evidenced by patient report attend all scheduled medical appointments as evidenced by patient report continue to work with RN Care Manager to address care management and care coordination needs related to  Tobacco Use and chronic pain  as evidenced by adherence to CM Team Scheduled appointments through collaboration with RN Care manager, provider, and care team.   Interventions: Inter-disciplinary care team collaboration (see longitudinal plan of care) Evaluation of current treatment plan related to  self management and patient's adherence to plan as established by provider Pain Interventions:  (Status:  New goal.) Long Term Goal Pain assessment performed Medications reviewed Reviewed provider established plan for pain management Discussed importance of adherence to all scheduled medical appointments Counseled on the importance of reporting any/all new or changed pain symptoms or management strategies to pain management provider Advised patient to report to care team affect of pain on daily activities Assessed social determinant of health barriers Patient given 1-800 QUITNOW number   Patient Goals/Self-Care Activities: Take all medications as prescribed Attend all scheduled provider appointments Call pharmacy for medication refills 3-7 days in advance of running out of medications Perform all self care activities independently  Perform  IADL's (shopping, preparing meals, housekeeping, managing finances) independently Call provider office for new concerns  or questions   Follow Up Plan:  The patient has been provided with contact information for the care management team and has been advised to call with any health related questions or concerns.  The care management team will reach out to the patient again over the next 30 days.   Long-Range Goal: Establish Plan of Care for Chronic Disease Management Needs   Start Date: 07/23/2021  Expected End Date: 10/22/2021  Priority: High  Note:   Timeframe:  Long-Range Goal Priority:  High Start Date:      07/23/21                       Expected End Date:  ongoing                     Follow Up Date 08/22/21    - schedule appointment for flu shot - schedule appointment for vaccines needed due to my age or health - schedule recommended health tests - schedule and keep appointment for annual check-up    Why is this important?   Screening tests can find diseases early when they are easier to treat.  Your doctor or nurse will talk with you about which tests are important for you.  Getting shots for common diseases like the flu and shingles will help prevent them.     Follow Up:  Patient agrees to Care Plan and Follow-up.  Plan: The Managed Medicaid care management team will reach out to the patient again over the next 30 days. and The  Patient has been provided with contact information for the Managed Medicaid care management team and has been advised to call with any health related questions or concerns.  Date/time of next scheduled RN care management/care coordination outreach:  08/28/21 at 330.

## 2021-07-23 NOTE — Telephone Encounter (Signed)
Pts PCP Kevin Lor, NP called and stated that the pts BP is still low 94/50 HR 57. Please advise.  ?

## 2021-08-21 ENCOUNTER — Other Ambulatory Visit: Payer: Self-pay | Admitting: Student

## 2021-08-21 DIAGNOSIS — I25118 Atherosclerotic heart disease of native coronary artery with other forms of angina pectoris: Secondary | ICD-10-CM

## 2021-08-28 ENCOUNTER — Other Ambulatory Visit: Payer: Self-pay | Admitting: Obstetrics and Gynecology

## 2021-08-28 NOTE — Patient Instructions (Signed)
Visit Information  Mr. Benedicto Yazzie  - as a part of your Medicaid benefit, you are eligible for care management and care coordination services at no cost or copay. I was unable to reach you by phone today but would be happy to help you with your health related needs. Please feel free to call me at [phone].  A member of the Managed Medicaid care management team will reach out to you again over the next 30 business days.   Refoel Palladino RN, BSN [place]  [facility] Care Management Coordinator - Managed Medicaid High Risk [phone]   

## 2021-08-28 NOTE — Patient Outreach (Signed)
Care Coordination ? ?08/28/2021 ? ?[name] ?[birth year] ?[id number] ? ? ?Medicaid Managed Care  ? ?Unsuccessful Outreach Note ? ?08/28/2021 ?Name: Khai Arrona MRN: [id number] DOB: 03-13-1956 ? ?Referred by: Marletta Lor, NP ?Reason for referral : High Risk Managed Medicaid (Unsuccessful telephone outreach) ? ? ?An unsuccessful telephone outreach was attempted today. The patient was referred to the case management team for assistance with care management and care coordination.  ? ?Follow Up Plan: The care management team will reach out to the patient again over the next 30 business days.  ? ?SIGNATURE Paramedic, BSN ?[place]  [facility] ?Care Management Coordinator - Managed Medicaid High Risk ?[phone] ?  ? ?

## 2021-09-18 ENCOUNTER — Other Ambulatory Visit: Payer: Self-pay | Admitting: Student

## 2021-09-26 ENCOUNTER — Other Ambulatory Visit: Payer: Self-pay | Admitting: Obstetrics and Gynecology

## 2021-09-26 NOTE — Patient Outreach (Signed)
Medicaid Managed Care   Nurse Care Manager Note  09/26/2021 Name:  Kevin Orozco MRN:  [id number] DOB:  March 17, 1956  Kevin Orozco is an 66 y.o. year old male who is a primary patient of [name], NP.  The [facility] Managed Care Coordination team was consulted for assistance with:    Chronic healthcare management needs,  tobacco use, chronic pain, CAD, HTN, Afib, CHF, COPD, anxiety, HLD  [name] was given information about Medicaid Managed Care Coordination team services today. [name] agreed to services and verbal consent obtained.  Engaged with patient by telephone for follow up visit in response to provider referral for case management and/or care coordination services.   Assessments/Interventions:  Review of past medical history, allergies, medications, health status, including review of consultants reports, laboratory and other test data, was performed as part of comprehensive evaluation and provision of chronic care management services.  SDOH (Social Determinants of Health) assessments and interventions performed: SDOH Interventions    Flowsheet Row Most Recent Value  SDOH Interventions   Food Insecurity Interventions Intervention Not Indicated  Transportation Interventions Intervention Not Indicated      Care Plan  Allergies  Allergen Reactions   Etomidate Anxiety   Tape Other (See Comments)    SKIN WILL TEAR EASILY!!!!   Medications Reviewed Today     Reviewed by Gayla Medicus, RN (Registered Nurse) on 09/26/21 at [place] List Status: <None>   Medication Order Taking? Sig Documenting Provider Last Dose Status Informant  apixaban (ELIQUIS) 5 MG TABS tablet [id number]  Take 1 tablet (5 mg total) by mouth 2 (two) times daily. Cantwell, Celeste C, PA-C  Active   clonazePAM (KLONOPIN) 0.5 MG tablet [id number] No Take 0.5-1 mg by mouth See admin instructions. 05.mg bid and 1mg  every evening as needed for anxiety. [provider] Taking Active  Pharmacy Records           Med Note (DODD, AMBER L   Fri Jun 29, 2021 10:21 AM)    diltiazem (CARDIZEM CD) 120 MG 24 hr capsule [id number] No Take 1 capsule (120 mg total) by mouth daily. Alethia Berthold, PA-C Taking Active Pharmacy Records           Med Note (DODD, AMBER Carlean Jews   Fri Jun 29, 2021 10:24 AM)    dronedarone (MULTAQ) 400 MG tablet [id number] No Take 1 tablet (400 mg total) by mouth 2 (two) times daily with a meal. [name], [name], MD Taking Active   finasteride (PROSCAR) 5 MG tablet [id number] No Take 5 mg by mouth daily. [provider] Taking Active   furosemide (LASIX) 40 MG tablet [id number]  TAKE ONE TABLET BY MOUTH TWICE DAILY Cantwell, Celeste C, PA-C  Active   gabapentin (NEURONTIN) 300 MG capsule [id number] No Take 300 mg by mouth at bedtime. [provider] Taking Active Pharmacy Records           Med Note (DODD, AMBER Carlean Jews   Fri Jun 29, 2021 10:25 AM)    ipratropium-albuterol (DUONEB) 0.5-2.5 (3) MG/3ML SOLN [id number] No Take 3 mLs by nebulization 4 (four) times daily as needed (for shortness of breath or wheezing). [provider] Taking Active Pharmacy Records           Med Note (DODD, AMBER Carlean Jews   Fri Jun 29, 2021 10:25 AM)    isosorbide mononitrate (IMDUR) 30 MG 24 hr tablet [id number] No Take 1 tablet (30 mg total) by mouth daily. [name], MD Taking Expired  07/26/21 2359   metoprolol (TOPROL-XL) 200 MG 24 hr tablet [id number] No Take 100 mg by mouth daily. [provider] Taking Active   morphine (MS CONTIN) 30 MG 12 hr tablet [id number] No Take 30 mg by mouth every 12 (twelve) hours. [provider] Taking Active Pharmacy Records           Med Note (DODD, AMBER L   Fri Jun 29, 2021 10:30 AM)    MOVANTIK 12.5 MG TABS tablet [id number] No Take 12.5 mg by mouth every morning. [provider] Taking Active Pharmacy Records           Med Note (DODD, AMBER L   Fri Jun 29, 2021 10:24 AM)    Naloxone HCl (KLOXXADO) 8  MG/0.1ML LIQD [id number] No Place into the nose as needed (for accidental overdose). [provider] Taking Active Pharmacy Records           Med Note (DODD, AMBER L   Fri Jun 29, 2021 10:30 AM)    nitroGLYCERIN (NITROSTAT) 0.4 MG SL tablet [id number] No Place under the tongue every 5 (five) minutes as needed for chest pain. [provider] Taking Active Pharmacy Records  oxyCODONE-acetaminophen (PERCOCET) 10-325 MG tablet [id number] No Take 1 tablet by mouth every 4 (four) hours as needed for pain. [provider] Taking Active Pharmacy Records           Med Note (DODD, AMBER [name]   Fri Jun 29, 2021 10:30 AM)    pantoprazole (PROTONIX) 40 MG tablet [id number] No Take 40 mg by mouth daily before breakfast. [provider] Taking Active Pharmacy Records           Med Note (DODD, AMBER L   Fri Jun 29, 2021 10:22 AM)    predniSONE (DELTASONE) 10 MG tablet [id number] No Take 10 mg by mouth daily with breakfast. Continuous. [provider] Taking Active Pharmacy Records           Med Note (DODD, AMBER [name]   Fri Jun 29, 2021 10:24 AM)    ranolazine (RANEXA) 500 MG 12 hr tablet [id number]  TAKE ONE TABLET BY MOUTH TWICE DAILY Cantwell, Celeste C, PA-C  Active   rosuvastatin (CRESTOR) 10 MG tablet [id number] No Take 1 tablet (10 mg total) by mouth daily. Elder Negus, MD Taking Expired 06/29/21 2359 Pharmacy Records           Med Note (DODD, AMBER L   Fri Jun 29, 2021 10:22 AM)    sertraline (ZOLOFT) 50 MG tablet [id number] No Take 50 mg by mouth daily. [provider] Taking Active Pharmacy Records           Med Note Reyne Dumas, COURTNEY A   Thu Jun 21, 2021  1:44 PM) 04/30/21 #90 x 90  tamsulosin (FLOMAX) 0.4 MG CAPS capsule [id number] No Take 0.4 mg by mouth at bedtime. [provider] Taking Active   TRELEGY ELLIPTA 200-62.5-25 MCG/INH AEPB [id number] No Inhale 1 puff into the lungs daily. [provider] Taking Active Pharmacy Records            Med Note (DODD, AMBER [name]   Caleen Essex Jun 29, 2021 10:21 AM)    Med List Note Rolene Course 01/18/14 2041): PCP Tomi Bamberger           Patient Active Problem List   Diagnosis Date Noted   Anxiety 06/20/2021   Acute CHF (congestive heart failure) (HCC) 02/18/2020   Hypokalemia 02/18/2020  Atrial fibrillation with RVR ([place]) 02/18/2020   Acute on chronic systolic CHF (congestive heart failure) ([place]) 02/18/2020   Chest pain 07/09/2019   Claudication ([place]) 09/07/2015   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[place])    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)    Acute on chronic heart failure with preserved ejection fraction (HCC)    Tobacco abuse    Polysubstance abuse ([place])    Marijuana abuse    Chronic obstructive pulmonary disease (HCC)    Acute respiratory failure with hypoxia and hypercapnia (HCC)    Hepatitis, viral    Acute respiratory failure with hypoxia ([place]) 02/22/2015   Cocaine abuse ([place]) 04/24/2012   Coronary vasospasm ([place]) 04/24/2012   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   Transaminitis 04/20/2012   CAD (coronary artery disease) 04/19/2012   Hyperlipidemia 04/19/2012   Essential hypertension    Alcohol abuse    Conditions to be addressed/monitored per PCP order:  Chronic healthcare management needs, HTN, HLD, anxiety, tobacco use, CHF, COPD, CAD  Care Plan : RN Care Manager Plan of Care  Updates made by Gayla Medicus, RN since 09/26/2021 12:00 AM     Problem: Health Promotion or Disease Self-Management (General Plan of Care)      Long-Range Goal: Chronic Disease Managment   Start Date: 07/23/2021  Expected End Date: 12/27/2021  Recent Progress: Not on track  Priority: High  Note:   Current Barriers:  Chronic Disease Management support and education needs related to Tobacco Use and chronic pain  09/26/21:  patient states pain is better.   Continues to smoke 3/4 ppd-has appt with provider 10/01/21.  Recent adjustment in BP med, decreased dose.  BP better, 101/61 today.  RNCM Clinical Goal(s):  Patient will verbalize understanding of plan for management of Tobacco Use and chronic pain  as evidenced by patient report take all medications exactly as prescribed and will call provider for medication related questions as evidenced by patient report demonstrate understanding of rationale for each prescribed medication as evidenced by patient report attend all scheduled medical appointments as evidenced by patient report continue to work with RN Care Manager to address care management and care coordination needs related to  Tobacco Use and chronic pain  as evidenced by adherence to CM Team Scheduled appointments through collaboration with RN Care manager, provider, and care team.   Interventions: Inter-disciplinary care team collaboration (see longitudinal plan of care) Evaluation of current treatment plan related to  self management and patient's adherence to plan as established by provider Pain Interventions:  (Status:  New goal.) Long Term Goal Pain assessment performed Medications reviewed Reviewed provider established plan for pain management Discussed importance of adherence to all scheduled medical appointments Counseled on the importance of reporting any/all new or changed pain symptoms or management strategies to pain management provider Advised patient to report to care team affect of pain on daily activities Assessed social determinant of health barriers Patient given 1-800 [place] number   Patient Goals/Self-Care Activities: Take all medications as prescribed Attend all scheduled provider appointments Call pharmacy for medication refills 3-7 days in advance of running out of medications Perform all self care activities independently  Perform IADL's (shopping, preparing meals, housekeeping, managing finances)  independently Call provider office for new concerns or questions   Follow Up Plan:  The patient has been provided with contact information for the care management team and  has been advised to call with any health related questions or concerns.  The care management team will reach out to the patient again over the next 30 days   Long-Range Goal: Establish Plan of Care for Chronic Disease Management Needs   Start Date: 07/23/2021  Expected End Date: 12/27/2021  Priority: High  Note:   Timeframe:  Long-Range Goal Priority:  High Start Date:      07/23/21                       Expected End Date:  ongoing                     Follow Up Date 10/29/21   - schedule appointment for flu shot - schedule appointment for vaccines needed due to my age or health - schedule recommended health tests - schedule and keep appointment for annual check-up    Why is this important?   Screening tests can find diseases early when they are easier to treat.  Your doctor or nurse will talk with you about which tests are important for you.  Getting shots for common diseases like the flu and shingles will help prevent them.  09/26/21:  patient has appt 10/01/21 with provider   Follow Up:  Patient agrees to Care Plan and Follow-up.  Plan: The Managed Medicaid care management team will reach out to the patient again over the next 30 days. and The  Patient has been provided with contact information for the Managed Medicaid care management team and has been advised to call with any health related questions or concerns.  Date/time of next scheduled RN care management/care coordination outreach: 10/29/21 at 1030.

## 2021-09-26 NOTE — Patient Instructions (Signed)
Hey Kevin Orozco, thank you for speaking with me today-have a good afternoon.  Kevin Orozco was given information about Medicaid Managed Care team care coordination services as a part of their [facility] Medicaid benefit. Kevin Orozco verbally consented to engagement with the [facility] Managed Care team.   If you are experiencing a medical emergency, please call 911 or report to your local emergency department or urgent care.   If you have a non-emergency medical problem during routine business hours, please contact your provider's office and ask to speak with a nurse.   For questions related to your [facility] health plan, please call: [phone] or go here:[email or web address]  If you would like to schedule transportation through your [facility] plan, please call the following number at least 2 days in advance of your appointment: [phone].  You can also use the MTM portal or MTM mobile app to manage your rides. For the portal, please go to [email or web address]  Call the [facility] Crisis Line at [phone], at any time, 24 hours a day, 7 days a week. If you are in danger or need immediate medical attention call 911.  If you would like help to quit smoking, call 1-800-QUIT-NOW ((765)265-0137) OR Espaol: 1-855-Djelo-Ya (3-536-144-3154) o para ms informacin haga clic aqu or Text READY to 008-676 to register via text  Kevin Orozco - following are the goals we discussed in your visit today:   Goals Addressed    Long-Range Goal: Establish Plan of Care for Chronic Disease Management Needs   Start Date: 07/23/2021  Expected End Date: 12/27/2021  Priority: High  Note:   Timeframe:  Long-Range Goal Priority:  High Start Date:      07/23/21                       Expected End Date:  ongoing                     Follow Up Date 10/29/21   - schedule appointment for flu shot - schedule appointment for vaccines needed due to my age or health - schedule recommended  health tests - schedule and keep appointment for annual check-up    Why is this important?   Screening tests can find diseases early when they are easier to treat.  Your doctor or nurse will talk with you about which tests are important for you.  Getting shots for common diseases like the flu and shingles will help prevent them.  09/26/21:  patient has appt 10/01/21 with provider.   Patient verbalizes understanding of instructions and care plan provided today and agrees to view in MyChart. Active MyChart status and patient understanding of how to access instructions and care plan via MyChart confirmed with patient.     The Managed Medicaid care management team will reach out to the patient again over the next 30 business  days.  The  Patient   has been provided with contact information for the Managed Medicaid care management team and has been advised to call with any health related questions or concerns.   Kathi Der RN, BSN [place]  [facility] Care Management Coordinator - Managed Medicaid High Risk [phone]   Following is a copy of your plan of care:  Care Plan : RN Care Manager Plan of Care  Updates made by Danie Chandler, RN since 09/26/2021 12:00 AM     Problem: Health Promotion or Disease Self-Management (General Plan of Care)  Long-Range Goal: Chronic Disease Managment   Start Date: 07/23/2021  Expected End Date: 12/27/2021  Recent Progress: Not on track  Priority: High  Note:   Current Barriers:  Chronic Disease Management support and education needs related to Tobacco Use and chronic pain  09/26/21:  patient states pain is better.  Continues to smoke 3/4 ppd-has appt with provider 10/01/21.  Recent adjustment in BP med, decreased dose.  BP better, 101/61 today.  RNCM Clinical Goal(s):  Patient will verbalize understanding of plan for management of Tobacco Use and chronic pain  as evidenced by patient report take all medications exactly as prescribed  and will call provider for medication related questions as evidenced by patient report demonstrate understanding of rationale for each prescribed medication as evidenced by patient report attend all scheduled medical appointments as evidenced by patient report continue to work with RN Care Manager to address care management and care coordination needs related to  Tobacco Use and chronic pain  as evidenced by adherence to CM Team Scheduled appointments through collaboration with RN Care manager, provider, and care team.   Interventions: Inter-disciplinary care team collaboration (see longitudinal plan of care) Evaluation of current treatment plan related to  self management and patient's adherence to plan as established by provider Pain Interventions:  (Status:  New goal.) Long Term Goal Pain assessment performed Medications reviewed Reviewed provider established plan for pain management Discussed importance of adherence to all scheduled medical appointments Counseled on the importance of reporting any/all new or changed pain symptoms or management strategies to pain management provider Advised patient to report to care team affect of pain on daily activities Assessed social determinant of health barriers Patient given 1-800 QUITNOW number   Patient Goals/Self-Care Activities: Take all medications as prescribed Attend all scheduled provider appointments Call pharmacy for medication refills 3-7 days in advance of running out of medications Perform all self care activities independently  Perform IADL's (shopping, preparing meals, housekeeping, managing finances) independently Call provider office for new concerns or questions   Follow Up Plan:  The patient has been provided with contact information for the care management team and has been advised to call with any health related questions or concerns.  The care management team will reach out to the patient again over the next 30 days.

## 2021-10-01 ENCOUNTER — Ambulatory Visit: Payer: Medicaid Other | Admitting: Student

## 2021-10-01 NOTE — Progress Notes (Deleted)
Patient referred by [name], NP for chest pain  Subjective:   Kevin Orozco, male    DOB: [birth year], 66 y.o.   MRN: [id number]   No chief complaint on file.    HPI  66 y.o. male with hypertension, paroxysmal Afib, moderate nonobstructive coronary artery disease (cath 2012), COPD, active smoker, prior h/o polysubstance abuse (EtOH, cocaine).  Patient has been unable to tolerate HCTZ in the past due to dizziness and falls.   Patient was diagnosed with HFrEF and A-fib with RVR in 01/2020, subsequently cardioverted in 02/2020.  He was then lost to follow-up until 01/2021 at which time patient admitted to medication noncompliance, therefore he was restarted on rate control medications given recurrence of A-fib with RVR.  He remained in A-fib with RVR and symptomatic, therefore underwent direct-current cardioversion 02/23/2021.   Patient was again lost to follow-up and presented to our office urgently 06/05/2021 with dyspnea on exertion and EKG revealing atrial fibrillation with RVR.  At that time patient was not taking Multaq.  He was restarted on Multaq and set up for direct-current cardioversion, however on 06/19/2021 patient presented to the emergency department and was subsequently admitted 06/20/2021 - 06/26/2021.  Patient was diagnosed with acute PE, likely he is noncompliant with Xarelto and was therefore switched to Coumadin.  Patient was also diuresed and underwent direct-current cardioversion 06/19/2021.  Patient was discharged with Lasix 40 mg p.o. twice daily, Multaq 400 mg p.o. twice daily, diltiazem 120 mg p.o. daily, as well as therapy for CAD including Imdur, Ranexa, statin therapy.   Patient now presents for follow-up.  He is using pill packs and reports medication compliance.  He has been followed by hematology and PCP for management of PE and Coumadin.  Patient reports dyspnea on exertion has significantly improved.  Denies chest pain.  He does report episodes of intermittent  dizziness since discharge.  Notably his blood pressure is soft today.  Patient denies evidence of bleeding diathesis.  Unfortunately patient does continue to smoke approximately 1 pack/day.   Current Outpatient Medications on File Prior to Visit  Medication Sig Dispense Refill   apixaban (ELIQUIS) 5 MG TABS tablet Take 1 tablet (5 mg total) by mouth 2 (two) times daily. 60 tablet 5   clonazePAM (KLONOPIN) 0.5 MG tablet Take 0.5-1 mg by mouth See admin instructions. 05.mg bid and 1mg  every evening as needed for anxiety.     diltiazem (CARDIZEM CD) 120 MG 24 hr capsule Take 1 capsule (120 mg total) by mouth daily. 90 capsule 3   dronedarone (MULTAQ) 400 MG tablet Take 1 tablet (400 mg total) by mouth 2 (two) times daily with a meal. 60 tablet 2   finasteride (PROSCAR) 5 MG tablet Take 5 mg by mouth daily.     furosemide (LASIX) 40 MG tablet TAKE ONE TABLET BY MOUTH TWICE DAILY 60 tablet 2   gabapentin (NEURONTIN) 300 MG capsule Take 300 mg by mouth at bedtime.     ipratropium-albuterol (DUONEB) 0.5-2.5 (3) MG/3ML SOLN Take 3 mLs by nebulization 4 (four) times daily as needed (for shortness of breath or wheezing).     isosorbide mononitrate (IMDUR) 30 MG 24 hr tablet Take 1 tablet (30 mg total) by mouth daily. 30 tablet 0   metoprolol (TOPROL-XL) 200 MG 24 hr tablet Take 100 mg by mouth daily.     morphine (MS CONTIN) 30 MG 12 hr tablet Take 30 mg by mouth every 12 (twelve) hours.     MOVANTIK 12.5 MG  TABS tablet Take 12.5 mg by mouth every morning.     Naloxone HCl (KLOXXADO) 8 MG/0.1ML LIQD Place into the nose as needed (for accidental overdose).     nitroGLYCERIN (NITROSTAT) 0.4 MG SL tablet Place under the tongue every 5 (five) minutes as needed for chest pain.     oxyCODONE-acetaminophen (PERCOCET) 10-325 MG tablet Take 1 tablet by mouth every 4 (four) hours as needed for pain.     pantoprazole (PROTONIX) 40 MG tablet Take 40 mg by mouth daily before breakfast.     predniSONE (DELTASONE) 10  MG tablet Take 10 mg by mouth daily with breakfast. Continuous.     ranolazine (RANEXA) 500 MG 12 hr tablet TAKE ONE TABLET BY MOUTH TWICE DAILY 60 tablet 6   rosuvastatin (CRESTOR) 10 MG tablet Take 1 tablet (10 mg total) by mouth daily. 90 tablet 1   sertraline (ZOLOFT) 50 MG tablet Take 50 mg by mouth daily.     tamsulosin (FLOMAX) 0.4 MG CAPS capsule Take 0.4 mg by mouth at bedtime.     TRELEGY ELLIPTA 200-62.5-25 MCG/INH AEPB Inhale 1 puff into the lungs daily.     No current facility-administered medications on file prior to visit.    Cardiovascular and other pertinent studies: EKG 06/29/2021: Sinus bradycardia at a rate of 50 bpm.  Normal axis.  Left atrial enlargement.  No evidence of ischemia or underlying injury pattern.  Normal QT.   Echocardiogram 06/20/2021:   1. Left ventricular ejection fraction, by estimation, is 60 to 65%. The left ventricle has normal function. The left ventricle has no regional wall motion abnormalities. There is mild left ventricular hypertrophy. Left ventricular diastolic parameters are indeterminate.   2. Right ventricular systolic function is moderately reduced. The right ventricular size is normal.   3. Left atrial size was severely dilated.   4. Right atrial size was moderately dilated.   5. The mitral valve is grossly normal. Mild mitral valve regurgitation.   6. The aortic valve is grossly normal. Aortic valve regurgitation is trivial.   Direct-current cardioversion in the ED 06/19/2021:  Successful return to normal sinus rhythm with single shock at 100 J  EKG 06/05/2021:  Atrial fibrillation with rapid ventricular response at a rate of 144 bpm.  Direct-current cardioversion in the ED 02/23/2021 Successful return to normal sinus rhythm from atrial fibrillation with RVR.  EKG 02/23/2021: Atrial fibrillation with rapid ventricular response at a rate of 121 bpm.  PCV ECHOCARDIOGRAM COMPLETE 02/13/2021 Left ventricle cavity is normal in size.  Moderate concentric hypertrophy of the left ventricle. Normal global wall motion. Normal LV systolic function with visual EF 50-55%. Diastolic function not assessed due to atrial fibrillation. Left atrial cavity is mildly dilated. Trileaflet aortic valve. Moderate aortic valve leaflet calcification. Mild (Grade I) aortic regurgitation. Mild mitral valve leaflet calcification. Mildly restricted mitral valve leaflets without significant stenosis. Moderate (Grade III) mitral regurgitation. Mild to moderate tricuspid regurgitation. Estimated pulmonary artery systolic pressure 29 mmHg. Mild to moderate pulmonic regurgitation. No significant change compared to previous study in 2021.  EKG 07/12/2019: Atrial fibrillation, controlled ventricular rate 90 bpm. Nonspecific T-abnormality.   Coronary angiography 2012: LM: Normal LAD: Ostial 40-50% stenosis, mid LAD 30% stenosis LCx: OM2 30% stenosis RCA: Mid 20% stenosis and posterior lateral branch   Recent labs:    Latest Ref Rng & Units 06/25/2021   11:55 AM 06/23/2021    2:41 AM 06/21/2021    4:49 AM  CMP  Glucose 70 - 99 mg/dL 89  83   BUN 8 - 23 mg/dL 13   20   Creatinine 0.61 - 1.24 mg/dL 0.98  0.84  1.06   Sodium 135 - 145 mmol/L 135   136   Potassium 3.5 - 5.1 mmol/L 4.2  3.4  3.8   Chloride 98 - 111 mmol/L 102   100   CO2 22 - 32 mmol/L 24   24   Calcium 8.9 - 10.3 mg/dL 8.3   8.6       Latest Ref Rng & Units 06/25/2021    3:29 AM 06/24/2021    1:50 AM 06/23/2021    2:41 AM  CBC  WBC 4.0 - 10.5 K/uL 6.3  8.0  10.3   Hemoglobin 13.0 - 17.0 g/dL 12.7  11.5  11.8   Hematocrit 39.0 - 52.0 % 37.9  34.6  34.1   Platelets 150 - 400 K/uL 151  155  126    Lipid Panel     Component Value Date/Time   CHOL 83 06/21/2021 0449   TRIG 37 06/21/2021 0449   HDL 37 (L) 06/21/2021 0449   CHOLHDL 2.2 06/21/2021 0449   VLDL 7 06/21/2021 0449   LDLCALC 39 06/21/2021 0449   HEMOGLOBIN A1C Lab Results  Component Value Date   HGBA1C 5.4 09/09/2019    MPG 97 02/22/2015   TSH No results for input(s): "TSH" in the last 8760 hours.  02/01/2021: BUN 14, creatinine 0.87, GFR >60, sodium 142, potassium 3.8, AST 32, ALT 27 Total cholesterol 101, triglycerides 41, HDL 47, LDL 43 BNP pending  09/09/2019: HbA1C 5.4%  12/01/2018: H/H 13/38.2. MCV 97. Platelets 157   Review of Systems  Constitutional: Negative for malaise/fatigue and weight gain.  Cardiovascular:  Positive for dyspnea on exertion (improved). Negative for chest pain, claudication, leg swelling, near-syncope, orthopnea, palpitations, paroxysmal nocturnal dyspnea and syncope.  Respiratory:  Negative for shortness of breath.   Neurological:  Positive for dizziness (intermittent, improved).         There were no vitals filed for this visit.    There is no height or weight on file to calculate BMI. There were no vitals filed for this visit.    Objective:   Physical Exam Vitals reviewed.  Constitutional:      Appearance: He is well-developed.  Neck:     Vascular: No JVD.  Cardiovascular:     Rate and Rhythm: Regular rhythm. Bradycardia present.     Pulses: Intact distal pulses.     Heart sounds: S1 normal and S2 normal. No murmur heard.    No gallop.  Pulmonary:     Effort: No respiratory distress.     Breath sounds: Normal breath sounds. No wheezing, rhonchi or rales.  Musculoskeletal:     Right lower leg: No edema.     Left lower leg: No edema.  Neurological:     Mental Status: He is alert.        Assessment & Recommendations:   66 y.o. male with hypertension, paroxysmal Afib, moderate nonobstructive coronary artery disease (cath 2012), COPD, active smoker, prior h/o polysubstance abuse (EtOH, cocaine).   Chronic HFpEF:  Echocardiogram 06/2021 reveals normal LVEF with mild LVH, dilated LA and RA as well as mild mitral regurgitation. There is no clinical evidence of heart failure at today's visit Suspect acute on chronic heart failure  contributing to patient's acute respiratory failure as he was diuresed approximately 4 L which correlated with improving respiratory status. Continue Lasix 40 mg p.o. twice daily, Imdur,  metoprolol. Patient has been on spironolactone, however given her blood pressure and dizziness we will stop spironolactone   Paroxysmal atrial fibrillation: Rate control: Diltiazem, metoprolol Rhythm control: Multaq Thromboembolic prophylaxis: Coumadin -patient developed bilateral PE while on Xarelto, although suspect lack of compliance played a role in this. Anticoagulations now being managed by hematology and PCP, will defer to them CHA2DS2VAsc score 4, annual stroke risk 5% Patient continues to maintain normal sinus rhythm since cardioversion 06/19/2021 Continue Multaq, metoprolol, and diltiazem   CAD: Patient denies anginal symptoms He is not on aspirin due to ongoing use of anticoagulation with Coumadin Continue rosuvastatin, metoprolol, Imdur, and Ranexa Lipids are well controlled  Hypertension: Blood pressure is soft at today's office visit patient complaining of intermittent dizziness We will stop spironolactone Continue metoprolol, Imdur, diltiazem  Follow up in 3 months, sooner if needed.    Alethia Berthold, PA-C 10/01/2021, 8:45 AM Office: [phone]

## 2021-10-29 ENCOUNTER — Other Ambulatory Visit: Payer: Self-pay | Admitting: Obstetrics and Gynecology

## 2021-10-29 NOTE — Patient Outreach (Signed)
  Medicaid Managed Care   Unsuccessful Attempt Note   10/29/2021 Name: Kevin Orozco MRN: [id number] DOB: June 24, 1955  Referred by: Marletta Lor, NP Reason for referral : High Risk Managed Medicaid (Unsuccessful telephone outreach)   An unsuccessful telephone outreach was attempted today. The patient was referred to the case management team for assistance with care management and care coordination.    Follow Up Plan: The Managed Medicaid care management team will reach out to the patient again over the next 30 business  days.    Kathi Der RN, BSN [place]  [facility] [employer] - Managed Medicaid High Risk [phone]

## 2021-10-29 NOTE — Patient Instructions (Signed)
Visit Information  [name]  - as a part of your Medicaid benefit, you are eligible for care management and care coordination services at no cost or copay. I was unable to reach you by phone today but would be happy to help you with your health related needs. Please feel free to call me at [phone].  A member of the Managed Medicaid care management team will reach out to you again over the next 30 business days.   Kathi Der RN, BSN [place]  [facility] - Managed Medicaid High Risk [phone]

## 2021-11-30 ENCOUNTER — Telehealth: Payer: Self-pay

## 2021-11-30 ENCOUNTER — Other Ambulatory Visit: Payer: Self-pay | Admitting: Obstetrics and Gynecology

## 2021-11-30 NOTE — Patient Instructions (Signed)
Hi Mr. Helming, I am sorry to have missed you today, I hope you are doing okay- as a part of your Medicaid benefit, you are eligible for care management and care coordination services at no cost or copay. I was unable to reach you by phone today but would be happy to help you with your health related needs. Please feel free to call me at [phone].  A member of the Managed Medicaid care management team will reach out to you again over the next 30 business days.   Kathi Der RN, BSN [place]  [facility] [employer] - Managed Medicaid High Risk [phone]

## 2021-11-30 NOTE — Patient Outreach (Signed)
Care Coordination  11/30/2021  Kevin Orozco [birth year] [id number]   Medicaid Managed Care   Unsuccessful Outreach Note  11/30/2021 Name: Kevin Orozco: [id number] DOB: 1955-09-25  Referred by: Marletta Lor, NP Reason for referral : High Risk Managed Medicaid (Unsuccessful telephone outreach)   A second unsuccessful telephone outreach was attempted today. The patient was referred to the case management team for assistance with care management and care coordination.   Follow Up Plan: The care management team will reach out to the patient again over the next 30 business  days.   Kathi Der RN, BSN [place]  [facility] [employer] - Managed Medicaid High Risk [phone]

## 2021-11-30 NOTE — Telephone Encounter (Signed)
Pt called on-call phone and spoke with Dr. Jacinto Halim of the matter  Per Dr. Jacinto Halim pt was put on the schedule to see Dr. Melton Alar on Wednesday 16 @ 9:30

## 2021-12-05 ENCOUNTER — Ambulatory Visit: Payer: Medicaid Other | Admitting: Internal Medicine

## 2021-12-11 ENCOUNTER — Encounter: Payer: Self-pay | Admitting: Internal Medicine

## 2021-12-11 ENCOUNTER — Ambulatory Visit: Payer: Medicaid Other | Admitting: Internal Medicine

## 2021-12-11 VITALS — BP 106/62 | HR 60 | Temp 98.0°F | Resp 16 | Ht 71.0 in | Wt 215.0 lb

## 2021-12-11 DIAGNOSIS — I10 Essential (primary) hypertension: Secondary | ICD-10-CM

## 2021-12-11 DIAGNOSIS — I480 Paroxysmal atrial fibrillation: Secondary | ICD-10-CM | POA: Insufficient documentation

## 2021-12-11 DIAGNOSIS — I5032 Chronic diastolic (congestive) heart failure: Secondary | ICD-10-CM

## 2021-12-11 NOTE — Progress Notes (Signed)
Patient referred by Marletta Lor, NP for chest pain  Subjective:   Kevin Orozco, male    DOB: [birth year], 66 y.o.   MRN: [id number]   Chief Complaint  Patient presents with   Atrial Fibrillation   Follow-up   Shortness of Breath     Atrial Fibrillation Presents for follow-up visit. Symptoms are negative for chest pain, dizziness (intermittent, improved), palpitations, shortness of breath and syncope. Past medical history includes atrial fibrillation.  Shortness of Breath Pertinent negatives include no chest pain, claudication, leg swelling, orthopnea, PND or syncope.    66 y.o. male with hypertension, paroxysmal Afib, moderate nonobstructive coronary artery disease (cath 2012), COPD, active smoker, prior h/o polysubstance abuse (EtOH, cocaine).  Denies chest pain, shortness of breath, palpitations, diaphoresis, syncope, edema, PND, orthopnea.     Current Outpatient Medications on File Prior to Visit  Medication Sig Dispense Refill   apixaban (ELIQUIS) 5 MG TABS tablet Take 1 tablet (5 mg total) by mouth 2 (two) times daily. 60 tablet 5   clonazePAM (KLONOPIN) 0.5 MG tablet Take 0.5-1 mg by mouth See admin instructions. 05.mg bid and 1mg  every evening as needed for anxiety.     dronedarone (MULTAQ) 400 MG tablet Take 1 tablet (400 mg total) by mouth 2 (two) times daily with a meal. 60 tablet 2   finasteride (PROSCAR) 5 MG tablet Take 5 mg by mouth daily.     furosemide (LASIX) 40 MG tablet TAKE ONE TABLET BY MOUTH TWICE DAILY 60 tablet 2   gabapentin (NEURONTIN) 300 MG capsule Take 300 mg by mouth at bedtime.     ipratropium-albuterol (DUONEB) 0.5-2.5 (3) MG/3ML SOLN Take 3 mLs by nebulization 4 (four) times daily as needed (for shortness of breath or wheezing).     metoprolol (TOPROL-XL) 200 MG 24 hr tablet Take 100 mg by mouth daily.     morphine (MS CONTIN) 30 MG 12 hr tablet Take 30 mg by mouth every 12 (twelve) hours.     MOVANTIK 12.5 MG TABS tablet Take 12.5 mg by  mouth every morning.     Naloxone HCl (KLOXXADO) 8 MG/0.1ML LIQD Place into the nose as needed (for accidental overdose).     nitroGLYCERIN (NITROSTAT) 0.4 MG SL tablet Place under the tongue every 5 (five) minutes as needed for chest pain.     oxyCODONE-acetaminophen (PERCOCET) 10-325 MG tablet Take 1 tablet by mouth every 4 (four) hours as needed for pain.     pantoprazole (PROTONIX) 40 MG tablet Take 40 mg by mouth daily before breakfast.     predniSONE (DELTASONE) 10 MG tablet Take 10 mg by mouth daily with breakfast. Continuous.     ranolazine (RANEXA) 500 MG 12 hr tablet TAKE ONE TABLET BY MOUTH TWICE DAILY 60 tablet 6   rivaroxaban (XARELTO) 2.5 MG TABS tablet      sertraline (ZOLOFT) 50 MG tablet Take 50 mg by mouth daily.     tamsulosin (FLOMAX) 0.4 MG CAPS capsule Take 0.4 mg by mouth at bedtime.     traZODone (DESYREL) 100 MG tablet Take 100 mg by mouth at bedtime.     TRELEGY ELLIPTA 200-62.5-25 MCG/INH AEPB Inhale 1 puff into the lungs daily.     isosorbide mononitrate (IMDUR) 30 MG 24 hr tablet Take 1 tablet (30 mg total) by mouth daily. 30 tablet 0   rosuvastatin (CRESTOR) 10 MG tablet Take 1 tablet (10 mg total) by mouth daily. 90 tablet 1   No current facility-administered medications on file  prior to visit.    Cardiovascular and other pertinent studies: EKG 06/29/2021: Sinus bradycardia at a rate of 50 bpm.  Normal axis.  Left atrial enlargement.  No evidence of ischemia or underlying injury pattern.  Normal QT.   Echocardiogram 06/20/2021:   1. Left ventricular ejection fraction, by estimation, is 60 to 65%. The left ventricle has normal function. The left ventricle has no regional wall motion abnormalities. There is mild left ventricular hypertrophy. Left ventricular diastolic parameters are indeterminate.   2. Right ventricular systolic function is moderately reduced. The right ventricular size is normal.   3. Left atrial size was severely dilated.   4. Right atrial size  was moderately dilated.   5. The mitral valve is grossly normal. Mild mitral valve regurgitation.   6. The aortic valve is grossly normal. Aortic valve regurgitation is trivial.   Direct-current cardioversion in the ED 06/19/2021:  Successful return to normal sinus rhythm with single shock at 100 J  EKG 06/05/2021:  Atrial fibrillation with rapid ventricular response at a rate of 144 bpm.  Direct-current cardioversion in the ED 02/23/2021 Successful return to normal sinus rhythm from atrial fibrillation with RVR.  EKG 02/23/2021: Atrial fibrillation with rapid ventricular response at a rate of 121 bpm.  PCV ECHOCARDIOGRAM COMPLETE 02/13/2021 Left ventricle cavity is normal in size. Moderate concentric hypertrophy of the left ventricle. Normal global wall motion. Normal LV systolic function with visual EF 50-55%. Diastolic function not assessed due to atrial fibrillation. Left atrial cavity is mildly dilated. Trileaflet aortic valve. Moderate aortic valve leaflet calcification. Mild (Grade I) aortic regurgitation. Mild mitral valve leaflet calcification. Mildly restricted mitral valve leaflets without significant stenosis. Moderate (Grade III) mitral regurgitation. Mild to moderate tricuspid regurgitation. Estimated pulmonary artery systolic pressure 29 mmHg. Mild to moderate pulmonic regurgitation. No significant change compared to previous study in 2021.  EKG 07/12/2019: Atrial fibrillation, controlled ventricular rate 90 bpm. Nonspecific T-abnormality.   EKG 12/12/2019: Sinus bradycardia, rate 58 Nonspecific T-abnormality.   Coronary angiography 2012: LM: Normal LAD: Ostial 40-50% stenosis, mid LAD 30% stenosis LCx: OM2 30% stenosis RCA: Mid 20% stenosis and posterior lateral branch   Recent labs:    Latest Ref Rng & Units 06/25/2021   11:55 AM 06/23/2021    2:41 AM 06/21/2021    4:49 AM  CMP  Glucose 70 - 99 mg/dL 89   83   BUN 8 - 23 mg/dL 13   20   Creatinine 4.25 - 1.24  mg/dL 9.56  3.87  5.64   Sodium 135 - 145 mmol/L 135   136   Potassium 3.5 - 5.1 mmol/L 4.2  3.4  3.8   Chloride 98 - 111 mmol/L 102   100   CO2 22 - 32 mmol/L 24   24   Calcium 8.9 - 10.3 mg/dL 8.3   8.6       Latest Ref Rng & Units 06/25/2021    3:29 AM 06/24/2021    1:50 AM 06/23/2021    2:41 AM  CBC  WBC 4.0 - 10.5 K/uL 6.3  8.0  10.3   Hemoglobin 13.0 - 17.0 g/dL 33.2  95.1  88.4   Hematocrit 39.0 - 52.0 % 37.9  34.6  34.1   Platelets 150 - 400 K/uL 151  155  126    Lipid Panel     Component Value Date/Time   CHOL 83 06/21/2021 0449   TRIG 37 06/21/2021 0449   HDL 37 (L) 06/21/2021 0449   CHOLHDL  2.2 06/21/2021 0449   VLDL 7 06/21/2021 0449   LDLCALC 39 06/21/2021 0449   HEMOGLOBIN A1C Lab Results  Component Value Date   HGBA1C 5.4 09/09/2019   MPG 97 02/22/2015   TSH No results for input(s): "TSH" in the last 8760 hours.  02/01/2021: BUN 14, creatinine 0.87, GFR >60, sodium 142, potassium 3.8, AST 32, ALT 27 Total cholesterol 101, triglycerides 41, HDL 47, LDL 43 BNP pending  09/09/2019: HbA1C 5.4%  12/01/2018: H/H 13/38.2. MCV 97. Platelets 157   Review of Systems  Constitutional: Negative for malaise/fatigue and weight gain.  Cardiovascular:  Positive for dyspnea on exertion (improved). Negative for chest pain, claudication, leg swelling, near-syncope, orthopnea, palpitations, paroxysmal nocturnal dyspnea and syncope.  Respiratory:  Negative for shortness of breath.   Neurological:  Negative for dizziness (intermittent, improved).         Vitals:   12/11/21 1332  BP: 106/62  Pulse: 60  Resp: 16  Temp: 98 F (36.7 C)  SpO2: 92%     Body mass index is 29.99 kg/m. Filed Weights   12/11/21 1332  Weight: 215 lb (97.5 kg)     Objective:   Physical Exam Vitals reviewed.  Constitutional:      Appearance: He is well-developed.  Neck:     Vascular: No JVD.  Cardiovascular:     Rate and Rhythm: Regular rhythm. Bradycardia present.      Pulses: Intact distal pulses.     Heart sounds: S1 normal and S2 normal. No murmur heard.    No gallop.  Pulmonary:     Effort: No respiratory distress.     Breath sounds: Normal breath sounds. No wheezing, rhonchi or rales.  Musculoskeletal:     Right lower leg: No edema.     Left lower leg: No edema.  Neurological:     Mental Status: He is alert.        Assessment & Recommendations:   66 y.o. male with hypertension, paroxysmal Afib, moderate nonobstructive coronary artery disease (cath 2012), COPD, active smoker, prior h/o polysubstance abuse (EtOH, cocaine).     Paroxysmal atrial fibrillation: Rate control:  metoprolol Rhythm control: Multaq Thromboembolic prophylaxis: Eliquis CHA2DS2VAsc score 4, annual stroke risk 5% Patient continues to maintain normal sinus rhythm since cardioversion 06/19/2021 Continue Multaq, metoprolol, and Eliquis   Discontinue cardizem as HR and BP are low and pt is in sinus rhythm, continue toprol and multaq  Follow up in 3 months, sooner if needed.    Floydene Flock, DO, [facility] 12/11/2021, 1:40 PM Office: [phone]

## 2022-01-08 ENCOUNTER — Other Ambulatory Visit: Payer: Self-pay

## 2022-01-08 DIAGNOSIS — Z7901 Long term (current) use of anticoagulants: Secondary | ICD-10-CM

## 2022-01-08 DIAGNOSIS — Z5181 Encounter for therapeutic drug level monitoring: Secondary | ICD-10-CM

## 2022-01-08 DIAGNOSIS — I2694 Multiple subsegmental pulmonary emboli without acute cor pulmonale: Secondary | ICD-10-CM

## 2022-01-08 DIAGNOSIS — I480 Paroxysmal atrial fibrillation: Secondary | ICD-10-CM

## 2022-01-08 MED ORDER — APIXABAN 5 MG PO TABS: 5.0000 mg | ORAL_TABLET | Freq: Two times a day (BID) | ORAL | 5 refills | Status: AC

## 2022-01-10 ENCOUNTER — Other Ambulatory Visit: Payer: Self-pay | Admitting: Obstetrics and Gynecology

## 2022-01-10 NOTE — Patient Outreach (Signed)
Medicaid Managed Care   Nurse Care Manager Note  01/10/2022 Name:  Kevin Orozco MRN:  [id number] DOB:  [birth year]  Kevin Orozco is an 66 y.o. year old male who is a primary Orozco of Kevin Chou, NP.  The [facility] Managed Care Coordination team was consulted for assistance with:    chronic pain, tobacco use, COPD, HLD, anxiety, CHF, CAD  [name] was given information about Medicaid Managed Care Coordination team services today. Kevin Orozco agreed to services and verbal consent obtained.  Engaged with Orozco by telephone for follow up visit in response to provider referral for case management and/or care coordination services.   Assessments/Interventions:  Review of past medical history, allergies, medications, health status, including review of consultants reports, laboratory and other test data, was performed as part of comprehensive evaluation and provision of chronic care management services.  SDOH (Social Determinants of Health) assessments and interventions performed: SDOH Interventions    Flowsheet Row Orozco Outreach Telephone from 01/10/2022 in [place] Orozco Outreach Telephone from 09/26/2021 in [place] Orozco Outreach Telephone from 07/23/2021 in [place] Interventions     Food Insecurity Interventions -- Intervention Not Indicated --  Transportation Interventions -- Intervention Not Indicated --  Utilities Interventions Intervention Not Indicated -- --  Physical Activity Interventions Other (Comments)  [Orozco not physically able to engage in moderate to strenuous exercise d/t back pain-has appt 9/28 for evaluation] -- --  Social Connections Interventions -- -- Intervention Not Indicated      Care Plan  Allergies  Allergen Reactions   Etomidate Anxiety   Tape Other (See Comments)    SKIN WILL TEAR EASILY!!!!     Medications Reviewed Today     Reviewed by Gayla Medicus, RN (Registered Nurse) on 01/10/22 at [place] List Status: <None>   Medication Order Taking? Sig Documenting Provider Last Dose Status Informant  apixaban (ELIQUIS) 5 MG TABS tablet [id number]  Take 1 tablet (5 mg total) by mouth 2 (two) times daily. Custovic, Sabina, DO  Active   clonazePAM (KLONOPIN) 0.5 MG tablet [id number] No Take 0.5-1 mg by mouth See admin instructions. 05.mg bid and 1mg  every evening as needed for anxiety. [provider] Taking Active Pharmacy Records           Med Note (DODD, AMBER Carlean Jews   Fri Jun 29, 2021 10:21 AM)    dronedarone (MULTAQ) 400 MG tablet [id number] No Take 1 tablet (400 mg total) by mouth 2 (two) times daily with a meal. [name], [name], MD Taking Active   finasteride (PROSCAR) 5 MG tablet [id number] No Take 5 mg by mouth daily. [provider] Taking Active   furosemide (LASIX) 40 MG tablet [id number] No TAKE ONE TABLET BY MOUTH TWICE DAILY Cantwell, Celeste C, PA-C Taking Active   gabapentin (NEURONTIN) 300 MG capsule [id number] No Take 300 mg by mouth at bedtime. [provider] Taking Active Pharmacy Records           Med Note (DODD, AMBER Carlean Jews   Fri Jun 29, 2021 10:25 AM)    ipratropium-albuterol (DUONEB) 0.5-2.5 (3) MG/3ML SOLN [id number] No Take 3 mLs by nebulization 4 (four) times daily as needed (for shortness of breath or wheezing). [provider] Taking Active Pharmacy Records           Med Note (DODD, AMBER Carlean Jews   Fri Jun 29, 2021 10:25 AM)    isosorbide  mononitrate (IMDUR) 30 MG 24 hr tablet [id number] No Take 1 tablet (30 mg total) by mouth daily. Kevin Hanlon, MD Taking Expired 07/26/21 2359   metoprolol (TOPROL-XL) 200 MG 24 hr tablet [id number] No Take 100 mg by mouth daily. [provider] Taking Active   morphine (MS CONTIN) 30 MG 12 hr tablet [id number] No Take 30 mg by mouth every 12 (twelve) hours. [provider] Taking Active  Pharmacy Records           Med Note (DODD, AMBER L   Fri Jun 29, 2021 10:30 AM)    MOVANTIK 12.5 MG TABS tablet [id number] No Take 12.5 mg by mouth every morning. [provider] Taking Active Pharmacy Records           Med Note (DODD, AMBER L   Fri Jun 29, 2021 10:24 AM)    Naloxone HCl (KLOXXADO) 8 MG/0.1ML LIQD [id number] No Place into the nose as needed (for accidental overdose). [provider] Taking Active Pharmacy Records           Med Note (DODD, AMBER L   Fri Jun 29, 2021 10:30 AM)    nitroGLYCERIN (NITROSTAT) 0.4 MG SL tablet [id number] No Place under the tongue every 5 (five) minutes as needed for chest pain. [provider] Taking Active Pharmacy Records  oxyCODONE-acetaminophen (PERCOCET) 10-325 MG tablet [id number] No Take 1 tablet by mouth every 4 (four) hours as needed for pain. [provider] Taking Active Pharmacy Records           Med Note (DODD, AMBER Carlean Jews   Fri Jun 29, 2021 10:30 AM)    pantoprazole (PROTONIX) 40 MG tablet [id number] No Take 40 mg by mouth daily before breakfast. [provider] Taking Active Pharmacy Records           Med Note (DODD, AMBER L   Fri Jun 29, 2021 10:22 AM)    predniSONE (DELTASONE) 10 MG tablet [id number] No Take 10 mg by mouth daily with breakfast. Continuous. [provider] Taking Active Pharmacy Records           Med Note (DODD, AMBER Carlean Jews   Fri Jun 29, 2021 10:24 AM)    ranolazine (RANEXA) 500 MG 12 hr tablet [id number] No TAKE ONE TABLET BY MOUTH TWICE DAILY Cantwell, Celeste C, PA-C Taking Active   rivaroxaban (XARELTO) 2.5 MG TABS tablet [id number] No  [provider] Taking Active   rosuvastatin (CRESTOR) 10 MG tablet [id number] No Take 1 tablet (10 mg total) by mouth daily. Kevin Mormon, MD Taking Expired 06/29/21 2359 Pharmacy Records           Med Note (DODD, AMBER L   Fri Jun 29, 2021 10:22 AM)    sertraline (ZOLOFT) 50 MG tablet [id number] No Take 50 mg by mouth daily.  [provider] Taking Active Pharmacy Records           Med Note Kevin Orozco, COURTNEY A   Thu Jun 21, 2021  1:44 PM) 04/30/21 #90 x 90  tamsulosin (FLOMAX) 0.4 MG CAPS capsule [id number] No Take 0.4 mg by mouth at bedtime. [provider] Taking Active   traZODone (DESYREL) 100 MG tablet [id number] No Take 100 mg by mouth at bedtime. [provider] Taking Active   TRELEGY ELLIPTA 200-62.5-25 MCG/INH AEPB [id number] No Inhale 1 puff into the lungs daily. [provider] Taking Active Pharmacy Records           Med Note (DODD, AMBER L  Fri Jun 29, 2021 10:21 AM)    Med List Note Lisette Abu 01/18/14 2041): PCP Delia Chimes           Orozco Active Problem List   Diagnosis Date Noted   Chronic diastolic (congestive) heart failure ([place]) 12/11/2021   Paroxysmal atrial fibrillation ([place]) 12/11/2021   Anxiety 06/20/2021   Acute CHF (congestive heart failure) ([place]) 02/18/2020   Hypokalemia 02/18/2020   Atrial fibrillation with RVR ([place]) 02/18/2020   Acute on chronic systolic CHF (congestive heart failure) ([place]) 02/18/2020   Chest pain 07/09/2019   Claudication ([place]) 09/07/2015   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[place])    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[place])    Acute on chronic heart failure with preserved ejection fraction (HCC)    Tobacco abuse    Polysubstance abuse ([place])    Marijuana abuse    Chronic obstructive pulmonary disease ([place])    Acute respiratory failure with hypoxia and hypercapnia (HCC)    Hepatitis, viral    Acute respiratory failure with hypoxia ([place]) 02/22/2015   Cocaine abuse ([place]) 04/24/2012   Coronary vasospasm ([place]) 04/24/2012   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   Transaminitis 04/20/2012   CAD (coronary artery disease) 04/19/2012   Hyperlipidemia 04/19/2012   Essential hypertension    Alcohol  abuse    Conditions to be addressed/monitored per PCP order: chronic pain, tobacco use, COPD, HLD, anxiety, CHF, CAD  Care Plan : RN Care Manager Plan of Care  Updates made by Gayla Medicus, RN since 01/10/2022 12:00 AM     Problem: Health Promotion or Disease Self-Management (General Plan of Care)      Long-Range Goal: Chronic Disease Managment   Start Date: 07/23/2021  Expected End Date: 04/11/2022  Recent Progress: Not on track  Priority: High  Note:   Current Barriers:  Chronic Disease Management support and education needs related to chronic pain, tobacco use, COPD, HLD, anxiety, CHF 01/10/22:  Orozco c/o back pain today, rates as a 10-has appt with provider 01/17/22.  Taking pain meds as needed with minimal relief.  Continues to smoke 3/4 ppd.  Taken off of BP medication  RNCM Clinical Goal(s):  Orozco will verbalize understanding of plan for management of  chronic pain, tobacco use, COPD, HLD, anxiety, CHF as evidenced by Orozco report take all medications exactly as prescribed and will call provider for medication related questions as evidenced by Orozco report demonstrate understanding of rationale for each prescribed medication as evidenced by Orozco report attend all scheduled medical appointments as evidenced by Orozco report continue to work with RN Care Manager to address care management and care coordination needs related to  Tobacco Use and chronic pain  as evidenced by adherence to CM Team Scheduled appointments through collaboration with RN Care manager, provider, and care team.   Interventions: Inter-disciplinary care team collaboration (see longitudinal plan of care) Evaluation of current treatment plan related to  self management and Orozco's adherence to plan as established by provider Pain Interventions:  (Status:  New goal.) Long Term Goal Pain assessment performed Medications reviewed Reviewed provider established plan for pain management Discussed  importance of adherence to all scheduled medical appointments Counseled on the importance of reporting any/all new or changed pain symptoms or management strategies to pain management provider Advised Orozco to report to care team affect of pain on daily activities Assessed social  determinant of health barriers Orozco given 1-800 QUITNOW number   Orozco Goals/Self-Care Activities: Take all medications as prescribed Attend all scheduled provider appointments Call pharmacy for medication refills 3-7 days in advance of running out of medications Perform all self care activities independently  Perform IADL's (shopping, preparing meals, housekeeping, managing finances) independently Call provider office for new concerns or questions   Follow Up Plan:  The Orozco has been provided with contact information for the care management team and has been advised to call with any health related questions or concerns.  The care management team will reach out to the Orozco again over the next 30 business  days.   Long-Range Goal: Establish Plan of Care for Chronic Disease Management Needs   Priority: High  Note:   Timeframe:  Long-Range Goal Priority:  High Start Date:      07/23/21                       Expected End Date:  ongoing                     Follow Up Date 02/13/22   - schedule appointment for flu shot - schedule appointment for vaccines needed due to my age or health - schedule recommended health tests - schedule and keep appointment for annual check-up    Why is this important?   Screening tests can find diseases early when they are easier to treat.  Your doctor or nurse will talk with you about which tests are important for you.  Getting shots for common diseases like the flu and shingles will help prevent them.  01/10/22:  Orozco seen and evaluated by [facility] 12/11/21.  Has appt with [facility] 01/17/22.   Follow Up:  Orozco agrees to Care Plan and Follow-up.  Plan: The  Managed Medicaid care management team will reach out to the Orozco again over the next 30 business  days. and The  Orozco has been provided with contact information for the Managed Medicaid care management team and has been advised to call with any health related questions or concerns.  Date/time of next scheduled RN care management/care coordination outreach: 02/13/22 at 1030.

## 2022-01-10 NOTE — Patient Instructions (Signed)
Visit Information  [name] was given information about Medicaid Managed Care team care coordination services as a part of their [facility] Medicaid benefit. Kevin Orozco verbally consented to engagement with the [facility] Managed Care team.   If you are experiencing a medical emergency, please call 911 or report to your local emergency department or urgent care.   If you have a non-emergency medical problem during routine business hours, please contact your provider's office and ask to speak with a nurse.   For questions related to your [facility] health plan, please call: [phone] or go here:[email or web address]  If you would like to schedule transportation through your [facility] plan, please call the following number at least 2 days in advance of your appointment: [phone].  You can also use the MTM portal or MTM mobile app to manage your rides. For the portal, please go to [email or web address].  Call the [employer] at [phone], at any time, 24 hours a day, 7 days a week. If you are in danger or need immediate medical attention call 911.  If you would like help to quit smoking, call 1-800-QUIT-NOW (307)643-6839) OR Espaol: 1-855-Djelo-Ya (4-132-440-1027) o para ms informacin haga clic aqu or Text READY to 200-400 to register via text  Kevin Orozco - following are the goals we discussed in your visit today:   Goals Addressed    Timeframe:  Long-Range Goal Priority:  High Start Date:      07/23/21                       Expected End Date:  ongoing                     Follow Up Date 02/13/22   - schedule appointment for flu shot - schedule appointment for vaccines needed due to my age or health - schedule recommended health tests - schedule and keep appointment for annual check-up    Why is this important?   Screening tests can find diseases early when they are easier to treat.  Your doctor or nurse will talk with you about  which tests are important for you.  Getting shots for common diseases like the flu and shingles will help prevent them.  01/10/22:  Patient seen and evaluated by [facility] 12/11/21.  Has appt with [facility] 01/17/22.  Patient verbalizes understanding of instructions and care plan provided today and agrees to view in [place]. Active MyChart status and patient understanding of how to access instructions and care plan via MyChart confirmed with patient.     The Managed Medicaid care management team will reach out to the patient again over the next 30 business  days.  The  Patient has been provided with contact information for the Managed Medicaid care management team and has been advised to call with any health related questions or concerns.   Kevin Raider RN, BSN [place] Management Coordinator - Managed Medicaid High Risk [phone]  Following is a copy of your plan of care:  Care Plan : [place] of Care  Updates made by Gayla Medicus, RN since 01/10/2022 12:00 AM     Problem: Health Promotion or Disease Self-Management (General Plan of Care)      Long-Range Goal: Chronic Disease Managment   Start Date: 07/23/2021  Expected End Date: 04/11/2022  Recent Progress: Not on track  Priority: High  Note:   Current Barriers:  Chronic Disease  Management support and education needs related to chronic pain, tobacco use, COPD, HLD, anxiety, CHF 01/10/22:  Patient c/o back pain today, rates as a 10-has appt with provider 01/17/22.  Taking pain meds as needed with minimal relief.  Continues to smoke 3/4 ppd.  Taken off of BP medication  RNCM Clinical Goal(s):  Patient will verbalize understanding of plan for management of  chronic pain, tobacco use, COPD, HLD, anxiety, CHF as evidenced by patient report take all medications exactly as prescribed and will call provider for medication related questions as evidenced by patient report demonstrate  understanding of rationale for each prescribed medication as evidenced by patient report attend all scheduled medical appointments as evidenced by patient report continue to work with RN Care Manager to address care management and care coordination needs related to  Tobacco Use and chronic pain  as evidenced by adherence to CM Team Scheduled appointments through collaboration with RN Care manager, provider, and care team.   Interventions: Inter-disciplinary care team collaboration (see longitudinal plan of care) Evaluation of current treatment plan related to  self management and patient's adherence to plan as established by provider Pain Interventions:  (Status:  New goal.) Long Term Goal Pain assessment performed Medications reviewed Reviewed provider established plan for pain management Discussed importance of adherence to all scheduled medical appointments Counseled on the importance of reporting any/all new or changed pain symptoms or management strategies to pain management provider Advised patient to report to care team affect of pain on daily activities Assessed social determinant of health barriers Patient given 1-800 QUITNOW number   Patient Goals/Self-Care Activities: Take all medications as prescribed Attend all scheduled provider appointments Call pharmacy for medication refills 3-7 days in advance of running out of medications Perform all self care activities independently  Perform IADL's (shopping, preparing meals, housekeeping, managing finances) independently Call provider office for new concerns or questions   Follow Up Plan:  The patient has been provided with contact information for the care management team and has been advised to call with any health related questions or concerns.  The care management team will reach out to the patient again over the next 30 business  days.

## 2022-01-11 ENCOUNTER — Other Ambulatory Visit: Payer: Self-pay

## 2022-01-11 MED ORDER — FUROSEMIDE 40 MG PO TABS: 40.0000 mg | ORAL_TABLET | Freq: Two times a day (BID) | ORAL | 2 refills | Status: AC

## 2022-02-12 ENCOUNTER — Emergency Department (HOSPITAL_COMMUNITY): Payer: Medicaid Other

## 2022-02-12 ENCOUNTER — Encounter (HOSPITAL_COMMUNITY): Payer: Self-pay | Admitting: Family Medicine

## 2022-02-12 ENCOUNTER — Other Ambulatory Visit: Payer: Self-pay

## 2022-02-12 ENCOUNTER — Inpatient Hospital Stay (HOSPITAL_COMMUNITY)
Admission: EM | Admit: 2022-02-12 | Discharge: 2022-02-15 | DRG: 308 | Disposition: A | Discharge status: Home / Self Care | Payer: Medicaid Other | Attending: Internal Medicine | Admitting: Internal Medicine

## 2022-02-12 DIAGNOSIS — F419 Anxiety disorder, unspecified: Secondary | ICD-10-CM | POA: Diagnosis present

## 2022-02-12 DIAGNOSIS — I959 Hypotension, unspecified: Secondary | ICD-10-CM | POA: Diagnosis present

## 2022-02-12 DIAGNOSIS — I5033 Acute on chronic diastolic (congestive) heart failure: Secondary | ICD-10-CM | POA: Diagnosis not present

## 2022-02-12 DIAGNOSIS — Z8249 Family history of ischemic heart disease and other diseases of the circulatory system: Secondary | ICD-10-CM

## 2022-02-12 DIAGNOSIS — Z79899 Other long term (current) drug therapy: Secondary | ICD-10-CM

## 2022-02-12 DIAGNOSIS — F1010 Alcohol abuse, uncomplicated: Secondary | ICD-10-CM | POA: Diagnosis present

## 2022-02-12 DIAGNOSIS — Z7901 Long term (current) use of anticoagulants: Secondary | ICD-10-CM

## 2022-02-12 DIAGNOSIS — G8929 Other chronic pain: Secondary | ICD-10-CM | POA: Diagnosis present

## 2022-02-12 DIAGNOSIS — E1151 Type 2 diabetes mellitus with diabetic peripheral angiopathy without gangrene: Secondary | ICD-10-CM | POA: Diagnosis present

## 2022-02-12 DIAGNOSIS — I25118 Atherosclerotic heart disease of native coronary artery with other forms of angina pectoris: Secondary | ICD-10-CM | POA: Diagnosis not present

## 2022-02-12 DIAGNOSIS — Z7951 Long term (current) use of inhaled steroids: Secondary | ICD-10-CM

## 2022-02-12 DIAGNOSIS — I4891 Unspecified atrial fibrillation: Secondary | ICD-10-CM | POA: Diagnosis present

## 2022-02-12 DIAGNOSIS — I2510 Atherosclerotic heart disease of native coronary artery without angina pectoris: Secondary | ICD-10-CM | POA: Diagnosis present

## 2022-02-12 DIAGNOSIS — F1910 Other psychoactive substance abuse, uncomplicated: Secondary | ICD-10-CM | POA: Diagnosis present

## 2022-02-12 DIAGNOSIS — Z91048 Other nonmedicinal substance allergy status: Secondary | ICD-10-CM

## 2022-02-12 DIAGNOSIS — Z888 Allergy status to other drugs, medicaments and biological substances status: Secondary | ICD-10-CM

## 2022-02-12 DIAGNOSIS — J449 Chronic obstructive pulmonary disease, unspecified: Secondary | ICD-10-CM | POA: Diagnosis present

## 2022-02-12 DIAGNOSIS — N179 Acute kidney failure, unspecified: Secondary | ICD-10-CM | POA: Diagnosis not present

## 2022-02-12 DIAGNOSIS — I493 Ventricular premature depolarization: Secondary | ICD-10-CM | POA: Diagnosis present

## 2022-02-12 DIAGNOSIS — R9431 Abnormal electrocardiogram [ECG] [EKG]: Secondary | ICD-10-CM | POA: Diagnosis present

## 2022-02-12 DIAGNOSIS — Z833 Family history of diabetes mellitus: Secondary | ICD-10-CM

## 2022-02-12 DIAGNOSIS — R002 Palpitations: Secondary | ICD-10-CM | POA: Diagnosis present

## 2022-02-12 DIAGNOSIS — I480 Paroxysmal atrial fibrillation: Principal | ICD-10-CM | POA: Diagnosis present

## 2022-02-12 DIAGNOSIS — E7800 Pure hypercholesterolemia, unspecified: Secondary | ICD-10-CM | POA: Diagnosis present

## 2022-02-12 DIAGNOSIS — F1120 Opioid dependence, uncomplicated: Secondary | ICD-10-CM | POA: Diagnosis present

## 2022-02-12 DIAGNOSIS — F1410 Cocaine abuse, uncomplicated: Secondary | ICD-10-CM | POA: Diagnosis present

## 2022-02-12 DIAGNOSIS — F17210 Nicotine dependence, cigarettes, uncomplicated: Secondary | ICD-10-CM | POA: Diagnosis present

## 2022-02-12 DIAGNOSIS — I110 Hypertensive heart disease with heart failure: Secondary | ICD-10-CM | POA: Diagnosis present

## 2022-02-12 DIAGNOSIS — Z7952 Long term (current) use of systemic steroids: Secondary | ICD-10-CM

## 2022-02-12 DIAGNOSIS — E876 Hypokalemia: Secondary | ICD-10-CM | POA: Diagnosis present

## 2022-02-12 LAB — PROTIME-INR
INR: 1.4 — ABNORMAL HIGH (ref 0.8–1.2)
Prothrombin Time: 16.7 seconds — ABNORMAL HIGH (ref 11.4–15.2)

## 2022-02-12 LAB — COMPREHENSIVE METABOLIC PANEL
ALT: 20 U/L (ref 0–44)
ALT: 20 U/L (ref 0–44)
AST: 51 U/L — ABNORMAL HIGH (ref 15–41)
AST: 33 U/L (ref 15–41)
Albumin: 3.1 g/dL — ABNORMAL LOW (ref 3.5–5.0)
Albumin: 3 g/dL — ABNORMAL LOW (ref 3.5–5.0)
Alkaline Phosphatase: 46 U/L (ref 38–126)
Alkaline Phosphatase: 41 U/L (ref 38–126)
Anion gap: 11 (ref 5–15)
Anion gap: 8 (ref 5–15)
BUN: 12 mg/dL (ref 8–23)
BUN: 12 mg/dL (ref 8–23)
CO2: 24 mmol/L (ref 22–32)
CO2: 26 mmol/L (ref 22–32)
Calcium: 8.1 mg/dL — ABNORMAL LOW (ref 8.9–10.3)
Calcium: 8 mg/dL — ABNORMAL LOW (ref 8.9–10.3)
Chloride: 104 mmol/L (ref 98–111)
Chloride: 104 mmol/L (ref 98–111)
Creatinine, Ser: 1.54 mg/dL — ABNORMAL HIGH (ref 0.61–1.24)
Creatinine, Ser: 1.39 mg/dL — ABNORMAL HIGH (ref 0.61–1.24)
GFR, Estimated: 50 mL/min — ABNORMAL LOW (ref >60)
GFR, Estimated: 57 mL/min — ABNORMAL LOW (ref >60)
Glucose, Bld: 136 mg/dL — ABNORMAL HIGH (ref 70–99)
Glucose, Bld: 99 mg/dL (ref 70–99)
Potassium: 3 mmol/L — ABNORMAL LOW (ref 3.5–5.1)
Potassium: 2.8 mmol/L — ABNORMAL LOW (ref 3.5–5.1)
Sodium: 139 mmol/L (ref 135–145)
Sodium: 138 mmol/L (ref 135–145)
Total Bilirubin: 1.3 mg/dL — ABNORMAL HIGH (ref 0.3–1.2)
Total Bilirubin: 0.7 mg/dL (ref 0.3–1.2)
Total Protein: 6.7 g/dL (ref 6.5–8.1)
Total Protein: 6.6 g/dL (ref 6.5–8.1)

## 2022-02-12 LAB — CBC
HCT: 40.5 % (ref 39.0–52.0)
Hemoglobin: 13.7 g/dL (ref 13.0–17.0)
MCH: 32.7 pg (ref 26.0–34.0)
MCHC: 33.8 g/dL (ref 30.0–36.0)
MCV: 96.7 fL (ref 80.0–100.0)
Platelets: 226 10*3/uL (ref 150–400)
RBC: 4.19 MIL/uL — ABNORMAL LOW (ref 4.22–5.81)
RDW: 14.3 % (ref 11.5–15.5)
WBC: 7.1 10*3/uL (ref 4.0–10.5)
nRBC: 0 % (ref 0.0–0.2)

## 2022-02-12 LAB — MAGNESIUM: Magnesium: 1.5 mg/dL — ABNORMAL LOW (ref 1.7–2.4)

## 2022-02-12 MED ORDER — LUBIPROSTONE 8 MCG PO CAPS
8.0000 ug | ORAL_CAPSULE | Freq: Every day | ORAL | Status: DC
  Administered 2022-02-13 – 2022-02-15 (×3): 8 ug via ORAL
  Filled 2022-02-12 (×3): qty 1

## 2022-02-12 MED ORDER — ACETAMINOPHEN 325 MG PO TABS: 650.0000 mg | ORAL_TABLET | Freq: Four times a day (QID) | ORAL | Status: DC | PRN

## 2022-02-12 MED ORDER — MORPHINE SULFATE ER 15 MG PO TBCR
15.0000 mg | EXTENDED_RELEASE_TABLET | Freq: Two times a day (BID) | ORAL | Status: DC
  Administered 2022-02-13 – 2022-02-15 (×4): 15 mg via ORAL
  Filled 2022-02-12 (×4): qty 1

## 2022-02-12 MED ORDER — APIXABAN 5 MG PO TABS
5.0000 mg | ORAL_TABLET | Freq: Two times a day (BID) | ORAL | Status: DC
  Administered 2022-02-12: 5 mg via ORAL
  Filled 2022-02-12: qty 1

## 2022-02-12 MED ORDER — TRAZODONE HCL 100 MG PO TABS
100.0000 mg | ORAL_TABLET | Freq: Every evening | ORAL | Status: DC | PRN
  Administered 2022-02-14: 100 mg via ORAL
  Filled 2022-02-12: qty 1

## 2022-02-12 MED ORDER — FENTANYL CITRATE PF 50 MCG/ML IJ SOSY
25.0000 ug | PREFILLED_SYRINGE | Freq: Once | INTRAMUSCULAR | Status: AC
  Administered 2022-02-12: 25 ug via INTRAVENOUS
  Filled 2022-02-12: qty 1

## 2022-02-12 MED ORDER — FINASTERIDE 5 MG PO TABS
5.0000 mg | ORAL_TABLET | Freq: Every day | ORAL | Status: DC
  Administered 2022-02-13 – 2022-02-15 (×3): 5 mg via ORAL
  Filled 2022-02-12 (×3): qty 1

## 2022-02-12 MED ORDER — OXYCODONE-ACETAMINOPHEN 10-325 MG PO TABS: 1.0000 | ORAL_TABLET | ORAL | Status: DC | PRN

## 2022-02-12 MED ORDER — METOPROLOL SUCCINATE ER 100 MG PO TB24
100.0000 mg | ORAL_TABLET | Freq: Every day | ORAL | Status: DC
  Administered 2022-02-13 – 2022-02-15 (×3): 100 mg via ORAL
  Filled 2022-02-12: qty 4
  Filled 2022-02-12 (×2): qty 1

## 2022-02-12 MED ORDER — RANOLAZINE ER 500 MG PO TB12
500.0000 mg | ORAL_TABLET | Freq: Two times a day (BID) | ORAL | Status: DC
  Administered 2022-02-12 – 2022-02-15 (×6): 500 mg via ORAL
  Filled 2022-02-12 (×6): qty 1

## 2022-02-12 MED ORDER — ROSUVASTATIN CALCIUM 5 MG PO TABS
10.0000 mg | ORAL_TABLET | Freq: Every day | ORAL | Status: DC
  Administered 2022-02-13 – 2022-02-15 (×3): 10 mg via ORAL
  Filled 2022-02-12 (×3): qty 2

## 2022-02-12 MED ORDER — LACTATED RINGERS IV BOLUS
500.0000 mL | Freq: Once | INTRAVENOUS | Status: AC
  Administered 2022-02-12: 500 mL via INTRAVENOUS

## 2022-02-12 MED ORDER — ACETAMINOPHEN 650 MG RE SUPP: 650.0000 mg | Freq: Four times a day (QID) | RECTAL | Status: DC | PRN

## 2022-02-12 MED ORDER — POTASSIUM CHLORIDE CRYS ER 20 MEQ PO TBCR
40.0000 meq | EXTENDED_RELEASE_TABLET | Freq: Once | ORAL | Status: AC
  Administered 2022-02-12: 40 meq via ORAL
  Filled 2022-02-12: qty 2

## 2022-02-12 MED ORDER — PREDNISONE 10 MG PO TABS
10.0000 mg | ORAL_TABLET | Freq: Every day | ORAL | Status: DC
  Administered 2022-02-13 – 2022-02-15 (×3): 10 mg via ORAL
  Filled 2022-02-12 (×2): qty 1
  Filled 2022-02-12: qty 2
  Filled 2022-02-12: qty 1

## 2022-02-12 MED ORDER — OXYCODONE-ACETAMINOPHEN 5-325 MG PO TABS
1.0000 | ORAL_TABLET | ORAL | Status: DC | PRN
  Administered 2022-02-13 – 2022-02-15 (×8): 1 via ORAL
  Filled 2022-02-12 (×9): qty 1

## 2022-02-12 MED ORDER — FENTANYL CITRATE PF 50 MCG/ML IJ SOSY
50.0000 ug | PREFILLED_SYRINGE | Freq: Once | INTRAMUSCULAR | Status: AC
  Administered 2022-02-12: 50 ug via INTRAVENOUS
  Filled 2022-02-12: qty 1

## 2022-02-12 MED ORDER — PANTOPRAZOLE SODIUM 40 MG PO TBEC
40.0000 mg | DELAYED_RELEASE_TABLET | Freq: Every day | ORAL | Status: DC
  Administered 2022-02-13 – 2022-02-15 (×3): 40 mg via ORAL
  Filled 2022-02-12 (×5): qty 1

## 2022-02-12 MED ORDER — CLONAZEPAM 0.5 MG PO TABS
0.5000 mg | ORAL_TABLET | Freq: Four times a day (QID) | ORAL | Status: DC | PRN
  Administered 2022-02-14: 1 mg via ORAL
  Filled 2022-02-12: qty 2

## 2022-02-12 MED ORDER — ISOSORBIDE MONONITRATE ER 30 MG PO TB24
30.0000 mg | ORAL_TABLET | Freq: Every day | ORAL | Status: DC
  Administered 2022-02-13 – 2022-02-15 (×3): 30 mg via ORAL
  Filled 2022-02-12 (×3): qty 1

## 2022-02-12 MED ORDER — POTASSIUM CHLORIDE 10 MEQ/100ML IV SOLN
10.0000 meq | INTRAVENOUS | Status: AC
  Administered 2022-02-12 – 2022-02-13 (×4): 10 meq via INTRAVENOUS
  Filled 2022-02-12 (×4): qty 100

## 2022-02-12 MED ORDER — UMECLIDINIUM BROMIDE 62.5 MCG/ACT IN AEPB
1.0000 | INHALATION_SPRAY | Freq: Every day | RESPIRATORY_TRACT | Status: DC
  Administered 2022-02-13 – 2022-02-15 (×3): 1 via RESPIRATORY_TRACT
  Filled 2022-02-12: qty 7

## 2022-02-12 MED ORDER — DILTIAZEM HCL-DEXTROSE 125-5 MG/125ML-% IV SOLN (PREMIX)
5.0000 mg/h | INTRAVENOUS | Status: DC
  Administered 2022-02-12: 5 mg/h via INTRAVENOUS
  Filled 2022-02-12: qty 125

## 2022-02-12 MED ORDER — IPRATROPIUM-ALBUTEROL 0.5-2.5 (3) MG/3ML IN SOLN: 3.0000 mL | Freq: Four times a day (QID) | RESPIRATORY_TRACT | Status: DC | PRN

## 2022-02-12 MED ORDER — NALOXEGOL OXALATE 12.5 MG PO TABS
12.5000 mg | ORAL_TABLET | Freq: Every morning | ORAL | Status: DC
  Administered 2022-02-13 – 2022-02-15 (×3): 12.5 mg via ORAL
  Filled 2022-02-12 (×3): qty 1

## 2022-02-12 MED ORDER — GABAPENTIN 300 MG PO CAPS
300.0000 mg | ORAL_CAPSULE | Freq: Every day | ORAL | Status: DC
  Administered 2022-02-12 – 2022-02-14 (×3): 300 mg via ORAL
  Filled 2022-02-12 (×3): qty 1

## 2022-02-12 MED ORDER — APIXABAN 5 MG PO TABS: 5.0000 mg | ORAL_TABLET | Freq: Once | ORAL | Status: DC

## 2022-02-12 MED ORDER — DILTIAZEM HCL ER COATED BEADS 120 MG PO CP24
120.0000 mg | ORAL_CAPSULE | Freq: Every day | ORAL | Status: DC
  Administered 2022-02-13: 120 mg via ORAL
  Filled 2022-02-12: qty 1

## 2022-02-12 MED ORDER — KETAMINE HCL 50 MG/5ML IJ SOSY
0.5000 mg/kg | PREFILLED_SYRINGE | Freq: Once | INTRAMUSCULAR | Status: AC
  Administered 2022-02-12: 47 mg via INTRAVENOUS
  Filled 2022-02-12: qty 5

## 2022-02-12 MED ORDER — DILTIAZEM HCL 25 MG/5ML IV SOLN
10.0000 mg | Freq: Once | INTRAVENOUS | Status: DC
  Filled 2022-02-12: qty 5

## 2022-02-12 MED ORDER — SERTRALINE HCL 50 MG PO TABS
50.0000 mg | ORAL_TABLET | Freq: Every day | ORAL | Status: DC
  Administered 2022-02-13 – 2022-02-15 (×3): 50 mg via ORAL
  Filled 2022-02-12 (×3): qty 1

## 2022-02-12 MED ORDER — APIXABAN 5 MG PO TABS
5.0000 mg | ORAL_TABLET | Freq: Two times a day (BID) | ORAL | Status: DC
  Administered 2022-02-13 – 2022-02-15 (×5): 5 mg via ORAL
  Filled 2022-02-12 (×5): qty 1

## 2022-02-12 MED ORDER — OXYCODONE HCL 5 MG PO TABS
5.0000 mg | ORAL_TABLET | ORAL | Status: DC | PRN
  Administered 2022-02-13 – 2022-02-15 (×6): 5 mg via ORAL
  Filled 2022-02-12 (×7): qty 1

## 2022-02-12 MED ORDER — SODIUM CHLORIDE 0.9% FLUSH
3.0000 mL | Freq: Two times a day (BID) | INTRAVENOUS | Status: DC
  Administered 2022-02-13 – 2022-02-15 (×4): 3 mL via INTRAVENOUS

## 2022-02-12 MED ORDER — TAMSULOSIN HCL 0.4 MG PO CAPS
0.4000 mg | ORAL_CAPSULE | Freq: Every day | ORAL | Status: DC
  Administered 2022-02-13 – 2022-02-15 (×3): 0.4 mg via ORAL
  Filled 2022-02-12 (×3): qty 1

## 2022-02-12 MED ORDER — MORPHINE SULFATE ER 30 MG PO TBCR: 30.0000 mg | EXTENDED_RELEASE_TABLET | Freq: Two times a day (BID) | ORAL | Status: DC

## 2022-02-12 MED ORDER — DILTIAZEM LOAD VIA INFUSION
15.0000 mg | Freq: Once | INTRAVENOUS | Status: AC
  Administered 2022-02-12: 15 mg via INTRAVENOUS
  Filled 2022-02-12: qty 15

## 2022-02-12 MED ORDER — FLUTICASONE FUROATE-VILANTEROL 200-25 MCG/ACT IN AEPB
1.0000 | INHALATION_SPRAY | Freq: Every day | RESPIRATORY_TRACT | Status: DC
  Administered 2022-02-13 – 2022-02-15 (×3): 1 via RESPIRATORY_TRACT
  Filled 2022-02-12: qty 28

## 2022-02-12 NOTE — ED Notes (Signed)
Informed consent signed for cardioversion.

## 2022-02-12 NOTE — Progress Notes (Signed)
Rt present for cardioversion.  Pt stable throughout.  Rt will cont to monitor.

## 2022-02-12 NOTE — H&P (Incomplete)
History and Physical    Kevin Orozco [id number] DOB: 1955/05/21 DOA: 02/12/2022  PCP: [name], NP   Patient coming from: Home   Chief Complaint: Rapid atrial fibrillation   HPI: Kevin Orozco is a pleasant 66 y.o. male with medical history significant for polysubstance abuse, CAD, atrial fibrillation on Eliquis, COPD, chronic pain, and anxiety who presents to the emergency department for evaluation of rapid atrial fibrillation.  Patient reported that he was having exertional dyspnea and palpitations yesterday that made him think he was back in atrial fibrillation.  He was seen at pain management clinic today where he was noted to be in rapid atrial fibrillation and referred to the ED.  Patient denies cough, fever, or chest pain.  States that he has not used alcohol or illicit substances in 4 years. He was given 20 mg IV diltiazem and 500 mL of IV fluids by EMS prior to arrival in the ED.   ED Course: Upon arrival to the ED, patient is found to be afebrile and saturating mid 90s on room air with systolic blood pressure of 96 and greater.  EKG features atrial fibrillation with PVC and QTc of 507 ms.  Head CT was negative for acute intracranial abnormality.  Chest x-ray notable for patchy right lower lobe airspace disease and suspected trace pleural effusion.  Chemistry panel notable for potassium 3.0 and creatinine 1.54.    Patient was cardioverted in the ED but returned to rapid atrial fibrillation within a few seconds.  He was treated with fentanyl, ketamine, 500 mL of LR, 50 mg IV diltiazem, and started on diltiazem infusion.  [facility] cardiology (Dr. Shellia Carwin) was consulted by the ED physician.  Review of Systems:  All other systems reviewed and apart from HPI, are negative.  Past Medical History:  Diagnosis Date   Acute respiratory distress 02/22/2015   Atrial fibrillation with RVR ([place]) 04/19/12   New onset; spontaneous conversion to NSR; Pradaxa anticoagulation   CAD  (coronary artery disease)    a. Cath 2008- nonobstructive, mod LAD, mild LCx & RCA disease b. Cardiolite 2010 normal c. 03/2012 abnormal Lexiscan Myoview attributed to cocaine d. 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 He has a 40.00 pack-year smoking history. He has never used smokeless tobacco. He reports that he does not currently use alcohol after a past usage of about 12.0 standard drinks of alcohol per week. He reports current drug use. Drugs: Cocaine, Morphine, and Oxycodone.  Allergies  Allergen Reactions   Etomidate Anxiety   Tape Other (See Comments)    SKIN WILL TEAR EASILY!!!!    Family History  Problem Relation Age of Onset   Heart attack Father 71   Diabetes Mother 76   Diabetes Sister  Heart attack Sister      Prior to Admission medications   Medication Sig Start Date End Date Taking? Authorizing Provider  apixaban (ELIQUIS) 5 MG TABS tablet Take 1 tablet (5 mg total) by mouth 2 (two) times daily. 01/08/22  Yes Custovic, Collene Mares, DO  clonazePAM (KLONOPIN) 0.5 MG tablet Take 0.5-1 mg by mouth 4 (four) times daily as needed for anxiety.   Yes [provider]  diltiazem (CARDIZEM CD) 120 MG 24 hr capsule Take 120 mg by mouth daily. 02/04/22  Yes [provider]   dronedarone (MULTAQ) 400 MG tablet Take 1 tablet (400 mg total) by mouth 2 (two) times daily with a meal. 06/22/21  Yes [name], [name], MD  finasteride (PROSCAR) 5 MG tablet Take 5 mg by mouth daily.   Yes [provider]  furosemide (LASIX) 40 MG tablet Take 1 tablet (40 mg total) by mouth 2 (two) times daily. 01/11/22  Yes Custovic, Collene Mares, DO  gabapentin (NEURONTIN) 300 MG capsule Take 300 mg by mouth at bedtime.   Yes [provider]  ipratropium-albuterol (DUONEB) 0.5-2.5 (3) MG/3ML SOLN Take 3 mLs by nebulization 4 (four) times daily as needed (for shortness of breath or wheezing).   Yes [provider]  isosorbide mononitrate (IMDUR) 30 MG 24 hr tablet Take 1 tablet (30 mg total) by mouth daily. 06/26/21 02/12/22 Yes [name], MD  lubiprostone (AMITIZA) 8 MCG capsule Take 8 mcg by mouth daily.   Yes [provider]  metoprolol (TOPROL-XL) 200 MG 24 hr tablet Take 100 mg by mouth daily.   Yes [provider]  morphine (MS CONTIN) 30 MG 12 hr tablet Take 30 mg by mouth every 12 (twelve) hours.   Yes [provider]  MOVANTIK 12.5 MG TABS tablet Take 12.5 mg by mouth every morning. 05/30/21  Yes [provider]  Naloxone HCl (KLOXXADO) 8 MG/0.1ML LIQD Place 8 mg into the nose as needed (for accidental overdose).   Yes [provider]  nitroGLYCERIN (NITROSTAT) 0.4 MG SL tablet Place under the tongue every 5 (five) minutes as needed for chest pain.   Yes [provider]  oxyCODONE-acetaminophen (PERCOCET) 10-325 MG tablet Take 1 tablet by mouth every 4 (four) hours as needed for pain. 05/28/21  Yes [provider]  pantoprazole (PROTONIX) 40 MG tablet Take 40 mg by mouth daily before breakfast.   Yes [provider]  predniSONE (DELTASONE) 10 MG tablet Take 10 mg by mouth daily with breakfast. Continuous.   Yes [provider]  ranolazine (RANEXA) 500 MG 12 hr tablet TAKE ONE TABLET BY  MOUTH TWICE DAILY Patient taking differently: Take 500 mg by mouth 2 (two) times daily. 08/21/21  Yes Cantwell, Celeste C, PA-C  rosuvastatin (CRESTOR) 10 MG tablet Take 1 tablet (10 mg total) by mouth daily. 01/08/21 02/12/22 Yes [name], [name], MD  sertraline (ZOLOFT) 50 MG tablet Take 50 mg by mouth daily.   Yes [provider]  tamsulosin (FLOMAX) 0.4 MG CAPS capsule Take 0.4 mg by mouth at bedtime.   Yes [provider]  traZODone (DESYREL) 100 MG tablet Take 100 mg by mouth at bedtime as needed for sleep. 12/10/21  Yes [provider]  TRELEGY ELLIPTA 200-62.5-25 MCG/INH AEPB Inhale 1 puff into the lungs daily. 01/31/21  Yes [provider]  VENTOLIN HFA 108 (90 Base) MCG/ACT inhaler Inhale 1-2 puffs into the lungs every 4 (four) hours as needed for wheezing or shortness of breath. 01/07/22  Yes [provider]    Physical Exam: Vitals:   02/12/22 2100 02/12/22 2115 02/12/22 2132 02/12/22 2135  BP: 118/81 114/88 102/74 94/71  Pulse: (!) 131 (!) 104 66   Resp: (!) 26 16 19 18   Temp:      TempSrc:      SpO2: 97% 95% 99%   Weight:      Height:        Constitutional: NAD, calm  Eyes: PERTLA, lids and conjunctivae normal ENMT: Mucous membranes are moist. Posterior pharynx clear of any exudate or lesions.   Neck: supple, no masses  Respiratory: no wheezing, no crackles. No accessory muscle use.  Cardiovascular: Rate ~100 and irregularly irregular. No extremity edema.   Abdomen: No distension, no tenderness, soft. Bowel sounds active.  Musculoskeletal: no clubbing / cyanosis. No joint deformity upper and lower extremities.   Skin: no significant rashes, lesions, ulcers. Warm, dry, well-perfused. Neurologic: CN 2-12 grossly intact. Moving all extremities. Alert and oriented.  Psychiatric: Pleasant. Cooperative.    Labs and Imaging on Admission: I have personally reviewed following labs and imaging studies  CBC: Recent Labs  Lab  02/12/22 1430  WBC 7.1  HGB 13.7  HCT 40.5  MCV 96.7  PLT 740   Basic Metabolic Panel: Recent Labs  Lab 02/12/22 1430 02/12/22 1721  NA 139 138  K 3.0* 2.8*  CL 104 104  CO2 24 26  GLUCOSE 136* 99  BUN 12 12  CREATININE 1.54* 1.39*  CALCIUM 8.1* 8.0*   GFR: Estimated Creatinine Clearance: 63.4 mL/min (A) (by C-G formula based on SCr of 1.39 mg/dL (H)). Liver Function Tests: Recent Labs  Lab 02/12/22 1430 02/12/22 1721  AST 51* 33  ALT 20 20  ALKPHOS 46 41  BILITOT 1.3* 0.7  PROT 6.7 6.6  ALBUMIN 3.1* 3.0*   No results for input(s): "LIPASE", "AMYLASE" in the last 168 hours. No results for input(s): "AMMONIA" in the last 168 hours. Coagulation Profile: Recent Labs  Lab 02/12/22 1430  INR 1.4*   Cardiac Enzymes: No results for input(s): "CKTOTAL", "CKMB", "CKMBINDEX", "TROPONINI" in the last 168 hours. BNP (last 3 results) No results for input(s): "PROBNP" in the last 8760 hours. HbA1C: No results for input(s): "HGBA1C" in the last 72 hours. CBG: No results for input(s): "GLUCAP" in the last 168 hours. Lipid Profile: No results for input(s): "CHOL", "HDL", "LDLCALC", "TRIG", "CHOLHDL", "LDLDIRECT" in the last 72 hours. Thyroid Function Tests: No results for input(s): "TSH", "T4TOTAL", "FREET4", "T3FREE", "THYROIDAB" in the last 72 hours. Anemia Panel: No results for input(s): "VITAMINB12", "FOLATE", "FERRITIN", "TIBC", "IRON", "RETICCTPCT" in the last 72 hours. Urine analysis: No results found for: "COLORURINE", "APPEARANCEUR", "LABSPEC", "PHURINE", "GLUCOSEU", "HGBUR", "BILIRUBINUR", "KETONESUR", "PROTEINUR", "UROBILINOGEN", "NITRITE", "LEUKOCYTESUR" Sepsis Labs: @LABRCNTIP (procalcitonin:4,lacticidven:4) )No results found for this or any previous visit (from the past 240 hour(s)).   Radiological Exams on Admission: CT Head Wo Contrast  Result Date: 02/12/2022 CLINICAL DATA:  Head trauma.  Coagulopathy EXAM: CT HEAD WITHOUT CONTRAST TECHNIQUE:  Contiguous axial images were obtained from the base of the skull through the vertex without intravenous contrast. RADIATION DOSE REDUCTION: This exam was performed according to the departmental dose-optimization program which includes automated exposure control, adjustment of the mA and/or kV according to patient size and/or use of iterative reconstruction technique. COMPARISON:  CT head 01/12/2006 FINDINGS: Brain: Generalized atrophy which has progressed. Negative for hydrocephalus. Patchy hypodensity in the basal ganglia bilaterally most consistent with chronic ischemia which has progressed. Negative for acute infarct, hemorrhage,  mass Vascular: Negative for hyperdense vessel Skull: Negative for skull fracture Sinuses/Orbits: Mild mucosal edema left maxillary sinus. Remaining sinuses clear. Bilateral cataract extraction Other: None IMPRESSION: No acute intracranial abnormality. Atrophy and chronic microvascular ischemia which has progressed since 2007. Electronically Signed   By: Franchot Gallo M.D.   On: 02/12/2022 18:13   DG Chest Port 1 View  Result Date: 02/12/2022 CLINICAL DATA:  Dyspnea EXAM: PORTABLE CHEST 1 VIEW COMPARISON:  Radiograph 06/19/2021 FINDINGS: Mildly enlarged cardiac silhouette, unchanged. Patchy right lower lung airspace opacities. Suspected trace right pleural effusion. No evidence of pneumothorax. No acute osseous abnormality. Thoracic spondylosis. IMPRESSION: Patchy right lower lung airspace disease and suspected trace pleural effusion. Findings could reflect developing pneumonia. Electronically Signed   By: Maurine Simmering M.D.   On: 02/12/2022 14:58    EKG: Independently reviewed. Atrial fibrillation, rate 106, PVC, QTc 507.   Assessment/Plan   1. Atrial fibrillation with RVR  - Cardioverted in ED but returned to rapid a fib within seconds  - IV diltiazem load given and infusion started in ED  - Continue IV diltiazem with titration, continue metoprolol and oral diltiazem,  continue Eliquis, optimize electrolytes, follow-up cardiology recommendations   2. AKI  - SCr is 1.54 on admission, up from apparent baseline of <1  - Likely acute prerenal injury in setting of rapid a fib  - Given 500 mL IVF with EMS and another 500 mL in ED  - Hold diuretic initially, renally-dose medications, monitor   3. Hypokalemia  - Replacing potassium, mag level pending  - Repeat chem panel in am   4. COPD  - Not in exacerbation on admission  - Continue LAMA-LABA-ICS, daily low-dose prednisone, and as-needed SABA   5. Chronic pain  - Prescription database reviewed  - Plan to continue home regimen but will decrease morphine dose for now in light of decreased GFR    6. Anxiety  - Continue Zoloft and as-needed Klonopin   7. CAD  - No anginal complaints  - Continue metoprolol, Crestor, Ranexa, Imdur; not on antiplatelets but on Eliquis    8. Prolonged QT interval  - QTc is 507 ms in ED  - Replace potassium, check mag level, avoid QT-prolonging medications     DVT prophylaxis: Eliquis  Code Status: Full Level of Care: Level of care: Progressive Family Communication: none present  Disposition Plan:  Patient is from: home  Anticipated d/c is to: Home  Anticipated d/c date is: Possibly as early as 02/13/22  Patient currently: Pending cardiology consultation, rate-control and conversion to [place] called: Cardiology  Admission status: Observation     Vianne Bulls, MD [facility]  02/12/2022, 9:43 PM

## 2022-02-12 NOTE — ED Provider Notes (Signed)
[facility] EMERGENCY DEPARTMENT Provider Note   CSN: [id number] Arrival date & time: 02/12/22  1414     History {Add pertinent medical, surgical, social history, OB history to HPI:1} Chief Complaint  Patient presents with   AFIB RVR     Kevin Orozco is a 66 y.o. male.  HPI     66yo male with history of atrial fibrillation on eliquis, diagnosed with PE 05/2021 (was on xarelto at this time), hyperlipidemia, hypertension, DM, claudication, COPD, CHF, CAD hepatitis C, chronic pain   Dyspnea started this AM Can tell when go into afib, thinks it started yesterday afternoon, tried to lay down, tried using emergency inhaler Usually wait to come in but came in today Went to pain dr this AM and HR was high  Coming from home Afib rate 150-180, received 20mg  cardizem and 53mL NS  No fever or cough No abdominal pain, nausea, vomiting, diarrhea, black or bloody stools   Taking eliquis every day has not missed any doses, but not taking it on exact minute every day      Home Medications Prior to Admission medications   Medication Sig Start Date End Date Taking? Authorizing Provider  apixaban (ELIQUIS) 5 MG TABS tablet Take 1 tablet (5 mg total) by mouth 2 (two) times daily. 01/08/22   Custovic, Collene Mares, DO  clonazePAM (KLONOPIN) 0.5 MG tablet Take 0.5-1 mg by mouth See admin instructions. 05.mg bid and 1mg  every evening as needed for anxiety.    [provider]  dronedarone (MULTAQ) 400 MG tablet Take 1 tablet (400 mg total) by mouth 2 (two) times daily with a meal. 06/22/21   [name], [name], MD  finasteride (PROSCAR) 5 MG tablet Take 5 mg by mouth daily.    [provider]  furosemide (LASIX) 40 MG tablet Take 1 tablet (40 mg total) by mouth 2 (two) times daily. 01/11/22   Custovic, Collene Mares, DO  gabapentin (NEURONTIN) 300 MG capsule Take 300 mg by mouth at bedtime.    [provider]  ipratropium-albuterol (DUONEB) 0.5-2.5 (3) MG/3ML  SOLN Take 3 mLs by nebulization 4 (four) times daily as needed (for shortness of breath or wheezing).    [provider]  isosorbide mononitrate (IMDUR) 30 MG 24 hr tablet Take 1 tablet (30 mg total) by mouth daily. 06/26/21 07/26/21  [name], MD  metoprolol (TOPROL-XL) 200 MG 24 hr tablet Take 100 mg by mouth daily.    [provider]  morphine (MS CONTIN) 30 MG 12 hr tablet Take 30 mg by mouth every 12 (twelve) hours.    [provider]  MOVANTIK 12.5 MG TABS tablet Take 12.5 mg by mouth every morning. 05/30/21   [provider]  Naloxone HCl (KLOXXADO) 8 MG/0.1ML LIQD Place into the nose as needed (for accidental overdose).    [provider]  nitroGLYCERIN (NITROSTAT) 0.4 MG SL tablet Place under the tongue every 5 (five) minutes as needed for chest pain.    [provider]  oxyCODONE-acetaminophen (PERCOCET) 10-325 MG tablet Take 1 tablet by mouth every 4 (four) hours as needed for pain. 05/28/21   [provider]  pantoprazole (PROTONIX) 40 MG tablet Take 40 mg by mouth daily before breakfast.    [provider]  predniSONE (DELTASONE) 10 MG tablet Take 10 mg by mouth daily with breakfast. Continuous.    [provider]  ranolazine (RANEXA) 500 MG 12 hr tablet TAKE ONE TABLET BY MOUTH TWICE DAILY 08/21/21   Cantwell,  Celeste C, PA-C  rivaroxaban (XARELTO) 2.5 MG TABS tablet     [provider]  rosuvastatin (CRESTOR) 10 MG tablet Take 1 tablet (10 mg total) by mouth daily. 01/08/21 06/29/21  [name], Reynold Bowen, MD  sertraline (ZOLOFT) 50 MG tablet Take 50 mg by mouth daily.    [provider]  tamsulosin (FLOMAX) 0.4 MG CAPS capsule Take 0.4 mg by mouth at bedtime.    [provider]  traZODone (DESYREL) 100 MG tablet Take 100 mg by mouth at bedtime. 12/10/21   [provider]  Donnal Debar 200-62.5-25 MCG/INH AEPB Inhale 1 puff into the lungs daily. 01/31/21   [provider]      Allergies    Etomidate and Tape    Review of Systems   Review of Systems  Physical Exam Updated Vital Signs BP 104/68   Pulse 73   Temp 97.6 F (36.4 C) (Oral)   Resp (!) 25   Ht 5\' 11"  (1.803 m)   Wt 93 kg   SpO2 99%   BMI 28.59 kg/m  Physical Exam  ED Results / Procedures / Treatments   Labs (all labs ordered are listed, but only abnormal results are displayed) Labs Reviewed  CBC - Abnormal; Notable for the following components:      Result Value   RBC 4.19 (*)    All other components within normal limits  COMPREHENSIVE METABOLIC PANEL - Abnormal; Notable for the following components:   Potassium 3.0 (*)    Glucose, Bld 136 (*)    Creatinine, Ser 1.54 (*)    Calcium 8.1 (*)    Albumin 3.1 (*)    AST 51 (*)    Total Bilirubin 1.3 (*)    GFR, Estimated 50 (*)    All other components within normal limits  PROTIME-INR - Abnormal; Notable for the following components:   Prothrombin Time 16.7 (*)    INR 1.4 (*)    All other components within normal limits  RAPID URINE DRUG SCREEN, HOSP PERFORMED    EKG EKG Interpretation  Date/Time:  Tuesday February 12 2022 14:25:59 EDT Ventricular Rate:  106 PR Interval:    QRS Duration: 88 QT Interval:  371 QTC Calculation: 507 R Axis:   53 Text Interpretation: Atrial fibrillation Ventricular premature complex Nonspecific repol abnormality, diffuse leads Prolonged QT interval Confirmed by Pattricia Boss [phone]) on 02/12/2022 2:43:59 PM  Radiology DG Chest Port 1 View  Result Date: 02/12/2022 CLINICAL DATA:  Dyspnea EXAM: PORTABLE CHEST 1 VIEW COMPARISON:  Radiograph 06/19/2021 FINDINGS: Mildly enlarged cardiac silhouette, unchanged. Patchy right lower lung airspace opacities. Suspected trace right pleural effusion. No evidence of pneumothorax. No acute osseous abnormality. Thoracic spondylosis. IMPRESSION: Patchy right lower lung airspace disease and suspected trace pleural effusion. Findings could reflect  developing pneumonia. Electronically Signed   By: Maurine Simmering M.D.   On: 02/12/2022 14:58    Procedures Procedures  {Document cardiac monitor, telemetry assessment procedure when appropriate:1}  Medications Ordered in ED Medications  lactated ringers bolus 500 mL (500 mLs Intravenous New Bag/Given 02/12/22 1447)    ED Course/ Medical Decision Making/ A&P                           Medical Decision Making Amount and/or Complexity of Data Reviewed Labs: ordered.  Risk Prescription drug management.   ***  {Document critical care time when appropriate:1} {Document review of labs and clinical decision tools ie heart score,  Chads2Vasc2 etc:1}  {Document your independent review of radiology images, and any outside records:1} {Document your discussion with family members, caretakers, and with consultants:1} {Document social determinants of health affecting pt's care:1} {Document your decision making why or why not admission, treatments were needed:1} Final Clinical Impression(s) / ED Diagnoses Final diagnoses:  None    Rx / DC Orders ED Discharge Orders     None

## 2022-02-12 NOTE — ED Provider Notes (Signed)
66 yo male ho paroxysmal a fib presents with a fib hr 103-124.  Patient states he went to see his pain doctor who prescribes percocet and .  He noted hr high.  Went home and called ambulance to bring him here because he had to get his car home or it would have been towed.  Patient states he has some dyspnea and gets sob with talking or moving a lot. He says he new he was in a fib yesterday but had to get pain clinic today. He has history of cocaine abuse but denies any illicit substances. Denies chest pain other than chronic back pain He reports taking meds as ordered including eliquis this am HR 106 BP 103/69 Sat 99% Speaking in full sentences Hr tachycardiac irregular Lungs cta Orders entered   Pattricia Boss, MD 02/12/22 1442

## 2022-02-12 NOTE — ED Triage Notes (Signed)
Pt BIB EMS from home for afib rvr. HR was 150-180. Pt has hx of afib, CHF. Pt recived 20mg  of cardizem via EMS and 500ML  of NS. Pt is axox4. VSS.

## 2022-02-13 ENCOUNTER — Other Ambulatory Visit: Payer: Self-pay

## 2022-02-13 DIAGNOSIS — I480 Paroxysmal atrial fibrillation: Secondary | ICD-10-CM | POA: Diagnosis present

## 2022-02-13 DIAGNOSIS — Z8249 Family history of ischemic heart disease and other diseases of the circulatory system: Secondary | ICD-10-CM | POA: Diagnosis not present

## 2022-02-13 DIAGNOSIS — Z7901 Long term (current) use of anticoagulants: Secondary | ICD-10-CM | POA: Diagnosis not present

## 2022-02-13 DIAGNOSIS — Z833 Family history of diabetes mellitus: Secondary | ICD-10-CM | POA: Diagnosis not present

## 2022-02-13 DIAGNOSIS — I959 Hypotension, unspecified: Secondary | ICD-10-CM | POA: Diagnosis present

## 2022-02-13 DIAGNOSIS — F1120 Opioid dependence, uncomplicated: Secondary | ICD-10-CM | POA: Diagnosis present

## 2022-02-13 DIAGNOSIS — F419 Anxiety disorder, unspecified: Secondary | ICD-10-CM | POA: Diagnosis present

## 2022-02-13 DIAGNOSIS — I110 Hypertensive heart disease with heart failure: Secondary | ICD-10-CM | POA: Diagnosis present

## 2022-02-13 DIAGNOSIS — F1910 Other psychoactive substance abuse, uncomplicated: Secondary | ICD-10-CM | POA: Diagnosis present

## 2022-02-13 DIAGNOSIS — Z91048 Other nonmedicinal substance allergy status: Secondary | ICD-10-CM | POA: Diagnosis not present

## 2022-02-13 DIAGNOSIS — I4891 Unspecified atrial fibrillation: Secondary | ICD-10-CM | POA: Diagnosis present

## 2022-02-13 DIAGNOSIS — F1410 Cocaine abuse, uncomplicated: Secondary | ICD-10-CM | POA: Diagnosis present

## 2022-02-13 DIAGNOSIS — I2510 Atherosclerotic heart disease of native coronary artery without angina pectoris: Secondary | ICD-10-CM | POA: Diagnosis present

## 2022-02-13 DIAGNOSIS — I5033 Acute on chronic diastolic (congestive) heart failure: Secondary | ICD-10-CM | POA: Diagnosis not present

## 2022-02-13 DIAGNOSIS — Z888 Allergy status to other drugs, medicaments and biological substances status: Secondary | ICD-10-CM | POA: Diagnosis not present

## 2022-02-13 DIAGNOSIS — J449 Chronic obstructive pulmonary disease, unspecified: Secondary | ICD-10-CM | POA: Diagnosis present

## 2022-02-13 DIAGNOSIS — G8929 Other chronic pain: Secondary | ICD-10-CM | POA: Diagnosis present

## 2022-02-13 DIAGNOSIS — R002 Palpitations: Secondary | ICD-10-CM | POA: Diagnosis present

## 2022-02-13 DIAGNOSIS — N179 Acute kidney failure, unspecified: Secondary | ICD-10-CM | POA: Diagnosis present

## 2022-02-13 DIAGNOSIS — I493 Ventricular premature depolarization: Secondary | ICD-10-CM | POA: Diagnosis present

## 2022-02-13 DIAGNOSIS — F1010 Alcohol abuse, uncomplicated: Secondary | ICD-10-CM | POA: Diagnosis present

## 2022-02-13 DIAGNOSIS — E1151 Type 2 diabetes mellitus with diabetic peripheral angiopathy without gangrene: Secondary | ICD-10-CM | POA: Diagnosis present

## 2022-02-13 DIAGNOSIS — E876 Hypokalemia: Secondary | ICD-10-CM | POA: Diagnosis present

## 2022-02-13 DIAGNOSIS — E7800 Pure hypercholesterolemia, unspecified: Secondary | ICD-10-CM | POA: Diagnosis present

## 2022-02-13 DIAGNOSIS — I509 Heart failure, unspecified: Secondary | ICD-10-CM | POA: Diagnosis not present

## 2022-02-13 DIAGNOSIS — F17210 Nicotine dependence, cigarettes, uncomplicated: Secondary | ICD-10-CM | POA: Diagnosis present

## 2022-02-13 LAB — CBC
HCT: 36.8 % — ABNORMAL LOW (ref 39.0–52.0)
Hemoglobin: 12.1 g/dL — ABNORMAL LOW (ref 13.0–17.0)
MCH: 31.8 pg (ref 26.0–34.0)
MCHC: 32.9 g/dL (ref 30.0–36.0)
MCV: 96.8 fL (ref 80.0–100.0)
Platelets: 183 10*3/uL (ref 150–400)
RBC: 3.8 MIL/uL — ABNORMAL LOW (ref 4.22–5.81)
RDW: 14.3 % (ref 11.5–15.5)
WBC: 7.6 10*3/uL (ref 4.0–10.5)
nRBC: 0 % (ref 0.0–0.2)

## 2022-02-13 LAB — BASIC METABOLIC PANEL
Anion gap: 7 (ref 5–15)
BUN: 12 mg/dL (ref 8–23)
CO2: 22 mmol/L (ref 22–32)
Calcium: 8 mg/dL — ABNORMAL LOW (ref 8.9–10.3)
Chloride: 106 mmol/L (ref 98–111)
Creatinine, Ser: 0.98 mg/dL (ref 0.61–1.24)
GFR, Estimated: >60 mL/min (ref >60)
Glucose, Bld: 80 mg/dL (ref 70–99)
Potassium: 3.2 mmol/L — ABNORMAL LOW (ref 3.5–5.1)
Sodium: 135 mmol/L (ref 135–145)

## 2022-02-13 LAB — MAGNESIUM: Magnesium: 2 mg/dL (ref 1.7–2.4)

## 2022-02-13 MED ORDER — SENNOSIDES-DOCUSATE SODIUM 8.6-50 MG PO TABS
1.0000 | ORAL_TABLET | Freq: Two times a day (BID) | ORAL | Status: DC
  Administered 2022-02-13 – 2022-02-15 (×4): 1 via ORAL
  Filled 2022-02-13 (×5): qty 1

## 2022-02-13 MED ORDER — LEVALBUTEROL HCL 0.63 MG/3ML IN NEBU: 0.6300 mg | INHALATION_SOLUTION | Freq: Four times a day (QID) | RESPIRATORY_TRACT | Status: DC

## 2022-02-13 MED ORDER — MORPHINE SULFATE ER 15 MG PO TBCR
15.0000 mg | EXTENDED_RELEASE_TABLET | Freq: Once | ORAL | Status: AC
  Administered 2022-02-13: 15 mg via ORAL

## 2022-02-13 MED ORDER — POTASSIUM CHLORIDE CRYS ER 20 MEQ PO TBCR
40.0000 meq | EXTENDED_RELEASE_TABLET | Freq: Two times a day (BID) | ORAL | Status: AC
  Administered 2022-02-13 (×2): 40 meq via ORAL
  Filled 2022-02-13 (×2): qty 2

## 2022-02-13 MED ORDER — MAGNESIUM SULFATE 2 GM/50ML IV SOLN
2.0000 g | Freq: Once | INTRAVENOUS | Status: AC
  Administered 2022-02-13: 2 g via INTRAVENOUS
  Filled 2022-02-13: qty 50

## 2022-02-13 MED ORDER — POTASSIUM CHLORIDE CRYS ER 20 MEQ PO TBCR
40.0000 meq | EXTENDED_RELEASE_TABLET | Freq: Once | ORAL | Status: AC
  Administered 2022-02-13: 40 meq via ORAL
  Filled 2022-02-13: qty 2

## 2022-02-13 MED ORDER — LEVALBUTEROL HCL 0.63 MG/3ML IN NEBU
0.6300 mg | INHALATION_SOLUTION | Freq: Four times a day (QID) | RESPIRATORY_TRACT | Status: DC
  Administered 2022-02-13 – 2022-02-14 (×5): 0.63 mg via RESPIRATORY_TRACT
  Filled 2022-02-13 (×6): qty 3

## 2022-02-13 MED ORDER — DRONEDARONE HCL 400 MG PO TABS
400.0000 mg | ORAL_TABLET | Freq: Two times a day (BID) | ORAL | Status: DC
  Administered 2022-02-13 – 2022-02-15 (×5): 400 mg via ORAL
  Filled 2022-02-13 (×7): qty 1

## 2022-02-13 NOTE — Patient Outreach (Signed)
Care Coordination  02/13/2022  Kevin Orozco [birth year] [id number]  Kevin Orozco is currently admitted as an inpatient at [facility] team will follow the progress of Kevin Orozco and follow up upon discharge.   Kevin Raider RN, BSN Kevin Orozco  [facility] Managed [employer] High Risk [phone].

## 2022-02-13 NOTE — Progress Notes (Signed)
PROGRESS NOTE    Kevin Orozco  JAS:[id number] DOB: August 07, 1955 DOA: 02/12/2022 PCP: [name], NP  66 y.o. male with history of COPD, chronic pain, narcotic dependence, prior substance abuse, CAD, atrial fibrillation on Eliquis, and anxiety who presented to the emergency department for evaluation of rapid atrial fibrillation. -Developed exertional dyspnea with palpitations X 1 day prior, was seen in pain management clinic 10/24 noted to be in rapid A-fib and referred to the ED, briefly hypotensive given fluid bolus followed by IV diltiazem by EMTs. -In the ED he was emergently cardioverted however returned to rapid A-fib and a few seconds , Chest x-ray notable for patchy right lower lobe airspace disease and suspected trace pleural effusion.  Chemistry panel notable for potassium 3.0 and creatinine 1.54.   [facility] cardiology consulting   Subjective: -Uncomfortable in the ED bed, talking about going home soon  Assessment and Plan:  Atrial fibrillation with RVR  - Cardioverted in ED but returned to rapid a fib within seconds  - IV diltiazem load given and gtt. continued -Continue metoprolol and Eliquis, Multaq was not resumed overnight, will discuss with cards and restart this morning -Monitor QTc, replace potassium   AKI  - SCr is 1.54 on admission, up from apparent baseline of <1  - Likely acute prerenal injury in setting of rapid a fib  -Resolved, resume diuretics tomorrow    Hypokalemia  -Replace    COPD  - Continue LAMA-LABA-ICS, daily low-dose prednisone, and as-needed SABA  -Add Xopenex   Chronic pain  Narcotic dependence -Continue MS Contin,  Percocet, and gabapentin followed by pain clinic   Anxiety  - Continue Zoloft and as-needed Klonopin    CAD  - No anginal complaints  - Continue metoprolol, Crestor, Ranexa, Imdur; not on antiplatelets but on Eliquis     Prolonged QT interval  - QTc is 507 ms in ED  - Replace potassium, mag level okay, avoid QT-prolonging  medications    Tobacco abuse -Still smoking, counseled     DVT prophylaxis: Eliquis  Code Status: Full Family Communication: None present Disposition Plan: Home tomorrow  Consultants: Cards   Procedures:   Antimicrobials:    Objective: Vitals:   02/13/22 0717 02/13/22 0945 02/13/22 0955 02/13/22 1000  BP:  107/73  127/76  Pulse: 82 72  (!) 118  Resp: 20 20  (!) 23  Temp:   97.9 F (36.6 C)   TempSrc:   Oral   SpO2: 97% 97%  98%  Weight:      Height:        Intake/Output Summary (Last 24 hours) at 02/13/2022 1019 Last data filed at 02/13/2022 0502 Gross per 24 hour  Intake 700 ml  Output --  Net 700 ml   Filed Weights   02/12/22 1422  Weight: 93 kg    Examination:  General exam: Pleasant elderly male sitting up in bed, uncomfortable appearing, AAOx3 CVS: S1-S2, irregularly irregular rhythm Lungs: Poor air movement bilaterally, scant expiratory wheezes Abdomen: Soft, obese, nontender, bowel sounds present Extremities: No edema Skin: No rashes Psychiatry:  Mood & affect appropriate.     Data Reviewed:   CBC: Recent Labs  Lab 02/12/22 1430 02/13/22 0307  WBC 7.1 7.6  HGB 13.7 12.1*  HCT 40.5 36.8*  MCV 96.7 96.8  PLT 226 419   Basic Metabolic Panel: Recent Labs  Lab 02/12/22 1430 02/12/22 1721 02/12/22 2307 02/13/22 0307  NA 139 138  --  135  K 3.0* 2.8*  --  3.2*  CL 104 104  --  106  CO2 24 26  --  22  GLUCOSE 136* 99  --  80  BUN 12 12  --  12  CREATININE 1.54* 1.39*  --  0.98  CALCIUM 8.1* 8.0*  --  8.0*  MG  --   --  1.5* 2.0   GFR: Estimated Creatinine Clearance: 89.9 mL/min (by C-G formula based on SCr of 0.98 mg/dL). Liver Function Tests: Recent Labs  Lab 02/12/22 1430 02/12/22 1721  AST 51* 33  ALT 20 20  ALKPHOS 46 41  BILITOT 1.3* 0.7  PROT 6.7 6.6  ALBUMIN 3.1* 3.0*   No results for input(s): "LIPASE", "AMYLASE" in the last 168 hours. No results for input(s): "AMMONIA" in the last 168 hours. Coagulation  Profile: Recent Labs  Lab 02/12/22 1430  INR 1.4*   Cardiac Enzymes: No results for input(s): "CKTOTAL", "CKMB", "CKMBINDEX", "TROPONINI" in the last 168 hours. BNP (last 3 results) No results for input(s): "PROBNP" in the last 8760 hours. HbA1C: No results for input(s): "HGBA1C" in the last 72 hours. CBG: No results for input(s): "GLUCAP" in the last 168 hours. Lipid Profile: No results for input(s): "CHOL", "HDL", "LDLCALC", "TRIG", "CHOLHDL", "LDLDIRECT" in the last 72 hours. Thyroid Function Tests: No results for input(s): "TSH", "T4TOTAL", "FREET4", "T3FREE", "THYROIDAB" in the last 72 hours. Anemia Panel: No results for input(s): "VITAMINB12", "FOLATE", "FERRITIN", "TIBC", "IRON", "RETICCTPCT" in the last 72 hours. Urine analysis: No results found for: "COLORURINE", "APPEARANCEUR", "LABSPEC", "PHURINE", "GLUCOSEU", "HGBUR", "BILIRUBINUR", "KETONESUR", "PROTEINUR", "UROBILINOGEN", "NITRITE", "LEUKOCYTESUR" Sepsis Labs: @LABRCNTIP (procalcitonin:4,lacticidven:4)  )No results found for this or any previous visit (from the past 240 hour(s)).   Radiology Studies: CT Head Wo Contrast  Result Date: 02/12/2022 CLINICAL DATA:  Head trauma.  Coagulopathy EXAM: CT HEAD WITHOUT CONTRAST TECHNIQUE: Contiguous axial images were obtained from the base of the skull through the vertex without intravenous contrast. RADIATION DOSE REDUCTION: This exam was performed according to the departmental dose-optimization program which includes automated exposure control, adjustment of the mA and/or kV according to patient size and/or use of iterative reconstruction technique. COMPARISON:  CT head 01/12/2006 FINDINGS: Brain: Generalized atrophy which has progressed. Negative for hydrocephalus. Patchy hypodensity in the basal ganglia bilaterally most consistent with chronic ischemia which has progressed. Negative for acute infarct, hemorrhage, mass Vascular: Negative for hyperdense vessel Skull: Negative for  skull fracture Sinuses/Orbits: Mild mucosal edema left maxillary sinus. Remaining sinuses clear. Bilateral cataract extraction Other: None IMPRESSION: No acute intracranial abnormality. Atrophy and chronic microvascular ischemia which has progressed since 2007. Electronically Signed   By: Franchot Gallo M.D.   On: 02/12/2022 18:13   DG Chest Port 1 View  Result Date: 02/12/2022 CLINICAL DATA:  Dyspnea EXAM: PORTABLE CHEST 1 VIEW COMPARISON:  Radiograph 06/19/2021 FINDINGS: Mildly enlarged cardiac silhouette, unchanged. Patchy right lower lung airspace opacities. Suspected trace right pleural effusion. No evidence of pneumothorax. No acute osseous abnormality. Thoracic spondylosis. IMPRESSION: Patchy right lower lung airspace disease and suspected trace pleural effusion. Findings could reflect developing pneumonia. Electronically Signed   By: Maurine Simmering M.D.   On: 02/12/2022 14:58     Scheduled Meds:  apixaban  5 mg Oral BID   diltiazem  120 mg Oral Daily   finasteride  5 mg Oral Daily   fluticasone furoate-vilanterol  1 puff Inhalation Daily   And   umeclidinium bromide  1 puff Inhalation Daily   gabapentin  300 mg Oral QHS   isosorbide mononitrate  30 mg Oral Daily  lubiprostone  8 mcg Oral Daily   metoprolol  100 mg Oral Daily   morphine  15 mg Oral Q12H   naloxegol oxalate  12.5 mg Oral q morning   pantoprazole  40 mg Oral QAC breakfast   predniSONE  10 mg Oral Q breakfast   ranolazine  500 mg Oral BID   rosuvastatin  10 mg Oral Daily   senna-docusate  1 tablet Oral BID   sertraline  50 mg Oral Daily   sodium chloride flush  3 mL Intravenous Q12H   tamsulosin  0.4 mg Oral QPC supper   Continuous Infusions:  diltiazem (CARDIZEM) infusion 7.5 mg/hr (02/12/22 2325)     LOS: 0 days    Time spent: 77min    Domenic Polite, MD [facility]   02/13/2022, 10:19 AM

## 2022-02-13 NOTE — ED Notes (Signed)
Cardiology at bedside.

## 2022-02-13 NOTE — Progress Notes (Signed)
Pt arrived from ED, VSS, CHG complete, oriented to unit, call light within reach, orders checked.  Alvis Lemmings, RN 02/13/2022 2:19 PM

## 2022-02-13 NOTE — Progress Notes (Signed)
Heart Failure Navigator Progress Note  Assessed for Heart & Vascular TOC clinic readiness.  Patient does not meet criteria due to [facility] cardiology patient.     Khaliq Turay, BSN, RN Heart Failure Nurse Navigator Secure Chat Only   

## 2022-02-13 NOTE — Consult Note (Signed)
CARDIOLOGY CONSULT NOTE  Patient ID: Kevin Orozco MRN: [id number] DOB/AGE: [birth year] 64 y.o.  Admit date: 02/12/2022 Referring Physician   Primary Physician:  [name], NP Reason for Consultation  Afib RVR  Patient ID: Kevin Orozco, male    DOB: [birth year], 67 y.o.   MRN: [id number]  Chief Complaint  Patient presents with   AFIB RVR    HPI:    Kevin Orozco  is a 66 y.o. male with past medical history significant for paroxysmal atrial fibrillation, CAD, and DM2 who presented to the hospital with rapid heart rate.  He was found to be in atrial fibrillation with rapid ventricular response.  He is compliant with his Eliquis.  Discussed with ER physician last night, since patient is compliant with anticoagulation, plan was to cardiovert in the ED and potentially discharge home.  Spoke with ED physician after this, patient stayed in sinus rhythm for only about 10 minutes.  Plan to admit the patient with a consultation placed to cardiology.  Patient states he has been having palpitations, shortness of breath, and exertional dyspnea.  He states that he knows when he goes into A-fib.  He states that he has not used alcohol or illicit substances in many years now.  His breathing is still really bothering him, he is waiting for his breathing treatments.  This morning he still complaining of some palpitations and shortness of breath.  He denies chest pain, diaphoresis, syncope, edema, orthopnea, PND.     Past Medical History:  Diagnosis Date   Acute respiratory distress 02/22/2015   Atrial fibrillation with RVR ([place]) 04/19/12   New onset; spontaneous conversion to NSR; Pradaxa anticoagulation   CAD (coronary artery disease)    a. Cath 2008- nonobstructive, mod LAD, mild LCx & RCA disease b. Cardiolite 2010 normal c. 03/2012 abnormal Lexiscan Myoview attributed to cocaine d. 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   Elevated LFTs in the setting of acute EtOH abuse, no acute findings on abdominal u/s, HCV+, suspected acute alcohilic heptatitis   HTN (hypertension)    Hypercholesteremia    PAF (paroxysmal atrial fibrillation) ([place]) 02/22/2015   Tobacco abuse    Past Surgical History:  Procedure Laterality Date   APPENDECTOMY     CARDIOVERSION N/A 02/21/2020   Procedure: CARDIOVERSION;  Surgeon: Adrian Prows, MD;  Location: [facility];  Service: Cardiovascular;  Laterality: N/A;   FOOT SURGERY     Social History   Tobacco Use   Smoking status: Every Day    Packs/day: 1.00    Years: 40.00    Total pack years: 40.00    Types: Cigarettes   Smokeless tobacco: Never  Substance Use Topics   Alcohol use: Not Currently    Alcohol/week: 12.0 standard drinks of alcohol    Types: 12 Standard drinks or equivalent per week    Family History  Problem Relation Age of Onset   Heart attack Father 41   Diabetes Mother 24   Diabetes Sister    Heart attack Sister     Marital Status: Divorced  ROS  Review of Systems  Cardiovascular:  Positive for dyspnea on exertion, irregular heartbeat and palpitations.  Respiratory:  Positive for shortness of breath.    Objective      02/13/2022    7:17 AM 02/13/2022    7:00 AM  02/13/2022    6:45 AM  Vitals with BMI  Systolic  [id number]  Diastolic  85 88  Pulse 82 121     Blood pressure 135/85, pulse 82, temperature 98 F (36.7 C), resp. rate 20, height 5\' 11"  (1.803 m), weight 93 kg, SpO2 97 %.    Physical Exam Vitals reviewed.  Cardiovascular:     Rate and Rhythm: Tachycardia present. Rhythm irregular.     Pulses: Normal pulses.     Heart sounds: Murmur heard.  Pulmonary:     Effort: Pulmonary effort is normal.     Breath sounds: Wheezing present.  Abdominal:     General: Bowel sounds are normal.  Musculoskeletal:     Right lower leg: No edema.     Left lower leg: No edema.  Skin:     General: Skin is warm and dry.  Neurological:     Mental Status: He is alert.    Laboratory examination:   Recent Labs    02/12/22 1430 02/12/22 1721 02/13/22 0307  NA 139 138 135  K 3.0* 2.8* 3.2*  CL 104 104 106  CO2 24 26 22   GLUCOSE 136* 99 80  BUN 12 12 12   CREATININE 1.54* 1.39* 0.98  CALCIUM 8.1* 8.0* 8.0*  GFRNONAA 50* 57* >60   estimated creatinine clearance is 89.9 mL/min (by C-G formula based on SCr of 0.98 mg/dL).     Latest Ref Rng & Units 02/13/2022    3:07 AM 02/12/2022    5:21 PM 02/12/2022    2:30 PM  CMP  Glucose 70 - 99 mg/dL 80  99  136   BUN 8 - 23 mg/dL 12  12  12    Creatinine 0.61 - 1.24 mg/dL 0.98  1.39  1.54   Sodium 135 - 145 mmol/L 135  138  139   Potassium 3.5 - 5.1 mmol/L 3.2  2.8  3.0   Chloride 98 - 111 mmol/L 106  104  104   CO2 22 - 32 mmol/L 22  26  24    Calcium 8.9 - 10.3 mg/dL 8.0  8.0  8.1   Total Protein 6.5 - 8.1 g/dL  6.6  6.7   Total Bilirubin 0.3 - 1.2 mg/dL  0.7  1.3   Alkaline Phos 38 - 126 U/L  41  46   AST 15 - 41 U/L  33  51   ALT 0 - 44 U/L  20  20       Latest Ref Rng & Units 02/13/2022    3:07 AM 02/12/2022    2:30 PM 06/25/2021    3:29 AM  CBC  WBC 4.0 - 10.5 K/uL 7.6  7.1  6.3   Hemoglobin 13.0 - 17.0 g/dL 12.1  13.7  12.7   Hematocrit 39.0 - 52.0 % 36.8  40.5  37.9   Platelets 150 - 400 K/uL 183  226  151    Lipid Panel Recent Labs    06/21/21 0449  CHOL 83  TRIG 37  LDLCALC 39  VLDL 7  HDL 37*  CHOLHDL 2.2    HEMOGLOBIN A1C Lab Results  Component Value Date   HGBA1C 5.4 09/09/2019   MPG 97 02/22/2015   TSH No results for input(s): "TSH" in the last 8760 hours. BNP (last 3 results) Recent Labs    02/23/21 1221 06/19/21 2238  BNP 510.6* 708.0*   Cardiac Panel (last 3 results) No results for input(s): "CKTOTAL", "CKMB", "TROPONINIHS", "RELINDX" in the last 72  hours.   Medications and allergies   Allergies  Allergen Reactions   Etomidate Anxiety   Tape Other (See Comments)    SKIN  WILL TEAR EASILY!!!!   Ketamine     Agitation, negative hallucinations (50mg  for sedation)     Current Meds  Medication Sig   apixaban (ELIQUIS) 5 MG TABS tablet Take 1 tablet (5 mg total) by mouth 2 (two) times daily.   clonazePAM (KLONOPIN) 0.5 MG tablet Take 0.5-1 mg by mouth 4 (four) times daily as needed for anxiety.   diltiazem (CARDIZEM CD) 120 MG 24 hr capsule Take 120 mg by mouth daily.   dronedarone (MULTAQ) 400 MG tablet Take 1 tablet (400 mg total) by mouth 2 (two) times daily with a meal.   finasteride (PROSCAR) 5 MG tablet Take 5 mg by mouth daily.   furosemide (LASIX) 40 MG tablet Take 1 tablet (40 mg total) by mouth 2 (two) times daily.   gabapentin (NEURONTIN) 300 MG capsule Take 300 mg by mouth at bedtime.   ipratropium-albuterol (DUONEB) 0.5-2.5 (3) MG/3ML SOLN Take 3 mLs by nebulization 4 (four) times daily as needed (for shortness of breath or wheezing).   isosorbide mononitrate (IMDUR) 30 MG 24 hr tablet Take 1 tablet (30 mg total) by mouth daily.   lubiprostone (AMITIZA) 8 MCG capsule Take 8 mcg by mouth daily.   metoprolol (TOPROL-XL) 200 MG 24 hr tablet Take 100 mg by mouth daily.   morphine (MS CONTIN) 30 MG 12 hr tablet Take 30 mg by mouth every 12 (twelve) hours.   MOVANTIK 12.5 MG TABS tablet Take 12.5 mg by mouth every morning.   Naloxone HCl (KLOXXADO) 8 MG/0.1ML LIQD Place 8 mg into the nose as needed (for accidental overdose).   nitroGLYCERIN (NITROSTAT) 0.4 MG SL tablet Place under the tongue every 5 (five) minutes as needed for chest pain.   oxyCODONE-acetaminophen (PERCOCET) 10-325 MG tablet Take 1 tablet by mouth every 4 (four) hours as needed for pain.   pantoprazole (PROTONIX) 40 MG tablet Take 40 mg by mouth daily before breakfast.   predniSONE (DELTASONE) 10 MG tablet Take 10 mg by mouth daily with breakfast. Continuous.   ranolazine (RANEXA) 500 MG 12 hr tablet TAKE ONE TABLET BY MOUTH TWICE DAILY (Patient taking differently: Take 500 mg by mouth 2  (two) times daily.)   rosuvastatin (CRESTOR) 10 MG tablet Take 1 tablet (10 mg total) by mouth daily.   sertraline (ZOLOFT) 50 MG tablet Take 50 mg by mouth daily.   tamsulosin (FLOMAX) 0.4 MG CAPS capsule Take 0.4 mg by mouth at bedtime.   traZODone (DESYREL) 100 MG tablet Take 100 mg by mouth at bedtime as needed for sleep.   TRELEGY ELLIPTA 200-62.5-25 MCG/INH AEPB Inhale 1 puff into the lungs daily.   VENTOLIN HFA 108 (90 Base) MCG/ACT inhaler Inhale 1-2 puffs into the lungs every 4 (four) hours as needed for wheezing or shortness of breath.    Scheduled Meds:  apixaban  5 mg Oral BID   apixaban  5 mg Oral Once   diltiazem  120 mg Oral Daily   finasteride  5 mg Oral Daily   fluticasone furoate-vilanterol  1 puff Inhalation Daily   And   umeclidinium bromide  1 puff Inhalation Daily   gabapentin  300 mg Oral QHS   isosorbide mononitrate  30 mg Oral Daily   lubiprostone  8 mcg Oral Daily   metoprolol  100 mg Oral Daily   morphine  15 mg  Oral Q12H   naloxegol oxalate  12.5 mg Oral q morning   pantoprazole  40 mg Oral QAC breakfast   predniSONE  10 mg Oral Q breakfast   ranolazine  500 mg Oral BID   rosuvastatin  10 mg Oral Daily   sertraline  50 mg Oral Daily   sodium chloride flush  3 mL Intravenous Q12H   tamsulosin  0.4 mg Oral QPC supper   Continuous Infusions:  diltiazem (CARDIZEM) infusion 7.5 mg/hr (02/12/22 2325)   PRN Meds:.acetaminophen **OR** acetaminophen, clonazePAM, ipratropium-albuterol, oxyCODONE-acetaminophen **AND** oxyCODONE, traZODone   I/O last 3 completed shifts: In: 700 [IV Piggyback:700] Out: -  No intake/output data recorded.  Net IO Since Admission: 700 mL [02/13/22 0905]   Radiology:   Imaging results have been reviewed and CT Head Wo Contrast  Result Date: 02/12/2022 CLINICAL DATA:  Head trauma.  Coagulopathy EXAM: CT HEAD WITHOUT CONTRAST TECHNIQUE: Contiguous axial images were obtained from the base of the skull through the vertex without  intravenous contrast. RADIATION DOSE REDUCTION: This exam was performed according to the departmental dose-optimization program which includes automated exposure control, adjustment of the mA and/or kV according to patient size and/or use of iterative reconstruction technique. COMPARISON:  CT head 01/12/2006 FINDINGS: Brain: Generalized atrophy which has progressed. Negative for hydrocephalus. Patchy hypodensity in the basal ganglia bilaterally most consistent with chronic ischemia which has progressed. Negative for acute infarct, hemorrhage, mass Vascular: Negative for hyperdense vessel Skull: Negative for skull fracture Sinuses/Orbits: Mild mucosal edema left maxillary sinus. Remaining sinuses clear. Bilateral cataract extraction Other: None IMPRESSION: No acute intracranial abnormality. Atrophy and chronic microvascular ischemia which has progressed since 2007. Electronically Signed   By: Franchot Gallo M.D.   On: 02/12/2022 18:13   DG Chest Port 1 View  Result Date: 02/12/2022 CLINICAL DATA:  Dyspnea EXAM: PORTABLE CHEST 1 VIEW COMPARISON:  Radiograph 06/19/2021 FINDINGS: Mildly enlarged cardiac silhouette, unchanged. Patchy right lower lung airspace opacities. Suspected trace right pleural effusion. No evidence of pneumothorax. No acute osseous abnormality. Thoracic spondylosis. IMPRESSION: Patchy right lower lung airspace disease and suspected trace pleural effusion. Findings could reflect developing pneumonia. Electronically Signed   By: Maurine Simmering M.D.   On: 02/12/2022 14:58    Cardiac Studies:     Cardiovascular and other pertinent studies: EKG 06/29/2021: Sinus bradycardia at a rate of 50 bpm.  Normal axis.  Left atrial enlargement.  No evidence of ischemia or underlying injury pattern.  Normal QT.    Echocardiogram 06/20/2021:   1. Left ventricular ejection fraction, by estimation, is 60 to 65%. The left ventricle has normal function. The left ventricle has no regional wall motion  abnormalities. There is mild left ventricular hypertrophy. Left ventricular diastolic parameters are indeterminate.   2. Right ventricular systolic function is moderately reduced. The right ventricular size is normal.   3. Left atrial size was severely dilated.   4. Right atrial size was moderately dilated.   5. The mitral valve is grossly normal. Mild mitral valve regurgitation.   6. The aortic valve is grossly normal. Aortic valve regurgitation is trivial.    Direct-current cardioversion in the ED 06/19/2021:  Successful return to normal sinus rhythm with single shock at 100 J   EKG 06/05/2021:  Atrial fibrillation with rapid ventricular response at a rate of 144 bpm.   Direct-current cardioversion in the ED 02/23/2021 Successful return to normal sinus rhythm from atrial fibrillation with RVR.   EKG 02/23/2021: Atrial fibrillation with rapid ventricular response at a  rate of 121 bpm.   PCV ECHOCARDIOGRAM COMPLETE 02/13/2021 Left ventricle cavity is normal in size. Moderate concentric hypertrophy of the left ventricle. Normal global wall motion. Normal LV systolic function with visual EF 50-55%. Diastolic function not assessed due to atrial fibrillation. Left atrial cavity is mildly dilated. Trileaflet aortic valve. Moderate aortic valve leaflet calcification. Mild (Grade I) aortic regurgitation. Mild mitral valve leaflet calcification. Mildly restricted mitral valve leaflets without significant stenosis. Moderate (Grade III) mitral regurgitation. Mild to moderate tricuspid regurgitation. Estimated pulmonary artery systolic pressure 29 mmHg. Mild to moderate pulmonic regurgitation. No significant change compared to previous study in 2021.   EKG 07/12/2019: Atrial fibrillation, controlled ventricular rate 90 bpm. Nonspecific T-abnormality.    EKG 12/12/2019: Sinus bradycardia, rate 58 Nonspecific T-abnormality.    Coronary angiography 2012: LM: Normal LAD: Ostial 40-50% stenosis, mid  LAD 30% stenosis LCx: OM2 30% stenosis RCA: Mid 20% stenosis and posterior lateral branch  EKG 02/12/2022: Afib RVR, rate 106, prolonged QT-interval  Assessment   Paroxysmal atrial fibrillation with RVR  COPD, suspect acute exacerbation  AKI  CAD, patient has refused intervention in the past  DM2    Recommendations:   Regarding atrial fibrillation Rate control:  metoprolol, cardizem Rhythm control: Multaq Thromboembolic prophylaxis: Eliquis [id number] score 4, annual stroke risk 5% Patient had previously maintained normal sinus rhythm since cardioversion 06/19/2021 Cardizem was discontinued at last office visit 12/11/2021 due to low heart rate and BP, however, patient will likely need to go back on Cardizem in addition to his other cardiac medications. Would obtain echocardiogram. Maintain on telemetry Optimize electrolytes, K+>4 and Mag>2. Avoid Qtc prolonging drugs Continue breathing treatments.      Floydene Flock, DO, [facility] 02/13/2022, 9:05 AM Office: [phone]

## 2022-02-13 NOTE — ED Notes (Signed)
ED TO INPATIENT HANDOFF REPORT  ED Nurse Name and Phone #: [phone]  S Name/Age/Gender Kevin Orozco 66 y.o. male Room/Bed: 035C/035C  Code Status   Code Status: Full Code  Home/SNF/Other Home Patient oriented to: self, place, time, and situation Is this baseline? Yes   Triage Complete: Triage complete  Chief Complaint Atrial fibrillation with RVR ([place]) [I48.91] A-fib ([place]) [I48.91]  Triage Note Pt BIB EMS from home for afib rvr. HR was 150-180. Pt has hx of afib, CHF. Pt recived 20mg  of cardizem via EMS and 500ML  of NS. Pt is axox4. VSS.    Allergies Allergies  Allergen Reactions   Etomidate Anxiety   Tape Other (See Comments)    SKIN WILL TEAR EASILY!!!!   Ketamine     Agitation, negative hallucinations (50mg  for sedation)    Level of Care/Admitting Diagnosis ED Disposition     ED Disposition  Admit   Condition  --   Comment  Hospital Area: [place] [[id number]]  Level of Care: Progressive [102]  Admit to Progressive based on following criteria: CARDIOVASCULAR & THORACIC of moderate stability with acute coronary syndrome symptoms/low risk myocardial infarction/hypertensive urgency/arrhythmias/heart failure potentially compromising stability and stable post cardiovascular intervention patients.  Admit to Progressive based on following criteria: GI, ENDOCRINE disease patients with GI bleeding, acute liver failure or pancreatitis, stable with diabetic ketoacidosis or thyrotoxicosis (hypothyroid) state.  May admit patient to [facility] or [facility] if equivalent level of care is available:: Yes  Covid Evaluation: Asymptomatic - no recent exposure (last 10 days) testing not required  Diagnosis: A-fib [facility]) [id number]  Admitting Physician: Vianne Bulls [id number]  Attending Physician: Domenic Polite [id number]  Certification:: I certify this patient will need inpatient services for at least 2 midnights  Estimated Length of Stay: 2           B Medical/Surgery History Past Medical History:  Diagnosis Date   Acute respiratory distress 02/22/2015   Atrial fibrillation with RVR ([place]) 04/19/12   New onset; spontaneous conversion to NSR; Pradaxa anticoagulation   CAD (coronary artery disease)    a. Cath 2008- nonobstructive, mod LAD, mild LCx & RCA disease b. Cardiolite 2010 normal c. 03/2012 abnormal Lexiscan Myoview attributed to cocaine d. 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   Elevated LFTs in the setting of acute EtOH abuse, no acute findings on abdominal u/s, HCV+, suspected acute alcohilic heptatitis   HTN (hypertension)    Hypercholesteremia    PAF (paroxysmal atrial fibrillation) ([place]) 02/22/2015   Tobacco abuse    Past Surgical History:  Procedure Laterality Date   APPENDECTOMY     CARDIOVERSION N/A 02/21/2020   Procedure: CARDIOVERSION;  Surgeon: Adrian Prows, MD;  Location: [place];  Service: Cardiovascular;  Laterality: N/A;   FOOT SURGERY       A IV Location/Drains/Wounds Patient Lines/Drains/Airways Status     Active Line/Drains/Airways     Name Placement date Placement time Site Days   Peripheral IV 02/12/22 20 G Left Antecubital 02/12/22  1424  Antecubital  1   Peripheral IV 02/12/22 20 G Right Antecubital 02/12/22  2027  Antecubital  1            Intake/Output Last 24 hours  Intake/Output Summary (Last 24 hours) at 02/13/2022 1213 Last data filed at 02/13/2022 0502  Gross per 24 hour  Intake 700 ml  Output --  Net 700 ml    Labs/Imaging Results for orders placed or performed during the hospital encounter of 02/12/22 (from the past 48 hour(s))  CBC     Status: Abnormal   Collection Time: 02/12/22  2:30 PM  Result Value Ref Range   WBC 7.1 4.0 - 10.5 K/uL   RBC 4.19 (L) 4.22 - 5.81 MIL/uL   Hemoglobin 13.7 13.0 - 17.0 g/dL   HCT 40.5 39.0  - 52.0 %   MCV 96.7 80.0 - 100.0 fL   MCH 32.7 26.0 - 34.0 pg   MCHC 33.8 30.0 - 36.0 g/dL   RDW 14.3 11.5 - 15.5 %   Platelets 226 150 - 400 K/uL   nRBC 0.0 0.0 - 0.2 %    Comment: Performed at [facility], [place] [address] [place], [place] [address]  Comprehensive metabolic panel     Status: Abnormal   Collection Time: 02/12/22  2:30 PM  Result Value Ref Range   Sodium 139 135 - 145 mmol/L   Potassium 3.0 (L) 3.5 - 5.1 mmol/L    Comment: HEMOLYSIS AT THIS LEVEL MAY AFFECT RESULT   Chloride 104 98 - 111 mmol/L   CO2 24 22 - 32 mmol/L   Glucose, Bld 136 (H) 70 - 99 mg/dL    Comment: Glucose reference range applies only to samples taken after fasting for at least 8 hours.   BUN 12 8 - 23 mg/dL   Creatinine, Ser 1.54 (H) 0.61 - 1.24 mg/dL   Calcium 8.1 (L) 8.9 - 10.3 mg/dL   Total Protein 6.7 6.5 - 8.1 g/dL   Albumin 3.1 (L) 3.5 - 5.0 g/dL   AST 51 (H) 15 - 41 U/L    Comment: HEMOLYSIS AT THIS LEVEL MAY AFFECT RESULT   ALT 20 0 - 44 U/L    Comment: HEMOLYSIS AT THIS LEVEL MAY AFFECT RESULT   Alkaline Phosphatase 46 38 - 126 U/L   Total Bilirubin 1.3 (H) 0.3 - 1.2 mg/dL    Comment: HEMOLYSIS AT THIS LEVEL MAY AFFECT RESULT   GFR, Estimated 50 (L) >60 mL/min    Comment: (NOTE) Calculated using the CKD-EPI Creatinine Equation (2021)    Anion gap 11 5 - 15    Comment: Performed at [facility], [place] [address], [place], [place] [address]  Protime-INR     Status: Abnormal   Collection Time: 02/12/22  2:30 PM  Result Value Ref Range   Prothrombin Time 16.7 (H) 11.4 - 15.2 seconds   INR 1.4 (H) 0.8 - 1.2    Comment: (NOTE) INR goal varies based on device and disease states. Performed at [facility], [place] [address], [place], [place] [address]   Comprehensive metabolic panel     Status: Abnormal   Collection Time: 02/12/22  5:21 PM  Result Value Ref Range   Sodium 138 135 - 145 mmol/L   Potassium 2.8 (L) 3.5 - 5.1 mmol/L   Chloride 104 98 - 111 mmol/L   CO2  26 22 - 32 mmol/L   Glucose, Bld 99 70 - 99 mg/dL    Comment: Glucose reference range applies only to samples taken after fasting for at least 8 hours.   BUN 12 8 - 23 mg/dL   Creatinine, Ser 1.39 (H) 0.61 - 1.24 mg/dL   Calcium 8.0 (L) 8.9 - 10.3 mg/dL   Total Protein 6.6 6.5 - 8.1 g/dL  Albumin 3.0 (L) 3.5 - 5.0 g/dL   AST 33 15 - 41 U/L   ALT 20 0 - 44 U/L   Alkaline Phosphatase 41 38 - 126 U/L   Total Bilirubin 0.7 0.3 - 1.2 mg/dL   GFR, Estimated 57 (L) >60 mL/min    Comment: (NOTE) Calculated using the CKD-EPI Creatinine Equation (2021)    Anion gap 8 5 - 15    Comment: Performed at [facility] [address], [place], [place] [address]  Magnesium     Status: Abnormal   Collection Time: 02/12/22 11:07 PM  Result Value Ref Range   Magnesium 1.5 (L) 1.7 - 2.4 mg/dL    Comment: Performed at [facility] [address] [place], [place] [id number]  Basic metabolic panel     Status: Abnormal   Collection Time: 02/13/22  3:07 AM  Result Value Ref Range   Sodium 135 135 - 145 mmol/L   Potassium 3.2 (L) 3.5 - 5.1 mmol/L   Chloride 106 98 - 111 mmol/L   CO2 22 22 - 32 mmol/L   Glucose, Bld 80 70 - 99 mg/dL    Comment: Glucose reference range applies only to samples taken after fasting for at least 8 hours.   BUN 12 8 - 23 mg/dL   Creatinine, Ser 0.98 0.61 - 1.24 mg/dL   Calcium 8.0 (L) 8.9 - 10.3 mg/dL   GFR, Estimated >60 >60 mL/min    Comment: (NOTE) Calculated using the CKD-EPI Creatinine Equation (2021)    Anion gap 7 5 - 15    Comment: Performed at [facility] [address], [place], [place] [address]  Magnesium     Status: None   Collection Time: 02/13/22  3:07 AM  Result Value Ref Range   Magnesium 2.0 1.7 - 2.4 mg/dL    Comment: Performed at [facility] [address], [place] [address]  CBC     Status: Abnormal   Collection Time: 02/13/22  3:07 AM  Result Value Ref Range   WBC 7.6 4.0 - 10.5 K/uL   RBC 3.80 (L) 4.22 -  5.81 MIL/uL   Hemoglobin 12.1 (L) 13.0 - 17.0 g/dL   HCT 36.8 (L) 39.0 - 52.0 %   MCV 96.8 80.0 - 100.0 fL   MCH 31.8 26.0 - 34.0 pg   MCHC 32.9 30.0 - 36.0 g/dL   RDW 14.3 11.5 - 15.5 %   Platelets 183 150 - 400 K/uL   nRBC 0.0 0.0 - 0.2 %    Comment: Performed at [facility], [place] [address], [place],  [address]   CT Head Wo Contrast  Result Date: 02/12/2022 CLINICAL DATA:  Head trauma.  Coagulopathy EXAM: CT HEAD WITHOUT CONTRAST TECHNIQUE: Contiguous axial images were obtained from the base of the skull through the vertex without intravenous contrast. RADIATION DOSE REDUCTION: This exam was performed according to the departmental dose-optimization program which includes automated exposure control, adjustment of the mA and/or kV according to patient size and/or use of iterative reconstruction technique. COMPARISON:  CT head 01/12/2006 FINDINGS: Brain: Generalized atrophy which has progressed. Negative for hydrocephalus. Patchy hypodensity in the basal ganglia bilaterally most consistent with chronic ischemia which has progressed. Negative for acute infarct, hemorrhage, mass Vascular: Negative for hyperdense vessel Skull: Negative for skull fracture Sinuses/Orbits: Mild mucosal edema left maxillary sinus. Remaining sinuses clear. Bilateral cataract extraction Other: None IMPRESSION: No acute intracranial abnormality. Atrophy and chronic microvascular  ischemia which has progressed since 2007. Electronically Signed   By: Franchot Gallo M.D.   On: 02/12/2022 18:13   DG Chest Port 1 View  Result Date: 02/12/2022 CLINICAL DATA:  Dyspnea EXAM: PORTABLE CHEST 1 VIEW COMPARISON:  Radiograph 06/19/2021 FINDINGS: Mildly enlarged cardiac silhouette, unchanged. Patchy right lower lung airspace opacities. Suspected trace right pleural effusion. No evidence of pneumothorax. No acute osseous abnormality. Thoracic spondylosis. IMPRESSION: Patchy right lower lung airspace disease and suspected trace  pleural effusion. Findings could reflect developing pneumonia. Electronically Signed   By: Maurine Simmering M.D.   On: 02/12/2022 14:58    Pending Labs Unresulted Labs (From admission, onward)     Start     Ordered   02/13/22 3220  Basic metabolic panel  Daily at 5am,   R      02/12/22 2142   02/13/22 0500  CBC  Daily at 5am,   R      02/12/22 2142   02/12/22 1504  Rapid urine drug screen (hospital performed)  ONCE - STAT,   STAT        02/12/22 1504            Vitals/Pain Today's Vitals   02/13/22 1100 02/13/22 1115 02/13/22 1116 02/13/22 1130  BP: 116/72 100/70    Pulse: (!) 52 (!) 112 84   Resp: 16 (!) 22 20   Temp:      TempSrc:      SpO2: 96% 94% 96%   Weight:      Height:      PainSc:    5     Isolation Precautions No active isolations  Medications Medications  diltiazem (CARDIZEM) 1 mg/mL load via infusion 15 mg (15 mg Intravenous Bolus from Bag 02/12/22 2132)    And  diltiazem (CARDIZEM) 125 mg in dextrose 5% 125 mL (1 mg/mL) infusion (0 mg/hr Intravenous Stopped 02/13/22 1127)  isosorbide mononitrate (IMDUR) 24 hr tablet 30 mg (30 mg Oral Given 02/13/22 1005)  metoprolol succinate (TOPROL-XL) 24 hr tablet 100 mg (100 mg Oral Given 02/13/22 1002)  ranolazine (RANEXA) 12 hr tablet 500 mg (500 mg Oral Given 02/13/22 1004)  rosuvastatin (CRESTOR) tablet 10 mg (10 mg Oral Given 02/13/22 1004)  sertraline (ZOLOFT) tablet 50 mg (50 mg Oral Given 02/13/22 1005)  traZODone (DESYREL) tablet 100 mg (has no administration in time range)  predniSONE (DELTASONE) tablet 10 mg (10 mg Oral Given 02/13/22 0756)  lubiprostone (AMITIZA) capsule 8 mcg (8 mcg Oral Given 02/13/22 1000)  naloxegol oxalate (MOVANTIK) tablet 12.5 mg (12.5 mg Oral Given 02/13/22 0959)  pantoprazole (PROTONIX) EC tablet 40 mg (40 mg Oral Given 02/13/22 0757)  finasteride (PROSCAR) tablet 5 mg (5 mg Oral Given 02/13/22 1002)  tamsulosin (FLOMAX) capsule 0.4 mg (has no administration in time range)   apixaban (ELIQUIS) tablet 5 mg (5 mg Oral Given 02/13/22 1002)  clonazePAM (KLONOPIN) tablet 0.5-1 mg (has no administration in time range)  gabapentin (NEURONTIN) capsule 300 mg (300 mg Oral Given 02/12/22 2325)  ipratropium-albuterol (DUONEB) 0.5-2.5 (3) MG/3ML nebulizer solution 3 mL (has no administration in time range)  fluticasone furoate-vilanterol (BREO ELLIPTA) 200-25 MCG/ACT 1 puff (1 puff Inhalation Given 02/13/22 1007)    And  umeclidinium bromide (INCRUSE ELLIPTA) 62.5 MCG/ACT 1 puff (1 puff Inhalation Given 02/13/22 1007)  sodium chloride flush (NS) 0.9 % injection 3 mL (3 mLs Intravenous Not Given 02/13/22 1000)  acetaminophen (TYLENOL) tablet 650 mg (has no administration in time range)    Or  acetaminophen (TYLENOL) suppository 650 mg (has no administration in time range)  morphine (MS CONTIN) 12 hr tablet 15 mg (15 mg Oral Not Given 02/13/22 0833)  oxyCODONE-acetaminophen (PERCOCET/ROXICET) 5-325 MG per tablet 1 tablet (1 tablet Oral Given 02/13/22 0958)    And  oxyCODONE (Oxy IR/ROXICODONE) immediate release tablet 5 mg (5 mg Oral Given 02/13/22 0957)  senna-docusate (Senokot-S) tablet 1 tablet (1 tablet Oral Given 02/13/22 1005)  potassium chloride SA (KLOR-CON M) CR tablet 40 mEq (40 mEq Oral Given 02/13/22 1130)  levalbuterol (XOPENEX) nebulizer solution 0.63 mg (has no administration in time range)  dronedarone (MULTAQ) tablet 400 mg (has no administration in time range)  lactated ringers bolus 500 mL (0 mLs Intravenous Stopped 02/12/22 1722)  fentaNYL (SUBLIMAZE) injection 25 mcg (25 mcg Intravenous Given 02/12/22 1722)  ketamine 50 mg in normal saline 5 mL (10 mg/mL) syringe (47 mg Intravenous Given 02/12/22 2032)  potassium chloride 10 mEq in 100 mL IVPB (0 mEq Intravenous Stopped 02/13/22 0502)  potassium chloride SA (KLOR-CON M) CR tablet 40 mEq (40 mEq Oral Given 02/12/22 2326)  fentaNYL (SUBLIMAZE) injection 50 mcg (50 mcg Intravenous Given 02/12/22 2326)   magnesium sulfate IVPB 2 g 50 mL (0 g Intravenous Stopped 02/13/22 0502)  potassium chloride SA (KLOR-CON M) CR tablet 40 mEq (40 mEq Oral Given 02/13/22 0755)  morphine (MS CONTIN) 12 hr tablet 15 mg (15 mg Oral Given 02/13/22 [id number])    Mobility walks with person assist Low fall risk   Focused Assessments    R Recommendations: See Admitting Provider Note  Report given to:   Additional Notes:

## 2022-02-14 DIAGNOSIS — I4891 Unspecified atrial fibrillation: Secondary | ICD-10-CM | POA: Diagnosis not present

## 2022-02-14 MED ORDER — DILTIAZEM HCL ER COATED BEADS 120 MG PO CP24
120.0000 mg | ORAL_CAPSULE | Freq: Every day | ORAL | Status: DC
  Administered 2022-02-14 – 2022-02-15 (×2): 120 mg via ORAL
  Filled 2022-02-14 (×2): qty 1

## 2022-02-14 MED ORDER — LEVALBUTEROL HCL 0.63 MG/3ML IN NEBU
0.6300 mg | INHALATION_SOLUTION | Freq: Three times a day (TID) | RESPIRATORY_TRACT | Status: DC
  Administered 2022-02-15: 0.63 mg via RESPIRATORY_TRACT
  Filled 2022-02-14 (×2): qty 3

## 2022-02-14 MED ORDER — POTASSIUM CHLORIDE CRYS ER 20 MEQ PO TBCR
40.0000 meq | EXTENDED_RELEASE_TABLET | Freq: Once | ORAL | Status: AC
  Administered 2022-02-14: 40 meq via ORAL
  Filled 2022-02-14: qty 2

## 2022-02-14 MED ORDER — FUROSEMIDE 10 MG/ML IJ SOLN
20.0000 mg | Freq: Once | INTRAMUSCULAR | Status: AC
  Administered 2022-02-14: 20 mg via INTRAVENOUS
  Filled 2022-02-14: qty 2

## 2022-02-14 NOTE — Progress Notes (Signed)
Patient refused AM labs and standing weight.

## 2022-02-14 NOTE — Progress Notes (Signed)
PROGRESS NOTE    Kevin Orozco  [id number] DOB: Sep 07, 1955 DOA: 02/12/2022 PCP: [name], NP  66 y.o. male with history of COPD, chronic pain, narcotic dependence, prior substance abuse, CAD, atrial fibrillation on Eliquis, and anxiety who presented to the emergency department for evaluation of rapid atrial fibrillation. -Developed exertional dyspnea with palpitations X 1 day prior, was seen in pain management clinic 10/24 noted to be in rapid A-fib and referred to the ED, briefly hypotensive given fluid bolus followed by IV diltiazem by EMTs. -In the ED he was emergently cardioverted however returned to rapid A-fib and a few seconds , Chest x-ray notable for patchy right lower lobe airspace disease and suspected trace pleural effusion.  Chemistry panel notable for potassium 3.0 and creatinine 1.54.   [name] cardiology consulting   Subjective: -Refused early morning labs today, feels fair  Assessment and Plan:  Atrial fibrillation with RVR  - Cardioverted in ED but returned to rapid a fib within seconds  - IV diltiazem load given and gtt., now off, p.o. Cardizem resumed -Continue metoprolol, Multaq and Eliquis -Cards following -Labs pending -EKG in a.m. as Cardizem can increase Multaq level and worsening QTc  Acute on chronic diastolic CHF -Appears mildly volume overloaded, IV Lasix X1 dose today -Last echo 3/23 with preserved EF, moderately reduced RV function   AKI  - SCr is 1.54 on admission, up from apparent baseline of <1  - Likely acute prerenal injury in setting of rapid a fib  -Resolved, resume diuretics tomorrow    Hypokalemia  -Replaced, repeat labs pending    COPD  - Continue LAMA-LABA-ICS, daily low-dose prednisone, and as-needed SABA  -Add Xopenex   Chronic pain  Narcotic dependence -Continue MS Contin,  Percocet, and gabapentin followed by pain clinic   Anxiety  - Continue Zoloft and as-needed Klonopin    CAD  - No anginal complaints  -  Continue metoprolol, Crestor, Ranexa, Imdur; not on antiplatelets but on Eliquis     Prolonged QT interval  -Repeat EKG tomorrow, today's labs are pending, mag was normal yesterday  Tobacco abuse -Still smoking, counseled     DVT prophylaxis: Eliquis  Code Status: Full Family Communication: None present Disposition Plan: Home tomorrow  Consultants: Cards   Procedures:   Antimicrobials:    Objective: Vitals:   02/14/22 0009 02/14/22 0430 02/14/22 0748 02/14/22 0829  BP: 111/69 127/84 125/66   Pulse:  (!) 113 83   Resp: 15 19 20    Temp: 98 F (36.7 C) 98.4 F (36.9 C)    TempSrc: Oral Oral    SpO2: 99% 99% 95% 95%  Weight:      Height:        Intake/Output Summary (Last 24 hours) at 02/14/2022 0930 Last data filed at 02/14/2022 0650 Gross per 24 hour  Intake 1033.81 ml  Output 850 ml  Net 183.81 ml   Filed Weights   02/12/22 1422  Weight: 93 kg    Examination:  General exam: Pleasant elderly male sitting up in bed, AAOx3, no distress HEENT: Positive JVD CVS: S1-S2, irregularly irregular rhythm Lungs: Poor air movement bilaterally, diminished at the bases Abdomen: Soft, nontender, bowel sounds present Extremities: No edema  Skin: No rashes Psychiatry: Angry and irritable     Data Reviewed:   CBC: Recent Labs  Lab 02/12/22 1430 02/13/22 0307  WBC 7.1 7.6  HGB 13.7 12.1*  HCT 40.5 36.8*  MCV 96.7 96.8  PLT 226 [id number]   Basic Metabolic Panel: Recent Labs  Lab 02/12/22 1430 02/12/22 1721 02/12/22 2307 02/13/22 0307  NA 139 138  --  135  K 3.0* 2.8*  --  3.2*  CL 104 104  --  106  CO2 24 26  --  22  GLUCOSE 136* 99  --  80  BUN 12 12  --  12  CREATININE 1.54* 1.39*  --  0.98  CALCIUM 8.1* 8.0*  --  8.0*  MG  --   --  1.5* 2.0   GFR: Estimated Creatinine Clearance: 89.9 mL/min (by C-G formula based on SCr of 0.98 mg/dL). Liver Function Tests: Recent Labs  Lab 02/12/22 1430 02/12/22 1721  AST 51* 33  ALT 20 20  ALKPHOS 46 41   BILITOT 1.3* 0.7  PROT 6.7 6.6  ALBUMIN 3.1* 3.0*   No results for input(s): "LIPASE", "AMYLASE" in the last 168 hours. No results for input(s): "AMMONIA" in the last 168 hours. Coagulation Profile: Recent Labs  Lab 02/12/22 1430  INR 1.4*   Cardiac Enzymes: No results for input(s): "CKTOTAL", "CKMB", "CKMBINDEX", "TROPONINI" in the last 168 hours. BNP (last 3 results) No results for input(s): "PROBNP" in the last 8760 hours. HbA1C: No results for input(s): "HGBA1C" in the last 72 hours. CBG: No results for input(s): "GLUCAP" in the last 168 hours. Lipid Profile: No results for input(s): "CHOL", "HDL", "LDLCALC", "TRIG", "CHOLHDL", "LDLDIRECT" in the last 72 hours. Thyroid Function Tests: No results for input(s): "TSH", "T4TOTAL", "FREET4", "T3FREE", "THYROIDAB" in the last 72 hours. Anemia Panel: No results for input(s): "VITAMINB12", "FOLATE", "FERRITIN", "TIBC", "IRON", "RETICCTPCT" in the last 72 hours. Urine analysis: No results found for: "COLORURINE", "APPEARANCEUR", "LABSPEC", "PHURINE", "GLUCOSEU", "HGBUR", "BILIRUBINUR", "KETONESUR", "PROTEINUR", "UROBILINOGEN", "NITRITE", "LEUKOCYTESUR" Sepsis Labs: @LABRCNTIP (procalcitonin:4,lacticidven:4)  )No results found for this or any previous visit (from the past 240 hour(s)).   Radiology Studies: CT Head Wo Contrast  Result Date: 02/12/2022 CLINICAL DATA:  Head trauma.  Coagulopathy EXAM: CT HEAD WITHOUT CONTRAST TECHNIQUE: Contiguous axial images were obtained from the base of the skull through the vertex without intravenous contrast. RADIATION DOSE REDUCTION: This exam was performed according to the departmental dose-optimization program which includes automated exposure control, adjustment of the mA and/or kV according to patient size and/or use of iterative reconstruction technique. COMPARISON:  CT head 01/12/2006 FINDINGS: Brain: Generalized atrophy which has progressed. Negative for hydrocephalus. Patchy hypodensity in  the basal ganglia bilaterally most consistent with chronic ischemia which has progressed. Negative for acute infarct, hemorrhage, mass Vascular: Negative for hyperdense vessel Skull: Negative for skull fracture Sinuses/Orbits: Mild mucosal edema left maxillary sinus. Remaining sinuses clear. Bilateral cataract extraction Other: None IMPRESSION: No acute intracranial abnormality. Atrophy and chronic microvascular ischemia which has progressed since 2007. Electronically Signed   By: Franchot Gallo M.D.   On: 02/12/2022 18:13   DG Chest Port 1 View  Result Date: 02/12/2022 CLINICAL DATA:  Dyspnea EXAM: PORTABLE CHEST 1 VIEW COMPARISON:  Radiograph 06/19/2021 FINDINGS: Mildly enlarged cardiac silhouette, unchanged. Patchy right lower lung airspace opacities. Suspected trace right pleural effusion. No evidence of pneumothorax. No acute osseous abnormality. Thoracic spondylosis. IMPRESSION: Patchy right lower lung airspace disease and suspected trace pleural effusion. Findings could reflect developing pneumonia. Electronically Signed   By: Maurine Simmering M.D.   On: 02/12/2022 14:58     Scheduled Meds:  apixaban  5 mg Oral BID   diltiazem  120 mg Oral Daily   dronedarone  400 mg Oral BID WC   finasteride  5 mg Oral Daily   fluticasone  furoate-vilanterol  1 puff Inhalation Daily   And   umeclidinium bromide  1 puff Inhalation Daily   gabapentin  300 mg Oral QHS   isosorbide mononitrate  30 mg Oral Daily   levalbuterol  0.63 mg Nebulization Q6H   lubiprostone  8 mcg Oral Daily   metoprolol  100 mg Oral Daily   morphine  15 mg Oral Q12H   naloxegol oxalate  12.5 mg Oral q morning   pantoprazole  40 mg Oral QAC breakfast   predniSONE  10 mg Oral Q breakfast   ranolazine  500 mg Oral BID   rosuvastatin  10 mg Oral Daily   senna-docusate  1 tablet Oral BID   sertraline  50 mg Oral Daily   sodium chloride flush  3 mL Intravenous Q12H   tamsulosin  0.4 mg Oral QPC supper   Continuous Infusions:      LOS: 1 day    Time spent: 56min    Domenic Polite, MD [facility]   02/14/2022, 9:30 AM

## 2022-02-14 NOTE — TOC Progression Note (Signed)
Transition of Care [facility]) - Progression Note    Patient Details  Name: Kevin Orozco MRN: [id number] Date of Birth: [birth year]  Transition of [facility]) CM/SW Contact  Zenon Mayo, RN Phone Number: 02/14/2022, 4:13 PM  Clinical Narrative:    from home alone, afib RVR , failed Cardioversion, cxr shows pl effusion. TOC following.        Expected Discharge Plan and Services                                                 Social Determinants of Health (SDOH) Interventions    Readmission Risk Interventions    06/22/2021    1:32 PM  Readmission Risk Prevention Plan  Transportation Screening Complete  PCP or Specialist Appt within 3-5 Days Complete  HRI or [place] Complete  Social Work Consult for [place] Planning/Counseling Complete  Palliative Care Screening Not Applicable  Medication Review [employer]) Complete

## 2022-02-14 NOTE — Consult Note (Signed)
   [facility] Inpatient Consult   02/14/2022  Kevin Orozco 1955-11-18 [id number]  Managed Medicaid: [name]  *High risk Medicaid  Primary Care Provider:  [name], NP  Patient is currently active with [facility] for chronic disease management services. In the Managed Medicaid Team.  Patient has been engaged by a [facility] RN.  Our community based plan of care has focused on disease management and community resource support.  Patient discussed in morning unit progression meeting.  Plan: Follow up with Inpatient Transition Of Care [TOC] team member to make aware that [place] Medicaid team is following.   Of note, [facility]  Managed Medicaid services does not replace or interfere with any services that are needed or arranged by inpatient [facility] care management team.  For additional questions or referrals please contact:  Natividad Brood, RN BSN [place]  [phone] business mobile phone Toll free office [phone]  *[place]  [phone] Fax number: [phone] [email or web address]

## 2022-02-15 ENCOUNTER — Inpatient Hospital Stay (HOSPITAL_COMMUNITY): Payer: Medicaid Other | Admitting: Anesthesiology

## 2022-02-15 ENCOUNTER — Encounter (HOSPITAL_COMMUNITY): Payer: Self-pay | Admitting: Family Medicine

## 2022-02-15 ENCOUNTER — Other Ambulatory Visit (HOSPITAL_COMMUNITY): Payer: Self-pay

## 2022-02-15 ENCOUNTER — Encounter (HOSPITAL_COMMUNITY)
Admission: EM | Disposition: A | Discharge status: Home / Self Care | Payer: Self-pay | Source: Home / Self Care | Attending: Internal Medicine

## 2022-02-15 DIAGNOSIS — I2510 Atherosclerotic heart disease of native coronary artery without angina pectoris: Secondary | ICD-10-CM

## 2022-02-15 DIAGNOSIS — J449 Chronic obstructive pulmonary disease, unspecified: Secondary | ICD-10-CM

## 2022-02-15 DIAGNOSIS — I4891 Unspecified atrial fibrillation: Secondary | ICD-10-CM

## 2022-02-15 DIAGNOSIS — I509 Heart failure, unspecified: Secondary | ICD-10-CM

## 2022-02-15 DIAGNOSIS — I110 Hypertensive heart disease with heart failure: Secondary | ICD-10-CM

## 2022-02-15 DIAGNOSIS — F17210 Nicotine dependence, cigarettes, uncomplicated: Secondary | ICD-10-CM

## 2022-02-15 HISTORY — PX: CARDIOVERSION: SHX1299

## 2022-02-15 LAB — PROTIME-INR
INR: 1.1 (ref 0.8–1.2)
Prothrombin Time: 14.3 seconds (ref 11.4–15.2)

## 2022-02-15 LAB — BASIC METABOLIC PANEL
Anion gap: 5 (ref 5–15)
BUN: 13 mg/dL (ref 8–23)
CO2: 22 mmol/L (ref 22–32)
Calcium: 8.4 mg/dL — ABNORMAL LOW (ref 8.9–10.3)
Chloride: 110 mmol/L (ref 98–111)
Creatinine, Ser: 0.89 mg/dL (ref 0.61–1.24)
GFR, Estimated: >60 mL/min (ref >60)
Glucose, Bld: 105 mg/dL — ABNORMAL HIGH (ref 70–99)
Potassium: 5.1 mmol/L (ref 3.5–5.1)
Sodium: 137 mmol/L (ref 135–145)

## 2022-02-15 LAB — GLUCOSE, CAPILLARY: Glucose-Capillary: 106 mg/dL — ABNORMAL HIGH (ref 70–99)

## 2022-02-15 SURGERY — CARDIOVERSION
Anesthesia: General

## 2022-02-15 MED ORDER — SPIRONOLACTONE 25 MG PO TABS: 25.0000 mg | ORAL_TABLET | Freq: Every day | ORAL | Status: DC

## 2022-02-15 MED ORDER — FUROSEMIDE 10 MG/ML IJ SOLN
40.0000 mg | Freq: Once | INTRAMUSCULAR | Status: AC
  Administered 2022-02-15: 40 mg via INTRAVENOUS
  Filled 2022-02-15: qty 4

## 2022-02-15 MED ORDER — LIDOCAINE 2% (20 MG/ML) 5 ML SYRINGE
INTRAMUSCULAR | Status: DC | PRN
  Administered 2022-02-15: 40 mg via INTRAVENOUS

## 2022-02-15 MED ORDER — SODIUM CHLORIDE 0.9 % IV SOLN: INTRAVENOUS | Status: DC

## 2022-02-15 MED ORDER — DILTIAZEM HCL ER COATED BEADS 120 MG PO CP24
120.0000 mg | ORAL_CAPSULE | Freq: Every day | ORAL | 2 refills | Status: AC
  Filled 2022-02-15: qty 30, 30d supply, fill #0

## 2022-02-15 MED ORDER — SPIRONOLACTONE 25 MG PO TABS
25.0000 mg | ORAL_TABLET | Freq: Every day | ORAL | 1 refills | Status: AC
  Filled 2022-02-15: qty 30, 30d supply, fill #0

## 2022-02-15 MED ORDER — PROPOFOL 10 MG/ML IV BOLUS
INTRAVENOUS | Status: DC | PRN
  Administered 2022-02-15: 80 mg via INTRAVENOUS
  Administered 2022-02-15: 20 mg via INTRAVENOUS

## 2022-02-15 NOTE — Progress Notes (Signed)
PROGRESS NOTE    Kevin Orozco  [id number] DOB: 08-11-1955 DOA: 02/12/2022 PCP: [name], NP  66 y.o. male with history of COPD, chronic pain, narcotic dependence, prior substance abuse, CAD, atrial fibrillation on Eliquis, and anxiety who presented to the emergency department for evaluation of rapid atrial fibrillation. -Developed exertional dyspnea with palpitations X 1 day prior, was seen in pain management clinic 10/24 noted to be in rapid A-fib and referred to the ED, briefly hypotensive given fluid bolus followed by IV diltiazem by EMTs. -In the ED he was emergently cardioverted however returned to rapid A-fib and a few seconds , Chest x-ray notable for patchy right lower lobe airspace disease and suspected trace pleural effusion.  Chemistry panel notable for potassium 3.0 and creatinine 1.54.   [facility] cardiology consulting   Subjective: -Refused early morning labs today, feels fair  Assessment and Plan:  Atrial fibrillation with RVR  - Cardioverted in ED but returned to rapid a fib within seconds  - IV diltiazem load given and gtt., now off, p.o. Cardizem resumed -Continue metoprolol, Multaq and Eliquis -Cards following -Plan for cardioversion today -Later today if stable  Acute on chronic diastolic CHF -Appears mildly volume overloaded, repeat dose of IV Lasix -Last echo 3/23 with preserved EF, moderately reduced RV function -Add low-dose Aldactone   AKI  - SCr is 1.54 on admission, up from apparent baseline of <1  - Likely acute prerenal injury in setting of rapid a fib  -Resolved,    Hypokalemia  -Replaced    COPD  - Continue LAMA-LABA-ICS, daily low-dose prednisone, and as-needed SABA  -Add Xopenex   Chronic pain  Narcotic dependence -Continue MS Contin,  Percocet, and gabapentin followed by pain clinic   Anxiety  - Continue Zoloft and as-needed Klonopin    CAD  - No anginal complaints  - Continue metoprolol, Crestor, Ranexa, Imdur; not on  antiplatelets but on Eliquis     Prolonged QT interval  -Repeat EKG tomorrow, today's labs are pending, mag was normal yesterday  Tobacco abuse -Still smoking, counseled     DVT prophylaxis: Eliquis  Code Status: Full Family Communication: None present Disposition Plan: Home later today if stable  Consultants: Cards   Procedures:   Antimicrobials:    Objective: Vitals:   02/15/22 0755 02/15/22 0842 02/15/22 0844 02/15/22 1219  BP: 129/86   106/71  Pulse: 96   (!) 102  Resp: 17   17  Temp: (!) 97 F (36.1 C)   97.8 F (36.6 C)  TempSrc: Axillary   Oral  SpO2: 98% 98% 98% 96%  Weight:      Height:        Intake/Output Summary (Last 24 hours) at 02/15/2022 1228 Last data filed at 02/15/2022 1217 Gross per 24 hour  Intake 606 ml  Output 2000 ml  Net -1394 ml   Filed Weights   02/12/22 1422 02/15/22 0511  Weight: 93 kg 98.1 kg    Examination:  General exam: Pleasant elderly male sitting up in bed, AAOx3, no distress HEENT: Positive JVD CVS: S1-S2, irregularly irregular rhythm Lungs: Poor air movement bilaterally, decreased breath sounds to bases Abdomen: Soft, nontender, bowel sounds present Extremities: No edema Skin: No rashes Psychiatry: Pleasant and appropriate today    Data Reviewed:   CBC: Recent Labs  Lab 02/12/22 1430 02/13/22 0307  WBC 7.1 7.6  HGB 13.7 12.1*  HCT 40.5 36.8*  MCV 96.7 96.8  PLT 226 [id number]   Basic Metabolic Panel: Recent Labs  Lab 02/12/22 1430 02/12/22 1721 02/12/22 2307 02/13/22 0307 02/15/22 0145  NA 139 138  --  135 137  K 3.0* 2.8*  --  3.2* 5.1  CL 104 104  --  106 110  CO2 24 26  --  22 22  GLUCOSE 136* 99  --  80 105*  BUN 12 12  --  12 13  CREATININE 1.54* 1.39*  --  0.98 0.89  CALCIUM 8.1* 8.0*  --  8.0* 8.4*  MG  --   --  1.5* 2.0  --    GFR: Estimated Creatinine Clearance: 101.4 mL/min (by C-G formula based on SCr of 0.89 mg/dL). Liver Function Tests: Recent Labs  Lab 02/12/22 1430  02/12/22 1721  AST 51* 33  ALT 20 20  ALKPHOS 46 41  BILITOT 1.3* 0.7  PROT 6.7 6.6  ALBUMIN 3.1* 3.0*   No results for input(s): "LIPASE", "AMYLASE" in the last 168 hours. No results for input(s): "AMMONIA" in the last 168 hours. Coagulation Profile: Recent Labs  Lab 02/12/22 1430  INR 1.4*   Cardiac Enzymes: No results for input(s): "CKTOTAL", "CKMB", "CKMBINDEX", "TROPONINI" in the last 168 hours. BNP (last 3 results) No results for input(s): "PROBNP" in the last 8760 hours. HbA1C: No results for input(s): "HGBA1C" in the last 72 hours. CBG: No results for input(s): "GLUCAP" in the last 168 hours. Lipid Profile: No results for input(s): "CHOL", "HDL", "LDLCALC", "TRIG", "CHOLHDL", "LDLDIRECT" in the last 72 hours. Thyroid Function Tests: No results for input(s): "TSH", "T4TOTAL", "FREET4", "T3FREE", "THYROIDAB" in the last 72 hours. Anemia Panel: No results for input(s): "VITAMINB12", "FOLATE", "FERRITIN", "TIBC", "IRON", "RETICCTPCT" in the last 72 hours. Urine analysis: No results found for: "COLORURINE", "APPEARANCEUR", "LABSPEC", "PHURINE", "GLUCOSEU", "HGBUR", "BILIRUBINUR", "KETONESUR", "PROTEINUR", "UROBILINOGEN", "NITRITE", "LEUKOCYTESUR" Sepsis Labs: @LABRCNTIP (procalcitonin:4,lacticidven:4)  )No results found for this or any previous visit (from the past 240 hour(s)).   Radiology Studies: No results found.   Scheduled Meds:  apixaban  5 mg Oral BID   diltiazem  120 mg Oral Daily   dronedarone  400 mg Oral BID WC   finasteride  5 mg Oral Daily   fluticasone furoate-vilanterol  1 puff Inhalation Daily   And   umeclidinium bromide  1 puff Inhalation Daily   gabapentin  300 mg Oral QHS   isosorbide mononitrate  30 mg Oral Daily   levalbuterol  0.63 mg Nebulization TID   lubiprostone  8 mcg Oral Daily   metoprolol  100 mg Oral Daily   morphine  15 mg Oral Q12H   naloxegol oxalate  12.5 mg Oral q morning   pantoprazole  40 mg Oral QAC breakfast    predniSONE  10 mg Oral Q breakfast   ranolazine  500 mg Oral BID   rosuvastatin  10 mg Oral Daily   senna-docusate  1 tablet Oral BID   sertraline  50 mg Oral Daily   sodium chloride flush  3 mL Intravenous Q12H   tamsulosin  0.4 mg Oral QPC supper   Continuous Infusions:     LOS: 2 days    Time spent: 72min    Domenic Polite, MD [facility]   02/15/2022, 12:28 PM

## 2022-02-15 NOTE — Progress Notes (Signed)
   02/15/22 1000  Mobility  Activity Ambulated with assistance in hallway  Level of Assistance Modified independent, requires aide device or extra time  Assistive Device None  Distance Ambulated (ft) 300 ft  Activity Response Tolerated well  Mobility Referral Yes  $Mobility charge 1 Mobility   Mobility Specialist Progress Note  Pre-Mobility: 106 HR, 95%  During Mobility:119 HR, 96% SpO2 Post-Mobility: 117 HR, 95% SpO2  Pt was in bed and agreeable. X3 standing breaks d/t SOB. Returned to bed w/ all needs met and call bell in reach.   Lucious Groves Mobility Specialist

## 2022-02-15 NOTE — Anesthesia Preprocedure Evaluation (Signed)
Anesthesia Evaluation  Patient identified by MRN, date of birth, ID band Patient awake    Reviewed: Allergy & Precautions, H&P , NPO status , Patient's Chart, lab work & pertinent test results  Airway Mallampati: II  TM Distance: >3 FB Neck ROM: Full    Dental no notable dental hx. (+) Edentulous Upper, Edentulous Lower, Dental Advisory Given   Pulmonary COPD,  COPD inhaler, Current Smoker and Patient abstained from smoking.,    Pulmonary exam normal breath sounds clear to auscultation       Cardiovascular hypertension, Pt. on medications and Pt. on home beta blockers + CAD and +CHF  + dysrhythmias Atrial Fibrillation  Rhythm:Irregular Rate:Tachycardia     Neuro/Psych Anxiety negative neurological ROS     GI/Hepatic negative GI ROS, Neg liver ROS,   Endo/Other  diabetes  Renal/GU negative Renal ROS  negative genitourinary   Musculoskeletal   Abdominal   Peds  Hematology negative hematology ROS (+)   Anesthesia Other Findings   Reproductive/Obstetrics negative OB ROS                             Anesthesia Physical Anesthesia Plan  ASA: 3  Anesthesia Plan: General   Post-op Pain Management: Minimal or no pain anticipated   Induction: Intravenous  PONV Risk Score and Plan: 1 and Propofol infusion  Airway Management Planned: Mask  Additional Equipment:   Intra-op Plan:   Post-operative Plan:   Informed Consent: I have reviewed the patients History and Physical, chart, labs and discussed the procedure including the risks, benefits and alternatives for the proposed anesthesia with the patient or authorized representative who has indicated his/her understanding and acceptance.     Dental advisory given  Plan Discussed with: CRNA  Anesthesia Plan Comments:         Anesthesia Quick Evaluation

## 2022-02-15 NOTE — Anesthesia Procedure Notes (Signed)
Procedure Name: General with mask airway Date/Time: 02/15/2022 3:50 PM  Performed by: Dorann Lodge, CRNAPre-anesthesia Checklist: Patient identified, Emergency Drugs available, Suction available and Patient being monitored Patient Re-evaluated:Patient Re-evaluated prior to induction Oxygen Delivery Method: Ambu bag Preoxygenation: Pre-oxygenation with 100% oxygen Induction Type: IV induction Dental Injury: Teeth and Oropharynx as per pre-operative assessment

## 2022-02-15 NOTE — Transfer of Care (Signed)
Immediate Anesthesia Transfer of Care Note  Patient: Kevin Orozco  Procedure(s) Performed: CARDIOVERSION  Patient Location: Endoscopy Unit  Anesthesia Type:General  Level of Consciousness: awake and drowsy  Airway & Oxygen Therapy: Patient Spontanous Breathing  Post-op Assessment: Report given to RN and Post -op Vital signs reviewed and stable  Post vital signs: Reviewed and stable  Last Vitals:  Vitals Value Taken Time  BP 119/82 (93)   Temp    Pulse 70   Resp 18   SpO2 90     Last Pain:  Vitals:   02/15/22 1538  TempSrc: Temporal  PainSc: 0-No pain         Complications: No notable events documented.

## 2022-02-15 NOTE — Plan of Care (Signed)
?  Problem: Education: ?Goal: Knowledge of disease or condition will improve ?Outcome: Adequate for Discharge ?Goal: Understanding of medication regimen will improve ?Outcome: Adequate for Discharge ?Goal: Individualized Educational Video(s) ?Outcome: Adequate for Discharge ?  ?Problem: Activity: ?Goal: Ability to tolerate increased activity will improve ?Outcome: Adequate for Discharge ?  ?Problem: Cardiac: ?Goal: Ability to achieve and maintain adequate cardiopulmonary perfusion will improve ?Outcome: Adequate for Discharge ?  ?Problem: Health Behavior/Discharge Planning: ?Goal: Ability to safely manage health-related needs after discharge will improve ?Outcome: Adequate for Discharge ?  ?Problem: Education: ?Goal: Knowledge of General Education information will improve ?Description: Including pain rating scale, medication(s)/side effects and non-pharmacologic comfort measures ?Outcome: Adequate for Discharge ?  ?Problem: Health Behavior/Discharge Planning: ?Goal: Ability to manage health-related needs will improve ?Outcome: Adequate for Discharge ?  ?Problem: Clinical Measurements: ?Goal: Ability to maintain clinical measurements within normal limits will improve ?Outcome: Adequate for Discharge ?Goal: Will remain free from infection ?Outcome: Adequate for Discharge ?Goal: Diagnostic test results will improve ?Outcome: Adequate for Discharge ?Goal: Respiratory complications will improve ?Outcome: Adequate for Discharge ?Goal: Cardiovascular complication will be avoided ?Outcome: Adequate for Discharge ?  ?Problem: Activity: ?Goal: Risk for activity intolerance will decrease ?Outcome: Adequate for Discharge ?  ?Problem: Nutrition: ?Goal: Adequate nutrition will be maintained ?Outcome: Adequate for Discharge ?  ?Problem: Coping: ?Goal: Level of anxiety will decrease ?Outcome: Adequate for Discharge ?  ?Problem: Elimination: ?Goal: Will not experience complications related to bowel motility ?Outcome: Adequate for  Discharge ?Goal: Will not experience complications related to urinary retention ?Outcome: Adequate for Discharge ?  ?Problem: Pain Managment: ?Goal: General experience of comfort will improve ?Outcome: Adequate for Discharge ?  ?Problem: Safety: ?Goal: Ability to remain free from injury will improve ?Outcome: Adequate for Discharge ?  ?Problem: Skin Integrity: ?Goal: Risk for impaired skin integrity will decrease ?Outcome: Adequate for Discharge ?  ?

## 2022-02-15 NOTE — Progress Notes (Signed)
Subjective:  Patient seen and examined at bedside, resting comfortably.  No acute events overnight.  Patient is still very symptomatic in atrial fibrillation-though he is rate controlled.  Patient is asking for cardioversion prior to going home.  He did eat breakfast this morning so we will have to wait until 4 PM for cardioversion.  Intake/Output from previous day:  I/O last 3 completed shifts: In: 17 [P.O.:600; I.V.:6] Out: 1600 [Urine:1600] Total I/O In: 480 [P.O.:480] Out: 50 [Urine:50] Net IO Since Admission: 689.81 mL [02/15/22 0847]  Blood pressure 129/86, pulse 96, temperature (!) 97 F (36.1 C), temperature source Axillary, resp. rate 17, height _0  (1.803 m), weight 98.1 kg, SpO2 98 %. Physical Exam Vitals reviewed.  Cardiovascular:     Rate and Rhythm: Normal rate. Rhythm irregular.     Pulses: Normal pulses.     Heart sounds: Murmur heard.  Pulmonary:     Effort: Pulmonary effort is normal.     Breath sounds: Normal breath sounds.  Abdominal:     General: Bowel sounds are normal.  Musculoskeletal:     Right lower leg: No edema.     Left lower leg: No edema.  Skin:    General: Skin is warm and dry.  Neurological:     Mental Status: He is alert.     Lab Results: Lab Results  Component Value Date   NA 137 02/15/2022   K 5.1 02/15/2022   CO2 22 02/15/2022   GLUCOSE 105 (H) 02/15/2022   BUN 13 02/15/2022   CREATININE 0.89 02/15/2022   CALCIUM 8.4 (L) 02/15/2022   GFRNONAA >60 02/15/2022    BNP (last 3 results) Recent Labs    02/23/21 1221 06/19/21 2238  BNP 510.6* 708.0*    ProBNP (last 3 results) No results for input(s): "PROBNP" in the last 8760 hours.    Latest Ref Rng & Units 02/15/2022    1:45 AM 02/13/2022    3:07 AM 02/12/2022    5:21 PM  BMP  Glucose 70 - 99 mg/dL 105  80  99   BUN 8 - 23 mg/dL _1 Creatinine 0.61 - 1.24 mg/dL 0.89  0.98  1.39   Sodium 135 - 145 mmol/L 137  135  138   Potassium 3.5 - 5.1 mmol/L 5.1  3.2   2.8   Chloride 98 - 111 mmol/L 110  106  104   CO2 22 - 32 mmol/L _2 Calcium 8.9 - 10.3 mg/dL 8.4  8.0  8.0       Latest Ref Rng & Units 02/12/2022    5:21 PM 02/12/2022    2:30 PM 02/23/2021   12:15 PM  Hepatic Function  Total Protein 6.5 - 8.1 g/dL 6.6  6.7  7.4   Albumin 3.5 - 5.0 g/dL 3.0  3.1  3.7   AST 15 - 41 U/L 33  51  27   ALT 0 - 44 U/L _3 Alk Phosphatase 38 - 126 U/L 41  46  47   Total Bilirubin 0.3 - 1.2 mg/dL 0.7  1.3  1.5       Latest Ref Rng & Units 02/13/2022    3:07 AM 02/12/2022    2:30 PM 06/25/2021    3:29 AM  CBC  WBC 4.0 - 10.5 K/uL 7.6  7.1  6.3   Hemoglobin 13.0 - 17.0 g/dL 12.1  13.7  12.7  Hematocrit 39.0 - 52.0 % 36.8  40.5  37.9   Platelets 150 - 400 K/uL 183  226  151    Lipid Panel     Component Value Date/Time   CHOL 83 06/21/2021 0449   TRIG 37 06/21/2021 0449   HDL 37 (L) 06/21/2021 0449   CHOLHDL 2.2 06/21/2021 0449   VLDL 7 06/21/2021 0449   LDLCALC 39 06/21/2021 0449   Cardiac Panel (last 3 results) No results for input(s): "CKTOTAL", "CKMB", "TROPONINI", "RELINDX" in the last 72 hours.  HEMOGLOBIN A1C Lab Results  Component Value Date   HGBA1C 5.4 09/09/2019   MPG 97 02/22/2015   TSH No results for input(s): "TSH" in the last 8760 hours. Imaging: Imaging results have been reviewed and No results found.   Cardiovascular and other pertinent studies: EKG 06/29/2021: Sinus bradycardia at a rate of 50 bpm.  Normal axis.  Left atrial enlargement.  No evidence of ischemia or underlying injury pattern.  Normal QT.    Echocardiogram 06/20/2021:   1. Left ventricular ejection fraction, by estimation, is 60 to 65%. The left ventricle has normal function. The left ventricle has no regional wall motion abnormalities. There is mild left ventricular hypertrophy. Left ventricular diastolic parameters are indeterminate.   2. Right ventricular systolic function is moderately reduced. The right ventricular size is normal.    3. Left atrial size was severely dilated.   4. Right atrial size was moderately dilated.   5. The mitral valve is grossly normal. Mild mitral valve regurgitation.   6. The aortic valve is grossly normal. Aortic valve regurgitation is trivial.    Direct-current cardioversion in the ED 06/19/2021:  Successful return to normal sinus rhythm with single shock at 100 J   EKG 06/05/2021:  Atrial fibrillation with rapid ventricular response at a rate of 144 bpm.   Direct-current cardioversion in the ED 02/23/2021 Successful return to normal sinus rhythm from atrial fibrillation with RVR.   EKG 02/23/2021: Atrial fibrillation with rapid ventricular response at a rate of 121 bpm.   PCV ECHOCARDIOGRAM COMPLETE 02/13/2021 Left ventricle cavity is normal in size. Moderate concentric hypertrophy of the left ventricle. Normal global wall motion. Normal LV systolic function with visual EF 50-55%. Diastolic function not assessed due to atrial fibrillation. Left atrial cavity is mildly dilated. Trileaflet aortic valve. Moderate aortic valve leaflet calcification. Mild (Grade I) aortic regurgitation. Mild mitral valve leaflet calcification. Mildly restricted mitral valve leaflets without significant stenosis. Moderate (Grade III) mitral regurgitation. Mild to moderate tricuspid regurgitation. Estimated pulmonary artery systolic pressure 29 mmHg. Mild to moderate pulmonic regurgitation. No significant change compared to previous study in 2021.   EKG 07/12/2019: Atrial fibrillation, controlled ventricular rate 90 bpm. Nonspecific T-abnormality.    EKG 12/12/2019: Sinus bradycardia, rate 58 Nonspecific T-abnormality.    Coronary angiography 2012: LM: Normal LAD: Ostial 40-50% stenosis, mid LAD 30% stenosis LCx: OM2 30% stenosis RCA: Mid 20% stenosis and posterior lateral branch   EKG 02/12/2022: Afib RVR, rate 106, prolonged QT-interval  Recent Results (from the past 43800 hour(s))  ECHOCARDIOGRAM  COMPLETE   Collection Time: 06/20/21  2:13 PM  Result Value   Weight 3,280   BP 136/87   S' Lateral 3.00   AR max vel 3.25   AV Area VTI 3.21   AV Mean grad 3.0   AV Peak grad 5.7   Ao pk vel 1.19   P 1/2 time 535   Area-P 1/2 3.53   AV Area mean vel 3.09  Narrative      ECHOCARDIOGRAM REPORT       Patient Name:   Kevin Orozco Date of Exam: 06/20/2021 Medical Rec #:  [id number]      Height:       71.0 in Accession #:    [id number]     Weight:       205.0 lb Date of Birth:  Feb 07, 1956      BSA:          2.131 m Patient Age:    3 years       BP:           132/81 mmHg Patient Gender: M              HR:           67 bpm. Exam Location:  Inpatient  Procedure: 2D Echo  Indications:    Acute respiratory distress   History:        Patient has prior history of Echocardiogram examinations, most                 recent 02/13/2021. CAD; Risk Factors:Diabetes and Hypertension.   Sonographer:    Arlyss Gandy Referring Phys: [id number] CELESTE C CANTWELL    Sonographer Comments: Image acquisition challenging due to patient body habitus and supine. IMPRESSIONS    1. Left ventricular ejection fraction, by estimation, is 60 to 65%. The left ventricle has normal function. The left ventricle has no regional wall motion abnormalities. There is mild left ventricular hypertrophy. Left ventricular diastolic parameters  are indeterminate.  2. Right ventricular systolic function is moderately reduced. The right ventricular size is normal.  3. Left atrial size was severely dilated.  4. Right atrial size was moderately dilated.  5. The mitral valve is grossly normal. Mild mitral valve regurgitation.  6. The aortic valve is grossly normal. Aortic valve regurgitation is trivial.  FINDINGS  Left Ventricle: Left ventricular ejection fraction, by estimation, is 60 to 65%. The left ventricle has normal function. The left ventricle has no regional wall motion abnormalities. The left ventricular  internal cavity size was normal in size. There is  mild left ventricular hypertrophy. Left ventricular diastolic parameters are indeterminate.  Right Ventricle: The right ventricular size is normal. Right vetricular wall thickness was not well visualized. Right ventricular systolic function is moderately reduced.  Left Atrium: Left atrial size was severely dilated.  Right Atrium: Right atrial size was moderately dilated.  Pericardium: There is no evidence of pericardial effusion.  Mitral Valve: The mitral valve is grossly normal. Mild mitral valve regurgitation.  Tricuspid Valve: The tricuspid valve is grossly normal. Tricuspid valve regurgitation is not demonstrated.  Aortic Valve: The aortic valve is grossly normal. Aortic valve regurgitation is trivial. Aortic regurgitation PHT measures 535 msec. Aortic valve mean gradient measures 3.0 mmHg. Aortic valve peak gradient measures 5.7 mmHg. Aortic valve area, by VTI  measures 3.21 cm.  Pulmonic Valve: The pulmonic valve was not well visualized. Pulmonic valve regurgitation is not visualized.  Aorta: The aortic root and ascending aorta are structurally normal, with no evidence of dilitation.  IAS/Shunts: The atrial septum is grossly normal.    LEFT VENTRICLE PLAX 2D LVIDd:         4.30 cm   Diastology LVIDs:         3.00 cm   LV e' medial:    7.72 cm/s LV PW:         1.30 cm   LV E/e' medial:  13.2 LV IVS:        1.30 cm   LV e' lateral:   8.81 cm/s LVOT diam:     2.00 cm   LV E/e' lateral: 11.6 LV SV:         84 LV SV Index:   39 LVOT Area:     3.14 cm    RIGHT VENTRICLE             IVC RV Basal diam:  3.70 cm     IVC diam: 2.70 cm RV Mid diam:    2.90 cm RV S prime:     10.10 cm/s TAPSE (M-mode): 1.2 cm  LEFT ATRIUM              Index        RIGHT ATRIUM           Index LA diam:        4.50 cm  2.11 cm/m   RA Area:     25.20 cm LA Vol (A2C):   120.0 ml 56.32 ml/m  RA Volume:   81.00 ml  38.01 ml/m LA Vol (A4C):    90.8 ml  42.61 ml/m LA Biplane Vol: 106.0 ml 49.75 ml/m  AORTIC VALVE AV Area (Vmax):    3.25 cm AV Area (Vmean):   3.09 cm AV Area (VTI):     3.21 cm AV Vmax:           119.00 cm/s AV Vmean:          84.500 cm/s AV VTI:            0.260 m AV Peak Grad:      5.7 mmHg AV Mean Grad:      3.0 mmHg LVOT Vmax:         123.00 cm/s LVOT Vmean:        83.200 cm/s LVOT VTI:          0.266 m LVOT/AV VTI ratio: 1.02 AI PHT:            535 msec   AORTA Ao Root diam: 3.70 cm Ao Asc diam:  3.80 cm  MITRAL VALVE MV Area (PHT): 3.53 cm     SHUNTS MV Decel Time: 215 msec     Systemic VTI:  0.27 m MV E velocity: 102.00 cm/s  Systemic Diam: 2.00 cm MV A velocity: 74.10 cm/s MV E/A ratio:  1.38  Mertie Moores MD Electronically signed by Mertie Moores MD Signature Date/Time: 06/20/2021/4:07:03 PM       Final     *Note: Due to a large number of results and/or encounters for the requested time period, some results have not been displayed. A complete set of results can be found in Results Review.    Scheduled Meds:  apixaban  5 mg Oral BID   diltiazem  120 mg Oral Daily   dronedarone  400 mg Oral BID WC   finasteride  5 mg Oral Daily   fluticasone furoate-vilanterol  1 puff Inhalation Daily   And   umeclidinium bromide  1 puff Inhalation Daily   gabapentin  300 mg Oral QHS   isosorbide mononitrate  30 mg Oral Daily   levalbuterol  0.63 mg Nebulization TID   lubiprostone  8 mcg Oral Daily   metoprolol  100 mg Oral Daily   morphine  15 mg Oral Q12H   naloxegol oxalate  12.5 mg Oral q morning   pantoprazole  40 mg Oral QAC breakfast  predniSONE  10 mg Oral Q breakfast   ranolazine  500 mg Oral BID   rosuvastatin  10 mg Oral Daily   senna-docusate  1 tablet Oral BID   sertraline  50 mg Oral Daily   sodium chloride flush  3 mL Intravenous Q12H   tamsulosin  0.4 mg Oral QPC supper   Continuous Infusions: PRN Meds:.acetaminophen **OR** acetaminophen, clonazePAM,  ipratropium-albuterol, oxyCODONE-acetaminophen **AND** oxyCODONE, traZODone  Assessment  Shawne Eskelson is a 66 y.o. male patient with   Paroxysmal atrial fibrillation with RVR   COPD, suspect acute exacerbation   AKI   CAD, patient has refused intervention in the past   DM2   Plan:   Regarding atrial fibrillation Rate control:  metoprolol, cardizem Rhythm control: Multaq Thromboembolic prophylaxis: Eliquis CHA2DS2VASc score 4, annual stroke risk 5% Patient had previously maintained normal sinus rhythm since cardioversion 06/19/2021 Maintain on telemetry Optimize electrolytes, K+>4 and Mag>2. Continue breathing treatments. Keep n.p.o., plan for direct-current cardioversion at 4 PM in endoscopy Okay to discharge home if stable and asymptomatic Follow-up with [facility] cardiovascular as outpatient    Floydene Flock, DO, [facility] 02/15/2022, 8:47 AM Office: [phone] Fax: [phone] Pager: [phone]

## 2022-02-15 NOTE — Progress Notes (Signed)
Order to discharge pt home.  Discharge instructions/AVS given to patient and reviewed - education provided as needed.  Medications from TOC delivered to pt. Pt advised to call PCP and/or come back to the hospital if there are any problems. Pt verbalized understanding.

## 2022-02-15 NOTE — Anesthesia Postprocedure Evaluation (Signed)
Anesthesia Post Note  Patient: Kevin Orozco  Procedure(s) Performed: CARDIOVERSION     Patient location during evaluation: Endoscopy Anesthesia Type: General Level of consciousness: awake and alert Pain management: pain level controlled Vital Signs Assessment: post-procedure vital signs reviewed and stable Respiratory status: spontaneous breathing, nonlabored ventilation and respiratory function stable Cardiovascular status: blood pressure returned to baseline and stable Postop Assessment: no apparent nausea or vomiting Anesthetic complications: no   No notable events documented.  Last Vitals:  Vitals:   02/15/22 1620 02/15/22 1625  BP: 105/62 100/67  Pulse: 67 63  Resp: 18 (!) 21  Temp:    SpO2: 93% 96%    Last Pain:  Vitals:   02/15/22 1625  TempSrc:   PainSc: 0-No pain                 Avari Nevares,W. EDMOND

## 2022-02-16 NOTE — CV Procedure (Signed)
Direct current cardioversion 02/15/2022 3:26 PM  Indication symptomatic A. Fibrillation.  Procedure: Using IV Propofol and IV Lidocaine (for reducing venous pain) for achieving deep sedation, synchronized direct current cardioversion performed. Patient was delivered with 200 Joules of electricity X 1 with success to NSR. Patient tolerated the procedure well. No immediate complication noted.   Sabina Custovic, DO

## 2022-02-17 ENCOUNTER — Encounter (HOSPITAL_COMMUNITY): Payer: Self-pay | Admitting: Cardiology

## 2022-02-18 ENCOUNTER — Telehealth: Payer: Self-pay

## 2022-02-18 NOTE — Discharge Summary (Signed)
Physician Discharge Summary  Kevin Orozco [id number] DOB: 1955-10-21 DOA: 02/12/2022  PCP: [name], NP  Admit date: 02/12/2022 Discharge date: 02/15/2022  Time spent: 35 minutes  Recommendations for Outpatient Follow-up:  Cardiology Dr.Custovic in 2 weeks  Discharge Diagnoses:  Principal Problem:   Atrial fibrillation with RVR ([place]) Active Problems:   CAD (coronary artery disease)   Chronic obstructive pulmonary disease (HCC)   Hypokalemia   Anxiety   AKI (acute kidney injury) ([place])   Chronic pain   Prolonged QT interval   A-fib ([place])   Discharge Condition: Improved  Diet recommendation: Low-sodium, heart healthy  Filed Weights   02/12/22 1422 02/15/22 0511 02/15/22 1538  Weight: 93 kg 98.1 kg 98.1 kg    History of present illness:  66 y.o. male with history of COPD, chronic pain, narcotic dependence, prior substance abuse, CAD, atrial fibrillation on Eliquis, and anxiety who presented to the emergency department for evaluation of rapid atrial fibrillation. -Developed exertional dyspnea with palpitations X 1 day prior, was seen in pain management clinic 10/24 noted to be in rapid A-fib and referred to the ED, briefly hypotensive given fluid bolus followed by IV diltiazem by EMTs. -In the ED he was emergently cardioverted however returned to rapid A-fib and a few seconds , Chest x-ray notable for patchy right lower lobe airspace disease and suspected trace pleural effusion.  Chemistry panel notable for potassium 3.0 and creatinine 1.54.   [facility] cardiology consulting  Hospital Course:   Atrial fibrillation with RVR  - Cardioverted in ED but returned to rapid a fib within seconds  - IV diltiazem load given and gtt., now off, p.o. Cardizem resumed -Continue metoprolol, Multaq and Eliquis -Underwent cardioversion by Dr. Charlynne Cousins this afternoon and cleared for discharge home with cardiology follow-up   Acute on chronic diastolic CHF -Given IV Lasix X2 doses,  oral Lasix resumed at discharge -Last echo 3/23 with preserved EF, moderately reduced RV function -Added low-dose Aldactone    AKI  - SCr is 1.54 on admission, up from apparent baseline of <1  - Likely acute prerenal injury in setting of rapid a fib  -Resolved,    Hypokalemia  -Replaced    COPD  - Continue LAMA-LABA-ICS, daily low-dose prednisone, and as-needed SABA  -Add Xopenex   Chronic pain  Narcotic dependence -Continue MS Contin,  Percocet, and gabapentin followed by pain clinic   Anxiety  - Continue Zoloft and as-needed Klonopin    CAD  - No anginal complaints  - Continue metoprolol, Crestor, Ranexa, Imdur; not on antiplatelets but on Eliquis      Tobacco abuse -Still smoking, counseled  Consultations: [place] cardiology  Discharge Exam: Vitals:   02/15/22 1630 02/15/22 1646  BP: 98/68 104/65  Pulse: 68 64  Resp: (!) 23 19  Temp:  97.8 F (36.6 C)  SpO2: 93% 96%   General exam: Pleasant elderly male sitting up in bed, AAOx3, no distress HEENT: Positive JVD CVS: S1-S2, NSR Lungs: Poor air movement bilaterally, decreased breath sounds to bases Abdomen: Soft, nontender, bowel sounds present Extremities: No edema Skin: No rashes Psychiatry: Pleasant and appropriate today  Discharge Instructions    Allergies as of 02/15/2022       Reactions   Etomidate Anxiety   Tape Other (See Comments)   SKIN WILL TEAR EASILY!!!!   Ketamine    Agitation, negative hallucinations (50mg  for sedation)        Medication List     TAKE these medications    Amitiza 8  MCG capsule Generic drug: lubiprostone Take 8 mcg by mouth daily.   apixaban 5 MG Tabs tablet Commonly known as: ELIQUIS Take 1 tablet (5 mg total) by mouth 2 (two) times daily.   clonazePAM 0.5 MG tablet Commonly known as: KLONOPIN Take 0.5-1 mg by mouth 4 (four) times daily as needed for anxiety.   diltiazem 120 MG 24 hr capsule Commonly known as: CARDIZEM CD Take 1 capsule (120 mg  total) by mouth daily.   dronedarone 400 MG tablet Commonly known as: MULTAQ Take 1 tablet (400 mg total) by mouth 2 (two) times daily with a meal.   finasteride 5 MG tablet Commonly known as: PROSCAR Take 5 mg by mouth daily.   furosemide 40 MG tablet Commonly known as: LASIX Take 1 tablet (40 mg total) by mouth 2 (two) times daily.   gabapentin 300 MG capsule Commonly known as: NEURONTIN Take 300 mg by mouth at bedtime.   ipratropium-albuterol 0.5-2.5 (3) MG/3ML Soln Commonly known as: DUONEB Take 3 mLs by nebulization 4 (four) times daily as needed (for shortness of breath or wheezing).   isosorbide mononitrate 30 MG 24 hr tablet Commonly known as: IMDUR Take 1 tablet (30 mg total) by mouth daily.   Kloxxado 8 MG/0.1ML Liqd Generic drug: Naloxone HCl Place 8 mg into the nose as needed (for accidental overdose).   metoprolol 200 MG 24 hr tablet Commonly known as: TOPROL-XL Take 100 mg by mouth daily.   morphine 30 MG 12 hr tablet Commonly known as: MS CONTIN Take 30 mg by mouth every 12 (twelve) hours.   Movantik 12.5 MG Tabs tablet Generic drug: naloxegol oxalate Take 12.5 mg by mouth every morning.   nitroGLYCERIN 0.4 MG SL tablet Commonly known as: NITROSTAT Place under the tongue every 5 (five) minutes as needed for chest pain.   oxyCODONE-acetaminophen 10-325 MG tablet Commonly known as: PERCOCET Take 1 tablet by mouth every 4 (four) hours as needed for pain.   pantoprazole 40 MG tablet Commonly known as: PROTONIX Take 40 mg by mouth daily before breakfast.   predniSONE 10 MG tablet Commonly known as: DELTASONE Take 10 mg by mouth daily with breakfast. Continuous.   ranolazine 500 MG 12 hr tablet Commonly known as: RANEXA TAKE ONE TABLET BY MOUTH TWICE DAILY   rosuvastatin 10 MG tablet Commonly known as: CRESTOR Take 1 tablet (10 mg total) by mouth daily.   sertraline 50 MG tablet Commonly known as: ZOLOFT Take 50 mg by mouth daily.    spironolactone 25 MG tablet Commonly known as: Aldactone Take 1 tablet (25 mg total) by mouth daily.   tamsulosin 0.4 MG Caps capsule Commonly known as: FLOMAX Take 0.4 mg by mouth at bedtime.   traZODone 100 MG tablet Commonly known as: DESYREL Take 100 mg by mouth at bedtime as needed for sleep.   Trelegy Ellipta 200-62.5-25 MCG/ACT Aepb Generic drug: Fluticasone-Umeclidin-Vilant Inhale 1 puff into the lungs daily.   Ventolin HFA 108 (90 Base) MCG/ACT inhaler Generic drug: albuterol Inhale 1-2 puffs into the lungs every 4 (four) hours as needed for wheezing or shortness of breath.       Allergies  Allergen Reactions   Etomidate Anxiety   Tape Other (See Comments)    SKIN WILL TEAR EASILY!!!!   Ketamine     Agitation, negative hallucinations (50mg  for sedation)    Follow-up Information     Custovic, Sabina, DO. Schedule an appointment as soon as possible for a visit in 2 week(s).  Specialty: Cardiology Contact information: [address] [place] [address] [phone]                  The results of significant diagnostics from this hospitalization (including imaging, microbiology, ancillary and laboratory) are listed below for reference.    Significant Diagnostic Studies: CT Head Wo Contrast  Result Date: 02/12/2022 CLINICAL DATA:  Head trauma.  Coagulopathy EXAM: CT HEAD WITHOUT CONTRAST TECHNIQUE: Contiguous axial images were obtained from the base of the skull through the vertex without intravenous contrast. RADIATION DOSE REDUCTION: This exam was performed according to the departmental dose-optimization program which includes automated exposure control, adjustment of the mA and/or kV according to patient size and/or use of iterative reconstruction technique. COMPARISON:  CT head 01/12/2006 FINDINGS: Brain: Generalized atrophy which has progressed. Negative for hydrocephalus. Patchy hypodensity in the basal ganglia bilaterally most  consistent with chronic ischemia which has progressed. Negative for acute infarct, hemorrhage, mass Vascular: Negative for hyperdense vessel Skull: Negative for skull fracture Sinuses/Orbits: Mild mucosal edema left maxillary sinus. Remaining sinuses clear. Bilateral cataract extraction Other: None IMPRESSION: No acute intracranial abnormality. Atrophy and chronic microvascular ischemia which has progressed since 2007. Electronically Signed   By: Franchot Gallo M.D.   On: 02/12/2022 18:13   DG Chest Port 1 View  Result Date: 02/12/2022 CLINICAL DATA:  Dyspnea EXAM: PORTABLE CHEST 1 VIEW COMPARISON:  Radiograph 06/19/2021 FINDINGS: Mildly enlarged cardiac silhouette, unchanged. Patchy right lower lung airspace opacities. Suspected trace right pleural effusion. No evidence of pneumothorax. No acute osseous abnormality. Thoracic spondylosis. IMPRESSION: Patchy right lower lung airspace disease and suspected trace pleural effusion. Findings could reflect developing pneumonia. Electronically Signed   By: Maurine Simmering M.D.   On: 02/12/2022 14:58    Microbiology: No results found for this or any previous visit (from the past 240 hour(s)).   Labs: Basic Metabolic Panel: Recent Labs  Lab 02/12/22 1430 02/12/22 1721 02/12/22 2307 02/13/22 0307 02/15/22 0145  NA 139 138  --  135 137  K 3.0* 2.8*  --  3.2* 5.1  CL 104 104  --  106 110  CO2 24 26  --  22 22  GLUCOSE 136* 99  --  80 105*  BUN 12 12  --  12 13  CREATININE 1.54* 1.39*  --  0.98 0.89  CALCIUM 8.1* 8.0*  --  8.0* 8.4*  MG  --   --  1.5* 2.0  --    Liver Function Tests: Recent Labs  Lab 02/12/22 1430 02/12/22 1721  AST 51* 33  ALT 20 20  ALKPHOS 46 41  BILITOT 1.3* 0.7  PROT 6.7 6.6  ALBUMIN 3.1* 3.0*   No results for input(s): "LIPASE", "AMYLASE" in the last 168 hours. No results for input(s): "AMMONIA" in the last 168 hours. CBC: Recent Labs  Lab 02/12/22 1430 02/13/22 0307  WBC 7.1 7.6  HGB 13.7 12.1*  HCT 40.5 36.8*   MCV 96.7 96.8  PLT 226 183   Cardiac Enzymes: No results for input(s): "CKTOTAL", "CKMB", "CKMBINDEX", "TROPONINI" in the last 168 hours. BNP: BNP (last 3 results) Recent Labs    02/23/21 1221 06/19/21 2238  BNP 510.6* 708.0*    ProBNP (last 3 results) No results for input(s): "PROBNP" in the last 8760 hours.  CBG: Recent Labs  Lab 02/15/22 1645  GLUCAP 106*       Signed:  Domenic Polite MD.  [facility] 02/18/2022, 12:31 PM

## 2022-02-18 NOTE — Telephone Encounter (Signed)
Tried to called patient, a women answered and informed me that the patient passed away last night.

## 2022-03-18 ENCOUNTER — Ambulatory Visit: Payer: Self-pay | Admitting: Obstetrics and Gynecology

## 2022-04-18 ENCOUNTER — Ambulatory Visit: Payer: Medicaid Other | Admitting: Internal Medicine

## 2022-12-27 IMAGING — CT CT ABD-PELV W/ CM
2 of 5 series · 15 of 46 positions shown, 17 images · IV contrast (agent unspecified)
Comparison: Sonogram done on 07/09/2017

CLINICAL DATA: Evaluate for occult malignancy

EXAM:
CT ABDOMEN AND PELVIS WITH CONTRAST
TECHNIQUE: Multidetector CT imaging of the abdomen and pelvis was performed
using the standard protocol following bolus administration of
intravenous contrast.

[Series 3: abd/ pelvis 5.0 i30f 2 · axial · 0.91mm/px · z∈[+896,+1321]mm · 12 of 97 slices shown, 14 images]
[im 6/97  soft-tissue]
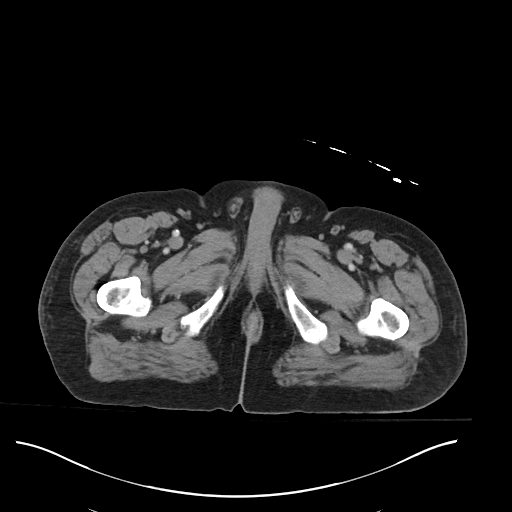
[im 6/97  bone]
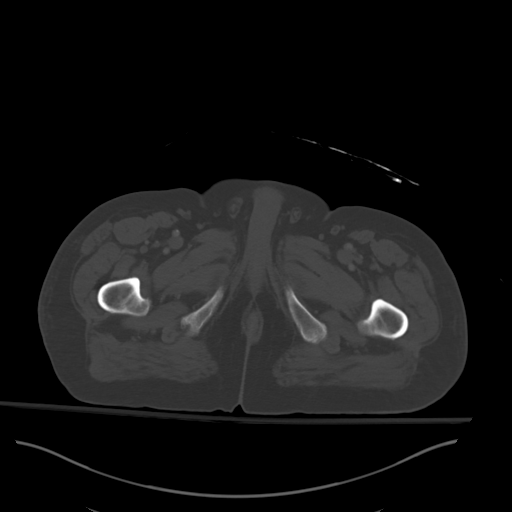
[im 17/97  soft-tissue]
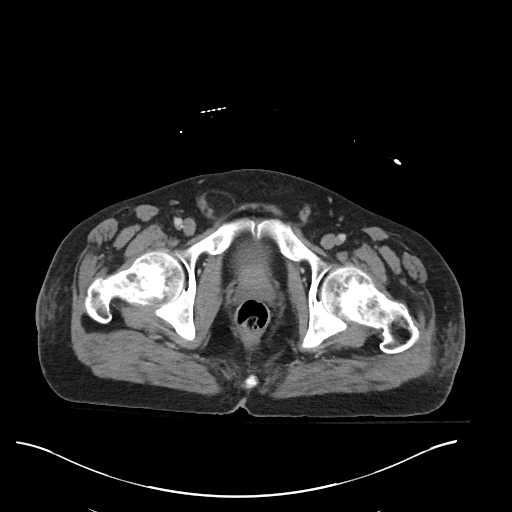
[im 23/97  soft-tissue]
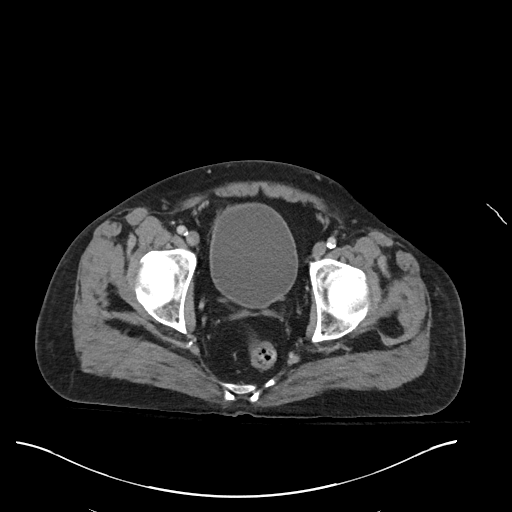
[im 29/97  soft-tissue]
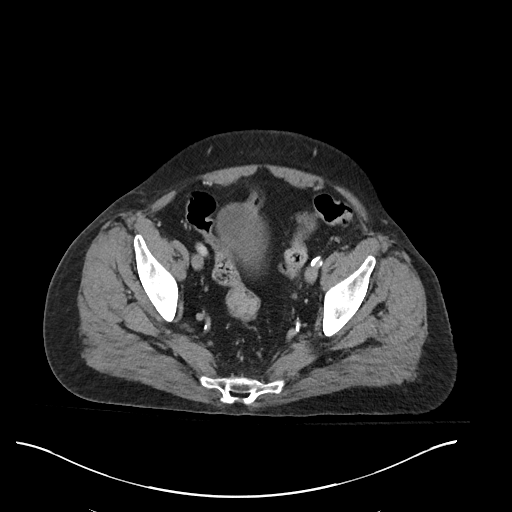
[im 40/97  soft-tissue]
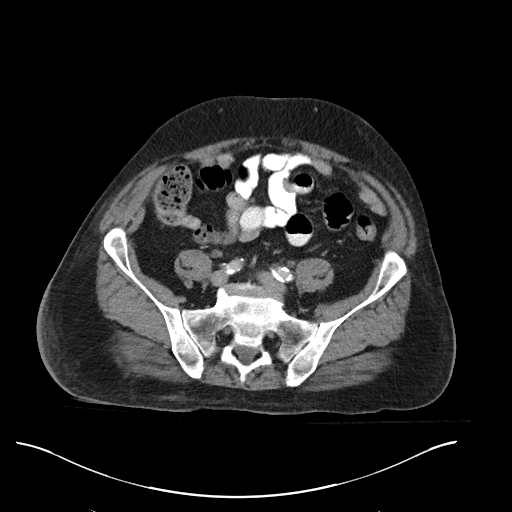
[im 46/97  soft-tissue]
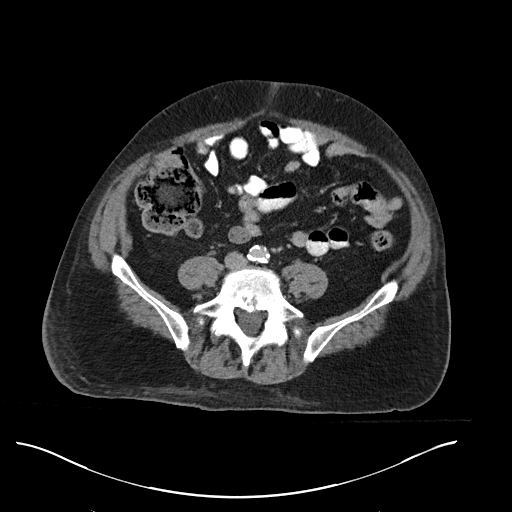
[im 51/97  soft-tissue]
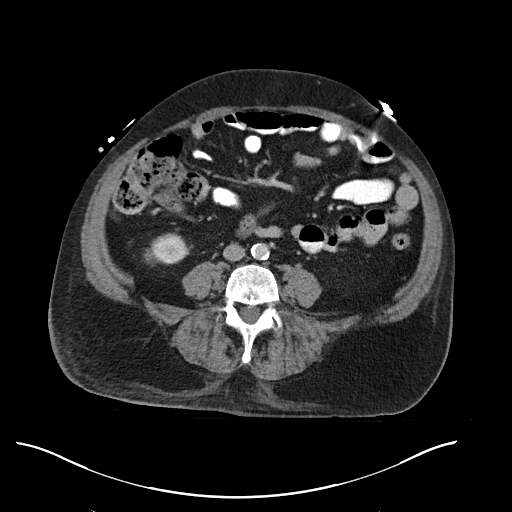
[im 63/97  soft-tissue]
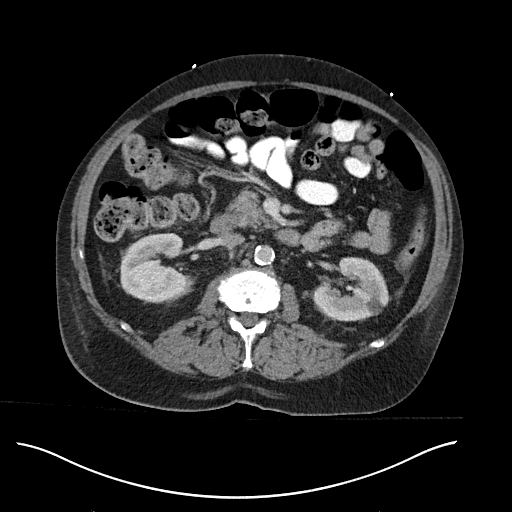
[im 68/97  soft-tissue]
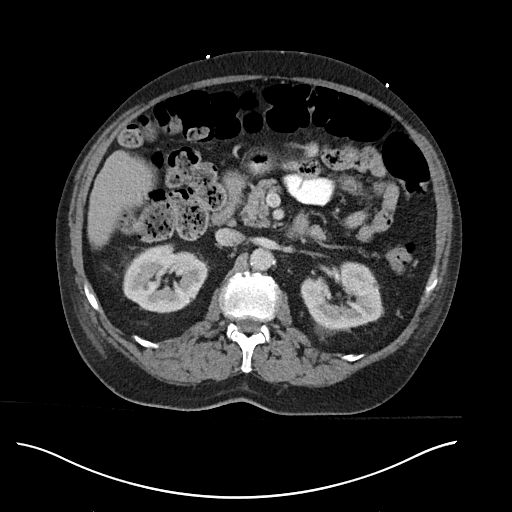
[im 68/97  bone]
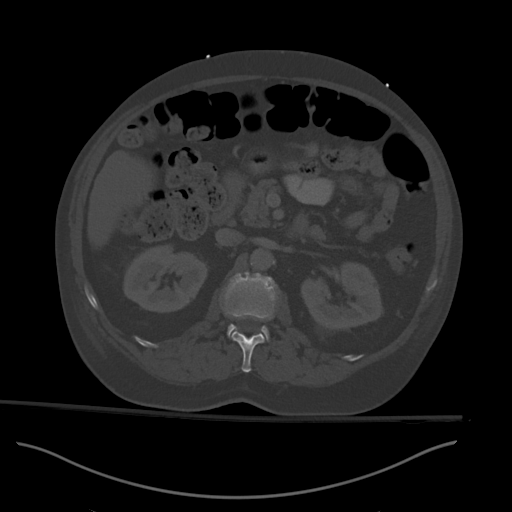
[im 74/97  soft-tissue]
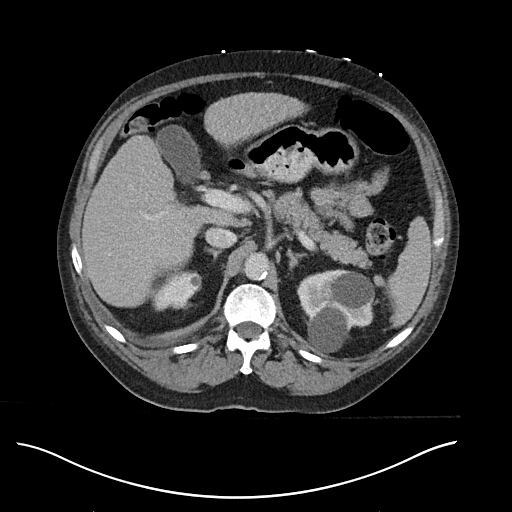
[im 85/97  soft-tissue]
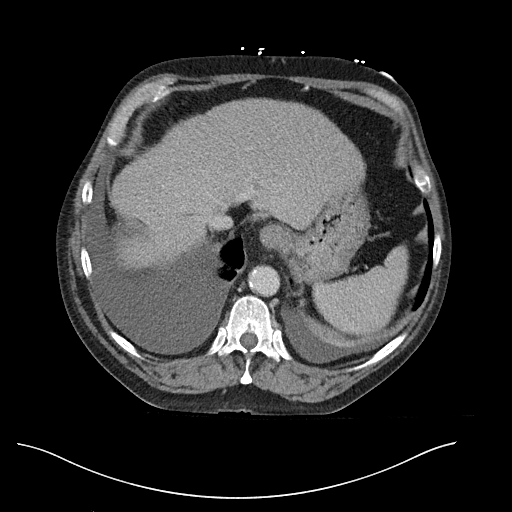
[im 91/97  soft-tissue]
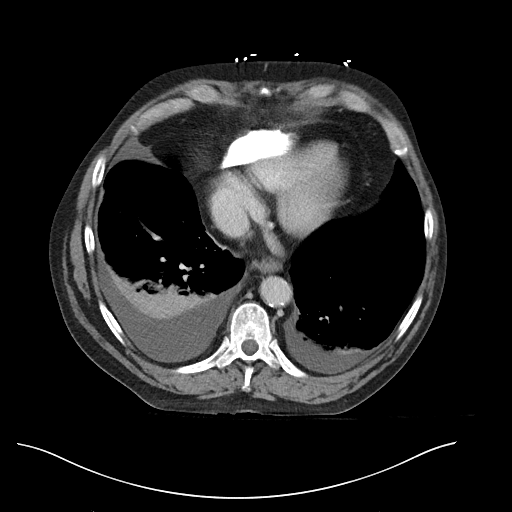

[Series 6: coronal soft tissue · coronal · 0.82mm/px · 3 of 126 slices shown]
[im 42/126  soft-tissue]
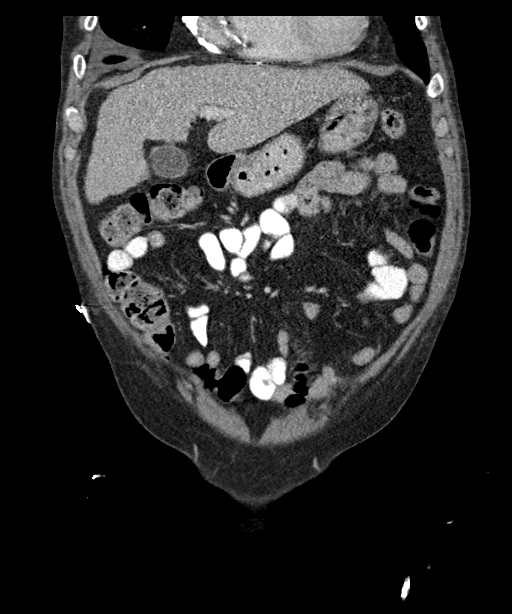
[im 56/126  soft-tissue]
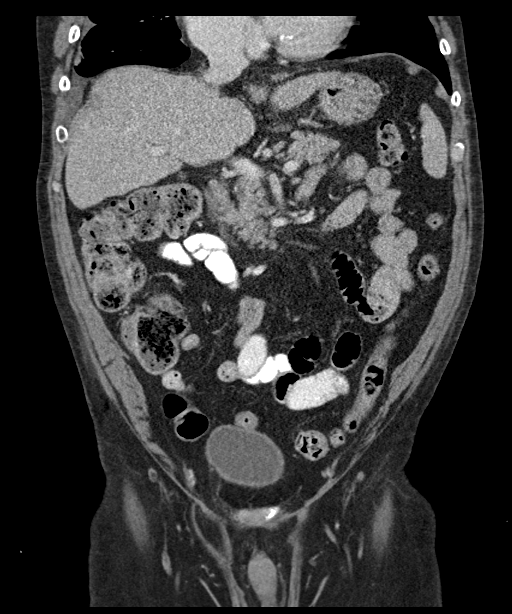
[im 70/126  soft-tissue]
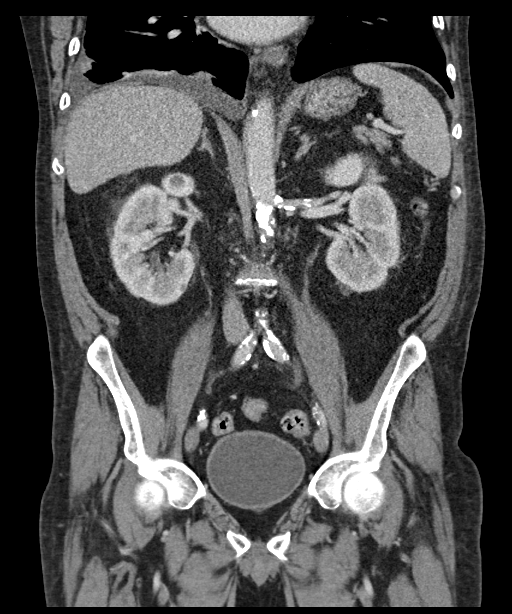

[15 of 46 positions shown; findings below may reference images not displayed]

RADIATION DOSE REDUCTION: This exam was performed according to the
departmental dose-optimization program which includes automated
exposure control, adjustment of the mA and/or kV according to
patient size and/or use of iterative reconstruction technique.

CONTRAST:  100mL OMNIPAQUE IOHEXOL 300 MG/ML  SOLN
FINDINGS: Lower chest: Heart is enlarged in size. Small bilateral pleural
effusions are seen, more so on the right side. There are patchy
infiltrates in the posterior lower lung fields, possibly
atelectasis. Possibility of pneumonia is not excluded. Coronary
artery calcifications are seen. There is dense pericardial
calcification.

Hepatobiliary: There is nodularity in the liver surface. No focal
abnormality is seen. Gallbladder is unremarkable.

Pancreas: No focal abnormality is seen.

Spleen: Unremarkable.

Adrenals/Urinary Tract: Adrenals are not enlarged. There is no
hydronephrosis. There are scattered calcifications in the renal
artery branches. There are multiple smooth marginated low-density
lesions in the renal cortex largest measuring 4.3 cm in the upper
pole of left kidney. Ureters are not dilated. Urinary bladder is
unremarkable.

Stomach/Bowel: Small hiatal hernia is seen. Stomach is not
distended. Small bowel loops are not dilated. Appendix is not seen.
There is no focal pericecal inflammation. There is no significant
wall thickening in colon. There is no pericolic stranding or fluid
collection.

Vascular/Lymphatic: Dense calcifications are seen in the aorta and
its major branches.

Reproductive: Unremarkable.

Other: There is no ascites or pneumoperitoneum. Bilateral inguinal
hernias containing fat are seen, larger on the right side. Umbilical
and paraumbilical hernias containing fat are noted. There is small
ventral hernia containing fat slightly superior to the umbilicus.

Musculoskeletal: Degenerative changes are noted in the lumbar spine
with encroachment of neural foramina at multiple levels, more so at
L4-L5 and L5-S1 levels. Schmorl's nodes are seen in the multiple
vertebral bodies.
IMPRESSION: There is no evidence of intestinal obstruction or pneumoperitoneum.
There is no hydronephrosis.

Bilateral pleural effusions, more so on the right side. Infiltrates
in the both lower lung fields suggest atelectasis or pneumonia.

Nodularity in the liver surface suggests possible cirrhosis. Small
hiatal hernia. Bilateral renal cysts.

Other findings as described in the body of the report.
